# Patient Record
Sex: Male | Born: 1935
Health system: Southern US, Community
[De-identification: ages and names within clinical notes are randomized; demographics above are authoritative.]

## PROBLEM LIST (undated history)

## (undated) DIAGNOSIS — I4891 Unspecified atrial fibrillation: Secondary | ICD-10-CM

## (undated) DIAGNOSIS — M1990 Unspecified osteoarthritis, unspecified site: Secondary | ICD-10-CM

## (undated) DIAGNOSIS — G629 Polyneuropathy, unspecified: Secondary | ICD-10-CM

## (undated) DIAGNOSIS — R0602 Shortness of breath: Secondary | ICD-10-CM

## (undated) DIAGNOSIS — R079 Chest pain, unspecified: Secondary | ICD-10-CM

## (undated) DIAGNOSIS — I214 Non-ST elevation (NSTEMI) myocardial infarction: Secondary | ICD-10-CM

## (undated) DIAGNOSIS — C3490 Malignant neoplasm of unspecified part of unspecified bronchus or lung: Secondary | ICD-10-CM

## (undated) DIAGNOSIS — I219 Acute myocardial infarction, unspecified: Secondary | ICD-10-CM

## (undated) DIAGNOSIS — I2510 Atherosclerotic heart disease of native coronary artery without angina pectoris: Secondary | ICD-10-CM

## (undated) DIAGNOSIS — E538 Deficiency of other specified B group vitamins: Secondary | ICD-10-CM

## (undated) DIAGNOSIS — E785 Hyperlipidemia, unspecified: Secondary | ICD-10-CM

## (undated) DIAGNOSIS — N189 Chronic kidney disease, unspecified: Secondary | ICD-10-CM

## (undated) DIAGNOSIS — N400 Enlarged prostate without lower urinary tract symptoms: Secondary | ICD-10-CM

## (undated) DIAGNOSIS — I10 Essential (primary) hypertension: Secondary | ICD-10-CM

## (undated) DIAGNOSIS — C801 Malignant (primary) neoplasm, unspecified: Secondary | ICD-10-CM

## (undated) DIAGNOSIS — R42 Dizziness and giddiness: Secondary | ICD-10-CM

## (undated) DIAGNOSIS — I739 Peripheral vascular disease, unspecified: Secondary | ICD-10-CM

## (undated) HISTORY — DX: Dizziness and giddiness: R42

## (undated) HISTORY — DX: Unspecified atrial fibrillation: I48.91

## (undated) HISTORY — DX: Deficiency of other specified B group vitamins: E53.8

## (undated) HISTORY — PX: HERNIA REPAIR: SHX51

## (undated) HISTORY — DX: Malignant neoplasm of unspecified part of unspecified bronchus or lung: C34.90

## (undated) HISTORY — DX: Atherosclerotic heart disease of native coronary artery without angina pectoris: I25.10

## (undated) HISTORY — DX: Polyneuropathy, unspecified: G62.9

## (undated) HISTORY — DX: Chest pain, unspecified: R07.9

## (undated) HISTORY — DX: Benign prostatic hyperplasia without lower urinary tract symptoms: N40.0

## (undated) HISTORY — PX: COLON SURGERY: SHX602

## (undated) HISTORY — DX: Malignant (primary) neoplasm, unspecified: C80.1

## (undated) HISTORY — PX: CORONARY ANGIOPLASTY: SHX604

## (undated) HISTORY — DX: Hyperlipidemia, unspecified: E78.5

## (undated) HISTORY — DX: Chronic kidney disease, unspecified: N18.9

---

## 1983-07-11 HISTORY — PX: SINUS SURGERY WITH INSTATRAK: SHX5215

## 2005-09-07 ENCOUNTER — Inpatient Hospital Stay (HOSPITAL_COMMUNITY): Admission: AD | Admit: 2005-09-07 | Discharge: 2005-09-12 | Payer: Self-pay | Admitting: Cardiology

## 2005-09-07 ENCOUNTER — Ambulatory Visit: Payer: Self-pay | Admitting: Cardiology

## 2005-09-27 ENCOUNTER — Ambulatory Visit: Payer: Self-pay | Admitting: Cardiology

## 2005-10-16 ENCOUNTER — Ambulatory Visit: Payer: Self-pay | Admitting: Cardiology

## 2006-06-13 ENCOUNTER — Ambulatory Visit: Payer: Self-pay | Admitting: Cardiology

## 2006-06-20 ENCOUNTER — Ambulatory Visit: Payer: Self-pay

## 2006-08-28 ENCOUNTER — Ambulatory Visit: Payer: Self-pay | Admitting: Cardiology

## 2006-12-24 ENCOUNTER — Ambulatory Visit: Payer: Self-pay | Admitting: Cardiology

## 2007-02-13 ENCOUNTER — Ambulatory Visit: Payer: Self-pay | Admitting: Cardiology

## 2007-04-23 ENCOUNTER — Ambulatory Visit: Payer: Self-pay | Admitting: Cardiology

## 2007-08-28 ENCOUNTER — Ambulatory Visit: Payer: Self-pay | Admitting: Cardiology

## 2007-08-28 ENCOUNTER — Ambulatory Visit: Payer: Self-pay | Admitting: Internal Medicine

## 2007-12-19 ENCOUNTER — Ambulatory Visit: Payer: Self-pay | Admitting: Cardiology

## 2008-04-21 ENCOUNTER — Ambulatory Visit: Payer: Self-pay | Admitting: Cardiology

## 2008-09-02 ENCOUNTER — Ambulatory Visit: Payer: Self-pay | Admitting: Cardiology

## 2008-09-02 ENCOUNTER — Encounter: Payer: Self-pay | Admitting: Physician Assistant

## 2008-10-05 DIAGNOSIS — R079 Chest pain, unspecified: Secondary | ICD-10-CM

## 2008-10-05 DIAGNOSIS — R42 Dizziness and giddiness: Secondary | ICD-10-CM

## 2008-10-05 DIAGNOSIS — I2510 Atherosclerotic heart disease of native coronary artery without angina pectoris: Secondary | ICD-10-CM

## 2008-10-23 ENCOUNTER — Ambulatory Visit: Payer: Self-pay | Admitting: Cardiology

## 2008-11-27 ENCOUNTER — Ambulatory Visit: Payer: Self-pay | Admitting: Cardiology

## 2008-11-27 DIAGNOSIS — I10 Essential (primary) hypertension: Secondary | ICD-10-CM

## 2008-12-14 ENCOUNTER — Ambulatory Visit: Payer: Self-pay | Admitting: Cardiology

## 2008-12-16 ENCOUNTER — Encounter: Payer: Self-pay | Admitting: Cardiology

## 2008-12-16 LAB — CONVERTED CEMR LAB
Albumin: 3.9 g/dL (ref 3.5–5.2)
BUN: 29 mg/dL — ABNORMAL HIGH (ref 6–23)
Bilirubin, Direct: 0 mg/dL (ref 0.0–0.3)
CO2: 28 meq/L (ref 19–32)
Calcium: 9 mg/dL (ref 8.4–10.5)
Chloride: 108 meq/L (ref 96–112)
Cholesterol: 145 mg/dL (ref 0–200)
GFR calc non Af Amer: 56.89 mL/min (ref >60)
HDL: 39 mg/dL — ABNORMAL LOW (ref >39.00)
Hgb A1c MFr Bld: 6 % (ref 4.6–6.5)
LDL Cholesterol: 87 mg/dL (ref 0–99)
Total Protein: 7.3 g/dL (ref 6.0–8.3)
Triglycerides: 97 mg/dL (ref 0.0–149.0)
VLDL: 19.4 mg/dL (ref 0.0–40.0)

## 2009-01-06 ENCOUNTER — Ambulatory Visit: Payer: Self-pay | Admitting: Cardiology

## 2009-05-06 ENCOUNTER — Ambulatory Visit: Payer: Self-pay | Admitting: Cardiology

## 2009-06-11 ENCOUNTER — Encounter (INDEPENDENT_AMBULATORY_CARE_PROVIDER_SITE_OTHER): Payer: Self-pay | Admitting: *Deleted

## 2009-09-01 ENCOUNTER — Ambulatory Visit: Payer: Self-pay | Admitting: Cardiology

## 2009-11-09 ENCOUNTER — Ambulatory Visit: Payer: Self-pay | Admitting: Cardiology

## 2009-11-09 ENCOUNTER — Encounter (INDEPENDENT_AMBULATORY_CARE_PROVIDER_SITE_OTHER): Payer: Self-pay | Admitting: *Deleted

## 2009-11-09 DIAGNOSIS — I447 Left bundle-branch block, unspecified: Secondary | ICD-10-CM | POA: Insufficient documentation

## 2009-11-09 DIAGNOSIS — E785 Hyperlipidemia, unspecified: Secondary | ICD-10-CM | POA: Insufficient documentation

## 2009-11-10 LAB — CONVERTED CEMR LAB
ALT: 16 units/L (ref 0–53)
Albumin: 4.1 g/dL (ref 3.5–5.2)
BUN: 33 mg/dL — ABNORMAL HIGH (ref 6–23)
Basophils Relative: 0.4 % (ref 0.0–3.0)
Bilirubin, Direct: 0.1 mg/dL (ref 0.0–0.3)
CO2: 32 meq/L (ref 19–32)
Chloride: 103 meq/L (ref 96–112)
Cholesterol: 139 mg/dL (ref 0–200)
Creatinine, Ser: 1.8 mg/dL — ABNORMAL HIGH (ref 0.4–1.5)
Eosinophils Relative: 3.2 % (ref 0.0–5.0)
HCT: 45.3 % (ref 39.0–52.0)
HDL: 42.4 mg/dL (ref >39.00)
Hemoglobin: 15.4 g/dL (ref 13.0–17.0)
Lymphs Abs: 1.7 10*3/uL (ref 0.7–4.0)
MCHC: 34 g/dL (ref 30.0–36.0)
MCV: 86.1 fL (ref 78.0–100.0)
Monocytes Absolute: 0.6 10*3/uL (ref 0.1–1.0)
Monocytes Relative: 7.7 % (ref 3.0–12.0)
Neutro Abs: 5.3 10*3/uL (ref 1.4–7.7)
Neutrophils Relative %: 67.5 % (ref 43.0–77.0)
RDW: 13.1 % (ref 11.5–14.6)
Total Protein: 8 g/dL (ref 6.0–8.3)
Triglycerides: 95 mg/dL (ref 0.0–149.0)
VLDL: 19 mg/dL (ref 0.0–40.0)

## 2009-11-24 ENCOUNTER — Ambulatory Visit: Payer: Self-pay | Admitting: Cardiology

## 2009-11-25 ENCOUNTER — Telehealth (INDEPENDENT_AMBULATORY_CARE_PROVIDER_SITE_OTHER): Payer: Self-pay | Admitting: *Deleted

## 2009-11-25 LAB — CONVERTED CEMR LAB
BUN: 30 mg/dL — ABNORMAL HIGH (ref 6–23)
CO2: 29 meq/L (ref 19–32)
Chloride: 105 meq/L (ref 96–112)
Sodium: 141 meq/L (ref 135–145)

## 2009-11-29 ENCOUNTER — Encounter (HOSPITAL_COMMUNITY): Admission: RE | Admit: 2009-11-29 | Discharge: 2010-02-09 | Payer: Self-pay | Admitting: Cardiology

## 2009-11-29 ENCOUNTER — Ambulatory Visit: Payer: Self-pay | Admitting: Cardiology

## 2009-11-29 ENCOUNTER — Ambulatory Visit: Payer: Self-pay

## 2009-12-03 ENCOUNTER — Telehealth: Payer: Self-pay | Admitting: Cardiology

## 2010-01-05 ENCOUNTER — Ambulatory Visit: Payer: Self-pay | Admitting: Cardiology

## 2010-05-05 ENCOUNTER — Ambulatory Visit: Payer: Self-pay | Admitting: Cardiology

## 2010-08-11 NOTE — Assessment & Plan Note (Signed)
Summary: Cardiology Nuclear Study  Nuclear Med Background Indications for Stress Test: Evaluation for Ischemia, Stent Patency   History: Heart Catheterization, Myocardial Perfusion Study, Stents  History Comments: 3/07 Heart cath: EF= 55-60%, residual 50% RCA > Stents: LAD, CFX 12/07 MPS: EF= 57%, Inf. thinning, (-) ischemia     Nuclear Pre-Procedure Cardiac Risk Factors: Family History - CAD, History of Smoking, Hypertension, LBBB, Lipids Caffeine/Decaff Intake: none NPO After: 6:00 PM Lungs: clear IV 0.9% NS with Angio Cath: 18g     IV Site: (R) AC IV Started by: Stanton Kidney EMT-P Chest Size (in) 44     Height (in): 71 Weight (lb): 186 BMI: 26.04 Tech Comments: held metoprolol this am, per Patient.  Nuclear Med Study 1 or 2 day study:  1 day     Stress Test Type:  Adenosine Reading MD:  Willa Rough, MD     Resting Radionuclide:  Technetium 74m Tetrofosmin     Resting Radionuclide Dose:  10.7 mCi  Stress Radionuclide:  Technetium 37m Tetrofosmin     Stress Radionuclide Dose:  33.0 mCi   Stress Protocol  Dose of Adenosine:  47.3 mg    Stress Test Technologist:  Milana Na EMT-P     Nuclear Technologist:  Domenic Polite CNMT  Rest Procedure  Myocardial perfusion imaging was performed at rest 45 minutes following the intravenous administration of Myoview Technetium 1m Tetrofosmin.  Stress Procedure  The patient received IV adenosine at 140 mcg/kg/min for 4 minutes. There were no significant changes with infusion. Myoview was injected at the 2 minute mark and quantitative spect images were obtained after a 45 minute delay.  QPS Raw Data Images:  Patient motion noted; appropriate software correction applied. Stress Images:  No diagnostic abnormalities Rest Images:  Same as stress Subtraction (SDS):  No evidence of ischemia. Transient Ischemic Dilatation:  1.08  (Normal <1.22)  Lung/Heart Ratio:  .34  (Normal <0.45)  Quantitative Gated Spect Images QGS EDV:   119 ml QGS ESV:  55 ml QGS EF:  54 % QGS cine images:  Mild septal dyssynergy from LBBB  Findings Normal nuclear study      Overall Impression  Exercise Capacity: Adenosine study with no exercise. BP Response: Normal blood pressure response. Clinical Symptoms: abdominal cramp ECG Impression: LBBB Overall Impression: Normal stress nuclear study. Overall Impression Comments: No scar or ischemia.  Appended Document: Cardiology Nuclear Study excellent results, no change in treatment.  Appended Document: Cardiology Nuclear Study The referring doctor for this patient is T. Wall. This entry was excluded.  Appended Document: Cardiology Nuclear Study LMTCB./CY

## 2010-08-11 NOTE — Miscellaneous (Signed)
  Clinical Lists Changes  Problems: Added new problem of SCREENING FOR DIABETES MELLITUS (ICD-V77.1) Orders: Added new Test order of TLB-A1C / Hgb A1C (Glycohemoglobin) (83036-A1C) - Signed Added new Test order of TLB-BMP (Basic Metabolic Panel-BMET) (80048-METABOL) - Signed Added new Test order of TLB-CBC Platelet - w/Differential (85025-CBCD) - Signed Added new Test order of TLB-Hepatic/Liver Function Pnl (80076-HEPATIC) - Signed Added new Test order of TLB-Lipid Panel (80061-LIPID) - Signed

## 2010-08-11 NOTE — Miscellaneous (Signed)
  Clinical Lists Changes  Orders: Added new Test order of TLB-BMP (Basic Metabolic Panel-BMET) (80048-METABOL) - Signed Added new Test order of TLB-CBC Platelet - w/Differential (85025-CBCD) - Signed Added new Test order of TLB-Hepatic/Liver Function Pnl (80076-HEPATIC) - Signed Added new Test order of TLB-Lipid Panel (80061-LIPID) - Signed Added new Test order of TLB-A1C / Hgb A1C (Glycohemoglobin) (83036-A1C) - Signed

## 2010-08-11 NOTE — Miscellaneous (Signed)
Summary: Orders Update  Clinical Lists Changes  Orders: Added new Test order of TLB-A1C / Hgb A1C (Glycohemoglobin) (83036-A1C) - Signed Added new Test order of TLB-BMP (Basic Metabolic Panel-BMET) (80048-METABOL) - Signed Added new Test order of TLB-CBC Platelet - w/Differential (85025-CBCD) - Signed Added new Test order of TLB-Hepatic/Liver Function Pnl (80076-HEPATIC) - Signed Added new Test order of TLB-Lipid Panel (80061-LIPID) - Signed

## 2010-08-11 NOTE — Miscellaneous (Signed)
  Clinical Lists Changes  Orders: Added new Referral order of Nuclear Stress Test (Nuc Stress Test) - Signed Added new Service order of EKG w/ Interpretation (93000) - Signed Observations: Added new observation of PI CARDIO: Your physician recommends that you schedule a follow-up appointment in: YEAR WITH DR Janziel Hockett DUE 11/2010 Your physician recommends that you return for lab work in:DAY OF LEXISCAN FASTING BMET LIPID LIVER CBC HGBA1C 272.4 V58.69 Your physician recommends that you continue on your current medications as directed. Please refer to the Current Medication list given to you today. Your physician has requested that you have an LEXISCAN myoview.  For further information please visit [email or web address]  Please follow instruction sheet, as given. (11/09/2009 10:27)      Patient Instructions: 1)  Your physician recommends that you schedule a follow-up appointment in: YEAR WITH DR Debrah Granderson DUE 11/2010 2)  Your physician recommends that you return for lab work in:DAY OF LEXISCAN FASTING BMET LIPID LIVER CBC HGBA1C 272.4 V58.69 3)  Your physician recommends that you continue on your current medications as directed. Please refer to the Current Medication list given to you today. 4)  Your physician has requested that you have an LEXISCAN myoview.  For further information please visit [email or web address]  Please follow instruction sheet, as given.

## 2010-08-11 NOTE — Assessment & Plan Note (Signed)
Summary: CAD/ANAS      Allergies Added: NKDA  Visit Type:  1 yr f/u Primary Provider:  No PCP at this time  CC:  no cardiac complaints today.  History of Present Illness: [name] returns today for evaluation and management of his coronary artery disease, history of PCI of his LAD and circumflex, hypertension, and next hyperlipidemia. He's had a history of some mild glucose intolerance in the past as well but his hemoglobin A1c last year was normal.  He denies any symptoms of angina or dose prior to his PCI in 2007. He is safe with his medications. He does the best he can with diet.  Denies orthopnea, PND or peripheral edema.  Blood work last year showed a hemoglobin A1c of 6%, normal creatinine, cholesterol 145, triglycerides of 97, HDL 39, LDL 87. His liver functions were normal.  His last stress Myoview was in December 2007 after his intervention earlier that year. He is a chronic left bundle.  Clinical Reports Reviewed:  Cardiac Cath:  09/11/2005: Cardiac Cath Findings:   COMPLICATIONS:  None.   IMPRESSION/RECOMMENDATIONS:  Successful revascularization of both the left  anterior descending artery and circumflex using a total of four drug-eluting  stent:  The patient should be maintained on a combination of aspirin and  Plavix for life.   Salvadore Farber, M.D. [facility]  Electronically Signed Cardiac Cath Findings:  PROCEDURE:  Drug-eluting stent placement in both the proximal and mid  circumflex, drug-eluting stent placement in the mid left anterior  descending, intravascular ultrasound of both the circumflex and left  anterior descending, Star Close closure of the right common carotid  arteriotomy site.   INDICATION:  [name] is a 77 year old gentleman, essentially without  past medical history,   Dictation ended at this point.   Salvadore Farber, M.D. [facility]  09/08/2005: Cardiac Cath Findings:  ASSESSMENT:  1.  Three-vessel coronary disease with high-grade  lesions in the left      circumflex and left anterior descending artery.  2.  Normal ejection fraction.   PLAN/DISCUSSION:  I have reviewed the films with Dr. Riley Kill. We will plan  for a two-vessel PCI on Monday. He will need to continue on heparin and  nitroglycerin over the weekend and also need aggressive risk factor  modification.   Arvilla Meres, M.D. [facility]  Electronically Signed  Nuclear Study:  06/20/2006:  Excerise capacity: Adenosine study with no exercise  Blood Pressure response:  Clinical symptoms:  ECG impression: LBBB with rest and stress  Overall impression: Normal stress nuclear study. There is a mild fixed defect which is most consistent with diaphragmatic attenuation. No ischemia. Normal LV fucntion with a mildly dilated ventricle.  Arvilla Meres, MD    Current Medications (verified): 1)  Aspirin Ec 325 Mg Tbec (Aspirin) .... Take One Tablet By Mouth Daily 2)  Plavix 75 Mg Tabs (Clopidogrel Bisulfate) .... Take One Tablet By Mouth Daily 3)  Lisinopril 40 Mg Tabs (Lisinopril) .... Take One Tablet By Mouth Daily 4)  Metoprolol Succinate 50 Mg Xr24h-Tab (Metoprolol Succinate) .... Take One Tablet By Mouth Daily 5)  Nitroglycerin 0.4 Mg Subl (Nitroglycerin) .... One Tablet Under Tongue Every 5 Minutes As Needed For Chest Pain---May Repeat Times Three 6)  Study Drug .... Take As Directed  Allergies (verified): No Known Drug Allergies  Past History:  Past Medical History: Last updated: 10/05/2008 CAD, UNSPECIFIED SITE (ICD-414.00) DIZZINESS (ICD-780.4) CHEST PAIN-UNSPECIFIED (ICD-786.50)  Past Surgical History: Last updated: 10/05/2008 sinus surgery 1985  Family  History: Last updated: 10/05/2008 Mother: died in 62...brain tumor  Father: died 44..MI at age 78  Social History: Last updated: 10/05/2008 Full Time.Johnanna Schne[employer] of the [employer] Single  Tobacco Use - Former. ..quit 1983 Alcohol Use - no Regular Exercise -  yes 3 children  Risk Factors: Exercise: yes (10/05/2008)  Risk Factors: Smoking Status: quit (10/05/2008)  Review of Systems       negative other than history of present illness  Vital Signs:  Patient profile:   77 year old male Height:      71 inches Weight:      185 pounds BMI:     25.90 Pulse rate:   58 / minute Pulse rhythm:   irregular BP sitting:   120 / 70  (left arm) Cuff size:   large  Vitals Entered By: Danielle Rankin, CMA (Nov 09, 2009 9:53 AM)  Physical Exam  General:  Well developed, well nourished, in no acute distress. Head:  normocephalic and atraumatic Eyes:  PERRLA/EOM intact; conjunctiva and lids normal. Neck:  Neck supple, no JVD. No masses, thyromegaly or abnormal cervical nodes. Chest [name]:  no deformities or breast masses noted Lungs:  Clear bilaterally to auscultation and percussion. Heart:  normal PMI, normal S1 paradoxical split S2, regular rate and rhythm Abdomen:  Bowel sounds positive; abdomen soft and non-tender without masses, organomegaly, or hernias noted. No hepatosplenomegaly. Msk:  Back normal, normal gait. Muscle strength and tone normal. Pulses:  pulses normal in all 4 extremities Extremities:  No clubbing or cyanosis. Neurologic:  Alert and oriented x 3. Skin:  Intact without lesions or rashes. Psych:  Normal affect.   Problems:  Medical Problems Added: 1)  Dx of Lbbb  (ICD-426.3) 2)  Dx of Hyperlipidemia-mixed  (ICD-272.4)  EKG  Procedure date:  11/09/2009  Findings:      sinus bradycardia, left bundle branch block  Impression & Recommendations:  Problem # 1:  CAD, UNSPECIFIED SITE (ICD-414.00) I will obtain an adenosine or Lexi scan Myoview. If negative for ischemia, see him back in a year. His updated medication list for this problem includes:    Aspirin Ec 325 Mg Tbec (Aspirin) .Marland Kitchen... Take one tablet by mouth daily    Plavix 75 Mg Tabs (Clopidogrel bisulfate) .Marland Kitchen... Take one tablet by mouth daily    Lisinopril 40 Mg  Tabs (Lisinopril) .Marland Kitchen... Take one tablet by mouth daily    Metoprolol Succinate 50 Mg Xr24h-tab (Metoprolol succinate) .Marland Kitchen... Take one tablet by mouth daily    Nitroglycerin 0.4 Mg Subl (Nitroglycerin) ..... One tablet under tongue every 5 minutes as needed for chest pain---may repeat times three  Orders: EKG w/ Interpretation (93000)  Problem # 2:  HYPERTENSION, BENIGN (ICD-401.1) Assessment: Improved  His updated medication list for this problem includes:    Aspirin Ec 325 Mg Tbec (Aspirin) .Marland Kitchen... Take one tablet by mouth daily    Lisinopril 40 Mg Tabs (Lisinopril) .Marland Kitchen... Take one tablet by mouth daily    Metoprolol Succinate 50 Mg Xr24h-tab (Metoprolol succinate) .Marland Kitchen... Take one tablet by mouth daily  Problem # 3:  HYPERLIPIDEMIA-MIXED (ICD-272.4) Assessment: Unchanged We will check a fasting lipid panel and comprehensive metabolic panel as well as the hemoglobin A1c.  Problem # 4:  LBBB (ICD-426.3) Assessment: Unchanged  His updated medication list for this problem includes:    Aspirin Ec 325 Mg Tbec (Aspirin) .Marland Kitchen... Take one tablet by mouth daily    Plavix 75 Mg Tabs (Clopidogrel bisulfate) .Marland Kitchen... Take one tablet by  mouth daily    Lisinopril 40 Mg Tabs (Lisinopril) .Marland Kitchen... Take one tablet by mouth daily    Metoprolol Succinate 50 Mg Xr24h-tab (Metoprolol succinate) .Marland Kitchen... Take one tablet by mouth daily    Nitroglycerin 0.4 Mg Subl (Nitroglycerin) ..... One tablet under tongue every 5 minutes as needed for chest pain---may repeat times three  Patient Instructions: 1)  Your physician recommends that you schedule a follow-up appointment in:  2)  Your physician recommends that you continue on your current medications as directed. Please refer to the Current Medication list given to you today.

## 2010-08-11 NOTE — Progress Notes (Signed)
Summary: returning call   Phone Note Call from Patient Call back at Home Phone [phone]   Caller: Patient Reason for Call: Talk to Nurse Summary of Call: returning call, aware nurse is not in till 6/1, will wait till then per pts request Initial call taken by: Migdalia Dk,  Dec 03, 2009 3:52 PM  Follow-up for Phone Call        [facility] Scherrie Bateman, LPN  December 09, 979 9:51 AM Pt returning call  Judie Grieve  December 08, 2009 1:44 PM  Additional Follow-up for Phone Call Additional follow up Details #1::        Phone Call Completed. PT AWARE OF MYOVIEW RESULTS. Additional Follow-up by: Scherrie Bateman, LPN,  December 09, 1912 8:14 AM

## 2010-08-11 NOTE — Progress Notes (Signed)
Summary: Nuclear Pre-Procedure  Phone Note Outgoing Call Call back at [facility] Phone [phone]   Call placed by: [name], EMT-P,  Nov 25, 2009 3:08 PM Call placed to: Patient Action Taken: Phone Call Completed Summary of Call: Reviewed information on Myoview Information Sheet (see scanned document for further details).  Spoke with Patient.    Nuclear Med Background Indications for Stress Test: Evaluation for Ischemia, Stent Patency   History: Heart Catheterization, Myocardial Perfusion Study, Stents  History Comments: 3/07 Heart cath: EF= 55-60%, residual 50% RCA > Stents: LAD, CFX 12/07 MPS: EF= 57%, Inf. thinning, (-) ischemia     Nuclear Pre-Procedure Cardiac Risk Factors: Family History - CAD, History of Smoking, Hypertension, LBBB, Lipids Height (in): 71

## 2010-08-30 ENCOUNTER — Encounter (INDEPENDENT_AMBULATORY_CARE_PROVIDER_SITE_OTHER): Payer: Medicare Other

## 2010-08-30 DIAGNOSIS — R0989 Other specified symptoms and signs involving the circulatory and respiratory systems: Secondary | ICD-10-CM

## 2010-11-25 NOTE — Assessment & Plan Note (Signed)
[facility]                            CARDIOLOGY OFFICE NOTE   NAME:[name], [name]                      MRN:          [id number]  DATE:08/28/2006                            DOB:          09/20/1929    Mr. Lia returns today for further management of his coronary artery  disease.   He came in today as well for [facility] in the  IMPROVE-IT study.   He has been having a little bit of dizziness when he stands up or when  he changes position too quick.  In addition, some of his friends have  noticed he has not been quite as gung-ho.   He had a stress Myoview June 20, 2006, which showed an ejection  fraction of 57%.  He had very minimal fixed inferior wall defect, most  consistent with diaphragmatic attenuation.  He had a mildly dilated  ventricle.   His blood pressure today is 130/70, pulse is 55, and he has a left  bundle, which is stable.  His weight is 187.  HEENT:  Normocephalic, atraumatic.  PERRLA.  Extraocular muscles are  intact.  Sclerae clear.  Facial symmetry is normal.  Carotid upstrokes are equal bilaterally without bruits.  There is no  JVD.  Thyroid is not enlarged.  Trachea is midline.  LUNGS:  Clear.  HEART:  Regular rate and rhythm.  There is a paradoxically split S2.  There is no S3 gallop.  ABDOMEN:  Soft with good bowel sounds.  EXTREMITIES:  No cyanosis, clubbing, or edema.  Pulses are intact.  NEURO:  Intact.   EKG confirms sinus brady with a left bundle with a rate of 53 beats per  minute.   ASSESSMENT AND PLAN:  Mr. Frieden is doing well.  I am concerned that he  may be a little bradycardic, and also maybe a little orthostatic.  I  have decreased his Toprol XL to 50 mg a day.  We will plan on seeing him  back in December, 2008.     Thomas C. Daleen Squibb, MD, [facility]  Electronically Signed    TCW/MedQ  DD: 08/28/2006  DT: 08/28/2006  Job #: [id number]

## 2010-11-25 NOTE — Assessment & Plan Note (Signed)
[facility]                            CARDIOLOGY OFFICE NOTE   NAME:[name], [name]                      MRN:          [id number]  DATE:06/13/2006                            DOB:          [birth year]    [name] returns today for further management of his coronary artery  disease. He has been doing remarkably well without any symptoms of  coronary ischemia. He has been active hunting going down to [facility].  He brings pictures today!   He is in the IMPROVE IT study. He has been followed closely by the  research foundation.   His blood pressure has been checked on several occasions and been 120-  130 systolic. He is on lisinopril from our last visit.   He has lost a total of 25 pounds since his index infarct in March 2007.  His hemoglobin A1c was 5.8% in August.   MEDICATIONS:  1. Aspirin 325 a day.  2. Plavix 75 mg a day.  3. Toprol XL 100 mg a day.  4. IMPROVE IT study.  5. Lisinopril 20 mg a day.   VITAL SIGNS:  His blood pressure is 140/68, his pulse 59 and regular,  his weight is 183.  HEENT:  Normocephalic, atraumatic. PERRLA. Extraocular movements intact.  Sclera clear.  LUNGS:  Clear to auscultation and percussion.  HEART:  Reveals a regular rate and rhythm without murmurs, rubs or  gallops.  ABDOMEN:  Soft with good bowel sounds. There is no midline bruit, there  is no hepatomegaly.  EXTREMITIES:  Reveal no cyanosis, clubbing or edema. Pulses are intact.   Electrocardiogram  shows sinus bradycardia at a rate of 59 beats per  minute. He has a left bundle which is old.   I had a nice chat with [name]. He is doing remarkably well. Will  arrange for him to have an exercise rest stress Myoview in January per  his schedule off of Toprol. If this is negative for ischemia, I will see  him back in March which will be a year out from his infarct. At that  time, we will probably stop his Plavix.     Thomas C. Daleen Squibb, MD, [facility]  Electronically Signed   TCW/MedQ  DD: 06/13/2006  DT: 06/14/2006  Job #: [id number]   cc:   [facility]

## 2010-11-25 NOTE — Cardiovascular Report (Signed)
[name], [name]               ACCOUNT NO.:  [id number]   MEDICAL RECORD NO.:  [id number]          PATIENT TYPE:  INP   LOCATION:  2099                         FACILITY:  [name]   PHYSICIAN:  Salvadore Farber, M.D. LHCDATE OF BIRTH:  [birth year]   DATE OF PROCEDURE:  09/11/2005  DATE OF DISCHARGE:                              CARDIAC CATHETERIZATION   PROCEDURE:  Drug-eluting stent placement in both the proximal and mid  circumflex, drug-eluting stent placement in the mid left anterior  descending, intravascular ultrasound of both the circumflex and left  anterior descending, Star Close closure of the right common carotid  arteriotomy site.   INDICATION:  Mr. Maish is a 76 year old gentleman, essentially without  past medical history,   Dictation ended at this point.      Salvadore Farber, M.D. [facility]     WED/MEDQ  D:  09/11/2005  T:  09/12/2005  Job:  [id number]

## 2010-11-25 NOTE — H&P (Signed)
NAME:  [name], [name]               ACCOUNT NO.:  [id number]   MEDICAL RECORD NO.:  [id number]            PATIENT TYPE:   LOCATION:                                 FACILITY:   PHYSICIAN:  Thomas C. Wall, M.D.        DATE OF BIRTH:   DATE OF ADMISSION:  09/07/2005  DATE OF DISCHARGE:                                HISTORY & PHYSICAL   CHIEF COMPLAINT:  Chest tightness with exertion and for a long period of  time over the last week.   HISTORY OF PRESENT ILLNESS:  [name] is a 77 year old white male who has  not seen a doctor in a number of years.   He has enjoyed excellent health.  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   Last week while getting ready for an extravaganza at [facility] he began to  have exertional chest tightness and pressure with some aching in his arm.  Sometimes he would have it for long periods of time, but low-grade.  It  usually got better with rest.   PAST MEDICAL HISTORY:  No known drug allergies.  He quit smoking in 12/06/1981.  He does not drink.   He is on no medications.   He has had sinus surgery in Dec 07, 1983.   His father died of a heart attack in 89 at age 35.   SOCIAL HISTORY:  He is single.  He has three children.  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  He is extremely active.   REVIEW OF SYSTEMS:  Negative.   PHYSICAL EXAMINATION:  VITAL SIGNS:  Blood pressure 156/84, pulse 82 and  regular.  His weight is 195.  He is 5 feet 11 inches.  HEENT:  He has some mild arcus senilis.  PERRLA.  Extraocular movements  intact.  Facial symmetry is normal.  Dentition is stable.  Carotid upstrokes  are equal bilaterally without bruits.  No JVD.  Thyroid is not enlarged.  Trachea is midline.  LUNGS:  Clear.  SKIN:  He has a keloid over his sternum from a previous scar.  CARDIAC:  Normal S1, S2 with a paradoxical split S2.  ABDOMEN:  Soft.  There is no midline bruit.  There is  liver edge palpable  with deep inspiration, but no tenderness.  There is no splenomegaly.  EXTREMITIES:  No clubbing, cyanosis, edema.  Pulses are brisk.  NEUROLOGIC:  Intact.   EKG shows sinus rhythm with a left bundle.   ASSESSMENT:  1.  Unstable angina versus a non Q-wave infarction.  2.  Hypertension.   PLAN:  1.  Admit to [facility] to telemetry or stepdown.  2.  PA and lateral chest x-ray.  3.  Cardiac enzymes x3.  4.  IV heparin, beta blockade, aspirin, and low-dose IV nitroglycerin.  5.  Check fasting lipids and other blood work.  6.  Cardiac catheterization tomorrow.  I have discussed this with the      patient and he agrees with the plan to proceed.  Thomas C. Wall, M.D.  Electronically Signed     TCW/MEDQ  D:  09/07/2005  T:  09/07/2005  Job:  [id number]

## 2010-11-25 NOTE — Cardiovascular Report (Signed)
NAMEKUTTER, SCHNEPF               ACCOUNT NO.:  [id number]   MEDICAL RECORD NO.:  [id number]          PATIENT TYPE:  INP   LOCATION:  2029                         FACILITY:  [name]   PHYSICIAN:  Arvilla Meres, M.D. [facility] OF BIRTH:  [birth year]   DATE OF PROCEDURE:  09/08/2005  DATE OF DISCHARGE:                              CARDIAC CATHETERIZATION   CARDIOLOGIST:  Dr. Juanito Doom.   PATIENT IDENTIFICATION:  [name] is a 77 year old male, with a little  past medical history, who experienced an episode of severe substernal chest  pain this past Saturday which persisted for about the 18-24 hours before  resolving. Over the past week, he has had intermittent chest discomfort with  exertion. He was thus admitted and referred for cardiac catheterization. His  troponin was mildly positive about 0.8.   DESCRIPTION OF PROCEDURE:  Risks and benefits of catheterization were  explained to [name]; consent was signed and placed on the chart. A 6-  [place] arterial sheath was placed in right femoral artery using a modified  Seldinger technique. Standard catheters including preformed Judkins JL-4, JR-  4 and angled pigtail were used for procedure. All catheter exchanges were  made over wire. There were no apparent complications. At the end of the  procedure, the patient's arteriotomy site was closed with Angio-Seal closure  device. There was good hemostasis.   PROCEDURES PERFORMED:  1.  Selective coronary angiography.  2.  Left heart cath.  3.  Left ventriculogram.  4.  AngioSeal femoral artery closure.   FINDINGS:  Central aortic pressure is 147/73 with a mean of 104. LV pressure  is 129/6 with an LVEDP of 11. There was no aortic stenosis.   Left main had a 20% stenosis.   LAD was a long vessel which wrapped the apex. It gave off a very large  branching first diagonal and a tiny second diagonal. The mid-LAD at the  takeoff of a small second diagonal and large septal perforator  there was  tubular stenosis which appeared to be 70-80%.   The left circumflex was a moderate-sized vessel. It gave off a moderate-  sized ramus, small to moderate OM-1 and a moderate-sized OM-2. There was a  99% stenosis in the mid-left circumflex prior to the takeoff of the OM-1. In  the proximal portion of the OM-2, there was a 90% stenosis. There was a 50%  stenosis in the distal OM-2.   Right coronary was a large dominant vessel. It gave off a large PDA, one  small PL, and a moderate-sized PL. There was a 50% tubular lesion in the mid-  portion. There was a 30% lesion around the distal bend.   Left ventriculogram done in the RAO position showed an EF of 55-60% with a  question of mild anterior hypokinesis. In the lateral projection, the EF was  60% with no evidence of a lateral wall motion abnormality.   ASSESSMENT:  1.  Three-vessel coronary disease with high-grade lesions in the left      circumflex and left anterior descending artery.  2.  Normal ejection fraction.   PLAN/DISCUSSION:  I have reviewed the films with Dr. Riley Kill. We will plan  for a two-vessel PCI on Monday. He will need to continue on heparin and  nitroglycerin over the weekend and also need aggressive risk factor  modification.      Arvilla Meres, M.D. [facility]  Electronically Signed     DB/MEDQ  D:  09/08/2005  T:  09/10/2005  Job:  [id number]

## 2010-11-25 NOTE — Discharge Summary (Signed)
NAMEBENEN, WEIDA               ACCOUNT NO.:  [id number]   MEDICAL RECORD NO.:  [id number]          PATIENT TYPE:  INP   LOCATION:  6533                         FACILITY:  [name]   PHYSICIAN:  Salvadore Farber, M.D. [facility] OF BIRTH:  12-23-1929   DATE OF ADMISSION:  09/07/2005  DATE OF DISCHARGE:  09/12/2005                                 DISCHARGE SUMMARY   PROCEDURES:  1.  Cardiac catheterization.  2.  Coronary arteriogram.  3.  Left ventriculogram.   PRIMARY DIAGNOSIS:  Non-ST segment elevation myocardial infarction.   SECONDARY DIAGNOSES:  1.  Dyslipidemia with a total cholesterol of 162, triglycerides 78, HDL 31,      LDL 115.  2.  Status post sinus surgery.  3.  Family history of coronary artery disease.  4.  Hypertension.   HOSPITAL COURSE:  [name] is a 77 year old male with no recent medical  care and no recent medical problems.  He had some exertional chest tightness  last week and came to the emergency room, where he was evaluated by Dr. Daleen Squibb  and admitted for further evaluation and treatment.   A cardiac catheterization was performed on September 08, 2005, and it showed  three-vessel coronary artery disease with a 70-80% LAD, 99 and 90%  circumflex, 50% RCA.  His EF was 55-60% with mild anterior hypokinesis.  The  films were evaluated by Dr. Gala Romney and Dr. Riley Kill.  It was felt that he  needed percutaneous intervention to the circumflex and the LAD, and this was  scheduled for September 11, 2005.   Mr. Klahn remained on stable and on September 11, 2005, he had percutaneous  intervention with a drug-eluting stent to the proximal circumflex, two drug-  eluting stents to the midcircumflex and a drug-eluting stent to the LAD.  Intravascular ultrasound was also performed.  All stents were TAXUS stents.  He tolerated the procedure well.   On September 12, 2005, Mr. Balsam was evaluated by Dr. Samule Ohm.  Dr. Samule Ohm feels  that he should be on aspirin and Plavix indefinitely.   He was on Lopressor,  but this was changed to Toprol XL at 100 mg a day.  Because of his  hypercholesterolemia, he was enrolled in the IMPROVE IT study, and he will  be taking that medication.  Mr. Losito is considered stable for discharge  on September 12, 2005, with outpatient follow-up arranged.   DISCHARGE INSTRUCTIONS:  1.  His activity is to be increased gradually per cardiac rehab.  2.  He is to call our office for any problems with the catheterization site.  3.  He is not to drive for three days and not to lift anything for three      weeks.   DISCHARGE MEDICATIONS:  1.  IMPROVE IT study drug three tablets daily q.h.s.  2.  Aspirin 325 mg a day.  3.  Toprol XL 100 mg daily.  4.  Plavix 75 mg a day.  5.  Sublingual nitroglycerin p.r.n.      Theodore Demark, P.A. [facility]      Salvadore Farber, M.D. [facility]  Electronically Signed    RB/MEDQ  D:  09/12/2005  T:  09/13/2005  Job:  [id number]   cc:   Thomas C. Wall, M.D.  [address]  [address]  [place]  [place] [address]

## 2010-11-25 NOTE — Cardiovascular Report (Signed)
[name], [name]               ACCOUNT NO.:  [id number]   MEDICAL RECORD NO.:  [id number]          PATIENT TYPE:  INP   LOCATION:  6533                         FACILITY:  [name]   PHYSICIAN:  Salvadore Farber, M.D. [facility]ATE OF BIRTH:  01-Feb-1930   DATE OF PROCEDURE:  09/11/2005  DATE OF DISCHARGE:  09/12/2005                              CARDIAC CATHETERIZATION   PROCEDURE:  Drug-eluting stent placement in the lesion of the proximal  circumflex.  Placement of two overlapping drug-eluting stent in the lesion  of the mid-circumflex and drug-eluting stent placement in the mid-LAD.   INDICATIONS:  [name] is a 77 year old gentleman with no significant  past medical history who presents with non-ST elevation myocardial  infarction.  Troponin peaked at only 0.8.  He underwent diagnostic  angiography on September 08, 2005 by Dr. Arvilla Meres.  That demonstrated 99%  of the proximal circumflex, long 90% stenosis of the mid-circumflex and a  70% stenosis of the mid-LAD.  The patient was referred for percutaneous  revascularization.   PROCEDURAL TECHNIQUE:  Informed consent was obtained.  Under 1% lidocaine  local anesthesia, a 6-French was placed in the right common femoral artery  using the modified Seldinger technique.  Anticoagulation was initiated with  ReoPro and heparin.  ACT was maintained at greater than 200 seconds.   Attention was first turned to the circumflex.  A CLS 3.5 guide was advanced  over a wire and engaged in the ostium of the left main.  A Prowater wire was  advanced to the distal circumflex without difficulty.  I began by  predilating the proximal circumflex lesion with a 2.0 x 12 mm Maverick for  two inflations of 6 atmospheres.  I then advanced a 2.5 x 16 mm Taxus stent  to the mid-circumflex lesion and deployed it at 12 atmospheres.  I then took  a second 2.5 x 16 mm Taxus and deployed it in the proximal circumflex  lesion, again deploying it at 12  atmospheres.  I then gave intracoronary  nitroglycerin and then postdilated both stents using 2.5 x 15 mm PowerSail  at 14 atmospheres in the midstent and 16 atmospheres in the proximal stent.  There remained severe stenosis distal to the more distal stent.  I treated  this with an overlapping 2.5 x 12 mm Taxus stent, overlapping the distal  segment.  I deployed this at 10 atmospheres.  I then postdilated this using  a 2.5 x 12 mm Quantum at 14 atmospheres distally and 18 atmospheres at the  site of overlap.  Final angiography demonstrated no residual stenosis, no  dissection and TIMI III flow to the distal vasculature.   Attention was then turned to the LAD.  I removed the Prowater wire from the  circumflex and advanced to the distal LAD without difficulty.  To better  assess the severity of the LAD lesion, I performed intravascular ultrasound.  This confirmed the stenosis to be severe.  I then predilated the lesion  using the 2.5 x 12 mm Quantum at 10 atmospheres.  I then stented the lesion  using a 3.0  x 16 mm Taxus deployed at 16 atmospheres.  I postdilated this  stent using a 3.25 x 15 mm Quantum at 16 atmospheres.  I then gave  intracoronary nitroglycerin and proceeded to repeat intravascular  ultrasound.  That demonstrated the stent to be completely expanded and fully  apposed.  Catheter and wire were then removed.   The arteriotomy was closed using a Starclose device.  Complete hemostasis  was obtained.  The patient was then transferred to the holding room in  stable condition having tolerated the procedure well.   COMPLICATIONS:  None.   IMPRESSION/RECOMMENDATIONS:  Successful revascularization of both the left  anterior descending artery and circumflex using a total of four drug-eluting  stent:  The patient should be maintained on a combination of aspirin and  Plavix for life.      Salvadore Farber, M.D. [facility]  Electronically Signed     WED/MEDQ  D:  01/25/2006  T:   01/25/2006  Job:  [id number]

## 2011-02-07 ENCOUNTER — Encounter: Payer: Self-pay | Admitting: Cardiology

## 2011-02-14 ENCOUNTER — Encounter: Payer: Self-pay | Admitting: Cardiology

## 2011-02-14 ENCOUNTER — Ambulatory Visit (INDEPENDENT_AMBULATORY_CARE_PROVIDER_SITE_OTHER): Payer: Medicare Other | Admitting: Cardiology

## 2011-02-14 VITALS — BP 128/63 | HR 57 | Resp 14 | Ht 71.0 in | Wt 189.0 lb

## 2011-02-14 DIAGNOSIS — Z79899 Other long term (current) drug therapy: Secondary | ICD-10-CM

## 2011-02-14 DIAGNOSIS — E785 Hyperlipidemia, unspecified: Secondary | ICD-10-CM

## 2011-02-14 DIAGNOSIS — I447 Left bundle-branch block, unspecified: Secondary | ICD-10-CM

## 2011-02-14 DIAGNOSIS — I10 Essential (primary) hypertension: Secondary | ICD-10-CM

## 2011-02-14 DIAGNOSIS — I2510 Atherosclerotic heart disease of native coronary artery without angina pectoris: Secondary | ICD-10-CM

## 2011-02-14 DIAGNOSIS — Z131 Encounter for screening for diabetes mellitus: Secondary | ICD-10-CM

## 2011-02-14 LAB — HEPATIC FUNCTION PANEL
ALT: 15 U/L (ref 0–53)
Albumin: 4 g/dL (ref 3.5–5.2)
Alkaline Phosphatase: 43 U/L (ref 39–117)
Bilirubin, Direct: 0 mg/dL (ref 0.0–0.3)
Total Protein: 7.4 g/dL (ref 6.0–8.3)

## 2011-02-14 LAB — BASIC METABOLIC PANEL
CO2: 28 mEq/L (ref 19–32)
Calcium: 9 mg/dL (ref 8.4–10.5)
Chloride: 104 mEq/L (ref 96–112)
Glucose, Bld: 80 mg/dL (ref 70–99)
Potassium: 4.4 mEq/L (ref 3.5–5.1)
Sodium: 141 mEq/L (ref 135–145)

## 2011-02-14 LAB — LIPID PANEL
Cholesterol: 142 mg/dL (ref 0–200)
Total CHOL/HDL Ratio: 3

## 2011-02-14 MED ORDER — NITROGLYCERIN 0.4 MG SL SUBL: 0.4000 mg | SUBLINGUAL_TABLET | SUBLINGUAL | Status: DC | PRN

## 2011-02-14 NOTE — Assessment & Plan Note (Signed)
He will have a lipid profile today, comprehensive metabolic profile, and hemoglobin A1c since his blood sugars have been borderline the last couple of times. He is managed keep his weight down to 185-189. At the time of his event, his weight was 212. Continue current medication.

## 2011-02-14 NOTE — Assessment & Plan Note (Signed)
Stable. No change in medical therapy. 

## 2011-02-14 NOTE — Assessment & Plan Note (Signed)
Improved.  No change in treatment 

## 2011-02-14 NOTE — Assessment & Plan Note (Signed)
No change 

## 2011-02-14 NOTE — Patient Instructions (Signed)
Your physician recommends that you have lab work today, cholesterol, cmp and Hemoglobin A1C.  We will call you with your results.  Your physician recommends that you schedule a follow-up appointment in: 1 year with Dr. Daleen Squibb

## 2011-02-14 NOTE — Progress Notes (Signed)
Addended by: Mylo Red F on: 02/14/2011 01:33 PM   Modules accepted: Orders

## 2011-02-14 NOTE — Progress Notes (Signed)
HPI  [name] returns for evaluation and management of his coronary disease, left bundle branch block, hypertension, and hyperlipidemia.  He is having no angina or ischemic symptoms. He denies any palpitations or presyncope. He has no significant dyspnea on exertion.  He is up for blood work. Last couple of times his blood sugar has been borderline. His weight has been stable.  EKG today shows sinus bradycardia with a left bundle branch block.    Past Medical History  Diagnosis Date  . Coronary atherosclerosis of unspecified type of vessel, native or graft   . Dizziness and giddiness   . Chest pain, unspecified     Past Surgical History  Procedure Date  . Sinus surgery with instatrak 1985    Family History  Problem Relation Age of Onset  . Heart attack Father 72    History   Social History  . Marital Status: Divorced    Spouse Name: N/A    Number of Children: 3  . Years of Education: N/A   Occupational History  . Chairman     [employer]   Social History Main Topics  . Smoking status: Former Smoker    Quit date: 07/10/1981  . Smokeless tobacco: Not on file  . Alcohol Use: No  . Drug Use: Not on file  . Sexually Active: Not on file   Other Topics Concern  . Not on file   Social History Narrative   Regular exercise    No Known Allergies  Current Outpatient Prescriptions  Medication Sig Dispense Refill  . aspirin 325 MG EC tablet Take 325 mg by mouth daily.        . clopidogrel (PLAVIX) 75 MG tablet Take 75 mg by mouth daily.        Marland Kitchen lisinopril (PRINIVIL,ZESTRIL) 40 MG tablet Take 40 mg by mouth daily.        . metoprolol (TOPROL-XL) 50 MG 24 hr tablet Take 50 mg by mouth daily.        . nitroGLYCERIN (NITROSTAT) 0.4 MG SL tablet Place 0.4 mg under the tongue every 5 (five) minutes as needed.        . STUDY MEDICATION as directed.          ROS Negative other than HPI.   PE General Appearance: well developed, well nourished  in no acute distress HEENT: symmetrical face, PERRLA, good dentition  Neck: no JVD, thyromegaly, or adenopathy, trachea midline Chest: symmetric without deformity Cardiac: PMI non-displaced, RRR, normal S1, S2, no gallop or murmur Lung: clear to ausculation and percussion Vascular: all pulses full without bruits  Abdominal: nondistended, nontender, good bowel sounds, no HSM, no bruits Extremities: no cyanosis, clubbing or edema, no sign of DVT, no varicosities  Skin: normal color, no rashes Neuro: alert and oriented x 3, non-focal Pysch: normal affect Filed Vitals:   02/14/11 1233  BP: 128/63  Pulse: 57  Resp: 14  Height: 5\' 11"  (1.803 m)  Weight: 189 lb (85.73 kg)    EKG  Labs and Studies Reviewed.   Lab Results  Component Value Date   WBC 7.8 11/09/2009   HGB 15.4 11/09/2009   HCT 45.3 11/09/2009   MCV 86.1 11/09/2009   PLT 223.0 11/09/2009      Chemistry      Component Value Date/Time   NA 141 11/24/2009 0948   K 4.3 11/24/2009 0948   CL 105 11/24/2009 0948   CO2 29 11/24/2009 0948   BUN 30* 11/24/2009 1610  CREATININE 1.5 11/24/2009 0948      Component Value Date/Time   CALCIUM 9.1 11/24/2009 0948   ALKPHOS 50 11/09/2009 1133   AST 20 11/09/2009 1133   ALT 16 11/09/2009 1133   BILITOT 1.1 11/09/2009 1133       Lab Results  Component Value Date   CHOL 139 11/09/2009   CHOL 145 12/14/2008   Lab Results  Component Value Date   HDL 42.40 11/09/2009   HDL 39.00* 12/14/2008   Lab Results  Component Value Date   LDLCALC 78 11/09/2009   LDLCALC 87 12/14/2008   Lab Results  Component Value Date   TRIG 95.0 11/09/2009   TRIG 97.0 12/14/2008   Lab Results  Component Value Date   CHOLHDL 3 11/09/2009   CHOLHDL 4 12/14/2008   Lab Results  Component Value Date   HGBA1C 5.9 11/09/2009   Lab Results  Component Value Date   ALT 16 11/09/2009   AST 20 11/09/2009   ALKPHOS 50 11/09/2009   BILITOT 1.1 11/09/2009   No results found for this basename: TSH

## 2011-02-16 ENCOUNTER — Telehealth: Payer: Self-pay | Admitting: *Deleted

## 2011-02-16 NOTE — Telephone Encounter (Signed)
Pt aware of lab results. Copy mailed to him Mylo Red RN

## 2011-02-16 NOTE — Telephone Encounter (Signed)
Message copied by Theda Belfast on Thu Feb 16, 2011  8:33 AM ------      Message from: Valera Castle C      Created: Thu Feb 16, 2011  8:29 AM       Excellent, no change.

## 2011-03-20 ENCOUNTER — Other Ambulatory Visit: Payer: Self-pay | Admitting: Cardiology

## 2011-04-04 ENCOUNTER — Other Ambulatory Visit: Payer: Self-pay | Admitting: Cardiology

## 2011-05-23 ENCOUNTER — Other Ambulatory Visit: Payer: Self-pay

## 2011-05-23 MED ORDER — CLOPIDOGREL BISULFATE 75 MG PO TABS: 75.0000 mg | ORAL_TABLET | Freq: Every day | ORAL | Status: DC

## 2011-07-18 ENCOUNTER — Telehealth: Payer: Self-pay | Admitting: Cardiology

## 2011-07-18 NOTE — Telephone Encounter (Signed)
New msg He has been sick and had bronchitis. He is still coughing and it has been about two weeks. Please call.

## 2011-07-18 NOTE — Telephone Encounter (Signed)
Mrs Hartis is calling to report that Mr Udell had bronchitis (not diagnosed) around Christmas and is still coughing.  Cough for 2+ weeks.  I recommended that Mr Bracamonte go to Urgent Care.

## 2012-02-16 ENCOUNTER — Encounter: Payer: Self-pay | Admitting: Cardiology

## 2012-02-16 ENCOUNTER — Ambulatory Visit (INDEPENDENT_AMBULATORY_CARE_PROVIDER_SITE_OTHER): Payer: Medicare Other | Admitting: Cardiology

## 2012-02-16 VITALS — BP 158/66 | HR 54 | Ht 71.0 in | Wt 185.0 lb

## 2012-02-16 DIAGNOSIS — I447 Left bundle-branch block, unspecified: Secondary | ICD-10-CM

## 2012-02-16 DIAGNOSIS — R42 Dizziness and giddiness: Secondary | ICD-10-CM

## 2012-02-16 DIAGNOSIS — I2510 Atherosclerotic heart disease of native coronary artery without angina pectoris: Secondary | ICD-10-CM

## 2012-02-16 DIAGNOSIS — E785 Hyperlipidemia, unspecified: Secondary | ICD-10-CM

## 2012-02-16 DIAGNOSIS — I10 Essential (primary) hypertension: Secondary | ICD-10-CM

## 2012-02-16 DIAGNOSIS — R350 Frequency of micturition: Secondary | ICD-10-CM

## 2012-02-16 LAB — CBC WITH DIFFERENTIAL/PLATELET
Basophils Absolute: 0 10*3/uL (ref 0.0–0.1)
Basophils Relative: 0.4 % (ref 0.0–3.0)
Eosinophils Relative: 2.8 % (ref 0.0–5.0)
HCT: 45.5 % (ref 39.0–52.0)
MCHC: 33.3 g/dL (ref 30.0–36.0)
MCV: 87.4 fl (ref 78.0–100.0)
Monocytes Relative: 8.6 % (ref 3.0–12.0)
Neutrophils Relative %: 65.7 % (ref 43.0–77.0)
Platelets: 213 10*3/uL (ref 150.0–400.0)

## 2012-02-16 LAB — HEPATIC FUNCTION PANEL
Alkaline Phosphatase: 49 U/L (ref 39–117)
Bilirubin, Direct: 0.1 mg/dL (ref 0.0–0.3)
Total Protein: 7.4 g/dL (ref 6.0–8.3)

## 2012-02-16 LAB — BASIC METABOLIC PANEL
Creatinine, Ser: 1.3 mg/dL (ref 0.4–1.5)
Glucose, Bld: 100 mg/dL — ABNORMAL HIGH (ref 70–99)
Potassium: 4.2 mEq/L (ref 3.5–5.1)

## 2012-02-16 LAB — LIPID PANEL
Cholesterol: 152 mg/dL (ref 0–200)
HDL: 44.2 mg/dL (ref >39.00)
Total CHOL/HDL Ratio: 3
Triglycerides: 99 mg/dL (ref 0.0–149.0)

## 2012-02-16 LAB — PSA: PSA: 4.39 ng/mL — ABNORMAL HIGH (ref 0.10–4.00)

## 2012-02-16 NOTE — Assessment & Plan Note (Signed)
Doing well. No change in medications. We'll check labs. He is doing well I'll see him back in a year.

## 2012-02-16 NOTE — Progress Notes (Signed)
HPI [name] comes in today for evaluation and management his coronary artery disease.  His daughter and he called me earlier this summer on a Saturday. He was having what sounded like a potential stroke. He was characterized by dizziness and instability in his legs. He was not true vertigo. I advised that time that he be evaluated. He wished to wait it out. It has not recurred.  Denies any angina or ischemic symptoms. He remains very active. He is due to blood work.  Past Medical History  Diagnosis Date  . Coronary atherosclerosis of unspecified type of vessel, native or graft   . Dizziness and giddiness   . Chest pain, unspecified     Current Outpatient Prescriptions  Medication Sig Dispense Refill  . aspirin 325 MG EC tablet Take 325 mg by mouth daily.        . clopidogrel (PLAVIX) 75 MG tablet Take 1 tablet (75 mg total) by mouth daily.  30 tablet  9  . lisinopril (PRINIVIL,ZESTRIL) 40 MG tablet TAKE ONE TABLET EVERY DAY  30 tablet  11  . metoprolol (TOPROL-XL) 50 MG 24 hr tablet TAKE ONE TABLET EACH DAY  30 tablet  12  . nitroGLYCERIN (NITROSTAT) 0.4 MG SL tablet Place 1 tablet (0.4 mg total) under the tongue every 5 (five) minutes as needed.  90 tablet  6  . STUDY MEDICATION as directed.          No Known Allergies  Family History  Problem Relation Age of Onset  . Heart attack Father 17    History   Social History  . Marital Status: Divorced    Spouse Name: N/A    Number of Children: 3  . Years of Education: N/A   Occupational History  . Chairman     [employer]   Social History Main Topics  . Smoking status: Former Smoker    Quit date: 07/10/1981  . Smokeless tobacco: Not on file  . Alcohol Use: No  . Drug Use: Not on file  . Sexually Active: Not on file   Other Topics Concern  . Not on file   Social History Narrative   Regular exercise    ROS ALL NEGATIVE EXCEPT THOSE NOTED IN HPI  PE  General Appearance: well developed, well  nourished in no acute distress HEENT: symmetrical face, PERRLA, good dentition  Neck: no JVD, thyromegaly, or adenopathy, trachea midline Chest: symmetric without deformity Cardiac: PMI non-displaced, RRR, normal S1, paradoxical S2, no gallop or murmur Lung: clear to ausculation and percussion Vascular: all pulses full without bruits  Abdominal: nondistended, nontender, good bowel sounds, no HSM, no bruits Extremities: no cyanosis, clubbing or edema, no sign of DVT, no varicosities  Skin: normal color, no rashes Neuro: alert and oriented x 3, non-focal Pysch: normal affect  EKG Normal sinus rhythm, left bundle branch block BMET    Component Value Date/Time   NA 141 02/14/2011 1336   K 4.4 02/14/2011 1336   CL 104 02/14/2011 1336   CO2 28 02/14/2011 1336   GLUCOSE 80 02/14/2011 1336   BUN 31* 02/14/2011 1336   CREATININE 1.5 02/14/2011 1336   CALCIUM 9.0 02/14/2011 1336   GFRNONAA 48.86 11/24/2009 0948    Lipid Panel     Component Value Date/Time   CHOL 142 02/14/2011 1336   TRIG 139.0 02/14/2011 1336   HDL 42.80 02/14/2011 1336   CHOLHDL 3 02/14/2011 1336   VLDL 27.8 02/14/2011 1336   LDLCALC 71 02/14/2011 1336  CBC    Component Value Date/Time   WBC 7.8 11/09/2009 1133   RBC 5.27 11/09/2009 1133   HGB 15.4 11/09/2009 1133   HCT 45.3 11/09/2009 1133   PLT 223.0 11/09/2009 1133   MCV 86.1 11/09/2009 1133   MCHC 34.0 11/09/2009 1133   RDW 13.1 11/09/2009 1133   LYMPHSABS 1.7 11/09/2009 1133   MONOABS 0.6 11/09/2009 1133   EOSABS 0.2 11/09/2009 1133   BASOSABS 0.0 11/09/2009 1133

## 2012-02-16 NOTE — Patient Instructions (Addendum)
Your physician recommends that you have lab work today:  Lipid, cmp, psa, cbc  Your physician recommends that you continue on your current medications as directed. Please refer to the Current Medication list given to you today.  Your physician wants you to follow-up in: 1 year with Dr. Daleen Squibb. You will receive a reminder letter in the mail two months in advance. If you don't receive a letter, please call our office to schedule the follow-up appointment.

## 2012-02-21 ENCOUNTER — Telehealth: Payer: Self-pay | Admitting: *Deleted

## 2012-02-21 DIAGNOSIS — R972 Elevated prostate specific antigen [PSA]: Secondary | ICD-10-CM

## 2012-02-21 NOTE — Telephone Encounter (Signed)
Message copied by Barrie Folk on Wed Feb 21, 2012  8:30 AM ------      Message from: Valera Castle C      Created: Tue Feb 20, 2012  8:02 AM       Numbers look good except for PSA. Please refer him to urology first available.

## 2012-02-21 NOTE — Telephone Encounter (Signed)
I spoke with pt and he is aware of lab results. He understands urology consult for elevated PSA. Mylo Red RN

## 2012-04-01 ENCOUNTER — Encounter (INDEPENDENT_AMBULATORY_CARE_PROVIDER_SITE_OTHER): Payer: Self-pay

## 2012-04-19 ENCOUNTER — Encounter (INDEPENDENT_AMBULATORY_CARE_PROVIDER_SITE_OTHER): Payer: Self-pay | Admitting: General Surgery

## 2012-04-19 ENCOUNTER — Ambulatory Visit (INDEPENDENT_AMBULATORY_CARE_PROVIDER_SITE_OTHER): Payer: Medicare Other | Admitting: General Surgery

## 2012-04-19 VITALS — BP 148/70 | HR 76 | Temp 98.1°F | Resp 16 | Ht 71.0 in | Wt 185.4 lb

## 2012-04-19 DIAGNOSIS — K4090 Unilateral inguinal hernia, without obstruction or gangrene, not specified as recurrent: Secondary | ICD-10-CM

## 2012-04-19 NOTE — Progress Notes (Signed)
Patient ID: [name], male   DOB: 1930-01-26, 78 y.o.   MRN: [id number]  Chief Complaint  Patient presents with  . Pre-op Exam    eval RIH    HPI [name] is a 78 y.o. male.   HPI  He is referred by Dr. Vernie Ammons for further evaluation and treatment of a right inguinal hernia.  This has been present for a while and at times it uncomfortable-when he coughs or strains.  He is interested in having it repaired if he needs to.  He does have some BPH symptoms.  Past Medical History  Diagnosis Date  . Coronary atherosclerosis of unspecified type of vessel, native or graft s/p stent placement; he is also in a study    . Dizziness and giddiness   . Chest pain, unspecified     Past Surgical History  Procedure Date  . Sinus surgery with instatrak 1985    Family History  Problem Relation Age of Onset  . Heart attack Father 74    Social History History  Substance Use Topics  . Smoking status: Former Smoker    Quit date: 07/10/1981  . Smokeless tobacco: Not on file  . Alcohol Use: No    No Known Allergies  Current Outpatient Prescriptions  Medication Sig Dispense Refill  . aspirin 325 MG EC tablet Take 325 mg by mouth daily.        . clopidogrel (PLAVIX) 75 MG tablet Take 1 tablet (75 mg total) by mouth daily.  30 tablet  9  . lisinopril (PRINIVIL,ZESTRIL) 40 MG tablet TAKE ONE TABLET EVERY DAY  30 tablet  11  . metoprolol (TOPROL-XL) 50 MG 24 hr tablet TAKE ONE TABLET EACH DAY  30 tablet  12  . nitroGLYCERIN (NITROSTAT) 0.4 MG SL tablet Place 1 tablet (0.4 mg total) under the tongue every 5 (five) minutes as needed.  90 tablet  6  . STUDY MEDICATION as directed.          Review of Systems Review of Systems  Respiratory: Negative for shortness of breath.   Cardiovascular: Negative for chest pain.  Gastrointestinal: Negative for diarrhea and constipation.  Genitourinary: Positive for urgency, frequency and difficulty urinating.    Blood pressure 148/70, pulse 76,  temperature 98.1 F (36.7 C), temperature source Temporal, resp. rate 16, height 5\' 11"  (1.803 m), weight 185 lb 6.4 oz (84.097 kg).  Physical Exam Physical Exam  Constitutional: He appears well-developed and well-nourished. No distress.  HENT:  Head: Normocephalic and atraumatic.  Cardiovascular: Normal rate and regular rhythm.   Pulmonary/Chest: Effort normal and breath sounds normal.  Abdominal: Soft. He exhibits no distension and no mass.  Genitourinary:       Right inguinal bulge that is reducible.  No left inguinal bulge.  Musculoskeletal: He exhibits no edema.    Data Reviewed Dr. Margrett Rud note.  Dr. Vern Claude notes.  Assessment    Symptomatic RIH     Plan    We discussed open RIH repair with mesh.  However, I will need to communicate with Dr. Daleen Squibb regarding this before scheduling his surgery.  I have explained the procedure, risks, and aftercare of inguinal hernia repair.  Risks include but are not limited to bleeding, infection, wound problems, anesthesia, recurrence, bladder or intestine injury, urinary retention, testicular dysfunction, chronic pain, mesh problems.  He seems to understand and agrees with the plan.       Makayli Bracken J 04/19/2012, 5:27 PM

## 2012-04-19 NOTE — Patient Instructions (Addendum)
We will call you to schedule your surgery.  [facility] _______[facility]  UMBILICAL OR INGUINAL HERNIA REPAIR: POST OP INSTRUCTIONS  Always review your discharge instruction sheet given to you by the facility where your surgery was performed. IF YOU HAVE DISABILITY OR FAMILY LEAVE FORMS, YOU MUST BRING THEM TO THE OFFICE FOR PROCESSING.   DO NOT GIVE THEM TO YOUR DOCTOR.  1. A  prescription for pain medication may be given to you upon discharge.  Take your pain medication as prescribed, if needed.  If narcotic pain medicine is not needed, then you may take acetaminophen (Tylenol) or ibuprofen (Advil) as needed. 2. Take your usually prescribed medications unless otherwise directed. 3. If you need a refill on your pain medication, please contact your pharmacy.  They will contact our office to request authorization. Prescriptions will not be filled after 5 pm or on week-ends. 4. You should follow a light diet the first 24 hours after arrival home, such as soup and crackers, etc.  Be sure to include lots of fluids daily.  Resume your normal diet the day after surgery. 5. Most patients will experience some swelling and bruising around the umbilicus or in the groin and scrotum.  Ice packs and reclining will help.  Swelling and bruising can take several days to resolve.  6. It is common to experience some constipation if taking pain medication after surgery.  Increasing fluid intake and taking a stool softener (such as Colace) will usually help or prevent this problem from occurring.  A mild laxative (Milk of Magnesia or Miralax) should be taken according to package directions if there are no bowel movements after 48 hours. 7. Unless discharge instructions indicate otherwise, you may remove your bandages 24-48 hours after surgery, and you may shower at that time.  You may have steri-strips (small skin tapes) in place directly over the incision.  These strips should be left on the skin for 7-10  days.  If your surgeon used skin glue on the incision, you may shower in 24 hours.  The glue will flake off over the next 2-3 weeks.  Any sutures or staples will be removed at the office during your follow-up visit. 8. ACTIVITIES:  You may resume regular (light) daily activities beginning the next day-such as daily self-care, walking, climbing stairs-gradually increasing activities as tolerated.  You may have sexual intercourse when it is comfortable.  Refrain from any heavy lifting or straining until approved by your doctor. a. You may drive when you are no longer taking prescription pain medication, you can comfortably wear a seatbelt, and you can safely maneuver your car and apply brakes. b. RETURN TO WORK:  __________________________________________________________ 9. You should see your doctor in the office for a follow-up appointment approximately 2-3 weeks after your surgery.  Make sure that you call for this appointment within a day or two after you arrive home to insure a convenient appointment time. 10. OTHER INSTRUCTIONS:  __________________________________________________________________________________________________________________________________________________________________________________________  WHEN TO CALL YOUR DOCTOR: 1. Fever over 101.5 2. Inability to urinate 3. Nausea and/or vomiting 4. Extreme swelling or bruising 5. Continued bleeding from incision. 6. Increased pain, redness, or drainage from the incision  The clinic staff is available to answer your questions during regular business hours.  Please don't hesitate to call and ask to speak to one of the nurses for clinical concerns.  If you have a medical emergency, go to the nearest emergency room or call 911.  A surgeon from [facility]  is always on call at the hospital   [address], [address], [place], [place]  [address] ?  [address] [address], [place], [place]   [address] [phone] ? [phone]  ? FAX [phone] Web site: [email or web address]

## 2012-04-25 ENCOUNTER — Telehealth (INDEPENDENT_AMBULATORY_CARE_PROVIDER_SITE_OTHER): Payer: Self-pay | Admitting: General Surgery

## 2012-04-25 NOTE — Telephone Encounter (Signed)
[name] from Dr. Vern Claude office called in stating the patient has been cleared for surgery. Advised that the clearance was sent directly to Dr. Abbey Chatters and she was calling to advise. Patient to have [facility] repair.

## 2012-04-26 ENCOUNTER — Telehealth (INDEPENDENT_AMBULATORY_CARE_PROVIDER_SITE_OTHER): Payer: Self-pay

## 2012-04-29 NOTE — Telephone Encounter (Signed)
Close encounter 

## 2012-05-01 ENCOUNTER — Encounter (INDEPENDENT_AMBULATORY_CARE_PROVIDER_SITE_OTHER): Payer: Self-pay | Admitting: General Surgery

## 2012-05-01 NOTE — Progress Notes (Unsigned)
Patient ID: [name], male   DOB: 06/22/1930, 78 y.o.   MRN: [id number] Dr. Daleen Squibb has cleared him from a cardiac standpoint.  He may also have his Aspirin and Plavix stopped.

## 2012-05-22 ENCOUNTER — Other Ambulatory Visit: Payer: Self-pay | Admitting: *Deleted

## 2012-05-22 MED ORDER — LISINOPRIL 40 MG PO TABS: 40.0000 mg | ORAL_TABLET | Freq: Every day | ORAL | Status: DC

## 2012-06-03 ENCOUNTER — Encounter (HOSPITAL_COMMUNITY): Payer: Self-pay | Admitting: Pharmacy Technician

## 2012-06-04 ENCOUNTER — Ambulatory Visit (HOSPITAL_COMMUNITY)
Admission: RE | Admit: 2012-06-04 | Discharge: 2012-06-04 | Disposition: A | Discharge status: Home / Self Care | Payer: Medicare Other | Source: Ambulatory Visit | Attending: Anesthesiology | Admitting: Anesthesiology

## 2012-06-04 ENCOUNTER — Encounter (HOSPITAL_COMMUNITY): Payer: Self-pay

## 2012-06-04 ENCOUNTER — Encounter (HOSPITAL_COMMUNITY)
Admission: RE | Admit: 2012-06-04 | Discharge: 2012-06-04 | Disposition: A | Discharge status: Home / Self Care | Payer: Medicare Other | Source: Ambulatory Visit | Attending: General Surgery | Admitting: General Surgery

## 2012-06-04 DIAGNOSIS — Z01818 Encounter for other preprocedural examination: Secondary | ICD-10-CM | POA: Insufficient documentation

## 2012-06-04 DIAGNOSIS — I10 Essential (primary) hypertension: Secondary | ICD-10-CM | POA: Insufficient documentation

## 2012-06-04 HISTORY — DX: Essential (primary) hypertension: I10

## 2012-06-04 HISTORY — DX: Unspecified osteoarthritis, unspecified site: M19.90

## 2012-06-04 HISTORY — DX: Acute myocardial infarction, unspecified: I21.9

## 2012-06-04 HISTORY — DX: Shortness of breath: R06.02

## 2012-06-04 LAB — CBC WITH DIFFERENTIAL/PLATELET
Basophils Absolute: 0 10*3/uL (ref 0.0–0.1)
Eosinophils Relative: 5 % (ref 0–5)
Lymphocytes Relative: 28 % (ref 12–46)
Neutro Abs: 4.8 10*3/uL (ref 1.7–7.7)
Platelets: 251 10*3/uL (ref 150–400)
RDW: 12.5 % (ref 11.5–15.5)
WBC: 8.4 10*3/uL (ref 4.0–10.5)

## 2012-06-04 LAB — COMPREHENSIVE METABOLIC PANEL
ALT: 15 U/L (ref 0–53)
AST: 18 U/L (ref 0–37)
CO2: 28 mEq/L (ref 19–32)
Calcium: 9.4 mg/dL (ref 8.4–10.5)
Chloride: 100 mEq/L (ref 96–112)
GFR calc non Af Amer: 43 mL/min — ABNORMAL LOW (ref >90)
Sodium: 137 mEq/L (ref 135–145)

## 2012-06-04 LAB — SURGICAL PCR SCREEN
MRSA, PCR: NEGATIVE
Staphylococcus aureus: NEGATIVE

## 2012-06-04 NOTE — Progress Notes (Signed)
Does not have a primary physician Cardiologist - Dr. Daleen Squibb EKG - August 2013- in epic Stress test within last year at dr. Vern Claude office

## 2012-06-04 NOTE — Pre-Procedure Instructions (Signed)
[address]  06/04/2012   Your procedure is scheduled on:  Friday June 14, 2012  Report to [facility] at 5:30 AM.  Call this number if you have problems the morning of surgery: [phone]   Remember:   Do not eat food:After Midnight.    Take these medicines the morning of surgery with A SIP OF WATER: metoprolol, nitroglycerin (if needed), DISCONTINUE ASPIRIN, COUMADIN, PLAVIX, EFFIENT, AND HERBAL MEDICATIONS.    Do not wear jewelry, make-up or nail polish.  Do not wear lotions, powders, or perfumes.  Do not shave 48 hours prior to surgery. Men may shave face and neck.  Do not bring valuables to the hospital.  Contacts, dentures or bridgework may not be worn into surgery.  Leave suitcase in the car. After surgery it may be brought to your room.  For patients admitted to the hospital, checkout time is 11:00 AM the day of discharge.   Patients discharged the day of surgery will not be allowed to drive home.  Name and phone number of your driver: FAMILY / FRIEND  Special Instructions: Shower using CHG 2 nights before surgery and the night before surgery.  If you shower the day of surgery use CHG.  Use special wash - you have one bottle of CHG for all showers.  You should use approximately 1/3 of the bottle for each shower.   Please read over the following fact sheets that you were given: Pain Booklet, Coughing and Deep Breathing, MRSA Information and Surgical Site Infection Prevention

## 2012-06-13 MED ORDER — CEFAZOLIN SODIUM-DEXTROSE 2-3 GM-% IV SOLR
2.0000 g | INTRAVENOUS | Status: AC
  Administered 2012-06-14: 2 g via INTRAVENOUS
  Filled 2012-06-13: qty 50

## 2012-06-14 ENCOUNTER — Ambulatory Visit (HOSPITAL_COMMUNITY): Payer: Medicare Other | Admitting: Anesthesiology

## 2012-06-14 ENCOUNTER — Encounter (HOSPITAL_COMMUNITY): Payer: Self-pay | Admitting: Anesthesiology

## 2012-06-14 ENCOUNTER — Encounter (HOSPITAL_COMMUNITY)
Admission: RE | Disposition: A | Discharge status: Home / Self Care | Payer: Self-pay | Source: Ambulatory Visit | Attending: General Surgery

## 2012-06-14 ENCOUNTER — Ambulatory Visit (HOSPITAL_COMMUNITY)
Admission: RE | Admit: 2012-06-14 | Discharge: 2012-06-14 | Disposition: A | Discharge status: Home / Self Care | Payer: Medicare Other | Source: Ambulatory Visit | Attending: General Surgery | Admitting: General Surgery

## 2012-06-14 DIAGNOSIS — I10 Essential (primary) hypertension: Secondary | ICD-10-CM | POA: Insufficient documentation

## 2012-06-14 DIAGNOSIS — K4090 Unilateral inguinal hernia, without obstruction or gangrene, not specified as recurrent: Secondary | ICD-10-CM | POA: Insufficient documentation

## 2012-06-14 HISTORY — PX: INSERTION OF MESH: SHX5868

## 2012-06-14 HISTORY — PX: INGUINAL HERNIA REPAIR: SHX194

## 2012-06-14 SURGERY — REPAIR, HERNIA, INGUINAL, ADULT
Anesthesia: General | Site: Abdomen | Laterality: Right | Wound class: Clean

## 2012-06-14 MED ORDER — BUPIVACAINE-EPINEPHRINE 0.5% -1:200000 IJ SOLN
INTRAMUSCULAR | Status: DC | PRN
  Administered 2012-06-14: 30 mL

## 2012-06-14 MED ORDER — FENTANYL CITRATE 0.05 MG/ML IJ SOLN
INTRAMUSCULAR | Status: DC | PRN
  Administered 2012-06-14: 125 ug via INTRAVENOUS
  Administered 2012-06-14: 25 ug via INTRAVENOUS

## 2012-06-14 MED ORDER — OXYCODONE HCL 5 MG PO TABS: 5.0000 mg | ORAL_TABLET | Freq: Once | ORAL | Status: DC | PRN

## 2012-06-14 MED ORDER — PROMETHAZINE HCL 25 MG/ML IJ SOLN: 6.2500 mg | INTRAMUSCULAR | Status: DC | PRN

## 2012-06-14 MED ORDER — PROPOFOL 10 MG/ML IV BOLUS
INTRAVENOUS | Status: DC | PRN
  Administered 2012-06-14: 120 mg via INTRAVENOUS

## 2012-06-14 MED ORDER — BUPIVACAINE-EPINEPHRINE (PF) 0.5% -1:200000 IJ SOLN
INTRAMUSCULAR | Status: AC
  Filled 2012-06-14: qty 10

## 2012-06-14 MED ORDER — OXYCODONE HCL 5 MG PO TABS
ORAL_TABLET | ORAL | Status: AC
  Filled 2012-06-14: qty 2

## 2012-06-14 MED ORDER — HYDROMORPHONE HCL PF 1 MG/ML IJ SOLN: 0.2500 mg | INTRAMUSCULAR | Status: DC | PRN

## 2012-06-14 MED ORDER — ACETAMINOPHEN 325 MG PO TABS: 650.0000 mg | ORAL_TABLET | ORAL | Status: DC | PRN

## 2012-06-14 MED ORDER — LACTATED RINGERS IV SOLN
INTRAVENOUS | Status: DC | PRN
  Administered 2012-06-14: 07:00:00 via INTRAVENOUS

## 2012-06-14 MED ORDER — ONDANSETRON HCL 4 MG/2ML IJ SOLN: 4.0000 mg | Freq: Four times a day (QID) | INTRAMUSCULAR | Status: DC | PRN

## 2012-06-14 MED ORDER — OXYCODONE HCL 5 MG PO TABS
5.0000 mg | ORAL_TABLET | ORAL | Status: DC | PRN
  Administered 2012-06-14: 10 mg via ORAL

## 2012-06-14 MED ORDER — OXYCODONE HCL 5 MG/5ML PO SOLN: 5.0000 mg | Freq: Once | ORAL | Status: DC | PRN

## 2012-06-14 MED ORDER — MIDAZOLAM HCL 5 MG/5ML IJ SOLN
INTRAMUSCULAR | Status: DC | PRN
  Administered 2012-06-14 (×2): .5 mg via INTRAVENOUS

## 2012-06-14 MED ORDER — LIDOCAINE HCL (CARDIAC) 20 MG/ML IV SOLN
INTRAVENOUS | Status: DC | PRN
  Administered 2012-06-14: 100 mg via INTRAVENOUS

## 2012-06-14 MED ORDER — ONDANSETRON HCL 4 MG/2ML IJ SOLN
INTRAMUSCULAR | Status: DC | PRN
  Administered 2012-06-14: 4 mg via INTRAVENOUS

## 2012-06-14 MED ORDER — SODIUM CHLORIDE 0.9 % IJ SOLN: 3.0000 mL | INTRAMUSCULAR | Status: DC | PRN

## 2012-06-14 MED ORDER — METOPROLOL TARTRATE 50 MG PO TABS
ORAL_TABLET | ORAL | Status: AC
  Administered 2012-06-14: 50 mg via ORAL
  Filled 2012-06-14: qty 1

## 2012-06-14 MED ORDER — ACETAMINOPHEN 650 MG RE SUPP: 650.0000 mg | RECTAL | Status: DC | PRN

## 2012-06-14 MED ORDER — 0.9 % SODIUM CHLORIDE (POUR BTL) OPTIME
TOPICAL | Status: DC | PRN
  Administered 2012-06-14: 1000 mL

## 2012-06-14 MED ORDER — PHENYLEPHRINE HCL 10 MG/ML IJ SOLN
INTRAMUSCULAR | Status: DC | PRN
  Administered 2012-06-14: 120 ug via INTRAVENOUS

## 2012-06-14 MED ORDER — OXYCODONE-ACETAMINOPHEN 5-325 MG PO TABS: 1.0000 | ORAL_TABLET | ORAL | Status: DC | PRN

## 2012-06-14 MED ORDER — EPHEDRINE SULFATE 50 MG/ML IJ SOLN
INTRAMUSCULAR | Status: DC | PRN
  Administered 2012-06-14: 15 mg via INTRAVENOUS
  Administered 2012-06-14: 2.5 mg via INTRAVENOUS

## 2012-06-14 SURGICAL SUPPLY — 50 items
APL SKNCLS STERI-STRIP NONHPOA (GAUZE/BANDAGES/DRESSINGS) ×1
BENZOIN TINCTURE PRP APPL 2/3 (GAUZE/BANDAGES/DRESSINGS) ×2 IMPLANT
BLADE SURG 10 STRL SS (BLADE) IMPLANT
BLADE SURG 15 STRL LF DISP TIS (BLADE) IMPLANT
BLADE SURG 15 STRL SS (BLADE)
BLADE SURG ROTATE 9660 (MISCELLANEOUS) ×2 IMPLANT
CHLORAPREP W/TINT 26ML (MISCELLANEOUS) ×2 IMPLANT
CLOTH BEACON ORANGE TIMEOUT ST (SAFETY) ×2 IMPLANT
COVER SURGICAL LIGHT HANDLE (MISCELLANEOUS) ×2 IMPLANT
DRAIN PENROSE 1/2X12 LTX STRL (WOUND CARE) ×2 IMPLANT
DRAPE INCISE IOBAN 66X45 STRL (DRAPES) ×2 IMPLANT
DRAPE LAPAROTOMY TRNSV 102X78 (DRAPE) ×2 IMPLANT
DRESSING TELFA 8X3 (GAUZE/BANDAGES/DRESSINGS) ×2 IMPLANT
DRSG OPSITE 6X11 MED (GAUZE/BANDAGES/DRESSINGS) ×2 IMPLANT
ELECT CAUTERY BLADE 6.4 (BLADE) ×2 IMPLANT
ELECT REM PT RETURN 9FT ADLT (ELECTROSURGICAL) ×2
ELECTRODE REM PT RTRN 9FT ADLT (ELECTROSURGICAL) ×1 IMPLANT
GLOVE BIO SURGEON STRL SZ7 (GLOVE) ×2 IMPLANT
GLOVE BIOGEL PI IND STRL 7.0 (GLOVE) ×1 IMPLANT
GLOVE BIOGEL PI IND STRL 7.5 (GLOVE) ×1 IMPLANT
GLOVE BIOGEL PI IND STRL 8 (GLOVE) ×1 IMPLANT
GLOVE BIOGEL PI INDICATOR 7.0 (GLOVE) ×1
GLOVE BIOGEL PI INDICATOR 7.5 (GLOVE) ×1
GLOVE BIOGEL PI INDICATOR 8 (GLOVE) ×1
GLOVE ECLIPSE 8.0 STRL XLNG CF (GLOVE) ×2 IMPLANT
GLOVE SURG EUDERMIC 8 LTX PF (GLOVE) ×2 IMPLANT
GOWN STRL NON-REIN LRG LVL3 (GOWN DISPOSABLE) ×4 IMPLANT
KIT BASIN OR (CUSTOM PROCEDURE TRAY) ×2 IMPLANT
KIT ROOM TURNOVER OR (KITS) ×2 IMPLANT
MESH HERNIA 3X6 (Mesh General) ×2 IMPLANT
NEEDLE HYPO 25GX1X1/2 BEV (NEEDLE) ×2 IMPLANT
NS IRRIG 1000ML POUR BTL (IV SOLUTION) ×2 IMPLANT
PACK GENERAL/GYN (CUSTOM PROCEDURE TRAY) ×2 IMPLANT
PACK SURGICAL SETUP 50X90 (CUSTOM PROCEDURE TRAY) IMPLANT
PAD ARMBOARD 7.5X6 YLW CONV (MISCELLANEOUS) ×4 IMPLANT
PENCIL BUTTON HOLSTER BLD 10FT (ELECTRODE) IMPLANT
SPECIMEN JAR SMALL (MISCELLANEOUS) IMPLANT
SPONGE LAP 18X18 X RAY DECT (DISPOSABLE) IMPLANT
STRIP CLOSURE SKIN 1/2X4 (GAUZE/BANDAGES/DRESSINGS) ×2 IMPLANT
SUT MON AB 4-0 PC3 18 (SUTURE) ×2 IMPLANT
SUT PROLENE 2 0 CT2 30 (SUTURE) ×4 IMPLANT
SUT SILK 2 0 SH (SUTURE) IMPLANT
SUT VIC AB 2-0 SH 18 (SUTURE) ×2 IMPLANT
SUT VIC AB 3-0 SH 27 (SUTURE) ×6
SUT VIC AB 3-0 SH 27X BRD (SUTURE) ×2 IMPLANT
SUT VIC AB 3-0 SH 27XBRD (SUTURE) ×1 IMPLANT
SUT VICRYL AB 3 0 TIES (SUTURE) ×2 IMPLANT
SYR CONTROL 10ML LL (SYRINGE) ×2 IMPLANT
TOWEL OR 17X24 6PK STRL BLUE (TOWEL DISPOSABLE) ×2 IMPLANT
TOWEL OR 17X26 10 PK STRL BLUE (TOWEL DISPOSABLE) ×2 IMPLANT

## 2012-06-14 NOTE — Brief Op Note (Signed)
06/14/2012  8:58 AM  PATIENT:  [name]  78 y.o. male  PRE-OPERATIVE DIAGNOSIS:  right inguinal hernia  POST-OPERATIVE DIAGNOSIS:  Same  PROCEDURE:  Procedure(s) (LRB) with comments: HERNIA REPAIR INGUINAL ADULT (Right) INSERTION OF MESH (Right)  SURGEON:  Surgeon(s) and Role:    * Adolph Pollack, MD - Primary  PHYSICIAN ASSISTANT:   ASSISTANTS: none   ANESTHESIA:   general  EBL:  Total I/O In: -  Out: 25 [Blood:25]  BLOOD ADMINISTERED:none  DRAINS: none   LOCAL MEDICATIONS USED:  MARCAINE     SPECIMEN:  No Specimen  DISPOSITION OF SPECIMEN:  N/A  COUNTS:  YES  TOURNIQUET:  * No tourniquets in log *  DICTATION: .Dragon Dictation  PLAN OF CARE: Discharge to home after PACU  PATIENT DISPOSITION:  PACU - hemodynamically stable.   Delay start of Pharmacological VTE agent (>24hrs) due to surgical blood loss or risk of bleeding: not applicable

## 2012-06-14 NOTE — Anesthesia Preprocedure Evaluation (Addendum)
Anesthesia Evaluation  Patient identified by MRN, date of birth, ID band Patient awake    Reviewed: Allergy & Precautions, H&P , NPO status , Patient's Chart, lab work & pertinent test results  Airway Mallampati: II TM Distance: >3 FB Neck ROM: Full    Dental  (+) Teeth Intact and Dental Advisory Given   Pulmonary neg pulmonary ROS,    Pulmonary exam normal       Cardiovascular hypertension, Pt. on home beta blockers and Pt. on medications + CAD, + Past MI and + Cardiac Stents  Nl ef in 2007   Neuro/Psych negative neurological ROS  negative psych ROS   GI/Hepatic Neg liver ROS,   Endo/Other  negative endocrine ROS  Renal/GU Renal InsufficiencyRenal disease     Musculoskeletal   Abdominal   Peds  Hematology   Anesthesia Other Findings   Reproductive/Obstetrics                         Anesthesia Physical Anesthesia Plan  ASA: III  Anesthesia Plan: General   Post-op Pain Management:    Induction: Intravenous  Airway Management Planned: LMA  Additional Equipment:   Intra-op Plan:   Post-operative Plan: Extubation in OR  Informed Consent: I have reviewed the patients History and Physical, chart, labs and discussed the procedure including the risks, benefits and alternatives for the proposed anesthesia with the patient or authorized representative who has indicated his/her understanding and acceptance.   Dental advisory given  Plan Discussed with: CRNA, Anesthesiologist and Surgeon  Anesthesia Plan Comments:        Anesthesia Quick Evaluation

## 2012-06-14 NOTE — Preoperative (Signed)
Beta Blockers   Reason not to administer Beta Blockers:Not Applicable 

## 2012-06-14 NOTE — H&P (Signed)
[name] is an 78 y.o. male.   Chief Complaint:  Here for elective RIH repair HPI: He has a bothersome RIH and presents for elective repair.  He has been cleared by Cardiology to have the procedure.  Past Medical History  Diagnosis Date  . Coronary atherosclerosis of unspecified type of vessel, native or graft   . Dizziness and giddiness   . Chest pain, unspecified   . Myocardial infarction   . Shortness of breath     exertion  . Hypertension   . Arthritis     Past Surgical History  Procedure Date  . Sinus surgery with instatrak 1985  . Coronary angioplasty     4 stents    Family History  Problem Relation Age of Onset  . Heart attack Father 49   Social History:  reports that he quit smoking about 30 years ago. He does not have any smokeless tobacco history on file. He reports that he does not drink alcohol. His drug history not on file.  Allergies: No Known Allergies  Medications Prior to Admission  Medication Sig Dispense Refill  . aspirin 325 MG EC tablet Take 325 mg by mouth daily.       . clopidogrel (PLAVIX) 75 MG tablet Take 75 mg by mouth daily.      Marland Kitchen lisinopril (PRINIVIL,ZESTRIL) 40 MG tablet Take 40 mg by mouth daily.      . metoprolol (LOPRESSOR) 50 MG tablet Take 50 mg by mouth 2 (two) times daily.      . STUDY MEDICATION as directed. IMPROVE-IT STUDY VIA [place]      . nitroGLYCERIN (NITROSTAT) 0.4 MG SL tablet Place 0.4 mg under the tongue every 5 (five) minutes as needed. For chest pain        No results found for this or any previous visit (from the past 48 hour(s)). No results found.  Review of Systems  Constitutional: Negative for fever and chills.  Respiratory: Negative for cough.   Gastrointestinal: Negative for nausea and vomiting.    Blood pressure 139/69, pulse 73, temperature 97.9 F (36.6 C), temperature source Oral, resp. rate 20, SpO2 97.00%. Physical Exam  Constitutional: He appears well-developed and well-nourished. No distress.   Cardiovascular: Normal rate and regular rhythm.   Respiratory: Effort normal and breath sounds normal.  GI: Soft.  Genitourinary:       Reducible right inguinal bulge.     Assessment/Plan Right inguinal hernia (RIH)  Plan:  RIH repair with mesh.  Procedure, risks, and aftercare explained to him pre op.  Schylar Wuebker J 06/14/2012, 7:49 AM

## 2012-06-14 NOTE — Transfer of Care (Signed)
Immediate Anesthesia Transfer of Care Note  Patient: [name]  Procedure(s) Performed: Procedure(s) (LRB) with comments: HERNIA REPAIR INGUINAL ADULT (Right) INSERTION OF MESH (Right)  Patient Location: PACU  Anesthesia Type:General  Level of Consciousness: awake and alert   Airway & Oxygen Therapy: Patient Spontanous Breathing and Patient connected to nasal cannula oxygen  Post-op Assessment: Report given to PACU RN and Post -op Vital signs reviewed and stable  Post vital signs: Reviewed and stable  Complications: No apparent anesthesia complications

## 2012-06-14 NOTE — Anesthesia Postprocedure Evaluation (Signed)
Anesthesia Post Note  Patient: [name]  Procedure(s) Performed: Procedure(s) (LRB): HERNIA REPAIR INGUINAL ADULT (Right) INSERTION OF MESH (Right)  Anesthesia type: general  Patient location: PACU  Post pain: Pain level controlled  Post assessment: Patient's Cardiovascular Status Stable  Last Vitals:  Filed Vitals:   06/14/12 0900  BP: 126/58  Pulse:   Temp: 36.3 C  Resp:     Post vital signs: Reviewed and stable  Level of consciousness: sedated  Complications: No apparent anesthesia complications

## 2012-06-14 NOTE — Op Note (Signed)
Preoperative diagnosis:  Right inguinal hernia.  Postoperative diagnosis:  Same (Direct)  Procedure:  Right inguinal hernia repair with mesh.  Surgeon:  Avel Peace, M.D.  Anesthesia:  General/LMA with local (Marcaine).  Indication:  This is an 78 year old male with a moderate sized, symptomatic right inguinal hernia who presents for elective repair.  Technique:  He was seen in the holding room and the right groin was marked with my initials. The patient was brought to the operating, placed supine on the operating table, and the anesthetic was administered by the anesthesiologist. The hair in the groin area was clipped as was felt to be necessary. This area was then sterilely prepped and draped.  Local anesthetic was infiltrated in the superficial and deep tissues in the right groin.  An incision was made through the skin and subcutaneous tissue until the external oblique aponeurosis was identified.  Local anesthetic was infiltrated deep to the external oblique aponeurosis. The external oblique aponeurosis was divided through the external ring medially and back toward the anterior superior iliac spine laterally. Using blunt dissection, the shelving edge of the inguinal ligament was identified inferiorly and the internal oblique aponeurosis and muscle were identified superiorly. The ilioinguinal nerve was identified and preserved.  The spermatic cord was isolated and a posterior window was made around it. The direct hernia sac was identified and separated from the spermatic cord using blunt dissection. The hernia sac and its contents were reduced through the direct hernia defect.   A piece of 3" x 6" polypropylene mesh was brought into the field and anchored 1-2 cm medial to the pubic tubercle with 2-0 Prolene suture. The inferior aspect of the mesh was anchored to the shelving edge of the inguinal ligament with running 2-0 Prolene suture to a level 1-2 cm lateral to the internal ring. A slit  was cut in the mesh creating 2 tails. These were wrapped around the spermatic cord. The superior aspect of the mesh was anchored to the internal oblique aponeurosis and muscle with interrupted 2-0 Vicryl sutures. The 2 tails of the mesh were then crossed creating a new internal ring and were anchored to the shelving edge of the inguinal ligament with 2-0 Prolene suture. The tip of a hemostat could be placed through the new aperture. The lateral aspect of the mesh was then tucked deep to the external oblique aponeurosis.  The wound was inspected and hemostasis was adequate. The external oblique aponeurosis was then closed over the mesh and cord with running 3-0 Vicryl suture. The subcutaneous tissue was closed with running 3-0 Vicryl suture. The skin closed with a running 4-0 Monocryl subcuticular stitch.  Steri-Strips and a sterile dressing were applied.  The procedure was well-tolerated without any apparent complications and the patient was taken to the recovery room in satisfactory condition.

## 2012-06-17 ENCOUNTER — Encounter (HOSPITAL_COMMUNITY): Payer: Self-pay | Admitting: General Surgery

## 2012-07-02 ENCOUNTER — Other Ambulatory Visit: Payer: Self-pay

## 2012-07-02 MED ORDER — METOPROLOL TARTRATE 50 MG PO TABS: 50.0000 mg | ORAL_TABLET | Freq: Two times a day (BID) | ORAL | Status: DC

## 2012-07-19 ENCOUNTER — Ambulatory Visit (INDEPENDENT_AMBULATORY_CARE_PROVIDER_SITE_OTHER): Payer: Medicare Other | Admitting: General Surgery

## 2012-07-19 ENCOUNTER — Encounter (INDEPENDENT_AMBULATORY_CARE_PROVIDER_SITE_OTHER): Payer: Self-pay | Admitting: General Surgery

## 2012-07-19 VITALS — BP 132/88 | HR 76 | Temp 97.7°F | Resp 18 | Ht 71.0 in | Wt 176.8 lb

## 2012-07-19 DIAGNOSIS — Z9889 Other specified postprocedural states: Secondary | ICD-10-CM

## 2012-07-19 NOTE — Patient Instructions (Signed)
In one week resume normal activities as tolerated as we discussed.

## 2012-07-19 NOTE — Progress Notes (Signed)
He presents for postop followup after open Right inguinal hernia repair with mesh 06/14/12.  Post op pain is minimal.  No difficulty voiding or having BMs.  Swelling is decreasing.  P.E.  GU:  Right groin incision clean/dry/intact, swelling is minimal, repair is solid.  Assessment:  Doing well post hernia repair.  Plan:  Continue light activities for one more week then resume activities as tolerated.  Return prn.

## 2012-08-14 ENCOUNTER — Other Ambulatory Visit: Payer: Self-pay | Admitting: Cardiology

## 2012-08-14 ENCOUNTER — Other Ambulatory Visit: Payer: Self-pay | Admitting: *Deleted

## 2012-08-14 MED ORDER — CLOPIDOGREL BISULFATE 75 MG PO TABS: 75.0000 mg | ORAL_TABLET | Freq: Every day | ORAL | Status: DC

## 2012-09-05 ENCOUNTER — Encounter (INDEPENDENT_AMBULATORY_CARE_PROVIDER_SITE_OTHER): Payer: Medicare Other

## 2012-09-05 DIAGNOSIS — R0989 Other specified symptoms and signs involving the circulatory and respiratory systems: Secondary | ICD-10-CM

## 2012-12-03 ENCOUNTER — Encounter (INDEPENDENT_AMBULATORY_CARE_PROVIDER_SITE_OTHER): Payer: Medicare Other

## 2012-12-03 DIAGNOSIS — R0989 Other specified symptoms and signs involving the circulatory and respiratory systems: Secondary | ICD-10-CM

## 2012-12-04 ENCOUNTER — Telehealth: Payer: Self-pay | Admitting: *Deleted

## 2012-12-04 MED ORDER — SIMVASTATIN 40 MG PO TABS: 40.0000 mg | ORAL_TABLET | Freq: Every day | ORAL | Status: DC

## 2012-12-04 NOTE — Telephone Encounter (Signed)
[name] calls today for refill order on Zocor 40mg  qhs. Improve research Study is ending after 8 years where his lipids have been managed Dr. Daleen Squibb agrees with medication.  Prescription sent in. Mylo Red RN

## 2013-12-26 ENCOUNTER — Other Ambulatory Visit: Payer: Self-pay | Admitting: Cardiology

## 2014-01-01 ENCOUNTER — Other Ambulatory Visit: Payer: Self-pay | Admitting: *Deleted

## 2014-03-02 ENCOUNTER — Ambulatory Visit (INDEPENDENT_AMBULATORY_CARE_PROVIDER_SITE_OTHER): Payer: Medicare Other | Admitting: Cardiovascular Disease

## 2014-03-02 ENCOUNTER — Encounter: Payer: Self-pay | Admitting: Cardiovascular Disease

## 2014-03-02 VITALS — BP 144/64 | HR 56 | Ht 71.0 in | Wt 190.8 lb

## 2014-03-02 DIAGNOSIS — I2510 Atherosclerotic heart disease of native coronary artery without angina pectoris: Secondary | ICD-10-CM

## 2014-03-02 DIAGNOSIS — I10 Essential (primary) hypertension: Secondary | ICD-10-CM

## 2014-03-02 MED ORDER — CLOPIDOGREL BISULFATE 75 MG PO TABS: 75.0000 mg | ORAL_TABLET | Freq: Every day | ORAL | Status: DC

## 2014-03-02 MED ORDER — LISINOPRIL 20 MG PO TABS: 20.0000 mg | ORAL_TABLET | Freq: Every day | ORAL | Status: DC

## 2014-03-02 NOTE — Patient Instructions (Addendum)
Your physician wants you to follow-up in:  12 months.  You will receive a reminder letter in the mail two months in advance. If you don't receive a letter, please call our office to schedule the follow-up appointment.  Your physician has recommended you make the following change in your medication:   Start Clopidogrel 75 mg by mouth daily. Start Lisinopril 20 mg by mouth daily.

## 2014-03-02 NOTE — Progress Notes (Signed)
History of Present Illness: 80 yo male with history of CAD, HTN who is here today for cardiac follow up. He has been followed by Dr. Verl Blalock. I am meeting him for the first time today. He has had placement of drug eluting stents in the proximal and mid Circumflex (2.5 x 16 mm Taxus, 2.5 x 16 mm Taxus) and mid LAD (3.0 x 16 mm Taxus) in March 2007. LVEF=60% by cath in 2007. No cath or assessment of LVEF since then.   He is here today for follow up. He denies any chest pain, SOB, palpitations, near syncope or syncope. He is very active. Plavix, Lisinopril and Toprol have fallen off of his list and he is not sure why Dr. Verl Blalock stopped these medications.   Primary Care Physician: None  Last Lipid Profile:Lipid Panel     Component Value Date/Time   CHOL 152 02/16/2012 1245   TRIG 99.0 02/16/2012 1245   HDL 44.20 02/16/2012 1245   CHOLHDL 3 02/16/2012 1245   VLDL 19.8 02/16/2012 1245   [place] 88 02/16/2012 1245     Past Medical History  Diagnosis Date  . Coronary atherosclerosis of unspecified type of vessel, native or graft   . Dizziness and giddiness   . Chest pain, unspecified   . Myocardial infarction   . Shortness of breath     exertion  . Hypertension   . Arthritis     Past Surgical History  Procedure Laterality Date  . Sinus surgery with instatrak  1985  . Coronary angioplasty      4 stents  . Inguinal hernia repair  06/14/2012    Procedure: HERNIA REPAIR INGUINAL ADULT;  Surgeon: Odis Hollingshead, MD;  Location: [facility];  Service: General;  Laterality: Right;  . Insertion of mesh  06/14/2012    Procedure: INSERTION OF MESH;  Surgeon: Odis Hollingshead, MD;  Location: [facility];  Service: General;  Laterality: Right;    Current Outpatient Prescriptions  Medication Sig Dispense Refill  . aspirin 325 MG EC tablet Take 325 mg by mouth daily.       . nitroGLYCERIN (NITROSTAT) 0.4 MG SL tablet Place 0.4 mg under the tongue every 5 (five) minutes as needed for chest pain.      . simvastatin  (ZOCOR) 40 MG tablet Take 40 mg by mouth at bedtime.      . tamsulosin (FLOMAX) 0.4 MG CAPS capsule Take 0.4 mg by mouth daily after supper.       No current facility-administered medications for this visit.    No Known Allergies  History   Social History  . Marital Status: Divorced    Spouse Name: N/A    Number of Children: 3  . Years of Education: N/A   Occupational History  . [employer]   Social History Main Topics  . Smoking status: Former Smoker    Quit date: 07/10/1981  . Smokeless tobacco: Not on file  . Alcohol Use: No  . Drug Use: No  . Sexual Activity: Not on file   Other Topics Concern  . Not on file   Social History Narrative   Regular exercise          Family History  Problem Relation Age of Onset  . Heart attack Father 90    Review of Systems:  As stated in the HPI and otherwise negative.   BP 144/64  Pulse 56  Ht 5\' 11"  (1.803  m)  Wt 190 lb 12.8 oz (86.546 kg)  BMI 26.62 kg/m2  Physical Examination: General: Well developed, well nourished, NAD HEENT: OP clear, mucus membranes moist SKIN: warm, dry. No rashes. Neuro: No focal deficits Musculoskeletal: Muscle strength 5/5 all ext Psychiatric: Mood and affect normal Neck: No JVD, no carotid bruits, no thyromegaly, no lymphadenopathy. Lungs:Clear bilaterally, no wheezes, rhonci, crackles Cardiovascular: Regular rate and rhythm. No murmurs, gallops or rubs. Abdomen:Soft. Bowel sounds present. Non-tender.  Extremities: No lower extremity edema. Pulses are 2 + in the bilateral DP/PT.  EKG: Sinus brady, rate 56 bpm. LBBB  Cardiac cath March 2007:  Left main had a 20% stenosis.  LAD was a long vessel which wrapped the apex. It gave off a very large  branching first diagonal and a tiny second diagonal. The mid-LAD at the  takeoff of a small second diagonal and large septal perforator there was  tubular stenosis which appeared to be 70-80%.  The left  circumflex was a moderate-sized vessel. It gave off a moderate-  sized ramus, small to moderate OM-1 and a moderate-sized OM-2. There was a  99% stenosis in the mid-left circumflex prior to the takeoff of the OM-1. In  the proximal portion of the OM-2, there was a 90% stenosis. There was a 50%  stenosis in the distal OM-2.  Right coronary was a large dominant vessel. It gave off a large PDA, one  small PL, and a moderate-sized PL. There was a 50% tubular lesion in the mid-  portion. There was a 30% lesion around the distal bend.  Left ventriculogram done in the RAO position showed an EF of 55-60% with a  question of mild anterior hypokinesis. In the lateral projection, the EF was  60% with no evidence of a lateral wall motion abnormality.   Assessment and Plan:   1. CAD: Stable. He is on an ASA and statin. Dr. Verl Blalock stopped his Plavix last year. Given his first generation stents, will restart Plavix 75 mg daily. No beta blocker with bradycardia. Will restart Lisinopril 20 mg daily. He has had no recent chest pains. Will have him lower his dose of ASA to 81 mg daily.   2. HTN: BP slightly elevated today. Will restart Lisinopril 20 mg po Qdaily.

## 2014-10-12 ENCOUNTER — Other Ambulatory Visit: Payer: Self-pay | Admitting: Cardiovascular Disease

## 2014-10-12 DIAGNOSIS — E785 Hyperlipidemia, unspecified: Secondary | ICD-10-CM

## 2014-10-12 NOTE — Telephone Encounter (Signed)
Ok to refill? I do not see where Dr Angelena Form has checked patients lipids recently. Please advise. Thanks, MI

## 2014-10-12 NOTE — Telephone Encounter (Signed)
Pt was previously in research study.  I spoke with pt and study has ended. Primary care does not check his lipids.  I will refill pt's Zocor and he will come in on October 21, 2014 for fasting lipid and liver profiles.

## 2014-10-21 ENCOUNTER — Other Ambulatory Visit (INDEPENDENT_AMBULATORY_CARE_PROVIDER_SITE_OTHER): Payer: Medicare Other | Admitting: *Deleted

## 2014-10-21 DIAGNOSIS — E785 Hyperlipidemia, unspecified: Secondary | ICD-10-CM | POA: Diagnosis not present

## 2014-10-21 LAB — HEPATIC FUNCTION PANEL
ALK PHOS: 56 U/L (ref 39–117)
ALT: 14 U/L (ref 0–53)
AST: 16 U/L (ref 0–37)
Albumin: 4 g/dL (ref 3.5–5.2)
BILIRUBIN DIRECT: 0.1 mg/dL (ref 0.0–0.3)
TOTAL PROTEIN: 7.6 g/dL (ref 6.0–8.3)
Total Bilirubin: 0.8 mg/dL (ref 0.2–1.2)

## 2014-10-21 LAB — LIPID PANEL
CHOLESTEROL: 189 mg/dL (ref 0–200)
HDL: 42.7 mg/dL (ref >39.00)
LDL Cholesterol: 125 mg/dL — ABNORMAL HIGH (ref 0–99)
NONHDL: 146.3
Total CHOL/HDL Ratio: 4
Triglycerides: 108 mg/dL (ref 0.0–149.0)
VLDL: 21.6 mg/dL (ref 0.0–40.0)

## 2014-10-26 ENCOUNTER — Other Ambulatory Visit: Payer: Self-pay | Admitting: *Deleted

## 2014-10-26 DIAGNOSIS — E785 Hyperlipidemia, unspecified: Secondary | ICD-10-CM

## 2014-11-18 DIAGNOSIS — N401 Enlarged prostate with lower urinary tract symptoms: Secondary | ICD-10-CM | POA: Diagnosis not present

## 2014-11-23 DIAGNOSIS — N138 Other obstructive and reflux uropathy: Secondary | ICD-10-CM | POA: Diagnosis not present

## 2014-11-23 DIAGNOSIS — R972 Elevated prostate specific antigen [PSA]: Secondary | ICD-10-CM | POA: Diagnosis not present

## 2014-11-23 DIAGNOSIS — N401 Enlarged prostate with lower urinary tract symptoms: Secondary | ICD-10-CM | POA: Diagnosis not present

## 2014-12-29 ENCOUNTER — Other Ambulatory Visit (INDEPENDENT_AMBULATORY_CARE_PROVIDER_SITE_OTHER): Payer: Medicare Other | Admitting: *Deleted

## 2014-12-29 DIAGNOSIS — E785 Hyperlipidemia, unspecified: Secondary | ICD-10-CM | POA: Diagnosis not present

## 2014-12-29 LAB — LIPID PANEL
CHOLESTEROL: 131 mg/dL (ref 0–200)
HDL: 31.9 mg/dL — AB (ref >39.00)
LDL CALC: 81 mg/dL (ref 0–99)
NonHDL: 99.1
TRIGLYCERIDES: 89 mg/dL (ref 0.0–149.0)
Total CHOL/HDL Ratio: 4
VLDL: 17.8 mg/dL (ref 0.0–40.0)

## 2014-12-29 NOTE — Addendum Note (Signed)
Addended by: Eulis Foster on: 12/29/2014 07:57 AM   Modules accepted: Orders

## 2014-12-30 ENCOUNTER — Telehealth: Payer: Self-pay | Admitting: Cardiovascular Disease

## 2014-12-30 NOTE — Telephone Encounter (Signed)
New message  ° ° °Patient calling for test results.   °

## 2014-12-30 NOTE — Telephone Encounter (Signed)
Spoke with pt and reviewed lipid results with him

## 2015-03-08 ENCOUNTER — Ambulatory Visit (INDEPENDENT_AMBULATORY_CARE_PROVIDER_SITE_OTHER): Payer: Medicare Other | Admitting: Cardiovascular Disease

## 2015-03-08 ENCOUNTER — Encounter: Payer: Self-pay | Admitting: Cardiovascular Disease

## 2015-03-08 VITALS — BP 140/60 | HR 66 | Ht 71.0 in | Wt 187.0 lb

## 2015-03-08 DIAGNOSIS — I2510 Atherosclerotic heart disease of native coronary artery without angina pectoris: Secondary | ICD-10-CM | POA: Diagnosis not present

## 2015-03-08 DIAGNOSIS — I10 Essential (primary) hypertension: Secondary | ICD-10-CM | POA: Diagnosis not present

## 2015-03-08 NOTE — Patient Instructions (Signed)
Medication Instructions:  Your physician recommends that you continue on your current medications as directed. Please refer to the Current Medication list given to you today.   Labwork: none  Testing/Procedures: none  Follow-Up: Your physician wants you to follow-up in:  12 months.  You will receive a reminder letter in the mail two months in advance. If you don't receive a letter, please call our office to schedule the follow-up appointment.        

## 2015-03-08 NOTE — Progress Notes (Signed)
Chief Complaint  Patient presents with  . Follow-up     History of Present Illness: 81 yo male with history of CAD, HTN who is here today for cardiac follow up. He has been followed by Dr. Verl Blalock. He has had placement of drug eluting stents in the proximal and mid Circumflex (2.5 x 16 mm Taxus, 2.5 x 16 mm Taxus) and mid LAD (3.0 x 16 mm Taxus) in March 2007. LVEF=60% by cath in 2007. No cath or assessment of LVEF since then.   He is here today for follow up. He denies any chest pain, SOB, palpitations, near syncope or syncope. He is very active.   Primary Care Physician: None  Last Lipid Profile:Lipid Panel     Component Value Date/Time   CHOL 131 12/29/2014 0757   TRIG 89.0 12/29/2014 0757   HDL 31.90* 12/29/2014 0757   CHOLHDL 4 12/29/2014 0757   VLDL 17.8 12/29/2014 0757   LDLCALC 81 12/29/2014 0757     Past Medical History  Diagnosis Date  . Coronary atherosclerosis of unspecified type of vessel, native or graft   . Dizziness and giddiness   . Chest pain, unspecified   . Myocardial infarction   . Shortness of breath     exertion  . Hypertension   . Arthritis     Past Surgical History  Procedure Laterality Date  . Sinus surgery with instatrak  1985  . Coronary angioplasty      4 stents  . Inguinal hernia repair  06/14/2012    Procedure: HERNIA REPAIR INGUINAL ADULT;  Surgeon: Odis Hollingshead, MD;  Location: [facility];  Service: General;  Laterality: Right;  . Insertion of mesh  06/14/2012    Procedure: INSERTION OF MESH;  Surgeon: Odis Hollingshead, MD;  Location: [facility];  Service: General;  Laterality: Right;    Current Outpatient Prescriptions  Medication Sig Dispense Refill  . aspirin 81 MG tablet Take 81 mg by mouth daily.    . clopidogrel (PLAVIX) 75 MG tablet Take 1 tablet (75 mg total) by mouth daily. 30 tablet 11  . lisinopril (PRINIVIL,ZESTRIL) 20 MG tablet Take 1 tablet (20 mg total) by mouth daily. 30 tablet 11  . nitroGLYCERIN (NITROSTAT) 0.4 MG SL  tablet Place 0.4 mg under the tongue every 5 (five) minutes as needed for chest pain.    . simvastatin (ZOCOR) 40 MG tablet Take 40 mg by mouth daily.    . tamsulosin (FLOMAX) 0.4 MG CAPS capsule Take 0.4 mg by mouth daily after supper.     No current facility-administered medications for this visit.    No Known Allergies  Social History   Social History  . Marital Status: Divorced    Spouse Name: N/A  . Number of Children: 3  . Years of Education: N/A   Occupational History  . [employer]   Social History Main Topics  . Smoking status: Former Smoker    Quit date: 07/10/1981  . Smokeless tobacco: Not on file  . Alcohol Use: No  . Drug Use: No  . Sexual Activity: Not on file   Other Topics Concern  . Not on file   Social History Narrative   Regular exercise          Family History  Problem Relation Age of Onset  . Heart attack Father 57    Review of Systems:  As stated in the HPI and otherwise negative.   BP  140/60 mmHg  Pulse 66  Ht '5\' 11"'$  (1.803 m)  Wt 187 lb (84.823 kg)  BMI 26.09 kg/m2  SpO2 95%  Physical Examination: General: Well developed, well nourished, NAD HEENT: OP clear, mucus membranes moist SKIN: warm, dry. No rashes. Neuro: No focal deficits Musculoskeletal: Muscle strength 5/5 all ext Psychiatric: Mood and affect normal Neck: No JVD, no carotid bruits, no thyromegaly, no lymphadenopathy. Lungs:Clear bilaterally, no wheezes, rhonci, crackles Cardiovascular: Regular rate and rhythm. No murmurs, gallops or rubs. Abdomen:Soft. Bowel sounds present. Non-tender.  Extremities: No lower extremity edema. Pulses are 2 + in the bilateral DP/PT.  Cardiac cath March 2007:  Left main had a 20% stenosis.  LAD was a long vessel which wrapped the apex. It gave off a very large  branching first diagonal and a tiny second diagonal. The mid-LAD at the  takeoff of a small second diagonal and large septal perforator  there was  tubular stenosis which appeared to be 70-80%.  The left circumflex was a moderate-sized vessel. It gave off a moderate-  sized ramus, small to moderate OM-1 and a moderate-sized OM-2. There was a  99% stenosis in the mid-left circumflex prior to the takeoff of the OM-1. In  the proximal portion of the OM-2, there was a 90% stenosis. There was a 50%  stenosis in the distal OM-2.  Right coronary was a large dominant vessel. It gave off a large PDA, one  small PL, and a moderate-sized PL. There was a 50% tubular lesion in the mid-  portion. There was a 30% lesion around the distal bend.  Left ventriculogram done in the RAO position showed an EF of 55-60% with a  question of mild anterior hypokinesis. In the lateral projection, the EF was  60% with no evidence of a lateral wall motion abnormality.  EKG:  EKG is ordered today. The ekg ordered today demonstrates NSR, LBBB  Recent Labs: 10/21/2014: ALT 14   Lipid Panel    Component Value Date/Time   CHOL 131 12/29/2014 0757   TRIG 89.0 12/29/2014 0757   HDL 31.90* 12/29/2014 0757   CHOLHDL 4 12/29/2014 0757   VLDL 17.8 12/29/2014 0757   LDLCALC 81 12/29/2014 0757     Wt Readings from Last 3 Encounters:  03/08/15 187 lb (84.823 kg)  03/02/14 190 lb 12.8 oz (86.546 kg)  07/19/12 176 lb 12.8 oz (80.196 kg)     Other studies Reviewed: Additional studies/ records that were reviewed today include: . Review of the above records demonstrates:    Assessment and Plan:   1. CAD: Stable. He is on an ASA, Plavix, Lisinopril and statin. No beta blocker with bradycardia. He has had no recent chest pains. We discussed a stress test but he does not wish to pursue further testing at this time.   2. HTN: BP controlled. No changes.     Current medicines are reviewed at length with the patient today.  The patient does not have concerns regarding medicines.  The following changes have been made:  no change  Labs/ tests ordered  today include:   Orders Placed This Encounter  Procedures  . EKG 12-Lead    Disposition:   FU with me in 12  months  Signed, Lauree Chandler, MD 03/08/2015 4:38 PM    [facility] [place], [place], [place]  [address] Phone: [phone]; Fax: [phone]

## 2015-04-01 ENCOUNTER — Other Ambulatory Visit: Payer: Self-pay | Admitting: Cardiovascular Disease

## 2015-11-09 ENCOUNTER — Other Ambulatory Visit: Payer: Self-pay | Admitting: Cardiovascular Disease

## 2015-11-09 ENCOUNTER — Other Ambulatory Visit: Payer: Self-pay

## 2015-11-09 MED ORDER — SIMVASTATIN 40 MG PO TABS: 40.0000 mg | ORAL_TABLET | Freq: Every day | ORAL | Status: DC

## 2016-04-12 ENCOUNTER — Other Ambulatory Visit: Payer: Self-pay | Admitting: Cardiovascular Disease

## 2016-06-13 NOTE — Progress Notes (Signed)
Chief Complaint  Patient presents with  . Follow-up     History of Present Illness: 82 yo male with history of CAD, HTN who is here today for cardiac follow up. He has been followed by Dr. Verl Blalock. He has had placement of drug eluting stents in the proximal and mid Circumflex (2.5 x 16 mm Taxus, 2.5 x 16 mm Taxus) and mid LAD (3.0 x 16 mm Taxus) in March 2007. LVEF=60% by cath in 2007. No cath or assessment of LVEF since then.   He is here today for follow up. He denies any chest pain, SOB, palpitations, near syncope or syncope. He is very active.   Primary Care Physician: No PCP Per Patient  Past Medical History:  Diagnosis Date  . Arthritis   . Chest pain, unspecified   . Coronary atherosclerosis of unspecified type of vessel, native or graft   . Dizziness and giddiness   . Hypertension   . Myocardial infarction   . Shortness of breath    exertion    Past Surgical History:  Procedure Laterality Date  . CORONARY ANGIOPLASTY     4 stents  . INGUINAL HERNIA REPAIR  06/14/2012   Procedure: HERNIA REPAIR INGUINAL ADULT;  Surgeon: Odis Hollingshead, MD;  Location: [facility];  Service: General;  Laterality: Right;  . INSERTION OF MESH  06/14/2012   Procedure: INSERTION OF MESH;  Surgeon: Odis Hollingshead, MD;  Location: [facility];  Service: General;  Laterality: Right;  . SINUS SURGERY WITH INSTATRAK  1985    Current Outpatient Prescriptions  Medication Sig Dispense Refill  . aspirin 81 MG tablet Take 81 mg by mouth daily.    . clopidogrel (PLAVIX) 75 MG tablet Take 1 tablet (75 mg total) by mouth daily. 30 tablet 1  . lisinopril (PRINIVIL,ZESTRIL) 20 MG tablet Take 1 tablet (20 mg total) by mouth daily. 30 tablet 1  . nitroGLYCERIN (NITROSTAT) 0.4 MG SL tablet Place 0.4 mg under the tongue every 5 (five) minutes as needed for chest pain.    . simvastatin (ZOCOR) 40 MG tablet Take 1 tablet (40 mg total) by mouth daily. 30 tablet 11  . tamsulosin (FLOMAX) 0.4 MG CAPS capsule Take 0.4 mg  by mouth daily after supper.     No current facility-administered medications for this visit.     No Known Allergies  Social History   Social History  . Marital status: Divorced    Spouse name: N/A  . Number of children: 3  . Years of education: N/A   Occupational History  . [employer]   Social History Main Topics  . Smoking status: Former Smoker    Quit date: 07/10/1981  . Smokeless tobacco: Not on file  . Alcohol use No  . Drug use: No  . Sexual activity: Not on file   Other Topics Concern  . Not on file   Social History Narrative   Regular exercise          Family History  Problem Relation Age of Onset  . Heart attack Father 36    Review of Systems:  As stated in the HPI and otherwise negative.   BP 124/70   Pulse 63   Ht '5\' 11"'  (1.803 m)   Wt 186 lb (84.4 kg)   BMI 25.94 kg/m   Physical Examination: General: Well developed, well nourished, NAD  HEENT: OP clear, mucus membranes moist  SKIN: warm, dry. No rashes.  Neuro: No focal deficits  Musculoskeletal: Muscle strength 5/5 all ext  Psychiatric: Mood and affect normal  Neck: No JVD, no carotid bruits, no thyromegaly, no lymphadenopathy.  Lungs:Clear bilaterally, no wheezes, rhonci, crackles Cardiovascular: Regular rate and rhythm. No murmurs, gallops or rubs. Abdomen:Soft. Bowel sounds present. Non-tender.  Extremities: No lower extremity edema. Pulses are 2 + in the bilateral DP/PT.  Cardiac cath March 2007:  Left main had a 20% stenosis.  LAD was a long vessel which wrapped the apex. It gave off a very large  branching first diagonal and a tiny second diagonal. The mid-LAD at the  takeoff of a small second diagonal and large septal perforator there was  tubular stenosis which appeared to be 70-80%.  The left circumflex was a moderate-sized vessel. It gave off a moderate-  sized ramus, small to moderate OM-1 and a moderate-sized OM-2. There was a  99%  stenosis in the mid-left circumflex prior to the takeoff of the OM-1. In  the proximal portion of the OM-2, there was a 90% stenosis. There was a 50%  stenosis in the distal OM-2.  Right coronary was a large dominant vessel. It gave off a large PDA, one  small PL, and a moderate-sized PL. There was a 50% tubular lesion in the mid-  portion. There was a 30% lesion around the distal bend.  Left ventriculogram done in the RAO position showed an EF of 55-60% with a  question of mild anterior hypokinesis. In the lateral projection, the EF was  60% with no evidence of a lateral wall motion abnormality.  EKG:  EKG is ordered today. The ekg ordered today demonstrates NSR, rate 63 bpm. LBBB. T wave abnormality, unchanged.   Recent Labs: No results found for requested labs within last 8760 hours.   Lipid Panel    Component Value Date/Time   CHOL 131 12/29/2014 0757   TRIG 89.0 12/29/2014 0757   HDL 31.90 (L) 12/29/2014 0757   CHOLHDL 4 12/29/2014 0757   VLDL 17.8 12/29/2014 0757   LDLCALC 81 12/29/2014 0757     Wt Readings from Last 3 Encounters:  06/14/16 186 lb (84.4 kg)  03/08/15 187 lb (84.8 kg)  03/02/14 190 lb 12.8 oz (86.5 kg)     Other studies Reviewed: Additional studies/ records that were reviewed today include: . Review of the above records demonstrates:    Assessment and Plan:   1. CAD: Stable. He is on an ASA, Plavix, Lisinopril and statin. No beta blocker with bradycardia. He has had no chest pains. Check lipids, BMET and LFTs today.   2. HTN: BP controlled. No changes.    Current medicines are reviewed at length with the patient today.  The patient does not have concerns regarding medicines.  The following changes have been made:  no change  Labs/ tests ordered today include:   Orders Placed This Encounter  Procedures  . Comp Met (CMET)  . Lipid Profile  . EKG 12-Lead    Disposition:   FU with me in 12  months  Signed, Lauree Chandler,  MD 06/14/2016 11:34 AM    [facility] [place], [place], [place]  [address] Phone: [phone]; Fax: [phone]

## 2016-06-14 ENCOUNTER — Ambulatory Visit (INDEPENDENT_AMBULATORY_CARE_PROVIDER_SITE_OTHER): Payer: Medicare Other | Admitting: Cardiovascular Disease

## 2016-06-14 ENCOUNTER — Encounter: Payer: Self-pay | Admitting: Cardiovascular Disease

## 2016-06-14 VITALS — BP 124/70 | HR 63 | Ht 71.0 in | Wt 186.0 lb

## 2016-06-14 DIAGNOSIS — I2510 Atherosclerotic heart disease of native coronary artery without angina pectoris: Secondary | ICD-10-CM

## 2016-06-14 DIAGNOSIS — I10 Essential (primary) hypertension: Secondary | ICD-10-CM

## 2016-06-14 LAB — COMPREHENSIVE METABOLIC PANEL
ALBUMIN: 3.9 g/dL (ref 3.6–5.1)
ALK PHOS: 55 U/L (ref 40–115)
ALT: 11 U/L (ref 9–46)
AST: 16 U/L (ref 10–35)
BILIRUBIN TOTAL: 0.8 mg/dL (ref 0.2–1.2)
BUN: 29 mg/dL — ABNORMAL HIGH (ref 7–25)
CO2: 26 mmol/L (ref 20–31)
CREATININE: 1.36 mg/dL — AB (ref 0.70–1.11)
Calcium: 9 mg/dL (ref 8.6–10.3)
Chloride: 103 mmol/L (ref 98–110)
Glucose, Bld: 84 mg/dL (ref 65–99)
Potassium: 4.3 mmol/L (ref 3.5–5.3)
SODIUM: 138 mmol/L (ref 135–146)
TOTAL PROTEIN: 6.7 g/dL (ref 6.1–8.1)

## 2016-06-14 LAB — LIPID PANEL
CHOLESTEROL: 172 mg/dL (ref <200)
HDL: 42 mg/dL (ref >40)
LDL CALC: 106 mg/dL — AB (ref <100)
TRIGLYCERIDES: 122 mg/dL (ref <150)
Total CHOL/HDL Ratio: 4.1 Ratio (ref <5.0)
VLDL: 24 mg/dL (ref <30)

## 2016-06-14 NOTE — Patient Instructions (Signed)
Medication Instructions:  Your physician recommends that you continue on your current medications as directed. Please refer to the Current Medication list given to you today.   Labwork: Lab work to be done today--CMET and lipid profile  Testing/Procedures: none  Follow-Up: Your physician wants you to follow-up in: 12 months. Please call our office in about 9 months to schedule this appointment.   Any Other Special Instructions Will Be Listed Below (If Applicable).     If you need a refill on your cardiac medications before your next appointment, please call your pharmacy.

## 2016-11-03 ENCOUNTER — Other Ambulatory Visit: Payer: Self-pay | Admitting: Cardiovascular Disease

## 2016-11-14 ENCOUNTER — Ambulatory Visit (HOSPITAL_COMMUNITY)
Admission: EM | Admit: 2016-11-14 | Discharge: 2016-11-14 | Disposition: A | Discharge status: Nursing Home (Includes ICF) | Payer: Medicare Other | Attending: Internal Medicine | Admitting: Internal Medicine

## 2016-11-14 ENCOUNTER — Ambulatory Visit (INDEPENDENT_AMBULATORY_CARE_PROVIDER_SITE_OTHER): Payer: Medicare Other

## 2016-11-14 ENCOUNTER — Encounter (HOSPITAL_COMMUNITY): Payer: Self-pay | Admitting: Emergency Medicine

## 2016-11-14 ENCOUNTER — Emergency Department (HOSPITAL_COMMUNITY): Payer: Medicare Other

## 2016-11-14 ENCOUNTER — Emergency Department (HOSPITAL_COMMUNITY)
Admission: EM | Admit: 2016-11-14 | Discharge: 2016-11-15 | Disposition: A | Discharge status: Home / Self Care | Payer: Medicare Other | Attending: Emergency Medicine | Admitting: Emergency Medicine

## 2016-11-14 DIAGNOSIS — K567 Ileus, unspecified: Secondary | ICD-10-CM | POA: Diagnosis not present

## 2016-11-14 DIAGNOSIS — R1010 Upper abdominal pain, unspecified: Secondary | ICD-10-CM

## 2016-11-14 DIAGNOSIS — R1084 Generalized abdominal pain: Secondary | ICD-10-CM

## 2016-11-14 DIAGNOSIS — I252 Old myocardial infarction: Secondary | ICD-10-CM | POA: Insufficient documentation

## 2016-11-14 DIAGNOSIS — Z955 Presence of coronary angioplasty implant and graft: Secondary | ICD-10-CM | POA: Insufficient documentation

## 2016-11-14 DIAGNOSIS — Z87891 Personal history of nicotine dependence: Secondary | ICD-10-CM | POA: Insufficient documentation

## 2016-11-14 DIAGNOSIS — I10 Essential (primary) hypertension: Secondary | ICD-10-CM | POA: Diagnosis not present

## 2016-11-14 DIAGNOSIS — Z79899 Other long term (current) drug therapy: Secondary | ICD-10-CM | POA: Diagnosis not present

## 2016-11-14 DIAGNOSIS — R1011 Right upper quadrant pain: Secondary | ICD-10-CM | POA: Diagnosis not present

## 2016-11-14 DIAGNOSIS — Z7982 Long term (current) use of aspirin: Secondary | ICD-10-CM | POA: Insufficient documentation

## 2016-11-14 DIAGNOSIS — I2510 Atherosclerotic heart disease of native coronary artery without angina pectoris: Secondary | ICD-10-CM | POA: Diagnosis not present

## 2016-11-14 DIAGNOSIS — R109 Unspecified abdominal pain: Secondary | ICD-10-CM | POA: Diagnosis not present

## 2016-11-14 LAB — COMPREHENSIVE METABOLIC PANEL
ALT: 12 U/L — AB (ref 17–63)
AST: 17 U/L (ref 15–41)
Albumin: 4.1 g/dL (ref 3.5–5.0)
Alkaline Phosphatase: 67 U/L (ref 38–126)
Anion gap: 9 (ref 5–15)
BUN: 24 mg/dL — AB (ref 6–20)
CHLORIDE: 103 mmol/L (ref 101–111)
CO2: 25 mmol/L (ref 22–32)
CREATININE: 1.3 mg/dL — AB (ref 0.61–1.24)
Calcium: 9.3 mg/dL (ref 8.9–10.3)
GFR calc Af Amer: 56 mL/min — ABNORMAL LOW (ref >60)
GFR, EST NON AFRICAN AMERICAN: 49 mL/min — AB (ref >60)
Glucose, Bld: 130 mg/dL — ABNORMAL HIGH (ref 65–99)
Potassium: 4.3 mmol/L (ref 3.5–5.1)
Sodium: 137 mmol/L (ref 135–145)
TOTAL PROTEIN: 8 g/dL (ref 6.5–8.1)
Total Bilirubin: 1.8 mg/dL — ABNORMAL HIGH (ref 0.3–1.2)

## 2016-11-14 LAB — CBC
HCT: 47.2 % (ref 39.0–52.0)
Hemoglobin: 15.6 g/dL (ref 13.0–17.0)
MCH: 28.8 pg (ref 26.0–34.0)
MCHC: 33.1 g/dL (ref 30.0–36.0)
MCV: 87.1 fL (ref 78.0–100.0)
PLATELETS: 199 10*3/uL (ref 150–400)
RBC: 5.42 MIL/uL (ref 4.22–5.81)
RDW: 13.8 % (ref 11.5–15.5)
WBC: 12.1 10*3/uL — AB (ref 4.0–10.5)

## 2016-11-14 LAB — URINALYSIS, ROUTINE W REFLEX MICROSCOPIC
Bacteria, UA: NONE SEEN
Bilirubin Urine: NEGATIVE
GLUCOSE, UA: NEGATIVE mg/dL
Hgb urine dipstick: NEGATIVE
KETONES UR: 5 mg/dL — AB
LEUKOCYTES UA: NEGATIVE
Nitrite: NEGATIVE
PH: 6 (ref 5.0–8.0)
Protein, ur: 100 mg/dL — AB
SPECIFIC GRAVITY, URINE: 1.039 — AB (ref 1.005–1.030)
SQUAMOUS EPITHELIAL / LPF: NONE SEEN

## 2016-11-14 LAB — LIPASE, BLOOD: Lipase: 67 U/L — ABNORMAL HIGH (ref 11–51)

## 2016-11-14 MED ORDER — IOPAMIDOL (ISOVUE-300) INJECTION 61%
INTRAVENOUS | Status: AC
  Administered 2016-11-14: 80 mL
  Filled 2016-11-14: qty 100

## 2016-11-14 MED ORDER — SODIUM CHLORIDE 0.9 % IV BOLUS (SEPSIS)
500.0000 mL | Freq: Once | INTRAVENOUS | Status: AC
  Administered 2016-11-14: 500 mL via INTRAVENOUS

## 2016-11-14 NOTE — ED Notes (Signed)
Patient transported to CT 

## 2016-11-14 NOTE — ED Notes (Signed)
Pt encouraged to provide urine specimen.  

## 2016-11-14 NOTE — ED Provider Notes (Signed)
[facility] DEPT Provider Note   CSN: [id number] Arrival date & time: 11/14/16  1458     History   Chief Complaint Chief Complaint  Patient presents with  . bowel obstruction  . Abdominal Pain    HPI [name] is a 83 y.o. male.  HPI  Pt presenting with c/o abdominal pain.  Pain has been occurring in the right upper and mid upper abdomen - he states pain has been ongoing over the past couple of weeks but much worse over the past couple of days.  No fever/chills.  No vomiting or change in stools.  He denies worsening after eating.  He was seen at urgent care- xray showed ileus so he was sent to the ED for further evaluation.  Pt currently denies any pain.  There are no other associated systemic symptoms, there are no other alleviating or modifying factors.   Past Medical History:  Diagnosis Date  . Arthritis   . Chest pain, unspecified   . Coronary atherosclerosis of unspecified type of vessel, native or graft   . Dizziness and giddiness   . Hypertension   . Myocardial infarction ([place])   . Shortness of breath    exertion    Patient Active Problem List   Diagnosis Date Noted  . HYPERLIPIDEMIA-MIXED 11/09/2009  . LBBB 11/09/2009  . HYPERTENSION, BENIGN 11/27/2008  . CAD, UNSPECIFIED SITE 10/05/2008  . DIZZINESS 10/05/2008  . CHEST PAIN-UNSPECIFIED 10/05/2008    Past Surgical History:  Procedure Laterality Date  . CORONARY ANGIOPLASTY     4 stents  . INGUINAL HERNIA REPAIR  06/14/2012   Procedure: HERNIA REPAIR INGUINAL ADULT;  Surgeon: Odis Hollingshead, MD;  Location: [facility];  Service: General;  Laterality: Right;  . INSERTION OF MESH  06/14/2012   Procedure: INSERTION OF MESH;  Surgeon: Odis Hollingshead, MD;  Location: [facility];  Service: General;  Laterality: Right;  . [place] Medications    Prior to Admission medications   Medication Sig Start Date End Date Taking? Authorizing Provider  aspirin 81 MG tablet Take  81 mg by mouth daily.    [provider]  clopidogrel (PLAVIX) 75 MG tablet TAKE ONE TABLET EACH DAY 11/03/16   Burnell Blanks, MD  lisinopril (PRINIVIL,ZESTRIL) 20 MG tablet TAKE ONE TABLET EACH DAY 11/03/16   Burnell Blanks, MD  nitroGLYCERIN (NITROSTAT) 0.4 MG SL tablet Place 0.4 mg under the tongue every 5 (five) minutes as needed for chest pain.    [provider]  simvastatin (ZOCOR) 40 MG tablet Take 1 tablet (40 mg total) by mouth daily. 11/09/15   Burnell Blanks, MD  tamsulosin (FLOMAX) 0.4 MG CAPS capsule Take 0.4 mg by mouth daily after supper.    [provider]    Family History Family History  Problem Relation Age of Onset  . Heart attack Father 68    Social History Social History  Substance Use Topics  . Smoking status: Former Smoker    Quit date: 07/10/1981  . Smokeless tobacco: Never Used  . Alcohol use No     Allergies   Patient has no known allergies.   Review of Systems Review of Systems  ROS reviewed and all otherwise negative except for mentioned in HPI   Physical Exam Updated Vital Signs BP 125/65   Pulse 79   Temp 97.6 F (36.4 C) (Oral)   Resp 18   Ht 5'  11" (1.803 m)   Wt 84.4 kg   SpO2 93%   BMI 25.94 kg/m  Vitals reviewed Physical Exam Physical Examination: General appearance - alert, well appearing, and in no distress Mental status - alert, oriented to person, place, and time Eyes - no conjunctival injection, no scleral icterus Mouth - mucous membranes moist, pharynx normal without lesions Neck - supple, no significant adenopathy Chest - clear to auscultation, no wheezes, rales or rhonchi, symmetric air entry Heart - normal rate, regular rhythm, normal S1, S2, no murmurs, rubs, clicks or gallops Abdomen - soft, mild ttp in right upper abdomen, no gaurding or rebound tenderness, nabs, nondistended, no masses or organomegaly Neurological - alert, oriented, normal speech Extremities -  peripheral pulses normal, no pedal edema, no clubbing or cyanosis Skin - normal coloration and turgor, no rashes  ED Treatments / Results  Labs (all labs ordered are listed, but only abnormal results are displayed) Labs Reviewed  LIPASE, BLOOD - Abnormal; Notable for the following:       Result Value   Lipase 67 (*)    All other components within normal limits  COMPREHENSIVE METABOLIC PANEL - Abnormal; Notable for the following:    Glucose, Bld 130 (*)    BUN 24 (*)    Creatinine, Ser 1.30 (*)    ALT 12 (*)    Total Bilirubin 1.8 (*)    GFR calc non Af Amer 49 (*)    GFR calc Af Amer 56 (*)    All other components within normal limits  CBC - Abnormal; Notable for the following:    WBC 12.1 (*)    All other components within normal limits  URINALYSIS, ROUTINE W REFLEX MICROSCOPIC - Abnormal; Notable for the following:    Specific Gravity, Urine 1.039 (*)    Ketones, ur 5 (*)    Protein, ur 100 (*)    All other components within normal limits    EKG  EKG Interpretation None       Radiology Ct Abdomen Pelvis W Contrast  Result Date: 11/14/2016 CLINICAL DATA:  Abdominal pain for 6 weeks, worse in the past 2 weeks. Ileus on radiograph. EXAM: CT ABDOMEN AND PELVIS WITH CONTRAST TECHNIQUE: Multidetector CT imaging of the abdomen and pelvis was performed using the standard protocol following bolus administration of intravenous contrast. CONTRAST:  55m ISOVUE-300 IOPAMIDOL (ISOVUE-300) INJECTION 61% COMPARISON:  Abdominal radiograph earlier this day FINDINGS: Lower chest: No consolidation or pleural fluid. Minimal dependent atelectasis. Coronary artery calcifications. Atherosclerosis of the descending thoracic aorta. The heart is normal in size. Hepatobiliary: No focal liver abnormality is seen. No gallstones, gallbladder wall thickening, or biliary dilatation. Pancreas: No ductal dilatation or inflammation. Spleen: Normal in size without focal abnormality. Adrenals/Urinary Tract: No  adrenal nodule. No hydronephrosis or perinephric edema. Symmetric renal enhancement and excretion on delayed phase imaging. There are bilateral peripelvic and renal cortical cysts. Urinary bladder is physiologically distended, no bladder wall thickening. Stomach/Bowel: Tiny hiatal hernia. Stomach distended with air in fluid, no gastric wall thickening. Few scattered air-fluid levels noted throughout small bowel without abnormal distention or inflammation. No evidence of obstruction. Normal appendix. Moderate diffuse stool burden throughout the colon, no colonic wall thickening. Colonic diverticulosis from the splenic flexure distally, prominent throughout the sigmoid. No acute diverticulitis. Vascular/Lymphatic: He aorta and branch atherosclerosis. Ectatic infrarenal aorta, reaching a new may facial dimensions, maximally 3 cm. No abdominal or pelvic adenopathy. Reproductive: Enlarged prostate gland spanning 5.7 x 4.8 cm causing mass effect on the  bladder base. Other: No free air, free fluid, or intra-abdominal fluid collection. Musculoskeletal: Bilateral L5 pars interarticularis defects with grade 1 anterolisthesis of L5 on S1 and associated degenerative change. Additional degenerative disc disease and facet arthropathy throughout the lumbar spine. There are no acute or suspicious osseous abnormalities. IMPRESSION: 1. Air-fluid levels noted within the stomach and small bowel, generalized ileus pattern. No evidence of wall thickening or bowel inflammation. 2. Moderate colonic stool burden, suggesting constipation. Distal colonic diverticulosis, prominent in the sigmoid, no acute diverticulitis. 3. Aortic atherosclerosis with minimal aneurysmal dilatation infrarenal portion, maximal dimension 3 cm. Recommend followup by ultrasound in 3 years. This recommendation follows ACR consensus guidelines: White Paper of the ACR Incidental Findings Committee II on Vascular Findings. J Am Coll Radiol 2013; 94:765-465 4. Enlarged  prostate gland. Electronically Signed   By: Jeb Levering M.D.   On: 11/14/2016 20:56   US Abdomen Limited  Result Date: 11/14/2016 CLINICAL DATA:  83 y/o  M; epigastric and right upper quadrant pain. EXAM: US ABDOMEN LIMITED - RIGHT UPPER QUADRANT COMPARISON:  None. FINDINGS: Gallbladder: No gallstones or wall thickening visualized. No sonographic Murphy sign noted by sonographer. Common bile duct: Diameter: 4 mm Liver: No focal lesion identified. Within normal limits in parenchymal echogenicity. Normal direction of flow in the main portal vein. IMPRESSION: Normal right upper quadrant ultrasound. Electronically Signed   By: Kristine Garbe M.D.   On: 11/14/2016 23:15   Dg Abd 2 Views  Result Date: 11/14/2016 CLINICAL DATA:  Upper abdominal discomfort for 2 weeks EXAM: ABDOMEN - 2 VIEW COMPARISON:  None. FINDINGS: There is moderate gas distention of both small and large bowel. Gas is present throughout the length of the colon to the rectum. There is no obvious free intraperitoneal gas or pneumatosis. IMPRESSION: Moderate ileus pattern. Electronically Signed   By: Marybelle Killings M.D.   On: 11/14/2016 14:08    Procedures Procedures (including critical care time)  Medications Ordered in ED Medications  sodium chloride 0.9 % bolus 500 mL (0 mLs Intravenous Stopped 11/14/16 2055)  iopamidol (ISOVUE-300) 61 % injection (80 mLs  Contrast Given 11/14/16 2024)     Initial Impression / Assessment and Plan / ED Course  I have reviewed the triage vital signs and the nursing notes.  Pertinent labs & imaging results that were available during my care of the patient were reviewed by me and considered in my medical decision making (see chart for details).     Pt presenting with abdominal pain, findings of ileus verus obstruction on xray at urgent care.  Abdominal CT scan shows ileus but no findings of obstruction.  abdomial ultrasound obtained to better evaluate the biliary tree-this was normal as  well. Pt advised to drink liquids, bland diet.  Given information for PMD followup.  Discharged with strict return precautions.  Pt agreeable with plan.  Final Clinical Impressions(s) / ED Diagnoses   Final diagnoses:  Ileus ([place])  Pain of upper abdomen    New Prescriptions Discharge Medication List as of 11/15/2016 12:15 AM       Alfonzo Beers, MD 11/15/16 [phone]

## 2016-11-14 NOTE — ED Triage Notes (Signed)
Sent from urgent care for follow up of possible ileus-- RUQ discomfort, no problems with diarrhea, constipation, no nausea/vomiting

## 2016-11-14 NOTE — ED Notes (Signed)
Patient transported to Ultrasound 

## 2016-11-14 NOTE — ED Notes (Signed)
Patient given water to sip on at this time.

## 2016-11-14 NOTE — ED Notes (Signed)
Pt   Advised  To  Remain npo  At this  Time

## 2016-11-14 NOTE — ED Triage Notes (Signed)
Pt has been suffering from some RUQ abdominal discomfort for over one week.  He states the pain has been intermittent depending on when he eats.  Pt reports a sharp pain in the area when he bends over to tie his shoes that radiates to his back and up to his neck.

## 2016-11-14 NOTE — Discharge Instructions (Signed)
To go to the emergency department now. Your abdominal pain has been increasing in intensity and frequency in the last couple days. You have bowel distention and what is called an ileus of your bowels and needs to be evaluated in the emergency department.

## 2016-11-14 NOTE — ED Provider Notes (Signed)
CSN: [id number]     Arrival date & time 11/14/16  1040 History   First MD Initiated Contact with Patient 11/14/16 1329     Chief Complaint  Patient presents with  . Abdominal Pain   (Consider location/radiation/quality/duration/timing/severity/associated sxs/prior Treatment) 83 year old male considers himself in generally good health is been complaining of upper abdominal pain for 5-6 weeks. In the past 3-4 days the pain is getting worse. It is located primarily in the right upper quadrant and epigastrium. He states he gets indigestion when eating spicy or greasy food. Sometimes the mid abdomen feels better after eating for a brief time. He has also noticed that when he bends over to tie his shoe he experiences pain in the right upper quadrant. When he takes a deep breath he will experience pain in the mid and right upper quadrant of the abdomen. He denies nausea, vomiting or bleeding. He states he did have some constipation because he was taking pain pills after the procedure a few weeks ago. He has also been taking Pepto-Bismol but he has since stopped since he was not doing any good. No fever or chills. Occasionally he will describe reflux or indigestion.      Past Medical History:  Diagnosis Date  . Arthritis   . Chest pain, unspecified   . Coronary atherosclerosis of unspecified type of vessel, native or graft   . Dizziness and giddiness   . Hypertension   . Myocardial infarction ([place])   . Shortness of breath    exertion   Past Surgical History:  Procedure Laterality Date  . CORONARY ANGIOPLASTY     4 stents  . INGUINAL HERNIA REPAIR  06/14/2012   Procedure: HERNIA REPAIR INGUINAL ADULT;  Surgeon: Odis Hollingshead, MD;  Location: [facility];  Service: General;  Laterality: Right;  . INSERTION OF MESH  06/14/2012   Procedure: INSERTION OF MESH;  Surgeon: Odis Hollingshead, MD;  Location: [place];  Service: General;  Laterality: Right;  . SINUS SURGERY WITH INSTATRAK  1985   Family  History  Problem Relation Age of Onset  . Heart attack Father 72   Social History  Substance Use Topics  . Smoking status: Former Smoker    Quit date: 07/10/1981  . Smokeless tobacco: Never Used  . Alcohol use No    Review of Systems  Constitutional: Negative for activity change, fatigue and fever.  HENT: Negative.   Respiratory: Negative.  Negative for cough, shortness of breath and stridor.   Cardiovascular: Negative for chest pain, palpitations and leg swelling.  Gastrointestinal: Positive for abdominal pain. Negative for abdominal distention, anal bleeding, blood in stool, diarrhea, nausea, rectal pain and vomiting.  Genitourinary: Negative.   Musculoskeletal: Negative.   Skin: Negative.   Neurological: Negative.   All other systems reviewed and are negative.   Allergies  Patient has no known allergies.  Home Medications   Prior to Admission medications   Medication Sig Start Date End Date Taking? Authorizing Provider  aspirin 81 MG tablet Take 81 mg by mouth daily.   Yes [provider]  clopidogrel (PLAVIX) 75 MG tablet TAKE ONE TABLET EACH DAY 11/03/16  Yes Burnell Blanks, MD  lisinopril (PRINIVIL,ZESTRIL) 20 MG tablet TAKE ONE TABLET EACH DAY 11/03/16  Yes Burnell Blanks, MD  simvastatin (ZOCOR) 40 MG tablet Take 1 tablet (40 mg total) by mouth daily. 11/09/15  Yes Burnell Blanks, MD  tamsulosin (FLOMAX) 0.4 MG CAPS capsule Take 0.4 mg by mouth daily after  supper.   Yes [provider]  nitroGLYCERIN (NITROSTAT) 0.4 MG SL tablet Place 0.4 mg under the tongue every 5 (five) minutes as needed for chest pain.    [provider]   Meds Ordered and Administered this Visit  Medications - No data to display  BP 102/75 (BP Location: Right Arm)   Pulse 71   Temp 97.6 F (36.4 C) (Oral)   SpO2 98%  No data found.   Physical Exam  Constitutional: He is oriented to person, place, and time. He appears well-developed and  well-nourished. No distress.  Eyes: EOM are normal.  Neck: Normal range of motion. Neck supple.  Cardiovascular: Normal rate.   Frequent premature beats. Normal rate.  Pulmonary/Chest: Effort normal and breath sounds normal. No respiratory distress. He has no wheezes. He has no rales.  Abdominal: Soft. Bowel sounds are normal. There is no rebound and no guarding.  Abdomen is tympanic in the epigastrium, right upper quadrant and right hemiabdomen. Less so on the left hemiabdomen. Tenderness to the epigastrium and right upper quadrant. Apparently has some tenderness to the inferior liver upon taking a deep breath with palpation. Also noted is what appears to be diastasis recti that he claims to have had for several months. It is not painful or tender.  Musculoskeletal: Normal range of motion. He exhibits deformity. He exhibits no edema.  Lymphadenopathy:    He has no cervical adenopathy.  Neurological: He is alert and oriented to person, place, and time. No cranial nerve deficit. Coordination normal.  Skin: Skin is warm and dry. No rash noted.  Psychiatric: He has a normal mood and affect. His behavior is normal. Thought content normal.  Nursing note and vitals reviewed.   Urgent Care Course     Procedures (including critical care time)  Labs Review Labs Reviewed - No data to display  Imaging Review Dg Abd 2 Views  Result Date: 11/14/2016 CLINICAL DATA:  Upper abdominal discomfort for 2 weeks EXAM: ABDOMEN - 2 VIEW COMPARISON:  None. FINDINGS: There is moderate gas distention of both small and large bowel. Gas is present throughout the length of the colon to the rectum. There is no obvious free intraperitoneal gas or pneumatosis. IMPRESSION: Moderate ileus pattern. Electronically Signed   By: Marybelle Killings M.D.   On: 11/14/2016 14:08     Visual Acuity Review  Right Eye Distance:   Left Eye Distance:   Bilateral Distance:    Right Eye Near:   Left Eye Near:    Bilateral Near:          MDM   1. Generalized abdominal pain   2. Ileus [facility])    To go to the emergency department now. Your abdominal pain has been increasing in intensity and frequency in the last couple days. You have bowel distention and what is called an ileus of your bowels and needs to be evaluated in the emergency department.     Janne Napoleon, NP 11/14/16 1445

## 2016-11-14 NOTE — ED Notes (Signed)
Advised pt of wait time.

## 2016-11-15 NOTE — ED Notes (Signed)
Pt stable, understands discharge instructions, and reasons for return.   

## 2016-11-15 NOTE — ED Notes (Signed)
Patient was able to hold fluids down. States that he is still feeling a burning sensation but is wishing to eat something at this time.

## 2016-11-15 NOTE — Discharge Instructions (Signed)
Return to the ED with any concerns including vomiting and not able to keep down liquids or your medications, abdominal pain especially if it localizes to the right lower abdomen, fever or chills, and decreased urine output, decreased level of alertness or lethargy, or any other alarming symptoms.  °

## 2016-11-21 ENCOUNTER — Ambulatory Visit (INDEPENDENT_AMBULATORY_CARE_PROVIDER_SITE_OTHER): Payer: Medicare Other | Admitting: Physician Assistant

## 2016-11-21 ENCOUNTER — Encounter: Payer: Self-pay | Admitting: Physician Assistant

## 2016-11-21 VITALS — BP 148/66 | HR 78 | Ht 71.0 in | Wt 170.0 lb

## 2016-11-21 DIAGNOSIS — K567 Ileus, unspecified: Secondary | ICD-10-CM

## 2016-11-21 DIAGNOSIS — R141 Gas pain: Secondary | ICD-10-CM

## 2016-11-21 DIAGNOSIS — R1013 Epigastric pain: Secondary | ICD-10-CM

## 2016-11-21 MED ORDER — RANITIDINE HCL 150 MG PO CAPS
150.0000 mg | ORAL_CAPSULE | Freq: Two times a day (BID) | ORAL | 0 refills | Status: DC
Start: 2016-11-21 — End: 2017-08-20

## 2016-11-21 NOTE — Patient Instructions (Addendum)
We have sent the following medications to your pharmacy for you to pick up at your convenience: Zantac 150 mg twice a day   We have given you a GERD handout

## 2016-11-21 NOTE — Progress Notes (Signed)
Chief Complaint: Abdominal pain, Gas, Ileus  HPI:  [name] is an 83 year old Caucasian male with a past medical history as listed below including CAD status post stent 4-5 years ago maintained on Plavix, who presents to clinic today as a new patient after recently being seen in the ED and diagnosed with ileus.   Per chart review patient was initially seen in urgent care in 11/14/16 at that time he described 3-4 days of worsening abdominal pain located in the right upper quadrant and epigastrium. He described indigestion with spicy/greasy food and noticed pain when he bent over to tie his shoes. On exam that day abdomen was found to be tympanic with tenderness in epigastrium and right upper quadrant. Patient had abdominal x-ray which showed moderate ileus pattern. He was told to proceed to the ED for further evaluation.   Patient presented to the ED in 11/14/16 with a complaint of abdominal pain and finding of ileus. Abdominal x-ray as above. Labs at that time showed a lipase of 67, creatinine of 1.3 and a white count minimally elevated at 12.1. Patient had right upper quadrant ultrasound which was normal. He also had CT of the abdomen and pelvis with contrast which showed air-fluid levels noted within the stomach and small bowel with a generalized ileus pattern and no evidence of wall thickening or bowel inflammation. Moderate colonic stool burden suggesting constipation, distal colonic diverticulosis with no acute diverticulitis. Aortic atherosclerosis with minimal aneurysmal dilation infrarenal portion, maximal dimension 3 cm. Patient was sent home on a bland diet with increased fluid intake.   Today, the patient tells me that he has started to feel better over the past 4-5 days or so. He describes developing a generalized mild abdominal pain in his "intestines" 4-5 weeks before presenting to the ER, but then that week it seemed to worsen and increase which prompted his going to the urgent care with a  workup as above. Patient tells me that since being home he has decreased spicy foods and other "irritants" in his diet as his symptoms seem to "act up based on what I eat". Patient tells me that within 1-2 hours after eating he will develop epigastric/right upper quadrant pain which tends to go away after "things move through". Over the past few days the patient has felt even better noting that he did take some milk of magnesia over the weekend and had multiple loose stools which seemed to also remove a lot of gas. Patient is currently having at least 3 bowel movements a day and passing gas. Overall he feels much better. Associated symptoms do include some reflux occasionally and a weight loss of what the patient believes is 15 pounds, though he is unsure how long it has taken him to get down this low.   The patient does describe multiple concerns from his family members including possible parasites and diverticulitis among many others. They are not with him today.   Patient's medical history is positive for a tooth extraction 1-2 weeks prior to onset of ileus symptoms   Patient denies fever, chills, blood in his stool, melena, fatigue, anorexia, nausea or vomiting.  Past Medical History:  Diagnosis Date  . Arthritis   . Chest pain, unspecified   . Coronary atherosclerosis of unspecified type of vessel, native or graft   . Dizziness and giddiness   . Hypertension   . Myocardial infarction ([place])   . Shortness of breath    exertion    Past Surgical History:  Procedure Laterality Date  . CORONARY ANGIOPLASTY     4 stents  . INGUINAL HERNIA REPAIR  06/14/2012   Procedure: HERNIA REPAIR INGUINAL ADULT;  Surgeon: Odis Hollingshead, MD;  Location: [place];  Service: General;  Laterality: Right;  . INSERTION OF MESH  06/14/2012   Procedure: INSERTION OF MESH;  Surgeon: Odis Hollingshead, MD;  Location: [place];  Service: General;  Laterality: Right;  . SINUS SURGERY WITH INSTATRAK  1985    Current  Outpatient Prescriptions  Medication Sig Dispense Refill  . aspirin 81 MG tablet Take 81 mg by mouth daily.    . clopidogrel (PLAVIX) 75 MG tablet TAKE ONE TABLET EACH DAY 30 tablet 7  . lisinopril (PRINIVIL,ZESTRIL) 20 MG tablet TAKE ONE TABLET EACH DAY 30 tablet 7  . nitroGLYCERIN (NITROSTAT) 0.4 MG SL tablet Place 0.4 mg under the tongue every 5 (five) minutes as needed for chest pain.    . simvastatin (ZOCOR) 40 MG tablet Take 1 tablet (40 mg total) by mouth daily. 30 tablet 11  . tamsulosin (FLOMAX) 0.4 MG CAPS capsule Take 0.4 mg by mouth daily after supper.     No current facility-administered medications for this visit.     Allergies as of 11/21/2016  . (No Known Allergies)    Family History  Problem Relation Age of Onset  . Heart attack Father 47    Social History   Social History  . Marital status: Divorced    Spouse name: N/A  . Number of children: 3  . Years of education: N/A   Occupational History  . [employer]   Social History Main Topics  . Smoking status: Former Smoker    Quit date: 07/10/1981  . Smokeless tobacco: Never Used  . Alcohol use No  . Drug use: No  . Sexual activity: Not on file   Other Topics Concern  . Not on file   Social History Narrative   Regular exercise          Review of Systems:    Constitutional: No weight loss, fever or chills Skin: No rash  Cardiovascular: No chest pain Respiratory: No SOB Gastrointestinal: See HPI and otherwise negative Genitourinary: No dysuria Neurological: No headache, dizziness or syncope Musculoskeletal: No new muscle or joint pain Hematologic: No bleeding Psychiatric: No history of depression or anxiety   Physical Exam:  Vital signs: BP (!) 148/66 (BP Location: Right Arm, Patient Position: Sitting, Cuff Size: Normal)   Pulse 78   Ht '5\' 11"'$  (1.803 m)   Wt 170 lb (77.1 kg)   BMI 23.71 kg/m   Constitutional:   Pleasant elderly Caucasian male appears to  be in NAD, Well developed, Well nourished, alert and cooperative Head:  Normocephalic and atraumatic. Eyes:   PEERL, EOMI. No icterus. Conjunctiva pink. Ears:  Normal auditory acuity. Neck:  Supple Throat: Oral cavity and pharynx without inflammation, swelling or lesion.  Respiratory: Respirations even and unlabored. Lungs clear to auscultation bilaterally.   No wheezes, crackles, or rhonchi.  Cardiovascular: Normal S1, S2. No MRG. Regular rate and rhythm. No peripheral edema, cyanosis or pallor.  Gastrointestinal:  Soft, nondistended, mild right sided epigastric ttp. No rebound or guarding. Increased BS all four quadrants No appreciable masses or hepatomegaly. Prominent Diastasis Recti Rectal:  Not performed.  Msk:  Symmetrical without gross deformities. Without edema, no deformity or joint abnormality.  Neurologic:  Alert and  oriented x4;  grossly normal neurologically.  Skin:  Dry and intact without significant lesions or rashes. Psychiatric: Demonstrates good judgement and reason without abnormal affect or behaviors.  RELEVANT LABS AND IMAGING: CBC    Component Value Date/Time   WBC 12.1 (H) 11/14/2016 1533   RBC 5.42 11/14/2016 1533   HGB 15.6 11/14/2016 1533   HCT 47.2 11/14/2016 1533   PLT 199 11/14/2016 1533   MCV 87.1 11/14/2016 1533   MCH 28.8 11/14/2016 1533   MCHC 33.1 11/14/2016 1533   RDW 13.8 11/14/2016 1533   LYMPHSABS 2.3 06/04/2012 1535   MONOABS 0.8 06/04/2012 1535   EOSABS 0.4 06/04/2012 1535   BASOSABS 0.0 06/04/2012 1535    CMP     Component Value Date/Time   NA 137 11/14/2016 1533   K 4.3 11/14/2016 1533   CL 103 11/14/2016 1533   CO2 25 11/14/2016 1533   GLUCOSE 130 (H) 11/14/2016 1533   BUN 24 (H) 11/14/2016 1533   CREATININE 1.30 (H) 11/14/2016 1533   CREATININE 1.36 (H) 06/14/2016 1133   CALCIUM 9.3 11/14/2016 1533   PROT 8.0 11/14/2016 1533   ALBUMIN 4.1 11/14/2016 1533   AST 17 11/14/2016 1533   ALT 12 (L) 11/14/2016 1533   ALKPHOS 67  11/14/2016 1533   BILITOT 1.8 (H) 11/14/2016 1533   GFRNONAA 49 (L) 11/14/2016 1533   GFRAA 56 (L) 11/14/2016 1533   Ct Abdomen Pelvis W Contrast  Result Date: 11/14/2016 CLINICAL DATA:  Abdominal pain for 6 weeks, worse in the past 2 weeks. Ileus on radiograph. EXAM: CT ABDOMEN AND PELVIS WITH CONTRAST TECHNIQUE: Multidetector CT imaging of the abdomen and pelvis was performed using the standard protocol following bolus administration of intravenous contrast. CONTRAST:  36m ISOVUE-300 IOPAMIDOL (ISOVUE-300) INJECTION 61% COMPARISON:  Abdominal radiograph earlier this day FINDINGS: Lower chest: No consolidation or pleural fluid. Minimal dependent atelectasis. Coronary artery calcifications. Atherosclerosis of the descending thoracic aorta. The heart is normal in size. Hepatobiliary: No focal liver abnormality is seen. No gallstones, gallbladder wall thickening, or biliary dilatation. Pancreas: No ductal dilatation or inflammation. Spleen: Normal in size without focal abnormality. Adrenals/Urinary Tract: No adrenal nodule. No hydronephrosis or perinephric edema. Symmetric renal enhancement and excretion on delayed phase imaging. There are bilateral peripelvic and renal cortical cysts. Urinary bladder is physiologically distended, no bladder wall thickening. Stomach/Bowel: Tiny hiatal hernia. Stomach distended with air in fluid, no gastric wall thickening. Few scattered air-fluid levels noted throughout small bowel without abnormal distention or inflammation. No evidence of obstruction. Normal appendix. Moderate diffuse stool burden throughout the colon, no colonic wall thickening. Colonic diverticulosis from the splenic flexure distally, prominent throughout the sigmoid. No acute diverticulitis. Vascular/Lymphatic: He aorta and branch atherosclerosis. Ectatic infrarenal aorta, reaching a new may facial dimensions, maximally 3 cm. No abdominal or pelvic adenopathy. Reproductive: Enlarged prostate gland  spanning 5.7 x 4.8 cm causing mass effect on the bladder base. Other: No free air, free fluid, or intra-abdominal fluid collection. Musculoskeletal: Bilateral L5 pars interarticularis defects with grade 1 anterolisthesis of L5 on S1 and associated degenerative change. Additional degenerative disc disease and facet arthropathy throughout the lumbar spine. There are no acute or suspicious osseous abnormalities. IMPRESSION: 1. Air-fluid levels noted within the stomach and small bowel, generalized ileus pattern. No evidence of wall thickening or bowel inflammation. 2. Moderate colonic stool burden, suggesting constipation. Distal colonic diverticulosis, prominent in the sigmoid, no acute diverticulitis. 3. Aortic atherosclerosis with minimal aneurysmal dilatation infrarenal portion, maximal dimension 3 cm. Recommend followup by ultrasound in 3 years. This recommendation follows  ACR consensus guidelines: White Paper of the ACR Incidental Findings Committee II on Vascular Findings. J Am Coll Radiol 2013; 11:735-670 4. Enlarged prostate gland. Electronically Signed   By: Jeb Levering M.D.   On: 11/14/2016 20:56   US Abdomen Limited  Result Date: 11/14/2016 CLINICAL DATA:  83 y/o  M; epigastric and right upper quadrant pain. EXAM: US ABDOMEN LIMITED - RIGHT UPPER QUADRANT COMPARISON:  None. FINDINGS: Gallbladder: No gallstones or wall thickening visualized. No sonographic Murphy sign noted by sonographer. Common bile duct: Diameter: 4 mm Liver: No focal lesion identified. Within normal limits in parenchymal echogenicity. Normal direction of flow in the main portal vein. IMPRESSION: Normal right upper quadrant ultrasound. Electronically Signed   By: Kristine Garbe M.D.   On: 11/14/2016 23:15   Dg Abd 2 Views  Result Date: 11/14/2016 CLINICAL DATA:  Upper abdominal discomfort for 2 weeks EXAM: ABDOMEN - 2 VIEW COMPARISON:  None. FINDINGS: There is moderate gas distention of both small and large bowel.  Gas is present throughout the length of the colon to the rectum. There is no obvious free intraperitoneal gas or pneumatosis. IMPRESSION: Moderate ileus pattern. Electronically Signed   By: Marybelle Killings M.D.   On: 11/14/2016 14:08    Assessment: 1. Ileus: As noted in x-rays above, question relation to recent dental procedure, seems to now be resolving with more frequent bowel movements throughout the day and passage of gas with decreasing pain 2. Epigastric pain: Patient does describe epigastric pain 1-2 hours after eating "irritating" foods such as spicy and greasy things, also describes two distinct episodes of nighttime awakenings of reflux over the past 6 months; question whether recent ileus contributed to these symptoms 3. Gas: As above  Plan: 1. Recommend patient start Zantac 150 mg twice a day 14 days, provided him with a prescription 2. Provided patient with a reflux/gastritis handout and recommendations for antireflux diet and lifestyle modifications 3. At this time discussed with the patient that we will not pursue any invasive procedures including colonoscopy or endoscopy as he has recently had thorough imaging and at his age these procedures would put him at increased risk, we will only pursue these if necessary 4. Reassured the patient that his symptoms are not consistent with diverticulitis or a parasite infection as these were concerns from his family members 5. Patient to follow in clinic in 4-6 weeks, his son apparently requested before time of patient's appointment that this be with Dr. Silverio Decamp 6. If patient calls with continued pain before time of appointment will need repeat KUB  Ellouise Newer, PA-C [facility] 11/21/2016, 1:49 PM  Cc: No ref. provider found

## 2016-11-27 ENCOUNTER — Ambulatory Visit: Payer: Medicare Other | Admitting: Nurse Practitioner

## 2016-12-07 NOTE — Progress Notes (Signed)
Reviewed and agree with documentation and assessment and plan. K. Veena Tanisa Lagace , MD   

## 2017-01-08 ENCOUNTER — Ambulatory Visit (HOSPITAL_COMMUNITY)
Admission: EM | Admit: 2017-01-08 | Discharge: 2017-01-08 | Disposition: A | Discharge status: Home / Self Care | Payer: Medicare Other | Attending: Emergency Medicine | Admitting: Emergency Medicine

## 2017-01-08 ENCOUNTER — Telehealth: Payer: Self-pay | Admitting: Cardiovascular Disease

## 2017-01-08 ENCOUNTER — Encounter (HOSPITAL_COMMUNITY): Payer: Self-pay | Admitting: Emergency Medicine

## 2017-01-08 ENCOUNTER — Ambulatory Visit (INDEPENDENT_AMBULATORY_CARE_PROVIDER_SITE_OTHER): Payer: Medicare Other

## 2017-01-08 DIAGNOSIS — M25552 Pain in left hip: Secondary | ICD-10-CM | POA: Diagnosis not present

## 2017-01-08 DIAGNOSIS — S7002XA Contusion of left hip, initial encounter: Secondary | ICD-10-CM | POA: Diagnosis not present

## 2017-01-08 DIAGNOSIS — W19XXXA Unspecified fall, initial encounter: Secondary | ICD-10-CM

## 2017-01-08 DIAGNOSIS — T148XXA Other injury of unspecified body region, initial encounter: Secondary | ICD-10-CM

## 2017-01-08 DIAGNOSIS — S40812A Abrasion of left upper arm, initial encounter: Secondary | ICD-10-CM | POA: Diagnosis not present

## 2017-01-08 DIAGNOSIS — Z23 Encounter for immunization: Secondary | ICD-10-CM | POA: Diagnosis not present

## 2017-01-08 DIAGNOSIS — S79912A Unspecified injury of left hip, initial encounter: Secondary | ICD-10-CM | POA: Diagnosis not present

## 2017-01-08 MED ORDER — TETANUS-DIPHTH-ACELL PERTUSSIS 5-2.5-18.5 LF-MCG/0.5 IM SUSP
INTRAMUSCULAR | Status: AC
  Filled 2017-01-08: qty 0.5

## 2017-01-08 MED ORDER — TETANUS-DIPHTH-ACELL PERTUSSIS 5-2.5-18.5 LF-MCG/0.5 IM SUSP
0.5000 mL | Freq: Once | INTRAMUSCULAR | Status: AC
  Administered 2017-01-08: 0.5 mL via INTRAMUSCULAR

## 2017-01-08 NOTE — Telephone Encounter (Signed)
New Message:   Pt just aclled and said it was important that he talked to Dr Angelena Form. He said he wanted a call from his asap,he wanted to tell him what was going on with him. Pt was asked if he need an appointment,he said he wanted to talk to Dr Angelena Form on the phone first and that it was urgent.

## 2017-01-08 NOTE — ED Triage Notes (Signed)
The patient presented to the [facility] with a complaint of left hip pain secondary to a fall that occurred 4 days ago.

## 2017-01-08 NOTE — Telephone Encounter (Signed)
I spoke with pt. He reports he has been having numbness in left leg from the knee down. It has happened several times in the last week. Today it caused him to fall in the driveway.  Landed on his hip.  No other numbness at current time.  States he sometimes will have numbness in arms and shoulder when he wakes up in the morning but this goes away.  He does not have a primary care doctor.  I instructed pt he should go to Urgent Care today for evaluation of numbness and due to fall.  He has been to Urgent Care at [facility] so will go there.

## 2017-01-08 NOTE — ED Provider Notes (Signed)
CSN: [id number]     Arrival date & time 01/08/17  1523 History   None    Chief Complaint  Patient presents with  . Fall   (Consider location/radiation/quality/duration/timing/severity/associated sxs/prior Treatment) The history is provided by the patient and a relative. No language interpreter was used.  Fall  This is a recurrent problem. Episode onset: today. Episode frequency: has fallen several tiems over last few weeks, left leg gives out per pt report. The problem has not changed since onset.Pertinent negatives include no chest pain, no abdominal pain, no headaches and no shortness of breath. The symptoms are aggravated by walking and standing. The symptoms are relieved by rest. He has tried nothing for the symptoms.    Past Medical History:  Diagnosis Date  . Arthritis   . Chest pain, unspecified   . Coronary atherosclerosis of unspecified type of vessel, native or graft   . Dizziness and giddiness   . Hypertension   . Myocardial infarction ([place])   . Shortness of breath    exertion   Past Surgical History:  Procedure Laterality Date  . CORONARY ANGIOPLASTY     4 stents  . INGUINAL HERNIA REPAIR  06/14/2012   Procedure: HERNIA REPAIR INGUINAL ADULT;  Surgeon: Odis Hollingshead, MD;  Location: [facility];  Service: General;  Laterality: Right;  . INSERTION OF MESH  06/14/2012   Procedure: INSERTION OF MESH;  Surgeon: Odis Hollingshead, MD;  Location: [facility];  Service: General;  Laterality: Right;  . SINUS SURGERY WITH INSTATRAK  1985   Family History  Problem Relation Age of Onset  . Heart attack Father 4   Social History  Substance Use Topics  . Smoking status: Former Smoker    Quit date: 07/10/1981  . Smokeless tobacco: Never Used  . Alcohol use No    Review of Systems  Respiratory: Negative for shortness of breath.   Cardiovascular: Negative for chest pain.  Gastrointestinal: Negative for abdominal pain.  Musculoskeletal: Positive for arthralgias and gait problem.    Skin: Positive for color change and wound.  Neurological: Negative for headaches.  All other systems reviewed and are negative.   Allergies  Patient has no known allergies.  Home Medications   Prior to Admission medications   Medication Sig Start Date End Date Taking? Authorizing Provider  aspirin 81 MG tablet Take 81 mg by mouth daily.    [provider]  clopidogrel (PLAVIX) 75 MG tablet TAKE ONE TABLET EACH DAY 11/03/16   Burnell Blanks, MD  lisinopril (PRINIVIL,ZESTRIL) 20 MG tablet TAKE ONE TABLET EACH DAY 11/03/16   Burnell Blanks, MD  nitroGLYCERIN (NITROSTAT) 0.4 MG SL tablet Place 0.4 mg under the tongue every 5 (five) minutes as needed for chest pain.    [provider]  ranitidine (ZANTAC) 150 MG capsule Take 1 capsule (150 mg total) by mouth 2 (two) times daily. 11/21/16   Levin Erp, PA  simvastatin (ZOCOR) 40 MG tablet Take 1 tablet (40 mg total) by mouth daily. 11/09/15   Burnell Blanks, MD  tamsulosin (FLOMAX) 0.4 MG CAPS capsule Take 0.4 mg by mouth daily after supper.    [provider]   Meds Ordered and Administered this Visit   Medications  Tdap (BOOSTRIX) injection 0.5 mL (0.5 mLs Intramuscular Given 01/08/17 1705)    BP 120/64 (BP Location: Right Arm)   Pulse 93   Temp 98.2 F (36.8 C) (Oral)   Resp 18   SpO2 97%  No  data found.   Physical Exam  Constitutional: He is oriented to person, place, and time. He appears well-developed and well-nourished. He is active and cooperative.  HENT:  Head: Normocephalic and atraumatic.  Eyes: Conjunctivae are normal.  Neck: Neck supple.  Cardiovascular: Normal rate and regular rhythm.   No murmur heard. Pulmonary/Chest: Effort normal and breath sounds normal. No respiratory distress.  Abdominal: Soft. There is no tenderness.  Musculoskeletal: He exhibits no edema.       Left hip: He exhibits tenderness, bony tenderness and swelling. He exhibits normal  range of motion, normal strength, no crepitus, no deformity and no laceration.  Neurological: He is alert and oriented to person, place, and time. GCS eye subscore is 4. GCS verbal subscore is 5. GCS motor subscore is 6.  Skin: Skin is warm and dry. Abrasion and bruising noted.     Psychiatric: He has a normal mood and affect. His speech is normal and behavior is normal.  Nursing note and vitals reviewed.   Urgent Care Course     Procedures (including critical care time)  Labs Review Labs Reviewed - No data to display  Imaging Review Dg Hip Unilat With Pelvis 2-3 Views Left  Result Date: 01/08/2017 CLINICAL DATA:  False morning with left hip pain, initial encounter EXAM: DG HIP (WITH OR WITHOUT PELVIS) 2-3V LEFT COMPARISON:  None. FINDINGS: Mild degenerative changes of the left hip joint are seen. No acute fracture or dislocation is noted. Some angulation to the cortex at the junction of the femoral head and femoral neck is noted superiorly although stable in appearance from prior CT examination. No soft tissue abnormality is seen. No acute bony abnormality is noted. IMPRESSION: Degenerative change without acute abnormality. Electronically Signed   By: Inez Catalina M.D.   On: 01/08/2017 16:22          MDM   1. Fall, initial encounter   2. Fall   3. Contusion of left hip, initial encounter   4. Abrasion     Your xray was negative for hip fracture today at [facility]. Please follow up with your PCP/Cardiologist regarding visit from today. Recommend Ct scan if pain persists(go to ER). You are on blood thinners and this puts you at higher risk for injury with fall. Keep abrasions clean, cover with abx ointment watch for s/s of infection(redness, fever, purulent drainage). May take OTC tylenol 650 mg po for pain related to fall every 6 hours for 2-3 days. Pt verbalized understanding to this provider.    Tori Milks, NP 38/45/36 1139

## 2017-01-08 NOTE — Telephone Encounter (Signed)
Thanks. Do I need to call him?

## 2017-01-08 NOTE — Telephone Encounter (Signed)
He wanted to know what you thought he should do about the numbness.  I told him I would pass all the information we discussed on to you.  I am sure he would appreciate a call.

## 2017-01-08 NOTE — Discharge Instructions (Signed)
Your xray was negative for hip fracture today at [facility]. Please follow up with your PCP/Cardiologist regarding visit from today. Recommend Ct scan if pain persists(go to ER). You are on blood thinners and this puts you at higher risk for injury with fall. Keep abrasions clean, cover with abx ointment watch for s/s of infection(redness, fever, purulent drainage). May take OTc tylenol 650 mg po for pain related to fall every 6 hours for 2-3 days.

## 2017-01-09 NOTE — Telephone Encounter (Signed)
I attempted to call him today and left a message on his voicemail. I will try him back later.   Lauree Chandler

## 2017-01-12 NOTE — Telephone Encounter (Signed)
Spoke with pt, made aware the appointment on the 10th is with [facility] not with [place]. Also made aware dr Angelena Form had tried to call him. Follow up scheduled with dr Angelena Form on Monday at patient request.

## 2017-01-12 NOTE — Telephone Encounter (Signed)
F/u message  Pt call requesting to speak with RN. Pt states he reecieed a call on 7/5 for an appt on 7/10. Pt states he is confused as to who called him about the appt. I did inform pt there was no appt schedule. Pt asked for a call back.

## 2017-01-12 NOTE — Telephone Encounter (Signed)
Can we see if he can come in to see me Monday the 9th at 10am?

## 2017-01-15 ENCOUNTER — Encounter: Payer: Self-pay | Admitting: Cardiovascular Disease

## 2017-01-15 ENCOUNTER — Ambulatory Visit (INDEPENDENT_AMBULATORY_CARE_PROVIDER_SITE_OTHER): Payer: Medicare Other | Admitting: Cardiovascular Disease

## 2017-01-15 VITALS — BP 164/60 | HR 63 | Ht 71.0 in | Wt 177.4 lb

## 2017-01-15 DIAGNOSIS — I10 Essential (primary) hypertension: Secondary | ICD-10-CM

## 2017-01-15 DIAGNOSIS — R42 Dizziness and giddiness: Secondary | ICD-10-CM | POA: Diagnosis not present

## 2017-01-15 DIAGNOSIS — I2510 Atherosclerotic heart disease of native coronary artery without angina pectoris: Secondary | ICD-10-CM

## 2017-01-15 NOTE — Patient Instructions (Signed)
Medication Instructions:  Your physician recommends that you continue on your current medications as directed. Please refer to the Current Medication list given to you today.   Labwork: NONE ORDERED  Testing/Procedures: 1. Your physician has requested that you have an echocardiogram. Echocardiography is a painless test that uses sound waves to create images of your heart. It provides your doctor with information about the size and shape of your heart and how well your heart's chambers and valves are working. This procedure takes approximately one hour. There are no restrictions for this procedure.  2. Your physician has recommended that you wear a 48 HOUR holter monitor. Holter monitors are medical devices that record the heart's electrical activity. Doctors most often use these monitors to diagnose arrhythmias. Arrhythmias are problems with the speed or rhythm of the heartbeat. The monitor is a small, portable device. You can wear one while you do your normal daily activities. This is usually used to diagnose what is causing palpitations/syncope (passing out).     Follow-Up: DR. Angelena Form IN ABOUT 4-6 WEEKS  Any Other Special Instructions Will Be Listed Below (If Applicable).     If you need a refill on your cardiac medications before your next appointment, please call your pharmacy.

## 2017-01-15 NOTE — Progress Notes (Signed)
Chief Complaint  Patient presents with  . Fall     History of Present Illness: 83 yo male with history of CAD, HTN who is here today for follow up with complaint of weakness in his legs. He called our office last week and was added to my schedule today. He is known to have CAD with placement of drug eluting stents in the proximal and mid Circumflex (2.5 x 16 mm Taxus, 2.5 x 16 mm Taxus) and mid LAD (3.0 x 16 mm Taxus) in March 2007. LVEF=60% by cath in 2007. No cath or assessment of LVEF since then.   He is here today for follow up. The patient denies any chest pain, dyspnea, palpitations, lower extremity edema, orthopnea, PND. He has had dizziness when walking around. No near syncope or syncope. He has been having weakness in his legs and several falls. He reports numbness in his left leg that occurs suddenly and lasts for 30 minutes. He has had one fall. He was seen in the urgent care and no fracture.    Primary Care Physician: Patient, No Pcp Per  Past Medical History:  Diagnosis Date  . Arthritis   . Chest pain, unspecified   . Coronary atherosclerosis of unspecified type of vessel, native or graft   . Dizziness and giddiness   . Hypertension   . Myocardial infarction ([place])   . Shortness of breath    exertion    Past Surgical History:  Procedure Laterality Date  . CORONARY ANGIOPLASTY     4 stents  . INGUINAL HERNIA REPAIR  06/14/2012   Procedure: HERNIA REPAIR INGUINAL ADULT;  Surgeon: Odis Hollingshead, MD;  Location: [facility];  Service: General;  Laterality: Right;  . INSERTION OF MESH  06/14/2012   Procedure: INSERTION OF MESH;  Surgeon: Odis Hollingshead, MD;  Location: [facility];  Service: General;  Laterality: Right;  . SINUS SURGERY WITH INSTATRAK  1985    Current Outpatient Prescriptions  Medication Sig Dispense Refill  . aspirin 81 MG tablet Take 81 mg by mouth daily.    . clopidogrel (PLAVIX) 75 MG tablet TAKE ONE TABLET EACH DAY 30 tablet 7  . lisinopril  (PRINIVIL,ZESTRIL) 20 MG tablet TAKE ONE TABLET EACH DAY 30 tablet 7  . nitroGLYCERIN (NITROSTAT) 0.4 MG SL tablet Place 0.4 mg under the tongue every 5 (five) minutes as needed for chest pain.    . ranitidine (ZANTAC) 150 MG capsule Take 1 capsule (150 mg total) by mouth 2 (two) times daily. 28 capsule 0  . simvastatin (ZOCOR) 40 MG tablet Take 1 tablet (40 mg total) by mouth daily. 30 tablet 11  . tamsulosin (FLOMAX) 0.4 MG CAPS capsule Take 0.4 mg by mouth daily after supper.     No current facility-administered medications for this visit.     No Known Allergies  Social History   Social History  . Marital status: Divorced    Spouse name: N/A  . Number of children: 3  . Years of education: N/A   Occupational History  . [employer]   Social History Main Topics  . Smoking status: Former Smoker    Quit date: 07/10/1981  . Smokeless tobacco: Never Used  . Alcohol use No  . Drug use: No  . Sexual activity: Not on file   Other Topics Concern  . Not on file   Social History Narrative   Regular exercise  Family History  Problem Relation Age of Onset  . Heart attack Father 59    Review of Systems:  As stated in the HPI and otherwise negative.   BP (!) 164/60   Pulse 63   Ht 5\' 11"  (1.803 m)   Wt 177 lb 6.4 oz (80.5 kg)   SpO2 99%   BMI 24.74 kg/m   Physical Examination:  General: Well developed, well nourished, NAD  HEENT: OP clear, mucus membranes moist  SKIN: warm, dry. No rashes. Neuro: No focal deficits  Musculoskeletal: Muscle strength 5/5 all ext  Psychiatric: Mood and affect normal  Neck: No JVD, no carotid bruits, no thyromegaly, no lymphadenopathy.  Lungs:Clear bilaterally, no wheezes, rhonci, crackles Cardiovascular: Regular rate and rhythm. No murmurs, gallops or rubs. Abdomen:Soft. Bowel sounds present. Non-tender.  Extremities: No lower extremity edema. Pulses are 1 + in the bilateral DP/PT.  Cardiac  cath March 2007:  Left main had a 20% stenosis.  LAD was a long vessel which wrapped the apex. It gave off a very large  branching first diagonal and a tiny second diagonal. The mid-LAD at the  takeoff of a small second diagonal and large septal perforator there was  tubular stenosis which appeared to be 70-80%.  The left circumflex was a moderate-sized vessel. It gave off a moderate-  sized ramus, small to moderate OM-1 and a moderate-sized OM-2. There was a  99% stenosis in the mid-left circumflex prior to the takeoff of the OM-1. In  the proximal portion of the OM-2, there was a 90% stenosis. There was a 50%  stenosis in the distal OM-2.  Right coronary was a large dominant vessel. It gave off a large PDA, one  small PL, and a moderate-sized PL. There was a 50% tubular lesion in the mid-  portion. There was a 30% lesion around the distal bend.  Left ventriculogram done in the RAO position showed an EF of 55-60% with a  question of mild anterior hypokinesis. In the lateral projection, the EF was  60% with no evidence of a lateral wall motion abnormality.  EKG:  EKG is  ordered today. The ekg ordered today demonstrates Sinus brady, rate 59 bpm. Chronic ST depression, TWI  Recent Labs: 11/14/2016: ALT 12; BUN 24; Creatinine, Ser 1.30; Hemoglobin 15.6; Platelets 199; Potassium 4.3; Sodium 137   Lipid Panel    Component Value Date/Time   CHOL 172 06/14/2016 1133   TRIG 122 06/14/2016 1133   HDL 42 06/14/2016 1133   CHOLHDL 4.1 06/14/2016 1133   VLDL 24 06/14/2016 1133   LDLCALC 106 (H) 06/14/2016 1133     Wt Readings from Last 3 Encounters:  01/15/17 177 lb 6.4 oz (80.5 kg)  11/21/16 170 lb (77.1 kg)  11/14/16 186 lb (84.4 kg)     Other studies Reviewed: Additional studies/ records that were reviewed today include: . Review of the above records demonstrates:    Assessment and Plan:   1. CAD without angina: He is having no chest pain suggestive of angina. Will continue ASA,  Plavix, Lisinopril and statin. No beta blocker with bradycardia.    2. HTN: BP elevated today. Will follow for now. I do not want to increase his meds given his recent dizziness.   3. Dizziness: He is having dizziness when walking. Will arrange 48 hour cardiac monitor and echo.   4. Leg weakness: His weakness does not sound like PAD. He has no claudication. Pedal pulses are palpable. This seems more neurological. I  have asked him to establish in primary care. He may need neurological f/u.    Current medicines are reviewed at length with the patient today.  The patient does not have concerns regarding medicines.  The following changes have been made:  no change  Labs/ tests ordered today include:   Orders Placed This Encounter  Procedures  . Holter monitor - 48 hour  . EKG 12-Lead  . ECHOCARDIOGRAM COMPLETE    Disposition:   FU with me in 1  months  Signed, Lauree Chandler, MD 01/15/2017 10:41 AM    [facility] [address] [place], [place], [place]  [address] Phone: [phone]; Fax: [phone]

## 2017-01-16 ENCOUNTER — Telehealth: Payer: Self-pay | Admitting: *Deleted

## 2017-01-16 ENCOUNTER — Ambulatory Visit: Payer: Medicare Other | Admitting: Gastroenterology

## 2017-01-16 NOTE — Telephone Encounter (Signed)
I spoke with pt's daughter and scheduled pt for 4-6 week follow up on 02/14/17 at 9:40.  Daughter is also requesting handicap parking form completed.  I told her Dr. Angelena Form could complete this when back in the office tomorrow and I would leave at front desk for her to pick up on 01/18/17

## 2017-01-17 NOTE — Telephone Encounter (Signed)
Completed form left at front desk.

## 2017-01-22 ENCOUNTER — Other Ambulatory Visit (HOSPITAL_COMMUNITY): Payer: Self-pay | Admitting: Internal Medicine

## 2017-01-22 ENCOUNTER — Other Ambulatory Visit: Payer: Self-pay | Admitting: Internal Medicine

## 2017-01-22 DIAGNOSIS — R269 Unspecified abnormalities of gait and mobility: Secondary | ICD-10-CM

## 2017-01-22 DIAGNOSIS — G250 Essential tremor: Secondary | ICD-10-CM | POA: Diagnosis not present

## 2017-01-22 DIAGNOSIS — G6289 Other specified polyneuropathies: Secondary | ICD-10-CM | POA: Diagnosis not present

## 2017-01-22 DIAGNOSIS — I739 Peripheral vascular disease, unspecified: Secondary | ICD-10-CM

## 2017-01-22 DIAGNOSIS — G629 Polyneuropathy, unspecified: Secondary | ICD-10-CM | POA: Diagnosis not present

## 2017-01-22 DIAGNOSIS — E784 Other hyperlipidemia: Secondary | ICD-10-CM | POA: Diagnosis not present

## 2017-01-22 DIAGNOSIS — R42 Dizziness and giddiness: Secondary | ICD-10-CM | POA: Diagnosis not present

## 2017-01-22 DIAGNOSIS — I10 Essential (primary) hypertension: Secondary | ICD-10-CM | POA: Diagnosis not present

## 2017-01-22 DIAGNOSIS — Z1389 Encounter for screening for other disorder: Secondary | ICD-10-CM | POA: Diagnosis not present

## 2017-01-22 DIAGNOSIS — N400 Enlarged prostate without lower urinary tract symptoms: Secondary | ICD-10-CM | POA: Diagnosis not present

## 2017-01-22 DIAGNOSIS — Z9861 Coronary angioplasty status: Secondary | ICD-10-CM | POA: Diagnosis not present

## 2017-01-22 DIAGNOSIS — Z681 Body mass index (BMI) 19 or less, adult: Secondary | ICD-10-CM | POA: Diagnosis not present

## 2017-01-22 DIAGNOSIS — I7389 Other specified peripheral vascular diseases: Secondary | ICD-10-CM | POA: Diagnosis not present

## 2017-01-23 ENCOUNTER — Ambulatory Visit (HOSPITAL_COMMUNITY)
Admission: RE | Admit: 2017-01-23 | Discharge: 2017-01-23 | Disposition: A | Discharge status: Home / Self Care | Payer: Medicare Other | Source: Ambulatory Visit | Attending: Vascular Surgery | Admitting: Vascular Surgery

## 2017-01-23 DIAGNOSIS — I739 Peripheral vascular disease, unspecified: Secondary | ICD-10-CM | POA: Insufficient documentation

## 2017-01-23 DIAGNOSIS — R9439 Abnormal result of other cardiovascular function study: Secondary | ICD-10-CM | POA: Insufficient documentation

## 2017-01-25 ENCOUNTER — Ambulatory Visit (INDEPENDENT_AMBULATORY_CARE_PROVIDER_SITE_OTHER): Payer: Medicare Other

## 2017-01-25 ENCOUNTER — Ambulatory Visit (HOSPITAL_COMMUNITY): Payer: Medicare Other | Attending: Cardiology

## 2017-01-25 ENCOUNTER — Other Ambulatory Visit: Payer: Self-pay

## 2017-01-25 DIAGNOSIS — I2510 Atherosclerotic heart disease of native coronary artery without angina pectoris: Secondary | ICD-10-CM

## 2017-01-25 DIAGNOSIS — I420 Dilated cardiomyopathy: Secondary | ICD-10-CM | POA: Insufficient documentation

## 2017-01-25 DIAGNOSIS — I069 Rheumatic aortic valve disease, unspecified: Secondary | ICD-10-CM | POA: Insufficient documentation

## 2017-01-25 DIAGNOSIS — I5030 Unspecified diastolic (congestive) heart failure: Secondary | ICD-10-CM | POA: Diagnosis not present

## 2017-01-25 DIAGNOSIS — R42 Dizziness and giddiness: Secondary | ICD-10-CM

## 2017-01-26 ENCOUNTER — Ambulatory Visit
Admission: RE | Admit: 2017-01-26 | Discharge: 2017-01-26 | Disposition: A | Discharge status: Home / Self Care | Payer: Medicare Other | Source: Ambulatory Visit | Attending: Internal Medicine | Admitting: Internal Medicine

## 2017-01-26 DIAGNOSIS — R269 Unspecified abnormalities of gait and mobility: Secondary | ICD-10-CM

## 2017-01-29 ENCOUNTER — Other Ambulatory Visit: Payer: Self-pay | Admitting: Internal Medicine

## 2017-01-29 DIAGNOSIS — R413 Other amnesia: Secondary | ICD-10-CM

## 2017-01-29 DIAGNOSIS — R269 Unspecified abnormalities of gait and mobility: Secondary | ICD-10-CM

## 2017-02-14 ENCOUNTER — Ambulatory Visit
Admission: RE | Admit: 2017-02-14 | Discharge: 2017-02-14 | Disposition: A | Discharge status: Home / Self Care | Payer: Medicare Other | Source: Ambulatory Visit | Attending: Internal Medicine | Admitting: Internal Medicine

## 2017-02-14 ENCOUNTER — Encounter: Payer: Self-pay | Admitting: Cardiovascular Disease

## 2017-02-14 ENCOUNTER — Ambulatory Visit (INDEPENDENT_AMBULATORY_CARE_PROVIDER_SITE_OTHER): Payer: Medicare Other | Admitting: Cardiovascular Disease

## 2017-02-14 VITALS — BP 120/52 | HR 57 | Ht 71.0 in | Wt 175.2 lb

## 2017-02-14 DIAGNOSIS — I10 Essential (primary) hypertension: Secondary | ICD-10-CM

## 2017-02-14 DIAGNOSIS — R42 Dizziness and giddiness: Secondary | ICD-10-CM | POA: Diagnosis not present

## 2017-02-14 DIAGNOSIS — R269 Unspecified abnormalities of gait and mobility: Secondary | ICD-10-CM

## 2017-02-14 DIAGNOSIS — R413 Other amnesia: Secondary | ICD-10-CM

## 2017-02-14 DIAGNOSIS — I2510 Atherosclerotic heart disease of native coronary artery without angina pectoris: Secondary | ICD-10-CM | POA: Diagnosis not present

## 2017-02-14 MED ORDER — GADOBENATE DIMEGLUMINE 529 MG/ML IV SOLN
16.0000 mL | Freq: Once | INTRAVENOUS | Status: AC | PRN
  Administered 2017-02-14: 16 mL via INTRAVENOUS

## 2017-02-14 NOTE — Patient Instructions (Signed)

## 2017-02-14 NOTE — Progress Notes (Signed)
Chief Complaint  Patient presents with  . Follow-up     History of Present Illness: 83 yo male with history of CAD, HTN who is here today for follow up. He is known to have CAD with placement of drug eluting stents in the proximal and mid Circumflex (2.5 x 16 mm Taxus, 2.5 x 16 mm Taxus) and mid LAD (3.0 x 16 mm Taxus) in March 2007. LVEF=60% by cath in 2007. No cath or assessment of LVEF since then. I saw him 01/15/17 and he had c/o leg weakness, dizziness. He had one fall due to sudden onset of weakness in his left leg. 48 hour cardiac monitor showed sinus bradycardia with PVCs, PACs and a short run of a wide complex tachycardia. Echo 01/25/17 with normal LV systolic function, no significant valve disease.   He is here today for follow up. The patient denies any chest pain, dyspnea, palpitations, lower extremity edema, orthopnea, PND, near syncope or syncope. He has continued weakness in his left leg. He is having a brain MRI today today per Dr. Brigitte Pulse. Flomax was stopped in primary care.      Primary Care Physician: Marton Redwood, MD  Past Medical History:  Diagnosis Date  . Arthritis   . Chest pain, unspecified   . Coronary atherosclerosis of unspecified type of vessel, native or graft   . Dizziness and giddiness   . Hypertension   . Myocardial infarction ([place])   . Shortness of breath    exertion    Past Surgical History:  Procedure Laterality Date  . CORONARY ANGIOPLASTY     4 stents  . INGUINAL HERNIA REPAIR  06/14/2012   Procedure: HERNIA REPAIR INGUINAL ADULT;  Surgeon: Odis Hollingshead, MD;  Location: [facility];  Service: General;  Laterality: Right;  . INSERTION OF MESH  06/14/2012   Procedure: INSERTION OF MESH;  Surgeon: Odis Hollingshead, MD;  Location: [facility];  Service: General;  Laterality: Right;  . SINUS SURGERY WITH INSTATRAK  1985    Current Outpatient Prescriptions  Medication Sig Dispense Refill  . aspirin 81 MG tablet Take 81 mg by mouth daily.    . clopidogrel  (PLAVIX) 75 MG tablet TAKE ONE TABLET EACH DAY 30 tablet 7  . lisinopril (PRINIVIL,ZESTRIL) 20 MG tablet TAKE ONE TABLET EACH DAY 30 tablet 7  . nitroGLYCERIN (NITROSTAT) 0.4 MG SL tablet Place 0.4 mg under the tongue every 5 (five) minutes as needed for chest pain.    . ranitidine (ZANTAC) 150 MG capsule Take 1 capsule (150 mg total) by mouth 2 (two) times daily. 28 capsule 0  . simvastatin (ZOCOR) 40 MG tablet Take 1 tablet (40 mg total) by mouth daily. 30 tablet 11   No current facility-administered medications for this visit.     No Known Allergies  Social History   Social History  . Marital status: Divorced    Spouse name: N/A  . Number of children: 3  . Years of education: N/A   Occupational History  . [employer]   Social History Main Topics  . Smoking status: Former Smoker    Quit date: 07/10/1981  . Smokeless tobacco: Never Used  . Alcohol use No  . Drug use: No  . Sexual activity: Not on file   Other Topics Concern  . Not on file   Social History Narrative   Regular exercise          Family History  Problem Relation Age of Onset  . Heart attack Father 35    Review of Systems:  As stated in the HPI and otherwise negative.   BP (!) 120/52   Pulse (!) 57   Ht 5\' 11"  (1.803 m)   Wt 175 lb 3.2 oz (79.5 kg)   SpO2 94%   BMI 24.44 kg/m   Physical Examination: General: Well developed, well nourished, NAD  HEENT: OP clear, mucus membranes moist  SKIN: warm, dry. No rashes. Neuro: No focal deficits  Musculoskeletal: Muscle strength 5/5 all ext  Psychiatric: Mood and affect normal  Neck: No JVD, no carotid bruits, no thyromegaly, no lymphadenopathy.  Lungs:Clear bilaterally, no wheezes, rhonci, crackles Cardiovascular: Regular rate and rhythm. No murmurs, gallops or rubs. Abdomen:Soft. Bowel sounds present. Non-tender.  Extremities: No lower extremity edema. Pulses are 2 + in the bilateral DP/PT.  Cardiac cath March  2007:  Left main had a 20% stenosis.  LAD was a long vessel which wrapped the apex. It gave off a very large  branching first diagonal and a tiny second diagonal. The mid-LAD at the  takeoff of a small second diagonal and large septal perforator there was  tubular stenosis which appeared to be 70-80%.  The left circumflex was a moderate-sized vessel. It gave off a moderate-  sized ramus, small to moderate OM-1 and a moderate-sized OM-2. There was a  99% stenosis in the mid-left circumflex prior to the takeoff of the OM-1. In  the proximal portion of the OM-2, there was a 90% stenosis. There was a 50%  stenosis in the distal OM-2.  Right coronary was a large dominant vessel. It gave off a large PDA, one  small PL, and a moderate-sized PL. There was a 50% tubular lesion in the mid-  portion. There was a 30% lesion around the distal bend.  Left ventriculogram done in the RAO position showed an EF of 55-60% with a  question of mild anterior hypokinesis. In the lateral projection, the EF was  60% with no evidence of a lateral wall motion abnormality.  EKG:  EKG is  Not ordered today. The ekg ordered today demonstrates   Recent Labs: 11/14/2016: ALT 12; BUN 24; Creatinine, Ser 1.30; Hemoglobin 15.6; Platelets 199; Potassium 4.3; Sodium 137   Lipid Panel    Component Value Date/Time   CHOL 172 06/14/2016 1133   TRIG 122 06/14/2016 1133   HDL 42 06/14/2016 1133   CHOLHDL 4.1 06/14/2016 1133   VLDL 24 06/14/2016 1133   LDLCALC 106 (H) 06/14/2016 1133     Wt Readings from Last 3 Encounters:  02/14/17 175 lb 3.2 oz (79.5 kg)  01/15/17 177 lb 6.4 oz (80.5 kg)  11/21/16 170 lb (77.1 kg)     Other studies Reviewed: Additional studies/ records that were reviewed today include: . Review of the above records demonstrates:    Assessment and Plan:   1. CAD without angina: No chest pain suggestive of angina. He is on ASA, Plavix, Lisinopril and a statin. He is not on a beta blocker due to  bradycardia.     2. HTN: BP is controlled. No changes.   3. Dizziness: Echo with normal LV function. He does have PVCs and short run of VT but cannot correlate this to his symptoms. He has rare dizziness. His symptoms are mostly related to weakness in his legs.   4. Leg weakness: This does not sound like PAD. He has no claudication and has good pedal pulses. This  sounds neurological. He is having a brain MRI today per Primary care.    Current medicines are reviewed at length with the patient today.  The patient does not have concerns regarding medicines.  The following changes have been made:  no change  Labs/ tests ordered today include:   No orders of the defined types were placed in this encounter.   Disposition:   FU with me in 6  months  Signed, Lauree Chandler, MD 02/14/2017 10:17 AM    [facility] [place] [place], [place], [place]  [address] Phone: [phone]; Fax: [phone]

## 2017-02-26 DIAGNOSIS — N400 Enlarged prostate without lower urinary tract symptoms: Secondary | ICD-10-CM | POA: Diagnosis not present

## 2017-02-26 DIAGNOSIS — Z6824 Body mass index (BMI) 24.0-24.9, adult: Secondary | ICD-10-CM | POA: Diagnosis not present

## 2017-02-26 DIAGNOSIS — R413 Other amnesia: Secondary | ICD-10-CM | POA: Diagnosis not present

## 2017-02-26 DIAGNOSIS — G6289 Other specified polyneuropathies: Secondary | ICD-10-CM | POA: Diagnosis not present

## 2017-02-26 DIAGNOSIS — R2689 Other abnormalities of gait and mobility: Secondary | ICD-10-CM | POA: Diagnosis not present

## 2017-02-26 DIAGNOSIS — I7389 Other specified peripheral vascular diseases: Secondary | ICD-10-CM | POA: Diagnosis not present

## 2017-02-26 DIAGNOSIS — R42 Dizziness and giddiness: Secondary | ICD-10-CM | POA: Diagnosis not present

## 2017-02-26 DIAGNOSIS — E538 Deficiency of other specified B group vitamins: Secondary | ICD-10-CM | POA: Diagnosis not present

## 2017-05-05 ENCOUNTER — Other Ambulatory Visit: Payer: Self-pay | Admitting: Cardiovascular Disease

## 2017-08-08 ENCOUNTER — Emergency Department (HOSPITAL_COMMUNITY): Payer: Medicare Other

## 2017-08-08 ENCOUNTER — Encounter (HOSPITAL_COMMUNITY): Payer: Self-pay | Admitting: *Deleted

## 2017-08-08 ENCOUNTER — Inpatient Hospital Stay (HOSPITAL_COMMUNITY)
Admission: EM | Admit: 2017-08-08 | Discharge: 2017-08-13 | DRG: 246 | Disposition: A | Discharge status: Home Health | Payer: Medicare Other | Attending: Cardiovascular Disease | Admitting: Cardiovascular Disease

## 2017-08-08 ENCOUNTER — Other Ambulatory Visit: Payer: Self-pay

## 2017-08-08 DIAGNOSIS — T82855A Stenosis of coronary artery stent, initial encounter: Secondary | ICD-10-CM | POA: Diagnosis present

## 2017-08-08 DIAGNOSIS — I5023 Acute on chronic systolic (congestive) heart failure: Secondary | ICD-10-CM | POA: Diagnosis present

## 2017-08-08 DIAGNOSIS — Z79899 Other long term (current) drug therapy: Secondary | ICD-10-CM

## 2017-08-08 DIAGNOSIS — Z7982 Long term (current) use of aspirin: Secondary | ICD-10-CM

## 2017-08-08 DIAGNOSIS — R079 Chest pain, unspecified: Secondary | ICD-10-CM

## 2017-08-08 DIAGNOSIS — R1013 Epigastric pain: Secondary | ICD-10-CM | POA: Diagnosis not present

## 2017-08-08 DIAGNOSIS — Z888 Allergy status to other drugs, medicaments and biological substances status: Secondary | ICD-10-CM

## 2017-08-08 DIAGNOSIS — Z955 Presence of coronary angioplasty implant and graft: Secondary | ICD-10-CM

## 2017-08-08 DIAGNOSIS — I214 Non-ST elevation (NSTEMI) myocardial infarction: Secondary | ICD-10-CM | POA: Diagnosis not present

## 2017-08-08 DIAGNOSIS — Y831 Surgical operation with implant of artificial internal device as the cause of abnormal reaction of the patient, or of later complication, without mention of misadventure at the time of the procedure: Secondary | ICD-10-CM | POA: Diagnosis present

## 2017-08-08 DIAGNOSIS — R110 Nausea: Secondary | ICD-10-CM | POA: Diagnosis not present

## 2017-08-08 DIAGNOSIS — I252 Old myocardial infarction: Secondary | ICD-10-CM

## 2017-08-08 DIAGNOSIS — I4891 Unspecified atrial fibrillation: Secondary | ICD-10-CM | POA: Diagnosis present

## 2017-08-08 DIAGNOSIS — Z87891 Personal history of nicotine dependence: Secondary | ICD-10-CM

## 2017-08-08 DIAGNOSIS — I493 Ventricular premature depolarization: Secondary | ICD-10-CM | POA: Diagnosis present

## 2017-08-08 DIAGNOSIS — I480 Paroxysmal atrial fibrillation: Secondary | ICD-10-CM | POA: Diagnosis present

## 2017-08-08 DIAGNOSIS — Z7902 Long term (current) use of antithrombotics/antiplatelets: Secondary | ICD-10-CM

## 2017-08-08 DIAGNOSIS — R109 Unspecified abdominal pain: Secondary | ICD-10-CM | POA: Diagnosis not present

## 2017-08-08 DIAGNOSIS — K5900 Constipation, unspecified: Secondary | ICD-10-CM | POA: Diagnosis not present

## 2017-08-08 DIAGNOSIS — R0789 Other chest pain: Secondary | ICD-10-CM | POA: Diagnosis not present

## 2017-08-08 DIAGNOSIS — N182 Chronic kidney disease, stage 2 (mild): Secondary | ICD-10-CM | POA: Diagnosis present

## 2017-08-08 DIAGNOSIS — E785 Hyperlipidemia, unspecified: Secondary | ICD-10-CM | POA: Diagnosis present

## 2017-08-08 DIAGNOSIS — I130 Hypertensive heart and chronic kidney disease with heart failure and stage 1 through stage 4 chronic kidney disease, or unspecified chronic kidney disease: Secondary | ICD-10-CM | POA: Diagnosis present

## 2017-08-08 DIAGNOSIS — I25110 Atherosclerotic heart disease of native coronary artery with unstable angina pectoris: Secondary | ICD-10-CM | POA: Diagnosis present

## 2017-08-08 DIAGNOSIS — I739 Peripheral vascular disease, unspecified: Secondary | ICD-10-CM | POA: Diagnosis present

## 2017-08-08 DIAGNOSIS — I10 Essential (primary) hypertension: Secondary | ICD-10-CM

## 2017-08-08 DIAGNOSIS — Z6823 Body mass index (BMI) 23.0-23.9, adult: Secondary | ICD-10-CM | POA: Diagnosis not present

## 2017-08-08 DIAGNOSIS — I255 Ischemic cardiomyopathy: Secondary | ICD-10-CM | POA: Diagnosis present

## 2017-08-08 DIAGNOSIS — Z8249 Family history of ischemic heart disease and other diseases of the circulatory system: Secondary | ICD-10-CM

## 2017-08-08 DIAGNOSIS — Z9861 Coronary angioplasty status: Secondary | ICD-10-CM | POA: Diagnosis not present

## 2017-08-08 HISTORY — DX: Peripheral vascular disease, unspecified: I73.9

## 2017-08-08 LAB — BASIC METABOLIC PANEL
ANION GAP: 11 (ref 5–15)
BUN: 26 mg/dL — ABNORMAL HIGH (ref 6–20)
CO2: 27 mmol/L (ref 22–32)
CREATININE: 1.26 mg/dL — AB (ref 0.61–1.24)
Calcium: 9.4 mg/dL (ref 8.9–10.3)
Chloride: 101 mmol/L (ref 101–111)
GFR calc Af Amer: 58 mL/min — ABNORMAL LOW (ref >60)
GFR, EST NON AFRICAN AMERICAN: 50 mL/min — AB (ref >60)
GLUCOSE: 160 mg/dL — AB (ref 65–99)
Potassium: 4 mmol/L (ref 3.5–5.1)
Sodium: 139 mmol/L (ref 135–145)

## 2017-08-08 LAB — CBC
HCT: 47.9 % (ref 39.0–52.0)
HEMOGLOBIN: 16.1 g/dL (ref 13.0–17.0)
MCH: 29.4 pg (ref 26.0–34.0)
MCHC: 33.6 g/dL (ref 30.0–36.0)
MCV: 87.6 fL (ref 78.0–100.0)
PLATELETS: 211 10*3/uL (ref 150–400)
RBC: 5.47 MIL/uL (ref 4.22–5.81)
RDW: 13.4 % (ref 11.5–15.5)
WBC: 13.2 10*3/uL — ABNORMAL HIGH (ref 4.0–10.5)

## 2017-08-08 LAB — I-STAT TROPONIN, ED: TROPONIN I, POC: 0.05 ng/mL (ref 0.00–0.08)

## 2017-08-08 LAB — TROPONIN I
Troponin I: 1.63 ng/mL (ref <0.03)
Troponin I: 6.54 ng/mL (ref <0.03)

## 2017-08-08 LAB — TSH: TSH: 0.926 u[IU]/mL (ref 0.350–4.500)

## 2017-08-08 MED ORDER — DILTIAZEM LOAD VIA INFUSION: 10.0000 mg | Freq: Once | INTRAVENOUS | Status: DC

## 2017-08-08 MED ORDER — HEPARIN BOLUS VIA INFUSION
4000.0000 [IU] | Freq: Once | INTRAVENOUS | Status: AC
  Administered 2017-08-08: 4000 [IU] via INTRAVENOUS
  Filled 2017-08-08: qty 4000

## 2017-08-08 MED ORDER — CLOPIDOGREL BISULFATE 75 MG PO TABS
75.0000 mg | ORAL_TABLET | Freq: Every day | ORAL | Status: DC
  Administered 2017-08-08 – 2017-08-13 (×6): 75 mg via ORAL
  Filled 2017-08-08 (×6): qty 1

## 2017-08-08 MED ORDER — SIMVASTATIN 20 MG PO TABS
40.0000 mg | ORAL_TABLET | Freq: Every day | ORAL | Status: DC
  Administered 2017-08-08 – 2017-08-09 (×2): 40 mg via ORAL
  Filled 2017-08-08: qty 2
  Filled 2017-08-08 (×2): qty 1

## 2017-08-08 MED ORDER — DILTIAZEM HCL-DEXTROSE 100-5 MG/100ML-% IV SOLN (PREMIX): 5.0000 mg/h | INTRAVENOUS | Status: DC

## 2017-08-08 MED ORDER — HEPARIN (PORCINE) IN NACL 100-0.45 UNIT/ML-% IJ SOLN
1200.0000 [IU]/h | INTRAMUSCULAR | Status: DC
  Administered 2017-08-08 – 2017-08-09 (×2): 1200 [IU]/h via INTRAVENOUS
  Filled 2017-08-08 (×2): qty 250

## 2017-08-08 MED ORDER — ASPIRIN EC 81 MG PO TBEC: 81.0000 mg | DELAYED_RELEASE_TABLET | Freq: Every day | ORAL | Status: DC

## 2017-08-08 MED ORDER — ACETAMINOPHEN 325 MG PO TABS: 650.0000 mg | ORAL_TABLET | ORAL | Status: DC | PRN

## 2017-08-08 MED ORDER — ONDANSETRON HCL 4 MG/2ML IJ SOLN
4.0000 mg | Freq: Four times a day (QID) | INTRAMUSCULAR | Status: DC | PRN
  Administered 2017-08-08 – 2017-08-09 (×2): 4 mg via INTRAVENOUS
  Filled 2017-08-08 (×2): qty 2

## 2017-08-08 MED ORDER — METOPROLOL SUCCINATE ER 25 MG PO TB24
25.0000 mg | ORAL_TABLET | Freq: Every day | ORAL | Status: DC
  Administered 2017-08-08 – 2017-08-10 (×3): 25 mg via ORAL
  Filled 2017-08-08 (×4): qty 1

## 2017-08-08 MED ORDER — NITROGLYCERIN 0.4 MG SL SUBL
0.4000 mg | SUBLINGUAL_TABLET | SUBLINGUAL | Status: DC | PRN
  Administered 2017-08-08 (×2): 0.4 mg via SUBLINGUAL
  Filled 2017-08-08: qty 1

## 2017-08-08 NOTE — Progress Notes (Signed)
CRITICAL VALUE ALERT  Critical Value:  Troponin= 1.63  Date & Time Notied:  08/08/17 1825  Provider Notified: yes  Orders Received/Actions taken: no new orders

## 2017-08-08 NOTE — Progress Notes (Signed)
ANTICOAGULATION CONSULT NOTE - Initial Consult  Pharmacy Consult for Heparin Indication: atrial fibrillation  Allergies  Allergen Reactions  . Gadolinium Derivatives Hives and Itching    Dr. Jeralyn Ruths s/w Mr. Sensabaugh. We observed him for 15 minutes. He did not need to take Benadryl. The symptoms began to subside before he left.     Patient Measurements: Height: 5\' 11"  (180.3 cm) Weight: 177 lb (80.3 kg) IBW/kg (Calculated) : 75.3 Heparin Dosing Weight: 80.3kg  Vital Signs: Temp: 96.8 F (36 C) (01/30 1055) Temp Source: Oral (01/30 1055) BP: 139/94 (01/30 1400) Pulse Rate: 87 (01/30 1400)  Labs: Recent Labs    08/08/17 1059  HGB 16.1  HCT 47.9  PLT 211  CREATININE 1.26*    Estimated Creatinine Clearance: 45.7 mL/min (A) (by C-G formula based on SCr of 1.26 mg/dL (H)).   Medical History: Past Medical History:  Diagnosis Date  . Arthritis   . Chest pain, unspecified   . Coronary atherosclerosis of unspecified type of vessel, native or graft   . Dizziness and giddiness   . Hypertension   . Myocardial infarction ([place])   . PVD (peripheral vascular disease) ([place])   . Shortness of breath    exertion    Medications:  Not on anticoagulant PTA  Assessment: 87yom presented to ED for chest pain. Neg POC trop x1. Pharmacy to dose heparin for new onset Afib. Scr 1.26. CBC WNL. No bleeding noted.  Goal of Therapy:  Heparin level 0.3-0.7 units/ml Monitor platelets by anticoagulation protocol: Yes   Plan:  Give 4000 units bolus x 1 Start heparin infusion at 1200 units/hr  8 hour heparin level Daily heparin level and CBC  Javeria Briski 08/08/2017,3:22 PM

## 2017-08-08 NOTE — H&P (Signed)
History & Physical    Patient ID: [name] MRN: [id number], DOB/AGE: January 23, 1930   Admit date: 08/08/2017   Primary Physician: Marton Redwood, MD Primary Cardiologist: Angelena Form   Patient Profile    84 yo male with PMH of CAD s/p stenting of p/mLcx, and mLAD ('07), HTN who presented with epigastric pain starting this morning.   Past Medical History   Past Medical History:  Diagnosis Date  . Arthritis   . Chest pain, unspecified   . Coronary atherosclerosis of unspecified type of vessel, native or graft   . Dizziness and giddiness   . Hypertension   . Myocardial infarction ([place])   . PVD (peripheral vascular disease) ([place])   . Shortness of breath    exertion    Past Surgical History:  Procedure Laterality Date  . CORONARY ANGIOPLASTY     4 stents  . INGUINAL HERNIA REPAIR  06/14/2012   Procedure: HERNIA REPAIR INGUINAL ADULT;  Surgeon: Odis Hollingshead, MD;  Location: [facility];  Service: General;  Laterality: Right;  . INSERTION OF MESH  06/14/2012   Procedure: INSERTION OF MESH;  Surgeon: Odis Hollingshead, MD;  Location: [facility];  Service: General;  Laterality: Right;  . SINUS SURGERY WITH INSTATRAK  1985     Allergies  Allergies  Allergen Reactions  . Gadolinium Derivatives Hives and Itching    Dr. Jeralyn Ruths s/w [name]. We observed him for 15 minutes. He did not need to take Benadryl. The symptoms began to subside before he left.     History of Present Illness    [name] is a 84 yo male with PMH of CAD s/p stenting of p/mLcx, and mLAD ('07), HTN. He has not had any invasive testing since that time. Last echo was 7/18 that showed normal EF with no valvular disease. He was last seen in the office on 8/18 for routine follow up. No issues reported that visit. Of note has worn a holter monitor in the past with PACs, and PVCs noted.   Presented to the ED today from PCP office. States he is very active and wakes up at 5am everyday. This morning woke up and developed  significant abd/epigastric cramping around 6am. Had some nausea and diaphoresis with this. Symptoms waxed and waned but persisted throughout the morning. Eventually presented to PCP office and found to be in new onset Afib. He was sent to the ED for further work up. Of note he does not remember have an chest pain with previous stent placement back in 2007.   In the ED his labs showed stable electrolytes, POC trop 0.05, Hgb 16.1, Cr 1.26. CXR was negative. EKG showed Afib with nonspecific IVCD. He was giving 2 SL nitro and abd cramping subsided.   Home Medications    Prior to Admission medications   Medication Sig Start Date End Date Taking? Authorizing Provider  aspirin 325 MG EC tablet Take 325 mg by mouth daily.   Yes [provider]  clopidogrel (PLAVIX) 75 MG tablet TAKE ONE TABLET EACH DAY 11/03/16  Yes Burnell Blanks, MD  lisinopril (PRINIVIL,ZESTRIL) 20 MG tablet TAKE ONE TABLET EACH DAY 11/03/16  Yes Burnell Blanks, MD  ranitidine (ZANTAC) 150 MG capsule Take 1 capsule (150 mg total) by mouth 2 (two) times daily. 11/21/16  Yes Levin Erp, PA  simvastatin (ZOCOR) 40 MG tablet TAKE ONE TABLET EVERY DAY 05/07/17  Yes Burnell Blanks, MD  nitroGLYCERIN (NITROSTAT) 0.4 MG SL tablet Place  0.4 mg under the tongue every 5 (five) minutes as needed for chest pain.    [provider]    Family History    Family History  Problem Relation Age of Onset  . Heart attack Father 53    Social History    Social History   Socioeconomic History  . Marital status: Divorced    Spouse name: Not on file  . Number of children: 3  . Years of education: Not on file  . Highest education level: Not on file  Social Needs  . Financial resource strain: Not on file  . Food insecurity - worry: Not on file  . Food insecurity - inability: Not on file  . Transportation needs - medical: Not on file  . Transportation needs - non-medical: Not on file    Occupational History  . Occupation: Magazine features editor    Comment: [employer]  Tobacco Use  . Smoking status: Former Smoker    Last attempt to quit: 07/10/1981    Years since quitting: 36.1  . Smokeless tobacco: Never Used  Substance and Sexual Activity  . Alcohol use: No  . Drug use: No  . Sexual activity: Not on file  Other Topics Concern  . Not on file  Social History Narrative   Regular exercise           Review of Systems    See HPI  All other systems reviewed and are otherwise negative except as noted above.  Physical Exam    Blood pressure (!) 139/94, pulse 87, temperature (!) 96.8 F (36 C), temperature source Oral, resp. rate 20, SpO2 95 %.  General: Pleasant, older WM, NAD Psych: Normal affect. Neuro: Alert and oriented X 3. Moves all extremities spontaneously. HEENT: Normal  Neck: Supple without bruits or JVD. Lungs:  Resp regular and unlabored, CTA. Heart: Irreg Irreg, no s3, s4, or murmurs. Abdomen: Soft, non-tender, non-distended, BS + x 4.  Extremities: No clubbing, cyanosis or edema. DP/PT/Radials 2+ and equal bilaterally.  Labs    Troponin [facility] of Care Test) Recent Labs    08/08/17 1116  TROPIPOC 0.05   No results for input(s): CKTOTAL, CKMB, TROPONINI in the last 72 hours. Lab Results  Component Value Date   WBC 13.2 (H) 08/08/2017   HGB 16.1 08/08/2017   HCT 47.9 08/08/2017   MCV 87.6 08/08/2017   PLT 211 08/08/2017    Recent Labs  Lab 08/08/17 1059  NA 139  K 4.0  CL 101  CO2 27  BUN 26*  CREATININE 1.26*  CALCIUM 9.4  GLUCOSE 160*   Lab Results  Component Value Date   CHOL 172 06/14/2016   HDL 42 06/14/2016   LDLCALC 106 (H) 06/14/2016   TRIG 122 06/14/2016   No results found for: [facility]   Radiology Studies    Dg Chest 2 View  Result Date: 08/08/2017 CLINICAL DATA:  84 year old male with discomfort in chest and upper abdomen while eating breakfast this morning. EXAM: CHEST  2 VIEW COMPARISON:  No  priors. FINDINGS: Lung volumes are normal. No consolidative airspace disease. No pleural effusions. No pneumothorax. No pulmonary nodule or mass noted. Pulmonary vasculature and the cardiomediastinal silhouette are within normal limits. Aortic atherosclerosis. IMPRESSION: 1. No radiographic evidence of acute cardiopulmonary disease. 2. Aortic atherosclerosis. Electronically Signed   By: Vinnie Langton M.D.   On: 08/08/2017 12:38    ECG & Cardiac Imaging    EKG: Afib with nonspecfic IVCD  Echo: 7/18  Study  Conclusions  - Left ventricle: The cavity size was normal. Wall thickness was   normal. Systolic function was normal. The estimated ejection   fraction was in the range of 55% to 60%. Wall motion was normal;   there were no regional wall motion abnormalities. Doppler   parameters are consistent with abnormal left ventricular   relaxation (grade 1 diastolic dysfunction). - Aortic valve: Trileaflet; mildly thickened, mildly calcified   leaflets. - Aorta: Ascending aortic diameter: 37 mm (S). - Ascending aorta: The ascending aorta was mildly dilated.  Assessment & Plan    84 yo male with PMH of CAD s/p stenting of p/mLcx, and mLAD ('07), HTN who presented with epigastric pain starting this morning.   1. New onset Afib: Unclear when this started, could have been when he developed epigastric pain but no palpitations noted. His rate is stable while in the ED. CHA2DS2-VASc Score of at least 3, therefore will need [place].  -- start IV heparin for now until further work can be determined -- add low dose Toprol XL, monitor HR as he has been bradycardic in the past.   2. Epigastric pain: developed at 6am this morning and persisted. Was given 2 SL nitro in the ED. Could be his anginal equivalent, as he does not remember his symptoms with previous stent placement.  -- cycle troponins  -- if positive will plan for cath, if negative may consider other noninvasive testing.   3. HTN: Stable rec's  above.  -- hold home lisinopril to allow Bp room for Toprol  4. Hx of CAD s/p stents ('07): No reported anginal symptoms prior to admission. Very independent and active. -- continue ASA, plavix  Signed, Reino Bellis, NP-C Pager [phone] 08/08/2017, 2:30 PM   I have personally seen and examined this patient. I agree with the assessment and plan as outlined above.  [name] is known to have CAD with prior stenting of the Circ and LAD in 2007 but has done well since then with normal LV systolic function by echo in 2018.  He has been very active and has had no chest pain prior to today.  Woke up with epigastric pain. Found to be in atrial fib with RVR in primary care. Sent to ED Rate was 120s. Now 80s and feeling better. Still having some epigastric pain that comes and goes.  No dyspnea Labs reviewed and ok. Troponin neg x 1.  EKG without any ischemic changes.   General: Well developed, well nourished, NAD  HEENT: OP clear, mucus membranes moist  SKIN: warm, dry. No rashes. Neuro: No focal deficits  Musculoskeletal: Muscle strength 5/5 all ext  Psychiatric: Mood and affect normal  Neck: No JVD, no carotid bruits, no thyromegaly, no lymphadenopathy.  Lungs:Clear bilaterally, no wheezes, rhonci, crackles Cardiovascular: Irreg irreg. No murmurs, gallops or rubs. Abdomen:Soft. Bowel sounds present. Non-tender.  Extremities: No lower extremity edema. Pulses are 2 + in the bilateral DP/PT.  1. Atrial fib with RVR: rate is better controlled. Will start Toprol 25 mg daily. Will start IV heparin. He will need to be started on a DOAC prior to discharge. Will delay starting this as he may need a cath. I have reviewed atrial fib in detail including prognosis and treatment.   2. Epigastric pain: This may be c/w angina. Troponin is negative. No recent cath. We have discussed the possibility of a cath tomorrow. Will cycle troponin tonight. NPO at midnight. Will make further plans for cath in  the am.  Lauree Chandler 08/08/2017 4:02 PM

## 2017-08-08 NOTE — ED Notes (Addendum)
Pt's daughter wanted to make [place] aware pt had a recent dental procedure.

## 2017-08-08 NOTE — ED Provider Notes (Signed)
[facility] EMERGENCY DEPARTMENT Provider Note   CSN: [id number] Arrival date & time: 08/08/17  1046     History   Chief Complaint Chief Complaint  Patient presents with  . Chest Pain    HPI [name] is a 84 y.o. male.  Patient is a 84 year old male with a history of coronary artery disease, hypertension, peripheral vascular disease and hyperlipidemia who presents with epigastric pain.  He states he woke up this morning and ate breakfast.  Shortly after that he developed discomfort in his epigastrium.  It is nonradiating.  He has some associated nausea and diaphoresis.  No shortness of breath.  The pain is not worse with exertion.  He has not taken anything for the symptoms.  He went to his PCPs office and was noted to be in atrial fibrillation which is new for him.  He was given aspirin by EMS.  He states he still having some discomfort to his epigastrium.  He denies history of similar symptoms in the past.      Past Medical History:  Diagnosis Date  . Arthritis   . Chest pain, unspecified   . Coronary atherosclerosis of unspecified type of vessel, native or graft   . Dizziness and giddiness   . Hypertension   . Myocardial infarction ([place])   . PVD (peripheral vascular disease) ([place])   . Shortness of breath    exertion    Patient Active Problem List   Diagnosis Date Noted  . Atrial fibrillation ([place]) 08/08/2017  . HYPERLIPIDEMIA-MIXED 11/09/2009  . LBBB 11/09/2009  . HYPERTENSION, BENIGN 11/27/2008  . CAD, UNSPECIFIED SITE 10/05/2008  . DIZZINESS 10/05/2008  . CHEST PAIN-UNSPECIFIED 10/05/2008    Past Surgical History:  Procedure Laterality Date  . CORONARY ANGIOPLASTY     4 stents  . INGUINAL HERNIA REPAIR  06/14/2012   Procedure: HERNIA REPAIR INGUINAL ADULT;  Surgeon: Odis Hollingshead, MD;  Location: [facility];  Service: General;  Laterality: Right;  . INSERTION OF MESH  06/14/2012   Procedure: INSERTION OF MESH;  Surgeon: Odis Hollingshead, MD;  Location: [facility];  Service: General;  Laterality: Right;  . [place] Medications    Prior to Admission medications   Medication Sig Start Date End Date Taking? Authorizing Provider  aspirin 325 MG EC tablet Take 325 mg by mouth daily.   Yes [provider]  clopidogrel (PLAVIX) 75 MG tablet TAKE ONE TABLET EACH DAY 11/03/16  Yes Burnell Blanks, MD  lisinopril (PRINIVIL,ZESTRIL) 20 MG tablet TAKE ONE TABLET EACH DAY 11/03/16  Yes Burnell Blanks, MD  ranitidine (ZANTAC) 150 MG capsule Take 1 capsule (150 mg total) by mouth 2 (two) times daily. 11/21/16  Yes Levin Erp, PA  simvastatin (ZOCOR) 40 MG tablet TAKE ONE TABLET EVERY DAY 05/07/17  Yes Burnell Blanks, MD  nitroGLYCERIN (NITROSTAT) 0.4 MG SL tablet Place 0.4 mg under the tongue every 5 (five) minutes as needed for chest pain.    [provider]    Family History Family History  Problem Relation Age of Onset  . Heart attack Father 64    Social History Social History   Tobacco Use  . Smoking status: Former Smoker    Last attempt to quit: 07/10/1981    Years since quitting: 36.1  . Smokeless tobacco: Never Used  Substance Use Topics  . Alcohol use: No  . Drug use: No  Allergies   Gadolinium derivatives   Review of Systems Review of Systems  Constitutional: Positive for diaphoresis. Negative for chills, fatigue and fever.  HENT: Negative for congestion, rhinorrhea and sneezing.   Eyes: Negative.   Respiratory: Negative for cough, chest tightness and shortness of breath.   Cardiovascular: Negative for chest pain and leg swelling.  Gastrointestinal: Positive for abdominal pain and nausea. Negative for blood in stool, diarrhea and vomiting.       Epigastric pain  Genitourinary: Negative for difficulty urinating, flank pain, frequency and hematuria.  Musculoskeletal: Negative for arthralgias and back pain.    Skin: Negative for rash.  Neurological: Negative for dizziness, speech difficulty, weakness, numbness and headaches.     Physical Exam Updated Vital Signs BP (!) 119/97   Pulse 88   Temp (!) 96.8 F (36 C) (Oral)   Resp 15   Ht 5\' 11"  (1.803 m)   Wt 80.3 kg (177 lb)   SpO2 97%   BMI 24.69 kg/m   Physical Exam  Constitutional: He is oriented to person, place, and time. He appears well-developed and well-nourished.  HENT:  Head: Normocephalic and atraumatic.  Eyes: Pupils are equal, round, and reactive to light.  Neck: Normal range of motion. Neck supple.  Cardiovascular: Normal rate and normal heart sounds. An irregularly irregular rhythm present.  Pulmonary/Chest: Effort normal and breath sounds normal. No respiratory distress. He has no wheezes. He has no rales. He exhibits no tenderness.  Abdominal: Soft. Bowel sounds are normal. There is no tenderness. There is no rebound and no guarding.  Musculoskeletal: Normal range of motion.       Right lower leg: He exhibits edema (Trace edema bilaterally).  Lymphadenopathy:    He has no cervical adenopathy.  Neurological: He is alert and oriented to person, place, and time.  Skin: Skin is warm and dry. No rash noted.  Psychiatric: He has a normal mood and affect.     ED Treatments / Results  Labs (all labs ordered are listed, but only abnormal results are displayed) Labs Reviewed  BASIC METABOLIC PANEL - Abnormal; Notable for the following components:      Result Value   Glucose, Bld 160 (*)    BUN 26 (*)    Creatinine, Ser 1.26 (*)    GFR calc non Af Amer 50 (*)    GFR calc Af Amer 58 (*)    All other components within normal limits  CBC - Abnormal; Notable for the following components:   WBC 13.2 (*)    All other components within normal limits  HEPARIN LEVEL (UNFRACTIONATED)  I-STAT TROPONIN, ED    EKG  EKG Interpretation  Date/Time:  Wednesday August 08 2017 15:29:57 EST Ventricular Rate:  95 PR  Interval:    QRS Duration: 123 QT Interval:  397 QTC Calculation: 515 R Axis:   61 Text Interpretation:  Atrial fibrillation Left bundle branch block Confirmed by Malvin Johns [phone]) on 08/08/2017 3:33:00 PM       Radiology Dg Chest 2 View  Result Date: 08/08/2017 CLINICAL DATA:  84 year old male with discomfort in chest and upper abdomen while eating breakfast this morning. EXAM: CHEST  2 VIEW COMPARISON:  No priors. FINDINGS: Lung volumes are normal. No consolidative airspace disease. No pleural effusions. No pneumothorax. No pulmonary nodule or mass noted. Pulmonary vasculature and the cardiomediastinal silhouette are within normal limits. Aortic atherosclerosis. IMPRESSION: 1. No radiographic evidence of acute cardiopulmonary disease. 2. Aortic atherosclerosis. Electronically Signed   By: Quillian Quince  Entrikin M.D.   On: 08/08/2017 12:38    Procedures Procedures (including critical care time)  Medications Ordered in ED Medications  nitroGLYCERIN (NITROSTAT) SL tablet 0.4 mg (0.4 mg Sublingual Given 08/08/17 1249)  heparin bolus via infusion 4,000 Units (not administered)  heparin ADULT infusion 100 units/mL (25000 units/280mL sodium chloride 0.45%) (not administered)     Initial Impression / Assessment and Plan / ED Course  I have reviewed the triage vital signs and the nursing notes.  Pertinent labs & imaging results that were available during my care of the patient were reviewed by me and considered in my medical decision making (see chart for details).     Patient is a 84 year old male who presents with epigastric pain and nausea.  He has an EKG which shows new onset atrial fibrillation with a controlled rate.  He had a negative troponin x1.  He was given aspirin.  He was given nitroglycerin which resolved his epigastric discomfort.  I spoke with cardiology who has evaluated patient and will admit the patient for further evaluation.  This patients CHA2DS2-VASc Score and  unadjusted Ischemic Stroke Rate (% per year) is equal to 4.8 % stroke rate/year from a score of 4  Above score calculated as 1 point each if present [CHF, HTN, DM, Vascular=MI/PAD/Aortic Plaque, Age if 65-74, or Male] Above score calculated as 2 points each if present [Age > 75, or Stroke/TIA/TE]    Final Clinical Impressions(s) / ED Diagnoses   Final diagnoses:  Nonspecific chest pain  New onset atrial fibrillation [facility])    ED Discharge Orders    None       Malvin Johns, MD 08/08/17 1535

## 2017-08-08 NOTE — ED Triage Notes (Signed)
Pt woke up this am fine and was cooking breakfast.  Then developed epigastric pain, nausea, and diaphoresis that would come every 15 minutes.  Pt went to MD and sent here for a new onset of afib 80-110s.  Pt was given ASA 324mg  po by ems.

## 2017-08-08 NOTE — ED Notes (Signed)
Patient transported to X-ray 

## 2017-08-08 NOTE — ED Notes (Addendum)
Cardiology at bedside.

## 2017-08-09 ENCOUNTER — Encounter (HOSPITAL_COMMUNITY)
Admission: EM | Disposition: A | Discharge status: Home Health | Payer: Self-pay | Source: Home / Self Care | Attending: Cardiovascular Disease

## 2017-08-09 DIAGNOSIS — I130 Hypertensive heart and chronic kidney disease with heart failure and stage 1 through stage 4 chronic kidney disease, or unspecified chronic kidney disease: Secondary | ICD-10-CM | POA: Diagnosis not present

## 2017-08-09 DIAGNOSIS — I214 Non-ST elevation (NSTEMI) myocardial infarction: Secondary | ICD-10-CM

## 2017-08-09 DIAGNOSIS — T82855A Stenosis of coronary artery stent, initial encounter: Secondary | ICD-10-CM | POA: Diagnosis not present

## 2017-08-09 DIAGNOSIS — Z87891 Personal history of nicotine dependence: Secondary | ICD-10-CM | POA: Diagnosis not present

## 2017-08-09 DIAGNOSIS — I2510 Atherosclerotic heart disease of native coronary artery without angina pectoris: Secondary | ICD-10-CM

## 2017-08-09 DIAGNOSIS — Z7902 Long term (current) use of antithrombotics/antiplatelets: Secondary | ICD-10-CM | POA: Diagnosis not present

## 2017-08-09 DIAGNOSIS — N189 Chronic kidney disease, unspecified: Secondary | ICD-10-CM | POA: Diagnosis not present

## 2017-08-09 DIAGNOSIS — E785 Hyperlipidemia, unspecified: Secondary | ICD-10-CM | POA: Diagnosis not present

## 2017-08-09 DIAGNOSIS — I5022 Chronic systolic (congestive) heart failure: Secondary | ICD-10-CM | POA: Diagnosis not present

## 2017-08-09 DIAGNOSIS — Z79899 Other long term (current) drug therapy: Secondary | ICD-10-CM | POA: Diagnosis not present

## 2017-08-09 DIAGNOSIS — Z7982 Long term (current) use of aspirin: Secondary | ICD-10-CM | POA: Diagnosis not present

## 2017-08-09 DIAGNOSIS — I10 Essential (primary) hypertension: Secondary | ICD-10-CM | POA: Diagnosis not present

## 2017-08-09 DIAGNOSIS — E7849 Other hyperlipidemia: Secondary | ICD-10-CM | POA: Diagnosis not present

## 2017-08-09 DIAGNOSIS — Z8249 Family history of ischemic heart disease and other diseases of the circulatory system: Secondary | ICD-10-CM | POA: Diagnosis not present

## 2017-08-09 DIAGNOSIS — I4891 Unspecified atrial fibrillation: Secondary | ICD-10-CM | POA: Diagnosis not present

## 2017-08-09 DIAGNOSIS — N182 Chronic kidney disease, stage 2 (mild): Secondary | ICD-10-CM | POA: Diagnosis not present

## 2017-08-09 DIAGNOSIS — I480 Paroxysmal atrial fibrillation: Secondary | ICD-10-CM | POA: Diagnosis not present

## 2017-08-09 DIAGNOSIS — I252 Old myocardial infarction: Secondary | ICD-10-CM | POA: Diagnosis not present

## 2017-08-09 DIAGNOSIS — I25110 Atherosclerotic heart disease of native coronary artery with unstable angina pectoris: Secondary | ICD-10-CM | POA: Diagnosis not present

## 2017-08-09 DIAGNOSIS — I493 Ventricular premature depolarization: Secondary | ICD-10-CM | POA: Diagnosis not present

## 2017-08-09 DIAGNOSIS — R1013 Epigastric pain: Secondary | ICD-10-CM | POA: Diagnosis not present

## 2017-08-09 DIAGNOSIS — R002 Palpitations: Secondary | ICD-10-CM | POA: Diagnosis not present

## 2017-08-09 DIAGNOSIS — I739 Peripheral vascular disease, unspecified: Secondary | ICD-10-CM | POA: Diagnosis not present

## 2017-08-09 DIAGNOSIS — K5900 Constipation, unspecified: Secondary | ICD-10-CM | POA: Diagnosis not present

## 2017-08-09 DIAGNOSIS — I255 Ischemic cardiomyopathy: Secondary | ICD-10-CM | POA: Diagnosis not present

## 2017-08-09 DIAGNOSIS — Y831 Surgical operation with implant of artificial internal device as the cause of abnormal reaction of the patient, or of later complication, without mention of misadventure at the time of the procedure: Secondary | ICD-10-CM | POA: Diagnosis present

## 2017-08-09 DIAGNOSIS — I5023 Acute on chronic systolic (congestive) heart failure: Secondary | ICD-10-CM | POA: Diagnosis not present

## 2017-08-09 DIAGNOSIS — Z888 Allergy status to other drugs, medicaments and biological substances status: Secondary | ICD-10-CM | POA: Diagnosis not present

## 2017-08-09 DIAGNOSIS — E782 Mixed hyperlipidemia: Secondary | ICD-10-CM | POA: Diagnosis not present

## 2017-08-09 DIAGNOSIS — I5021 Acute systolic (congestive) heart failure: Secondary | ICD-10-CM | POA: Diagnosis not present

## 2017-08-09 HISTORY — PX: LEFT HEART CATH AND CORONARY ANGIOGRAPHY: CATH118249

## 2017-08-09 HISTORY — PX: CORONARY STENT INTERVENTION: CATH118234

## 2017-08-09 LAB — BASIC METABOLIC PANEL
ANION GAP: 10 (ref 5–15)
BUN: 30 mg/dL — AB (ref 6–20)
CO2: 25 mmol/L (ref 22–32)
Calcium: 9 mg/dL (ref 8.9–10.3)
Chloride: 103 mmol/L (ref 101–111)
Creatinine, Ser: 1.38 mg/dL — ABNORMAL HIGH (ref 0.61–1.24)
GFR calc Af Amer: 52 mL/min — ABNORMAL LOW (ref >60)
GFR, EST NON AFRICAN AMERICAN: 45 mL/min — AB (ref >60)
Glucose, Bld: 146 mg/dL — ABNORMAL HIGH (ref 65–99)
POTASSIUM: 4.7 mmol/L (ref 3.5–5.1)
SODIUM: 138 mmol/L (ref 135–145)

## 2017-08-09 LAB — CBC
HEMATOCRIT: 45.6 % (ref 39.0–52.0)
Hemoglobin: 14.7 g/dL (ref 13.0–17.0)
MCH: 28.6 pg (ref 26.0–34.0)
MCHC: 32.2 g/dL (ref 30.0–36.0)
MCV: 88.7 fL (ref 78.0–100.0)
PLATELETS: 223 10*3/uL (ref 150–400)
RBC: 5.14 MIL/uL (ref 4.22–5.81)
RDW: 13.4 % (ref 11.5–15.5)
WBC: 12.5 10*3/uL — ABNORMAL HIGH (ref 4.0–10.5)

## 2017-08-09 LAB — POCT ACTIVATED CLOTTING TIME
Activated Clotting Time: 257 s
Activated Clotting Time: 274 s
Activated Clotting Time: 274 s

## 2017-08-09 LAB — LIPID PANEL
CHOL/HDL RATIO: 3.9 ratio
CHOLESTEROL: 160 mg/dL (ref 0–200)
HDL: 41 mg/dL (ref >40)
LDL Cholesterol: 104 mg/dL — ABNORMAL HIGH (ref 0–99)
Triglycerides: 74 mg/dL (ref <150)
VLDL: 15 mg/dL (ref 0–40)

## 2017-08-09 LAB — PROTIME-INR
INR: 1.1
Prothrombin Time: 14.1 seconds (ref 11.4–15.2)

## 2017-08-09 LAB — TROPONIN I: TROPONIN I: 20.28 ng/mL — AB (ref <0.03)

## 2017-08-09 LAB — HEPARIN LEVEL (UNFRACTIONATED)
Heparin Unfractionated: 0.55 IU/mL (ref 0.30–0.70)
Heparin Unfractionated: 0.61 IU/mL (ref 0.30–0.70)

## 2017-08-09 SURGERY — LEFT HEART CATH AND CORONARY ANGIOGRAPHY
Anesthesia: LOCAL

## 2017-08-09 MED ORDER — HEPARIN (PORCINE) IN NACL 2-0.9 UNIT/ML-% IJ SOLN
INTRAMUSCULAR | Status: AC
  Filled 2017-08-09: qty 1000

## 2017-08-09 MED ORDER — HEPARIN SODIUM (PORCINE) 1000 UNIT/ML IJ SOLN
INTRAMUSCULAR | Status: DC | PRN
  Administered 2017-08-09 (×2): 2000 [IU] via INTRAVENOUS
  Administered 2017-08-09 (×2): 4000 [IU] via INTRAVENOUS

## 2017-08-09 MED ORDER — MIDAZOLAM HCL 2 MG/2ML IJ SOLN
INTRAMUSCULAR | Status: DC | PRN
  Administered 2017-08-09: 1 mg via INTRAVENOUS

## 2017-08-09 MED ORDER — VERAPAMIL HCL 2.5 MG/ML IV SOLN
INTRAVENOUS | Status: DC | PRN
  Administered 2017-08-09: 10 mL via INTRA_ARTERIAL

## 2017-08-09 MED ORDER — MIDAZOLAM HCL 5 MG/5ML IJ SOLN
INTRAMUSCULAR | Status: AC
  Filled 2017-08-09: qty 5

## 2017-08-09 MED ORDER — SODIUM CHLORIDE 0.9% FLUSH: 3.0000 mL | Freq: Two times a day (BID) | INTRAVENOUS | Status: DC

## 2017-08-09 MED ORDER — SODIUM CHLORIDE 0.9 % WEIGHT BASED INFUSION
3.0000 mL/kg/h | INTRAVENOUS | Status: DC
  Administered 2017-08-09: 3 mL/kg/h via INTRAVENOUS

## 2017-08-09 MED ORDER — LIDOCAINE HCL (PF) 1 % IJ SOLN
INTRAMUSCULAR | Status: AC
  Filled 2017-08-09: qty 30

## 2017-08-09 MED ORDER — FENTANYL CITRATE (PF) 100 MCG/2ML IJ SOLN
INTRAMUSCULAR | Status: AC
  Filled 2017-08-09: qty 2

## 2017-08-09 MED ORDER — VERAPAMIL HCL 2.5 MG/ML IV SOLN
INTRAVENOUS | Status: AC
  Filled 2017-08-09: qty 2

## 2017-08-09 MED ORDER — SODIUM CHLORIDE 0.9 % IV SOLN: 250.0000 mL | INTRAVENOUS | Status: DC | PRN

## 2017-08-09 MED ORDER — HEPARIN (PORCINE) IN NACL 100-0.45 UNIT/ML-% IJ SOLN
1150.0000 [IU]/h | INTRAMUSCULAR | Status: DC
  Administered 2017-08-09: 1150 [IU]/h via INTRAVENOUS
  Filled 2017-08-09: qty 250

## 2017-08-09 MED ORDER — IOPAMIDOL (ISOVUE-370) INJECTION 76%
INTRAVENOUS | Status: AC
  Filled 2017-08-09: qty 100

## 2017-08-09 MED ORDER — HEPARIN SODIUM (PORCINE) 1000 UNIT/ML IJ SOLN
INTRAMUSCULAR | Status: AC
  Filled 2017-08-09: qty 1

## 2017-08-09 MED ORDER — ANGIOPLASTY BOOK
Freq: Once | Status: AC
  Administered 2017-08-09: 23:00:00 1
  Filled 2017-08-09: qty 1

## 2017-08-09 MED ORDER — SODIUM CHLORIDE 0.9 % WEIGHT BASED INFUSION: 1.0000 mL/kg/h | INTRAVENOUS | Status: DC

## 2017-08-09 MED ORDER — CLOPIDOGREL BISULFATE 300 MG PO TABS
ORAL_TABLET | ORAL | Status: DC | PRN
  Administered 2017-08-09: 300 mg via ORAL

## 2017-08-09 MED ORDER — HEPARIN (PORCINE) IN NACL 2-0.9 UNIT/ML-% IJ SOLN
INTRAMUSCULAR | Status: DC | PRN
  Administered 2017-08-09: 09:00:00

## 2017-08-09 MED ORDER — IOPAMIDOL (ISOVUE-370) INJECTION 76%
INTRAVENOUS | Status: AC
  Filled 2017-08-09: qty 50

## 2017-08-09 MED ORDER — HYDRALAZINE HCL 20 MG/ML IJ SOLN: 5.0000 mg | INTRAMUSCULAR | Status: AC | PRN

## 2017-08-09 MED ORDER — SODIUM CHLORIDE 0.9% FLUSH
3.0000 mL | Freq: Two times a day (BID) | INTRAVENOUS | Status: DC
  Administered 2017-08-09 – 2017-08-13 (×6): 3 mL via INTRAVENOUS

## 2017-08-09 MED ORDER — SODIUM CHLORIDE 0.9% FLUSH: 3.0000 mL | INTRAVENOUS | Status: DC | PRN

## 2017-08-09 MED ORDER — ASPIRIN 81 MG PO CHEW
81.0000 mg | CHEWABLE_TABLET | ORAL | Status: AC
  Administered 2017-08-09: 81 mg via ORAL
  Filled 2017-08-09: qty 1

## 2017-08-09 MED ORDER — HEART ATTACK BOUNCING BOOK
Freq: Once | Status: AC
  Administered 2017-08-09: 23:00:00 1
  Filled 2017-08-09: qty 1

## 2017-08-09 MED ORDER — LABETALOL HCL 5 MG/ML IV SOLN: 10.0000 mg | INTRAVENOUS | Status: AC | PRN

## 2017-08-09 MED ORDER — LIDOCAINE HCL (PF) 1 % IJ SOLN
INTRAMUSCULAR | Status: DC | PRN
  Administered 2017-08-09: 2 mL

## 2017-08-09 MED ORDER — SODIUM CHLORIDE 0.9 % IV SOLN
INTRAVENOUS | Status: AC
  Administered 2017-08-09: 12:00:00 via INTRAVENOUS

## 2017-08-09 MED ORDER — NITROGLYCERIN 1 MG/10 ML FOR IR/CATH LAB
INTRA_ARTERIAL | Status: DC | PRN
  Administered 2017-08-09 (×2): 200 ug via INTRACORONARY

## 2017-08-09 MED ORDER — FENTANYL CITRATE (PF) 100 MCG/2ML IJ SOLN
INTRAMUSCULAR | Status: DC | PRN
  Administered 2017-08-09: 25 ug via INTRAVENOUS

## 2017-08-09 MED ORDER — IOPAMIDOL (ISOVUE-370) INJECTION 76%
INTRAVENOUS | Status: DC | PRN
  Administered 2017-08-09: 195 mL

## 2017-08-09 MED ORDER — CLOPIDOGREL BISULFATE 300 MG PO TABS
ORAL_TABLET | ORAL | Status: AC
  Filled 2017-08-09: qty 1

## 2017-08-09 MED ORDER — ASPIRIN 81 MG PO CHEW
81.0000 mg | CHEWABLE_TABLET | Freq: Every day | ORAL | Status: DC
  Administered 2017-08-10 – 2017-08-13 (×4): 81 mg via ORAL
  Filled 2017-08-09 (×4): qty 1

## 2017-08-09 MED ORDER — NITROGLYCERIN 1 MG/10 ML FOR IR/CATH LAB
INTRA_ARTERIAL | Status: AC
  Filled 2017-08-09: qty 10

## 2017-08-09 SURGICAL SUPPLY — 24 items
BALLN SAPPHIRE ~~LOC~~ 2.25X15 (BALLOONS) ×2 IMPLANT
BALLN SAPPHIRE ~~LOC~~ 3.0X15 (BALLOONS) ×2 IMPLANT
BALLN ~~LOC~~ EMERGE MR 2.5X15 (BALLOONS) ×2
BALLOON ~~LOC~~ EMERGE MR 2.5X15 (BALLOONS) ×1 IMPLANT
CATH 5FR JL3.5 JR4 ANG PIG MP (CATHETERS) ×2 IMPLANT
CATH LAUNCHER 6FR EBU 3.75 (CATHETERS) ×2 IMPLANT
CATH LAUNCHER 6FR EBU3.5 (CATHETERS) ×2 IMPLANT
CATH LAUNCHER 6FR JL4 (CATHETERS) ×2 IMPLANT
DEVICE RAD COMP TR BAND LRG (VASCULAR PRODUCTS) ×2 IMPLANT
GLIDESHEATH SLEND SS 6F .021 (SHEATH) ×2 IMPLANT
GUIDEWIRE INQWIRE 1.5J.035X260 (WIRE) ×1 IMPLANT
INQWIRE 1.5J .035X260CM (WIRE) ×2
KIT ENCORE 26 ADVANTAGE (KITS) ×2 IMPLANT
KIT ESSENTIALS PG (KITS) ×2 IMPLANT
KIT HEART LEFT (KITS) ×2 IMPLANT
PACK CARDIAC CATHETERIZATION (CUSTOM PROCEDURE TRAY) ×2 IMPLANT
STENT RESOLUTE ONYX 2.0X30 (Permanent Stent) ×2 IMPLANT
STENT RESOLUTE ONYX 2.5X18 (Permanent Stent) ×2 IMPLANT
STENT RESOLUTE ONYX 2.75X34 (Permanent Stent) ×2 IMPLANT
TRANSDUCER W/STOPCOCK (MISCELLANEOUS) ×2 IMPLANT
TUBING CIL FLEX 10 FLL-RA (TUBING) ×2 IMPLANT
WIRE ASAHI GRAND SLAM 180CM (WIRE) ×2 IMPLANT
WIRE ASAHI PROWATER 180CM (WIRE) ×2 IMPLANT
WIRE RUNTHROUGH .014X180CM (WIRE) ×2 IMPLANT

## 2017-08-09 NOTE — Progress Notes (Signed)
ANTICOAGULATION CONSULT NOTE - Follow Up Consult  Pharmacy Consult for heparin Indication: atrial fibrillation  Labs: Recent Labs    08/08/17 1059 08/08/17 1633 08/08/17 2206 08/08/17 2349  HGB 16.1  --   --   --   HCT 47.9  --   --   --   PLT 211  --   --   --   HEPARINUNFRC  --   --   --  0.55  CREATININE 1.26*  --   --   --   TROPONINI  --  1.63* 6.54*  --     Assessment/Plan:  84yo male therapeutic on heparin with initial dosing for Afib. Will continue gtt at current rate and confirm stable with am labs.   Wynona Neat, PharmD, BCPS  08/09/2017,1:29 AM

## 2017-08-09 NOTE — Progress Notes (Signed)
TR BAND REMOVAL  LOCATION:    right radial  DEFLATED PER PROTOCOL:    Yes.    TIME BAND OFF / DRESSING APPLIED:    1530   SITE UPON ARRIVAL:    Level 0  SITE AFTER BAND REMOVAL:    Level 1, bruise only  CIRCULATION SENSATION AND MOVEMENT:    Within Normal Limits   Yes.    Patient up to bathroom and back to bed at 1300. Noted that patient had a hematoma proximal to band. Pressure held and bruise only after. No further bleeding, no hematoma, bruise only when checked, remainder of shift

## 2017-08-09 NOTE — Interval H&P Note (Signed)
History and Physical Interval Note:  08/09/2017 8:53 AM  [name]  has presented today for cardiac catheterization, with the diagnosis of NSTEMI. The various methods of treatment have been discussed with the patient and family. After consideration of risks, benefits and other options for treatment, the patient has consented to  Procedure(s): LEFT HEART CATH AND CORONARY ANGIOGRAPHY (N/A) as a surgical intervention .  The patient's history has been reviewed, patient examined, no change in status, stable for surgery.  I have reviewed the patient's chart and labs.  Questions were answered to the patient's satisfaction.    Cath Lab Visit (complete for each Cath Lab visit)  Clinical Evaluation Leading to the Procedure:   ACS: Yes.    Non-ACS:  N/A  Cobain Morici

## 2017-08-09 NOTE — Progress Notes (Signed)
ANTICOAGULATION CONSULT NOTE - Initial Consult  Pharmacy Consult for Heparin Indication: atrial fibrillation  Allergies  Allergen Reactions  . Gadolinium Derivatives Hives and Itching    Dr. Jeralyn Ruths s/w [name]. We observed him for 15 minutes. He did not need to take Benadryl. The symptoms began to subside before he left.     Patient Measurements: Height: 5\' 11"  (180.3 cm) Weight: 144 lb 6.4 oz (65.5 kg)(scale c) IBW/kg (Calculated) : 75.3 Heparin Dosing Weight: 80 kg  Vital Signs: Temp: 98.6 F (37 C) (01/31 1130) Temp Source: Oral (01/31 1130) BP: 122/53 (01/31 1130) Pulse Rate: 59 (01/31 1130)  Labs: Recent Labs    08/08/17 1059 08/08/17 1633 08/08/17 2206 08/08/17 2349 08/09/17 0358  HGB 16.1  --   --   --  14.7  HCT 47.9  --   --   --  45.6  PLT 211  --   --   --  223  LABPROT  --   --   --   --  14.1  INR  --   --   --   --  1.10  HEPARINUNFRC  --   --   --  0.55 0.61  CREATININE 1.26*  --   --   --  1.38*  TROPONINI  --  1.63* 6.54*  --  20.28*    Estimated Creatinine Clearance: 36.3 mL/min (A) (by C-G formula based on SCr of 1.38 mg/dL (H)).     Assessment: 81 YOM presented to ED for chest pain, found to have Afib and started on IV heparin.  S/p cath with PCI today 08/09/17 and Pharmacy to resume heparin 8 hours post sheath removal.  Sheath has been removed but TR band is still in place.    Goal of Therapy:  Heparin level 0.3-0.7 units/ml Monitor platelets by anticoagulation protocol: Yes    Plan:  Once TR band removed and 8 hours later, resume heparin gtt at 1150 units/hr Check 8 hr heparin level Daily heparin level and CBC Monitor for s/sx of bleeding/hematoma   Dariana Garbett D. Mina Marble, PharmD, BCPS Pager:  [phone] 08/09/2017, 12:43 PM

## 2017-08-09 NOTE — H&P (View-Only) (Signed)
Progress Note  Patient Name: [name] Date of Encounter: 08/09/2017  Primary Cardiologist: Angelena Form  Subjective   Still having abdominal pain this am. No chest pain or SOB  Inpatient Medications    Scheduled Meds: . aspirin EC  81 mg Oral Daily  . clopidogrel  75 mg Oral Daily  . metoprolol succinate  25 mg Oral Daily  . simvastatin  40 mg Oral Daily   Continuous Infusions: . heparin 1,200 Units/hr (08/09/17 0606)   PRN Meds: acetaminophen, nitroGLYCERIN, ondansetron (ZOFRAN) IV   Vital Signs    Vitals:   08/08/17 1913 08/08/17 2353 08/09/17 0450 08/09/17 0508  BP: 133/62 (!) 149/80 127/70 124/64  Pulse: 69 68 68 (!) 54  Resp: 18 18 18 18   Temp: (!) 97.5 F (36.4 C) 98.5 F (36.9 C) (!) 97.4 F (36.3 C) 98.2 F (36.8 C)  TempSrc: Oral Oral Oral Oral  SpO2: 98% 98% 98% 98%  Weight:   147 lb 0.8 oz (66.7 kg) 144 lb 6.4 oz (65.5 kg)  Height:        Intake/Output Summary (Last 24 hours) at 08/09/2017 0706 Last data filed at 08/09/2017 0606 Gross per 24 hour  Intake 164.4 ml  Output 250 ml  Net -85.6 ml   Filed Weights   08/08/17 1613 08/09/17 0450 08/09/17 0508  Weight: 174 lb (78.9 kg) 147 lb 0.8 oz (66.7 kg) 144 lb 6.4 oz (65.5 kg)    Telemetry    sinus - Personally Reviewed  ECG     No am EKG- Personally Reviewed  Physical Exam   GEN: No acute distress.   Neck: No JVD Cardiac: RRR, no murmurs, rubs, or gallops.  Respiratory: Clear to auscultation bilaterally. GI: Soft, nontender, non-distended  MS: No edema; No deformity. Neuro:  Nonfocal  Psych: Normal affect   Labs    Chemistry Recent Labs  Lab 08/08/17 1059 08/09/17 0358  NA 139 138  K 4.0 4.7  CL 101 103  CO2 27 25  GLUCOSE 160* 146*  BUN 26* 30*  CREATININE 1.26* 1.38*  CALCIUM 9.4 9.0  GFRNONAA 50* 45*  GFRAA 58* 52*  ANIONGAP 11 10     Hematology Recent Labs  Lab 08/08/17 1059 08/09/17 0358  WBC 13.2* 12.5*  RBC 5.47 5.14  HGB 16.1 14.7  HCT 47.9 45.6    MCV 87.6 88.7  MCH 29.4 28.6  MCHC 33.6 32.2  RDW 13.4 13.4  PLT 211 223    Cardiac Enzymes Recent Labs  Lab 08/08/17 1633 08/08/17 2206 08/09/17 0358  TROPONINI 1.63* 6.54* 20.28*    Recent Labs  Lab 08/08/17 1116  TROPIPOC 0.05     BNPNo results for input(s): BNP, PROBNP in the last 168 hours.   DDimer No results for input(s): DDIMER in the last 168 hours.   Radiology    Dg Chest 2 View  Result Date: 08/08/2017 CLINICAL DATA:  84 year old male with discomfort in chest and upper abdomen while eating breakfast this morning. EXAM: CHEST  2 VIEW COMPARISON:  No priors. FINDINGS: Lung volumes are normal. No consolidative airspace disease. No pleural effusions. No pneumothorax. No pulmonary nodule or mass noted. Pulmonary vasculature and the cardiomediastinal silhouette are within normal limits. Aortic atherosclerosis. IMPRESSION: 1. No radiographic evidence of acute cardiopulmonary disease. 2. Aortic atherosclerosis. Electronically Signed   By: Vinnie Langton M.D.   On: 08/08/2017 12:38    Cardiac Studies     Patient Profile     84 y.o. male with  history of CAD, HTN admitted 08/08/17 with epigastric pain, new onset atrial fibrillation. Troponin up to 20.2 this am.   Assessment & Plan    1. CAD/NSTEMI: Plan cardiac cath this am. Continue ASA/Plavix/beta blocker/statin. Risks and benefits of cath reviewed with pt.   2. Atrial fibrillation: Back in sinus. Will need DOAC at time of discharge. If he needs a stent today, will continue ASA and Plavix for one month with DOAC and then stop ASA in one month.   For questions or updates, please contact [place] Please consult [email or web address] for contact info under Cardiology/STEMI.      Signed, Lauree Chandler, MD  08/09/2017, 7:06 AM

## 2017-08-09 NOTE — Brief Op Note (Signed)
Brief Cardiac Catheterization Note  DATE: 08/09/17 TIME: 11:04 AM  PATIENT:  [name]  84 y.o. male  PRE-OPERATIVE DIAGNOSIS:  unstable angina  POST-OPERATIVE DIAGNOSIS:  * No post-op diagnosis entered *  PROCEDURE:  Procedure(s): LEFT HEART CATH AND CORONARY ANGIOGRAPHY (N/A) CORONARY STENT INTERVENTION (N/A)  SURGEON:  Surgeon(s) and Role:    * Lamesha Tibbits, Harrell Gave, MD - Primary  FINDINGS: 1.  Severe 2 vessel coronary artery disease with 50-70% ostial/proximal LCx disease followed by multifocal in-stent restenosis up to 99%, as well as 90% mid LAD in-stent restenosis. 2.  Upper normal LVEDP. 3.  Successful PCI to mid LAD with placement of Resolute Onyx 2.75 x 34 mm DES with 0% residual stenosis and TIMI-3 flow. 4.  Successful PCI of LCx with ostial/proximal Resolute Onyx 2.5 x 18 mm DES (overlaps old LCx stent) and Resolute Onyx 2.0 x 30 mm DES covering distal portion of old stent and disease distal to old stent. Final image shows 20% residual ISR in mid LCx with TIMI-3 flow.  RECOMMENDATIONS: 1.  DAPT with ASA and clopidogrel. Given a-fib on admission, patient may need anticoagulation after discharge. If anticoagulation is planned, aspirin could be discontinued after 1 month with continuation of clopidogrel and anticoagulant. 2.  Obtain echo to evaluate LVEF. 3.  Gentle hydration, given CKD.  Nelva Bush, MD [facility] Pager: [phone]

## 2017-08-09 NOTE — Progress Notes (Signed)
Progress Note  Patient Name: [name] Date of Encounter: 08/09/2017  Primary Cardiologist: Angelena Form  Subjective   Still having abdominal pain this am. No chest pain or SOB  Inpatient Medications    Scheduled Meds: . aspirin EC  81 mg Oral Daily  . clopidogrel  75 mg Oral Daily  . metoprolol succinate  25 mg Oral Daily  . simvastatin  40 mg Oral Daily   Continuous Infusions: . heparin 1,200 Units/hr (08/09/17 0606)   PRN Meds: acetaminophen, nitroGLYCERIN, ondansetron (ZOFRAN) IV   Vital Signs    Vitals:   08/08/17 1913 08/08/17 2353 08/09/17 0450 08/09/17 0508  BP: 133/62 (!) 149/80 127/70 124/64  Pulse: 69 68 68 (!) 54  Resp: 18 18 18 18   Temp: (!) 97.5 F (36.4 C) 98.5 F (36.9 C) (!) 97.4 F (36.3 C) 98.2 F (36.8 C)  TempSrc: Oral Oral Oral Oral  SpO2: 98% 98% 98% 98%  Weight:   147 lb 0.8 oz (66.7 kg) 144 lb 6.4 oz (65.5 kg)  Height:        Intake/Output Summary (Last 24 hours) at 08/09/2017 0706 Last data filed at 08/09/2017 0606 Gross per 24 hour  Intake 164.4 ml  Output 250 ml  Net -85.6 ml   Filed Weights   08/08/17 1613 08/09/17 0450 08/09/17 0508  Weight: 174 lb (78.9 kg) 147 lb 0.8 oz (66.7 kg) 144 lb 6.4 oz (65.5 kg)    Telemetry    sinus - Personally Reviewed  ECG     No am EKG- Personally Reviewed  Physical Exam   GEN: No acute distress.   Neck: No JVD Cardiac: RRR, no murmurs, rubs, or gallops.  Respiratory: Clear to auscultation bilaterally. GI: Soft, nontender, non-distended  MS: No edema; No deformity. Neuro:  Nonfocal  Psych: Normal affect   Labs    Chemistry Recent Labs  Lab 08/08/17 1059 08/09/17 0358  NA 139 138  K 4.0 4.7  CL 101 103  CO2 27 25  GLUCOSE 160* 146*  BUN 26* 30*  CREATININE 1.26* 1.38*  CALCIUM 9.4 9.0  GFRNONAA 50* 45*  GFRAA 58* 52*  ANIONGAP 11 10     Hematology Recent Labs  Lab 08/08/17 1059 08/09/17 0358  WBC 13.2* 12.5*  RBC 5.47 5.14  HGB 16.1 14.7  HCT 47.9 45.6    MCV 87.6 88.7  MCH 29.4 28.6  MCHC 33.6 32.2  RDW 13.4 13.4  PLT 211 223    Cardiac Enzymes Recent Labs  Lab 08/08/17 1633 08/08/17 2206 08/09/17 0358  TROPONINI 1.63* 6.54* 20.28*    Recent Labs  Lab 08/08/17 1116  TROPIPOC 0.05     BNPNo results for input(s): BNP, PROBNP in the last 168 hours.   DDimer No results for input(s): DDIMER in the last 168 hours.   Radiology    Dg Chest 2 View  Result Date: 08/08/2017 CLINICAL DATA:  84 year old male with discomfort in chest and upper abdomen while eating breakfast this morning. EXAM: CHEST  2 VIEW COMPARISON:  No priors. FINDINGS: Lung volumes are normal. No consolidative airspace disease. No pleural effusions. No pneumothorax. No pulmonary nodule or mass noted. Pulmonary vasculature and the cardiomediastinal silhouette are within normal limits. Aortic atherosclerosis. IMPRESSION: 1. No radiographic evidence of acute cardiopulmonary disease. 2. Aortic atherosclerosis. Electronically Signed   By: Vinnie Langton M.D.   On: 08/08/2017 12:38    Cardiac Studies     Patient Profile     84 y.o. male with  history of CAD, HTN admitted 08/08/17 with epigastric pain, new onset atrial fibrillation. Troponin up to 20.2 this am.   Assessment & Plan    1. CAD/NSTEMI: Plan cardiac cath this am. Continue ASA/Plavix/beta blocker/statin. Risks and benefits of cath reviewed with pt.   2. Atrial fibrillation: Back in sinus. Will need DOAC at time of discharge. If he needs a stent today, will continue ASA and Plavix for one month with DOAC and then stop ASA in one month.   For questions or updates, please contact [place] Please consult [email or web address] for contact info under Cardiology/STEMI.      Signed, Lauree Chandler, MD  08/09/2017, 7:06 AM

## 2017-08-10 ENCOUNTER — Encounter (HOSPITAL_COMMUNITY): Payer: Self-pay | Admitting: Internal Medicine

## 2017-08-10 ENCOUNTER — Inpatient Hospital Stay (HOSPITAL_COMMUNITY): Payer: Medicare Other

## 2017-08-10 DIAGNOSIS — I214 Non-ST elevation (NSTEMI) myocardial infarction: Principal | ICD-10-CM

## 2017-08-10 DIAGNOSIS — I480 Paroxysmal atrial fibrillation: Secondary | ICD-10-CM

## 2017-08-10 HISTORY — DX: Non-ST elevation (NSTEMI) myocardial infarction: I21.4

## 2017-08-10 LAB — CBC
HEMATOCRIT: 41.3 % (ref 39.0–52.0)
HEMOGLOBIN: 13.4 g/dL (ref 13.0–17.0)
MCH: 29.3 pg (ref 26.0–34.0)
MCHC: 32.4 g/dL (ref 30.0–36.0)
MCV: 90.2 fL (ref 78.0–100.0)
PLATELETS: 180 10*3/uL (ref 150–400)
RBC: 4.58 MIL/uL (ref 4.22–5.81)
RDW: 14 % (ref 11.5–15.5)
WBC: 10.5 10*3/uL (ref 4.0–10.5)

## 2017-08-10 LAB — BASIC METABOLIC PANEL
Anion gap: 10 (ref 5–15)
BUN: 29 mg/dL — ABNORMAL HIGH (ref 6–20)
CHLORIDE: 104 mmol/L (ref 101–111)
CO2: 23 mmol/L (ref 22–32)
CREATININE: 1.44 mg/dL — AB (ref 0.61–1.24)
Calcium: 8.5 mg/dL — ABNORMAL LOW (ref 8.9–10.3)
GFR calc Af Amer: 50 mL/min — ABNORMAL LOW (ref >60)
GFR calc non Af Amer: 43 mL/min — ABNORMAL LOW (ref >60)
Glucose, Bld: 104 mg/dL — ABNORMAL HIGH (ref 65–99)
Potassium: 4.2 mmol/L (ref 3.5–5.1)
Sodium: 137 mmol/L (ref 135–145)

## 2017-08-10 LAB — HEPARIN LEVEL (UNFRACTIONATED): Heparin Unfractionated: 0.3 IU/mL (ref 0.30–0.70)

## 2017-08-10 LAB — ECHOCARDIOGRAM COMPLETE
Height: 71 in
WEIGHTICAEL: 2768 [oz_av]

## 2017-08-10 MED ORDER — ATORVASTATIN CALCIUM 40 MG PO TABS
40.0000 mg | ORAL_TABLET | Freq: Every day | ORAL | Status: DC
  Administered 2017-08-10 – 2017-08-12 (×3): 40 mg via ORAL
  Filled 2017-08-10 (×3): qty 1

## 2017-08-10 MED ORDER — METOPROLOL SUCCINATE ER 50 MG PO TB24
50.0000 mg | ORAL_TABLET | Freq: Every day | ORAL | Status: DC
  Administered 2017-08-11: 50 mg via ORAL
  Filled 2017-08-10: qty 1

## 2017-08-10 MED ORDER — APIXABAN 5 MG PO TABS
5.0000 mg | ORAL_TABLET | Freq: Two times a day (BID) | ORAL | Status: DC
  Administered 2017-08-10: 5 mg via ORAL
  Filled 2017-08-10: qty 1

## 2017-08-10 MED ORDER — APIXABAN 5 MG PO TABS: 5.0000 mg | ORAL_TABLET | Freq: Two times a day (BID) | ORAL | 0 refills | Status: DC

## 2017-08-10 MED ORDER — WARFARIN - PHARMACIST DOSING INPATIENT: Freq: Every day | Status: DC

## 2017-08-10 MED ORDER — METOPROLOL SUCCINATE ER 25 MG PO TB24
25.0000 mg | ORAL_TABLET | Freq: Once | ORAL | Status: AC
  Administered 2017-08-10: 10:00:00 25 mg via ORAL

## 2017-08-10 MED ORDER — WARFARIN SODIUM 2.5 MG PO TABS
2.5000 mg | ORAL_TABLET | Freq: Once | ORAL | Status: AC
  Administered 2017-08-10: 2.5 mg via ORAL
  Filled 2017-08-10: qty 1

## 2017-08-10 NOTE — Progress Notes (Addendum)
Progress Note  Patient Name: [name] Date of Encounter: 08/10/2017  Primary Cardiologist: Angelena Form  Subjective   No chest pain, and abd pain is improved.   Inpatient Medications    Scheduled Meds: . aspirin  81 mg Oral Daily  . clopidogrel  75 mg Oral Daily  . metoprolol succinate  25 mg Oral Daily  . simvastatin  40 mg Oral Daily  . sodium chloride flush  3 mL Intravenous Q12H   Continuous Infusions: . sodium chloride    . heparin 1,150 Units/hr (08/10/17 0600)   PRN Meds: sodium chloride, acetaminophen, nitroGLYCERIN, ondansetron (ZOFRAN) IV, sodium chloride flush   Vital Signs    Vitals:   08/09/17 2000 08/10/17 0132 08/10/17 0337 08/10/17 0759  BP:  (!) 114/55 110/77 (!) 113/56  Pulse: 65 62 86 90  Resp: 19 (!) 25 (!) 22 (!) 22  Temp:  98 F (36.7 C)  98.1 F (36.7 C)  TempSrc:  Oral  Oral  SpO2: 96% 95% 93% 94%  Weight:  173 lb (78.5 kg)    Height:        Intake/Output Summary (Last 24 hours) at 08/10/2017 0827 Last data filed at 08/10/2017 0802 Gross per 24 hour  Intake 1005.9 ml  Output 650 ml  Net 355.9 ml   Filed Weights   08/09/17 0450 08/09/17 0508 08/10/17 0132  Weight: 147 lb 0.8 oz (66.7 kg) 144 lb 6.4 oz (65.5 kg) 173 lb (78.5 kg)    Telemetry    Afib rates 90-120s - Personally Reviewed  ECG    Afib - Personally Reviewed  Physical Exam   General: Well developed, well nourished, male appearing in no acute distress. Head: Normocephalic, atraumatic.  Neck: Supple without bruits, JVD. Lungs:  Resp regular and unlabored, CTA. Heart: Irreg Irreg, S1, S2, no S3, S4, or murmur; no rub. Abdomen: Soft, non-tender, non-distended with normoactive bowel sounds.  Extremities: No clubbing, cyanosis, edema. Distal pedal pulses are 2+ bilaterally. Right radial cath site stable.  Neuro: Alert and oriented X 3. Moves all extremities spontaneously. Psych: Normal affect.  Labs    Chemistry Recent Labs  Lab 08/08/17 1059 08/09/17 0358  08/10/17 0503  NA 139 138 137  K 4.0 4.7 4.2  CL 101 103 104  CO2 27 25 23   GLUCOSE 160* 146* 104*  BUN 26* 30* 29*  CREATININE 1.26* 1.38* 1.44*  CALCIUM 9.4 9.0 8.5*  GFRNONAA 50* 45* 43*  GFRAA 58* 52* 50*  ANIONGAP 11 10 10      Hematology Recent Labs  Lab 08/08/17 1059 08/09/17 0358 08/10/17 0503  WBC 13.2* 12.5* 10.5  RBC 5.47 5.14 4.58  HGB 16.1 14.7 13.4  HCT 47.9 45.6 41.3  MCV 87.6 88.7 90.2  MCH 29.4 28.6 29.3  MCHC 33.6 32.2 32.4  RDW 13.4 13.4 14.0  PLT 211 223 180    Cardiac Enzymes Recent Labs  Lab 08/08/17 1633 08/08/17 2206 08/09/17 0358  TROPONINI 1.63* 6.54* 20.28*    Recent Labs  Lab 08/08/17 1116  TROPIPOC 0.05     BNPNo results for input(s): BNP, PROBNP in the last 168 hours.   DDimer No results for input(s): DDIMER in the last 168 hours.    Radiology    Dg Chest 2 View  Result Date: 08/08/2017 CLINICAL DATA:  84 year old male with discomfort in chest and upper abdomen while eating breakfast this morning. EXAM: CHEST  2 VIEW COMPARISON:  No priors. FINDINGS: Lung volumes are normal. No consolidative airspace disease.  No pleural effusions. No pneumothorax. No pulmonary nodule or mass noted. Pulmonary vasculature and the cardiomediastinal silhouette are within normal limits. Aortic atherosclerosis. IMPRESSION: 1. No radiographic evidence of acute cardiopulmonary disease. 2. Aortic atherosclerosis. Electronically Signed   By: Vinnie Langton M.D.   On: 08/08/2017 12:38    Cardiac Studies   Cath: 08/09/17  Conclusion   Conclusions: 1. Severe 2-vessel coronary artery disease, including 80% in-stent restenosis of the mid LAD with disease extending beyond the distal stent edge, as well as 70% ostial/proximal LCx disease followed by multifocal mid and distal LCx in-stent restenosis of up to 99%. 2. Moderate, non-obstructive RCA disease. 3. Normal left ventricular filling pressure. 4. Successful PCI to mid LAD with placement of Resolute  Onyx 2.75 x 34 mm drug eluting stent (post-dilated to 3.1 mm) with 0% residual stenosis and TIMI-3 flow. 5. Successful PCI to LCx with placement of Resolute Onyx drug-eluting stents extending from the ostium to the proximal segment of old stent (2.5 x 18 mm) and covering the distal 1/3 of old stent extending into the distal LCx (2.0 x 30 mm) with 0% residual stenosis and TIMI-3 flow.  Recommendations: 1. Dual antiplatelet therapy with ASA and clopidogrel. Given a-fib on admission, patient may need anticoagulation after discharge. If anticoagulation is planned, aspirin could be discontinued after 1 month with continuation of clopidogrel and anticoagulant. 2. Obtain echo to evaluate LVEF. 3. Gentle hydration, given CKD.     Patient Profile     84 y.o. male with history of CAD, HTN admitted 08/08/17 with epigastric pain, new onset atrial fibrillation. Noted NSTEMI and underwent cardiac cath.   Assessment & Plan    1. NSTEMI: Underwent cath noted above with PCI/DES x2 to Lcx and x1 to mLAD. Will plan for ASA/plavix given the need for DOAC. No chest pain overnight, but still with some mild abd discomfort.  -- echo pending -- ASA, statin, BB, plavix  2. PAF: Converted back to Afib earlier this morning. Rates generally controlled but elevated rates. Will have him walk with cardiac rehab and monitor HR.  -- on Toprol XL 25mg , has hx of bradycardia when in SR.  -- stop IV heparin and start Eliquis 5mg  BID today.   3. Chonic kidney disease, stage 2:  Cr bumped to 1.44 post cath. -- follow up BMET in the am  4. HL: on Zocor 40mg . LDL above goal 104. Will switch to Lipitor 40mg     Signed, Reino Bellis, NP  08/10/2017, 8:27 AM  Pager # [phone]   For questions or updates, please contact [place] Please consult [email or web address] for contact info under Cardiology/STEMI.  I have personally seen and examined this patient with Reino Bellis, NP. I agree with the assessment and plan as  outlined above.  Admitted with NSTEMI and found to have progression of CAD with severe stenosis in mid LAD and severe stenosis in the Circumflex/OM. No chest pain or abdominal pain this am. Will plan to continue ASA and Plavix for one month then will stop ASA.  He is back in atrial fib. Rate 100s in bed and 120-130s when walking. Will increase Toprol to 50 mg daily. If he goes back into sinus and is too slow, will have to consider pacemaker. Will start Eliquis today.  Will monitor on tele today.   Lauree Chandler 08/10/2017 9:27 AM

## 2017-08-10 NOTE — Progress Notes (Addendum)
ANTICOAGULATION CONSULT NOTE - Initial Consult  Pharmacy Consult for Warfarin Indication: Afib  Allergies  Allergen Reactions  . Gadolinium Derivatives Hives and Itching    Dr. Jeralyn Ruths s/w Mr. Lincoln. We observed him for 15 minutes. He did not need to take Benadryl. The symptoms began to subside before he left.     Patient Measurements: Height: 5\' 11"  (180.3 cm) Weight: 173 lb (78.5 kg) IBW/kg (Calculated) : 75.3   Vital Signs: Temp: 98.5 F (36.9 C) (02/01 1500) Temp Source: Oral (02/01 1500) BP: 108/51 (02/01 1500) Pulse Rate: 93 (02/01 1500)  Labs: Recent Labs    08/08/17 1059 08/08/17 1633 08/08/17 2206 08/08/17 2349 08/09/17 0358 08/10/17 0503 08/10/17 0820  HGB 16.1  --   --   --  14.7 13.4  --   HCT 47.9  --   --   --  45.6 41.3  --   PLT 211  --   --   --  223 180  --   LABPROT  --   --   --   --  14.1  --   --   INR  --   --   --   --  1.10  --   --   HEPARINUNFRC  --   --   --  0.55 0.61  --  0.30  CREATININE 1.26*  --   --   --  1.38* 1.44*  --   TROPONINI  --  1.63* 6.54*  --  20.28*  --   --     Estimated Creatinine Clearance: 39.9 mL/min (A) (by C-G formula based on SCr of 1.44 mg/dL (H)).   Medical History: Past Medical History:  Diagnosis Date  . Arthritis   . Chest pain, unspecified   . Coronary atherosclerosis of unspecified type of vessel, native or graft   . Dizziness and giddiness   . Hypertension   . Myocardial infarction ([place])   . PVD (peripheral vascular disease) ([place])   . Shortness of breath    exertion      Assessment: 87yom s/p PCI now on asa81mg  and clopidogrel.  New Afib this admission plan to start anticoagulation.  S/p apixaban 5mg  x1 this am but unabe to afford as outpatient will change to warfarin. H/h stable, no bleeding noted  Goal of Therapy:  INR 2-3 Monitor platelets by anticoagulation protocol: Yes   Plan:  Warfarin 2.5mg  po x1 tonight  Daily INR  Bonnita Nasuti Pharm.D. CPP, BCPS Clinical  Pharmacist [phone] 08/10/2017 5:56 PM

## 2017-08-10 NOTE — Progress Notes (Signed)
  Echocardiogram 2D Echocardiogram has been performed.  [name] 08/10/2017, 10:10 AM

## 2017-08-10 NOTE — Progress Notes (Signed)
CARDIAC REHAB PHASE I   PRE:  Rate/Rhythm: 89 afib  BP:  Supine: 113/56  Sitting:   Standing:    SaO2: 92%RA  MODE:  Ambulation: 425 ft   POST:  Rate/Rhythm: 139 afib  With activity       Back down with rest.  BP:  Supine: 108/55  Sitting:   Standing:    SaO2: 95%RA 9574-7340 Pt has cane at home but seldomly uses. Walked pt with rolling walker and monitored heart rate whole walk. Got to 139 atrial fib. Pt denied CP or palpitations. Walked 425 ft with rolling walker with steady gait. Held to his arm to walk to bathroom. Completed MI education with pt who voiced understanding. Encouraged him to read materials left as he had a little problem with teach back. Reviewed importance of plavix with stents, NTG use, MI restrictions, ex ed and heart healthy diet given. Gave booklet on atrial fib and discussed reasoning for anticoagulation. Cautioned pt to be careful walking due to plavix and eliquis. May need to use cane even though he does not like to. Discussed CRP 2 and will refer to [place].   Graylon Good, RN BSN  08/10/2017 9:16 AM

## 2017-08-10 NOTE — Progress Notes (Signed)
Patient with no medication coverage, eliquis is 538.00 for 30 day supply.

## 2017-08-10 NOTE — Progress Notes (Signed)
    Called by CM that patient does not have medication coverage with his Medicare. Eliquis/Xarelto would be over $500/month. Discussed with MD and will plan to switch to coumadin.   SignedReino Bellis, NP-C 08/10/2017, 4:35 PM Pager: [phone]

## 2017-08-10 NOTE — Discharge Instructions (Addendum)

## 2017-08-10 NOTE — Care Management Note (Addendum)
Case Management Note  Patient Details  Name: [name] MRN: [id number] Date of Birth: February 18, 1930  Subjective/Objective:    From home, pta indep, s/p coronary stent intervention , will be on plavix.  He will also be on eliquis which will be new for him.  Patient does not have medication coverage, he uses a visa to pay for his medications. Ria Comment PA will send script to patient's pharmacy so we can see how much it will cost him for the eliquis.  NCM gave patient the 30 day free savings card for  Eliquis. Eiliquis was 538.00 , Ria Comment PA notified.  He will need a walker to go home with.             Action/Plan: NCM will follow for dc needs.   Expected Discharge Date:                  Expected Discharge Plan:  Home/Self Care  In-House Referral:     Discharge planning Services  CM Consult, Medication Assistance  Post Acute Care Choice:    Choice offered to:     DME Arranged:    DME Agency:     HH Arranged:    HH Agency:     Status of Service:  Completed, signed off  If discussed at [employer] of Stay Meetings, dates discussed:    Additional Comments:  Zenon Mayo, RN 08/10/2017, 3:19 PM

## 2017-08-10 NOTE — Progress Notes (Signed)
Pt self converted from NSR to A. Fib @ 3:14 am with HR 90's-120's; BP=110/77; asymptomatic. Rhythm confirmed EKG. Pt already on Heparin gtt. Dr Alveta Heimlich informed. Currently pt HR=70's-90's. Will continue to monitor pt.

## 2017-08-11 DIAGNOSIS — E782 Mixed hyperlipidemia: Secondary | ICD-10-CM

## 2017-08-11 DIAGNOSIS — N182 Chronic kidney disease, stage 2 (mild): Secondary | ICD-10-CM

## 2017-08-11 DIAGNOSIS — I5021 Acute systolic (congestive) heart failure: Secondary | ICD-10-CM

## 2017-08-11 LAB — BASIC METABOLIC PANEL
Anion gap: 10 (ref 5–15)
BUN: 28 mg/dL — AB (ref 6–20)
CHLORIDE: 102 mmol/L (ref 101–111)
CO2: 25 mmol/L (ref 22–32)
CREATININE: 1.54 mg/dL — AB (ref 0.61–1.24)
Calcium: 8.7 mg/dL — ABNORMAL LOW (ref 8.9–10.3)
GFR calc Af Amer: 46 mL/min — ABNORMAL LOW (ref >60)
GFR calc non Af Amer: 39 mL/min — ABNORMAL LOW (ref >60)
Glucose, Bld: 175 mg/dL — ABNORMAL HIGH (ref 65–99)
POTASSIUM: 4 mmol/L (ref 3.5–5.1)
Sodium: 137 mmol/L (ref 135–145)

## 2017-08-11 LAB — PROTIME-INR
INR: 1.16
Prothrombin Time: 14.7 seconds (ref 11.4–15.2)

## 2017-08-11 MED ORDER — WARFARIN SODIUM 2.5 MG PO TABS
2.5000 mg | ORAL_TABLET | Freq: Once | ORAL | Status: AC
  Administered 2017-08-11: 2.5 mg via ORAL
  Filled 2017-08-11: qty 1

## 2017-08-11 MED ORDER — FUROSEMIDE 10 MG/ML IJ SOLN
20.0000 mg | Freq: Once | INTRAMUSCULAR | Status: AC
  Administered 2017-08-11: 20 mg via INTRAVENOUS
  Filled 2017-08-11: qty 2

## 2017-08-11 NOTE — Progress Notes (Signed)
Report called and given to Santiago Glad RN at this time. Patient is eating his breakfast, will transfer via wheelchair and call his daughter to make aware.

## 2017-08-11 NOTE — Progress Notes (Signed)
ANTICOAGULATION CONSULT NOTE - Initial Consult  Pharmacy Consult for Warfarin Indication: Afib  Allergies  Allergen Reactions  . Gadolinium Derivatives Hives and Itching    Dr. Jeralyn Ruths s/w [name]. We observed him for 15 minutes. He did not need to take Benadryl. The symptoms began to subside before he left.     Patient Measurements: Height: 5\' 11"  (180.3 cm) Weight: 175 lb 4.3 oz (79.5 kg) IBW/kg (Calculated) : 75.3   Vital Signs: Temp: 97.8 F (36.6 C) (02/02 0930) Temp Source: Oral (02/02 0930) BP: 115/54 (02/02 0930) Pulse Rate: 83 (02/02 0930)  Labs: Recent Labs    08/08/17 1059 08/08/17 1633 08/08/17 2206 08/08/17 2349 08/09/17 0358 08/10/17 0503 08/10/17 0820 08/11/17 0308 08/11/17 0926  HGB 16.1  --   --   --  14.7 13.4  --   --   --   HCT 47.9  --   --   --  45.6 41.3  --   --   --   PLT 211  --   --   --  223 180  --   --   --   LABPROT  --   --   --   --  14.1  --   --  14.7  --   INR  --   --   --   --  1.10  --   --  1.16  --   HEPARINUNFRC  --   --   --  0.55 0.61  --  0.30  --   --   CREATININE 1.26*  --   --   --  1.38* 1.44*  --   --  1.54*  TROPONINI  --  1.63* 6.54*  --  20.28*  --   --   --   --     Estimated Creatinine Clearance: 37.4 mL/min (A) (by C-G formula based on SCr of 1.54 mg/dL (H)).   Medical History: Past Medical History:  Diagnosis Date  . Arthritis   . Chest pain, unspecified   . Coronary atherosclerosis of unspecified type of vessel, native or graft   . Dizziness and giddiness   . Hypertension   . Myocardial infarction ([place])   . PVD (peripheral vascular disease) ([place])   . Shortness of breath    exertion      Assessment: 87yom s/p PCI now on asa81mg  and clopidogrel.  New Afib this admission plan to start anticoagulation.  S/p apixaban 5mg  x1 this am but unabe to afford as outpatient, changed to warfarin. Per MD, no need to bridge with heparin while on DAPT. CBC stable with no signs/sx of bleeding. Variable food  intake. DDI noted with atorvastatin.  INR subtherapeutic, 1.16.  Goal of Therapy:  INR 2-3 Monitor platelets by anticoagulation protocol: Yes   Plan:  Warfarin 2.5 mg PO x 1 Daily INR, CBCs F/u s/sx bleeding, PO intake, DDI   Nida Boatman, PharmD PGY1 Acute Care Pharmacy Resident Pager: [phone] 08/11/2017 10:33 AM

## 2017-08-11 NOTE — Progress Notes (Signed)
CARDIAC REHAB PHASE I   PRE:  Rate/Rhythm: 79 afib  BP:  Supine: 113/45  Sitting:   Standing:    SaO2: 94%RA  MODE:  Ambulation: 400 ft   POST:  Rate/Rhythm: 114 afib  BP:  Supine:   Sitting: 81/58, 117/57  Standing:    SaO2: 98%RA 0835-0900 Pt walked 400 ft with rolling walker with steady gait. Asked pt if he wanted to try without walker and he stated he felt steadier with it. Has cane at home. Would recommend rolling walker for home use if pt still requiring to use it to walk at discharge. Encouraged pt to walk with staff this weekend. Heart rate to 114. Put in wheelchair after walk for transfer. Pt cut walk short as he needed to use bathroom.   Graylon Good, RN BSN  08/11/2017 9:15 AM

## 2017-08-11 NOTE — Progress Notes (Addendum)
Progress Note  Patient Name: [name] Date of Encounter: 08/11/2017  Primary Cardiologist: Dr. Angelena Form  Subjective   Overall feels "about the same as yesterday". Has occasional mild chest discomfort. Has some sporadic shortness of breath. Some right posterior shoulder "twinges".  Inpatient Medications    Scheduled Meds: . aspirin  81 mg Oral Daily  . atorvastatin  40 mg Oral q1800  . clopidogrel  75 mg Oral Daily  . metoprolol succinate  50 mg Oral Daily  . sodium chloride flush  3 mL Intravenous Q12H  . Warfarin - Pharmacist Dosing Inpatient   Does not apply q1800   Continuous Infusions: . sodium chloride     PRN Meds: sodium chloride, acetaminophen, nitroGLYCERIN, ondansetron (ZOFRAN) IV, sodium chloride flush   Vital Signs    Vitals:   08/10/17 1500 08/10/17 2010 08/11/17 0420 08/11/17 0701  BP: (!) 108/51 (!) 96/54 (!) 101/56 (!) 94/56  Pulse: 93     Resp: 18 19 (!) 29 16  Temp: 98.5 F (36.9 C) (!) 97.5 F (36.4 C) 98.2 F (36.8 C) 97.7 F (36.5 C)  TempSrc: Oral Oral Oral Oral  SpO2: 93% 92% 92% 93%  Weight:   175 lb 4.3 oz (79.5 kg)   Height:        Intake/Output Summary (Last 24 hours) at 08/11/2017 0815 Last data filed at 08/11/2017 0000 Gross per 24 hour  Intake 650 ml  Output 200 ml  Net 450 ml   Filed Weights   08/09/17 0508 08/10/17 0132 08/11/17 0420  Weight: 144 lb 6.4 oz (65.5 kg) 173 lb (78.5 kg) 175 lb 4.3 oz (79.5 kg)    Telemetry    A fib (70-80 bpm range currently), tachycardic last night, 2 second pause at 23:11 - Personally Reviewed  ECG    n/a - Personally Reviewed  Physical Exam   GEN: No acute distress.   Neck: No JVD Cardiac: Irregular rhythm, normal rate, no murmurs, rubs, or gallops.  Respiratory: Diminished at bases, bibasilar crackles. GI: Soft, nontender, non-distended  MS: No edema; No deformity. Neuro:  Nonfocal  Psych: Normal affect   Labs    Chemistry Recent Labs  Lab 08/08/17 1059  08/09/17 0358 08/10/17 0503  NA 139 138 137  K 4.0 4.7 4.2  CL 101 103 104  CO2 27 25 23   GLUCOSE 160* 146* 104*  BUN 26* 30* 29*  CREATININE 1.26* 1.38* 1.44*  CALCIUM 9.4 9.0 8.5*  GFRNONAA 50* 45* 43*  GFRAA 58* 52* 50*  ANIONGAP 11 10 10      Hematology Recent Labs  Lab 08/08/17 1059 08/09/17 0358 08/10/17 0503  WBC 13.2* 12.5* 10.5  RBC 5.47 5.14 4.58  HGB 16.1 14.7 13.4  HCT 47.9 45.6 41.3  MCV 87.6 88.7 90.2  MCH 29.4 28.6 29.3  MCHC 33.6 32.2 32.4  RDW 13.4 13.4 14.0  PLT 211 223 180    Cardiac Enzymes Recent Labs  Lab 08/08/17 1633 08/08/17 2206 08/09/17 0358  TROPONINI 1.63* 6.54* 20.28*    Recent Labs  Lab 08/08/17 1116  TROPIPOC 0.05     BNPNo results for input(s): BNP, PROBNP in the last 168 hours.   DDimer No results for input(s): DDIMER in the last 168 hours.   Radiology    No results found.  Cardiac Studies   Cath: 08/09/17  Conclusion   Conclusions: 1. Severe 2-vessel coronary artery disease, including 80% in-stent restenosis of the mid LAD with disease extending beyond the distal stent edge,  as well as 70% ostial/proximal LCx disease followed by multifocal mid and distal LCx in-stent restenosis of up to 99%. 2. Moderate, non-obstructive RCA disease. 3. Normal left ventricular filling pressure. 4. Successful PCI to mid LAD with placement of Resolute Onyx 2.75 x 34 mm drug eluting stent (post-dilated to 3.1 mm) with 0% residual stenosis and TIMI-3 flow. 5. Successful PCI to LCx with placement of Resolute Onyx drug-eluting stents extending from the ostium to the proximal segment of old stent (2.5 x 18 mm) and covering the distal 1/3 of old stent extending into the distal LCx (2.0 x 30 mm) with 0% residual stenosis and TIMI-3 flow.  Recommendations: 1. Dual antiplatelet therapy with ASA and clopidogrel. Given a-fib on admission, patient may need anticoagulation after discharge. If anticoagulation is planned, aspirin could be  discontinued after 1 month with continuation of clopidogrel and anticoagulant. 2. Obtain echo to evaluate LVEF. 3. Gentle hydration, given CKD.    Echocardiogram (08/10/17):  - Left ventricle: The cavity size was normal. Wall thickness was   increased in a pattern of mild LVH. Systolic function was   moderately reduced. The estimated ejection fraction was in the   range of 35% to 40%. Global hypokinesis with regional variation.   The study is not technically sufficient to allow evaluation of LV   diastolic function. - Mitral valve: Mildly thickened leaflets . There was trivial   regurgitation. - Left atrium: The atrium was normal in size. - Inferior vena cava: The vessel was dilated. The respirophasic   diameter changes were blunted (< 50%), consistent with elevated   central venous pressure.  Impressions:  - LVEF 35-40%, mild LVH, global hypokinesis with regional   variation, trivial MR, normal LA size, dilated IVC.  Patient Profile     84 y.o. male with history of CAD and HTN, admitted 08/08/17 with epigastric pain and new onset atrial fibrillation. Noted NSTEMI and underwent cardiac cath.  Assessment & Plan    1. NSTEMI: Symptomatically stable. Underwent cath noted above with PCI/DES x2 to Lcx and x1 to mLAD. Will plan for ASA (likely x one month)/plavix (x 1 year) given the need for warfarin. LVEF moderately reduced as noted above, 35-40%. -- ASA, statin, BB, plavix. Unable to add ACEI/ARB due to soft BP.  2. PAF: Remains in rate controlled atrial fibrillation. Symptomatically stable. -- on Toprol XL 50 mg daily, has hx of bradycardia when in SR.  -- Could not afford Eliquis. Currently on IV heparin and warfarin. INR 1.16 today.  3. Chonic kidney disease, stage 2:  Cr bumped to 1.44 post cath. -- I have ordered a BMET for today and tomorrow.  4. HL: LDL above goal 104. Zocor switched to Lipitor 40mg    5. Acute systolic heart failure: Has some crackles on exam and  diminished breath sounds at bases. Does have some shortness of breath today. I will provide one dose of IV Lasix 20 mg and monitor renal function. On Toprol-XL 50 mg daily for HR control as well. Unable to add ACEI/ARB due to soft BP.    For questions or updates, please contact [place] Please consult [email or web address] for contact info under Cardiology/STEMI.      Signed, Kate Sable, MD  08/11/2017, 8:15 AM

## 2017-08-11 NOTE — Progress Notes (Signed)
Patient transferred to 6E16 at this time via wheelchair, daughter with patient at transfer.

## 2017-08-11 NOTE — Progress Notes (Signed)
Patient's daughter Ferne Reus updated on care and informed of transfer to another room.

## 2017-08-12 DIAGNOSIS — E7849 Other hyperlipidemia: Secondary | ICD-10-CM

## 2017-08-12 DIAGNOSIS — I493 Ventricular premature depolarization: Secondary | ICD-10-CM

## 2017-08-12 DIAGNOSIS — R002 Palpitations: Secondary | ICD-10-CM

## 2017-08-12 DIAGNOSIS — K5900 Constipation, unspecified: Secondary | ICD-10-CM

## 2017-08-12 DIAGNOSIS — I5022 Chronic systolic (congestive) heart failure: Secondary | ICD-10-CM

## 2017-08-12 LAB — BASIC METABOLIC PANEL
Anion gap: 10 (ref 5–15)
BUN: 31 mg/dL — ABNORMAL HIGH (ref 6–20)
CALCIUM: 8.5 mg/dL — AB (ref 8.9–10.3)
CO2: 26 mmol/L (ref 22–32)
Chloride: 102 mmol/L (ref 101–111)
Creatinine, Ser: 1.36 mg/dL — ABNORMAL HIGH (ref 0.61–1.24)
GFR, EST AFRICAN AMERICAN: 53 mL/min — AB (ref >60)
GFR, EST NON AFRICAN AMERICAN: 46 mL/min — AB (ref >60)
Glucose, Bld: 136 mg/dL — ABNORMAL HIGH (ref 65–99)
Potassium: 4.2 mmol/L (ref 3.5–5.1)
SODIUM: 138 mmol/L (ref 135–145)

## 2017-08-12 LAB — PROTIME-INR
INR: 1.07
PROTHROMBIN TIME: 13.8 s (ref 11.4–15.2)

## 2017-08-12 LAB — MAGNESIUM: MAGNESIUM: 2.2 mg/dL (ref 1.7–2.4)

## 2017-08-12 MED ORDER — WARFARIN SODIUM 5 MG PO TABS
5.0000 mg | ORAL_TABLET | Freq: Once | ORAL | Status: AC
  Administered 2017-08-12: 5 mg via ORAL
  Filled 2017-08-12: qty 1

## 2017-08-12 MED ORDER — BISACODYL 5 MG PO TBEC
5.0000 mg | DELAYED_RELEASE_TABLET | Freq: Once | ORAL | Status: AC
  Administered 2017-08-12: 5 mg via ORAL
  Filled 2017-08-12: qty 1

## 2017-08-12 MED ORDER — METOPROLOL SUCCINATE ER 50 MG PO TB24
75.0000 mg | ORAL_TABLET | Freq: Every day | ORAL | Status: DC
  Administered 2017-08-12 – 2017-08-13 (×2): 75 mg via ORAL
  Filled 2017-08-12 (×2): qty 1

## 2017-08-12 NOTE — Progress Notes (Signed)
ANTICOAGULATION CONSULT NOTE - Initial Consult  Pharmacy Consult for Warfarin Indication: Afib  Allergies  Allergen Reactions  . Gadolinium Derivatives Hives and Itching    Dr. Jeralyn Sandoval s/w [name]. We observed him for 15 minutes. He did not need to take Benadryl. The symptoms began to subside before he left.     Patient Measurements: Height: 5\' 11"  (180.3 cm) Weight: 175 lb 4.3 oz (79.5 kg) IBW/kg (Calculated) : 75.3   Vital Signs: Temp: 97.3 F (36.3 C) (02/03 0100) Temp Source: Oral (02/03 0100) BP: 118/71 (02/03 0100) Pulse Rate: 78 (02/03 0100)  Labs: Recent Labs    08/10/17 0503 08/10/17 0820 08/11/17 0308 08/11/17 0926 08/12/17 0624  HGB 13.4  --   --   --   --   HCT 41.3  --   --   --   --   PLT 180  --   --   --   --   LABPROT  --   --  14.7  --  13.8  INR  --   --  1.16  --  1.07  HEPARINUNFRC  --  0.30  --   --   --   CREATININE 1.44*  --   --  1.54* 1.36*    Estimated Creatinine Clearance: 42.3 mL/min (A) (by C-G formula based on SCr of 1.36 mg/dL (H)).   Medical History: Past Medical History:  Diagnosis Date  . Arthritis   . Chest pain, unspecified   . Coronary atherosclerosis of unspecified type of vessel, native or graft   . Dizziness and giddiness   . Hypertension   . Myocardial infarction ([place])   . PVD (peripheral vascular disease) ([place])   . Shortness of breath    exertion    Assessment: 87yom s/p PCI now on asa81mg  and clopidogrel.  New Afib this admission plan to start anticoagulation. Was on apixaban, but switched to warfarin for cost. Per MD, no need to bridge with heparin while on DAPT. CBC stable with no signs/sx of bleeding. Variable food intake. DDI noted with atorvastatin.  INR remains subtherapeutic, 1.07  Goal of Therapy:  INR 2-3 Monitor platelets by anticoagulation protocol: Yes   Plan:  Warfarin 5 mg PO x1 Daily INR, CBCs F/u s/sx bleeding, PO intake, DDI  [name], PharmD PGY1 Acute Care Pharmacy  Resident Pager: [phone] 08/12/2017 9:34 AM

## 2017-08-12 NOTE — Progress Notes (Signed)
Progress Note  Patient Name: [name] Date of Encounter: 08/12/2017  Primary Cardiologist: Dr. Angelena Form  Subjective   Has occasional "uncomfortable sensations and numbness" in his chest, denies pain per se. Denies shortness of breath. Biggest complaint is that he hasn't had a bowel movement for several days. Has prostate enlargement and takes him a long time to completely void.  Inpatient Medications    Scheduled Meds: . aspirin  81 mg Oral Daily  . atorvastatin  40 mg Oral q1800  . clopidogrel  75 mg Oral Daily  . metoprolol succinate  50 mg Oral Daily  . sodium chloride flush  3 mL Intravenous Q12H  . Warfarin - Pharmacist Dosing Inpatient   Does not apply q1800   Continuous Infusions: . sodium chloride     PRN Meds: sodium chloride, acetaminophen, nitroGLYCERIN, ondansetron (ZOFRAN) IV, sodium chloride flush   Vital Signs    Vitals:   08/11/17 0930 08/11/17 1300 08/11/17 1954 08/12/17 0100  BP: (!) 115/54 123/70 107/86 118/71  Pulse: 83 (!) 108 63 78  Resp:   19 17  Temp: 97.8 F (36.6 C) 97.7 F (36.5 C) 98.2 F (36.8 C) (!) 97.3 F (36.3 C)  TempSrc: Oral Oral Oral Oral  SpO2: 99% (!) 83% 100% 97%  Weight:      Height:        Intake/Output Summary (Last 24 hours) at 08/12/2017 0909 Last data filed at 08/11/2017 2200 Gross per 24 hour  Intake 360 ml  Output -  Net 360 ml   Filed Weights   08/09/17 0508 08/10/17 0132 08/11/17 0420  Weight: 144 lb 6.4 oz (65.5 kg) 173 lb (78.5 kg) 175 lb 4.3 oz (79.5 kg)    Telemetry    A fib, frequent PVC's - Personally Reviewed  ECG    n/a - Personally Reviewed  Physical Exam   GEN: No acute distress.   Neck: No JVD Cardiac: Irregular rhythm, no murmurs, rubs, or gallops.  Respiratory: Clear to auscultation bilaterally. GI: Soft, nontender, non-distended  MS: No edema; No deformity. Neuro:  Nonfocal  Psych: Normal affect   Labs    Chemistry Recent Labs  Lab 08/10/17 0503 08/11/17 0926  08/12/17 0624  NA 137 137 138  K 4.2 4.0 4.2  CL 104 102 102  CO2 23 25 26   GLUCOSE 104* 175* 136*  BUN 29* 28* 31*  CREATININE 1.44* 1.54* 1.36*  CALCIUM 8.5* 8.7* 8.5*  GFRNONAA 43* 39* 46*  GFRAA 50* 46* 53*  ANIONGAP 10 10 10      Hematology Recent Labs  Lab 08/08/17 1059 08/09/17 0358 08/10/17 0503  WBC 13.2* 12.5* 10.5  RBC 5.47 5.14 4.58  HGB 16.1 14.7 13.4  HCT 47.9 45.6 41.3  MCV 87.6 88.7 90.2  MCH 29.4 28.6 29.3  MCHC 33.6 32.2 32.4  RDW 13.4 13.4 14.0  PLT 211 223 180    Cardiac Enzymes Recent Labs  Lab 08/08/17 1633 08/08/17 2206 08/09/17 0358  TROPONINI 1.63* 6.54* 20.28*    Recent Labs  Lab 08/08/17 1116  TROPIPOC 0.05     BNPNo results for input(s): BNP, PROBNP in the last 168 hours.   DDimer No results for input(s): DDIMER in the last 168 hours.   Radiology    No results found.  Cardiac Studies   Cath: 08/09/17  Conclusion   Conclusions: 1. Severe 2-vessel coronary artery disease, including 80% in-stent restenosis of the mid LAD with disease extending beyond the distal stent edge, as well  as 70% ostial/proximal LCx disease followed by multifocal mid and distal LCx in-stent restenosis of up to 99%. 2. Moderate, non-obstructive RCA disease. 3. Normal left ventricular filling pressure. 4. Successful PCI to mid LAD with placement of Resolute Onyx 2.75 x 34 mm drug eluting stent (post-dilated to 3.1 mm) with 0% residual stenosis and TIMI-3 flow. 5. Successful PCI to LCx with placement of Resolute Onyx drug-eluting stents extending from the ostium to the proximal segment of old stent (2.5 x 18 mm) and covering the distal 1/3 of old stent extending into the distal LCx (2.0 x 30 mm) with 0% residual stenosis and TIMI-3 flow.  Recommendations: 1. Dual antiplatelet therapy with ASA and clopidogrel. Given a-fib on admission, patient may need anticoagulation after discharge. If anticoagulation is planned, aspirin could be discontinued after 1  month with continuation of clopidogrel and anticoagulant. 2. Obtain echo to evaluate LVEF. 3. Gentle hydration, given CKD.    Echocardiogram (08/10/17):  - Left ventricle: The cavity size was normal. Wall thickness was increased in a pattern of mild LVH. Systolic function was moderately reduced. The estimated ejection fraction was in the range of 35% to 40%. Global hypokinesis with regional variation. The study is not technically sufficient to allow evaluation of LV diastolic function. - Mitral valve: Mildly thickened leaflets . There was trivial regurgitation. - Left atrium: The atrium was normal in size. - Inferior vena cava: The vessel was dilated. The respirophasic diameter changes were blunted (<50%), consistent with elevated central venous pressure.  Impressions:  - LVEF 35-40%, mild LVH, global hypokinesis with regional variation, trivial MR, normal LA size, dilated IVC.   Patient Profile     84 y.o. male with history of CAD and HTN, admitted 08/08/17 with epigastric pain and new onset atrial fibrillation.Noted NSTEMI and underwent cardiac cath.  Assessment & Plan    1. NSTEMI:He remains symptomatically stable. Underwent cath noted above with PCI/DES x2 to Lcx and x1 to mLAD. Will plan for ASA (likely x one month)/Plavix (x 1 year) given the need for warfarin. LVEF moderately reduced as noted above, 35-40%. -- ASA, statin, BB, plavix. Unable to add ACEI/ARB due to soft BP.  2. PAF: Remains in rate controlled atrial fibrillation. Does have frequent PVC's. -- on Toprol XL 50 mg daily, has hx of bradycardia when in SR. I will increase to 75 mg daily due to symptomatic frequent PVC's and check Mg level. K 4.2. -- Could not afford Eliquis. Currently on warfarin. INR 1.07 today (1.16 yesterday). Pharmacy dosing.  3.Chonic kidney disease, stage 2:Cr bumped to 1.44 post cath, down to 1.36 today.  4. HL:LDL above goal 104. Zocor switched to Lipitor  40mg    5. Chronic systolic heart failure: Euvolemic. On Toprol-XL 50 mg daily for HR control as well (I am increasing to 75 mg daily for symptomatic PVC's). Unable to add ACEI/ARB due to soft BP.  6. Constipation: I will provide Dulcolax.    For questions or updates, please contact [place] Please consult [email or web address] for contact info under Cardiology/STEMI.      Signed, Kate Sable, MD  08/12/2017, 9:09 AM

## 2017-08-12 NOTE — Plan of Care (Signed)
VSS and right radial site OTA with slight bruising but soft, no hematoma, good pulses, color, circulation and temp to site. Pt uses call light appropriately and knows how to call for RN/NT using whit board for numbers.

## 2017-08-13 DIAGNOSIS — I255 Ischemic cardiomyopathy: Secondary | ICD-10-CM

## 2017-08-13 DIAGNOSIS — I10 Essential (primary) hypertension: Secondary | ICD-10-CM

## 2017-08-13 LAB — PROTIME-INR
INR: 1.13
Prothrombin Time: 14.4 seconds (ref 11.4–15.2)

## 2017-08-13 MED ORDER — METOPROLOL SUCCINATE ER 25 MG PO TB24: 75.0000 mg | ORAL_TABLET | Freq: Every day | ORAL | 1 refills | Status: DC

## 2017-08-13 MED ORDER — ASPIRIN 81 MG PO CHEW: 81.0000 mg | CHEWABLE_TABLET | Freq: Every day | ORAL | Status: DC

## 2017-08-13 MED ORDER — WARFARIN SODIUM 5 MG PO TABS: 5.0000 mg | ORAL_TABLET | Freq: Once | ORAL | Status: DC

## 2017-08-13 MED ORDER — WARFARIN SODIUM 5 MG PO TABS: 5.0000 mg | ORAL_TABLET | Freq: Once | ORAL | 0 refills | Status: DC

## 2017-08-13 MED ORDER — ATORVASTATIN CALCIUM 40 MG PO TABS: 40.0000 mg | ORAL_TABLET | Freq: Every day | ORAL | 0 refills | Status: DC

## 2017-08-13 NOTE — Discharge Summary (Signed)
Discharge Summary    Patient ID: [name],  MRN: [id number], DOB/AGE: 12-20-1929 84 y.o.  Admit date: 08/08/2017 Discharge date: 08/13/2017  Primary Care Provider: Marton Redwood Primary Cardiologist: Angelena Form   Discharge Diagnoses    Active Problems:   Non-ST elevation (NSTEMI) myocardial infarction [facility])   Atrial fibrillation ([place])   Ischemic cardiomyopathy   Hypertension   Allergies Allergies  Allergen Reactions  . Gadolinium Derivatives Hives and Itching    Dr. Jeralyn Ruths s/w [name]. We observed him for 15 minutes. He did not need to take Benadryl. The symptoms began to subside before he left.     Diagnostic Studies/Procedures    Cath: 08/09/17  Conclusion   Conclusions: 1. Severe 2-vessel coronary artery disease, including 80% in-stent restenosis of the mid LAD with disease extending beyond the distal stent edge, as well as 70% ostial/proximal LCx disease followed by multifocal mid and distal LCx in-stent restenosis of up to 99%. 2. Moderate, non-obstructive RCA disease. 3. Normal left ventricular filling pressure. 4. Successful PCI to mid LAD with placement of Resolute Onyx 2.75 x 34 mm drug eluting stent (post-dilated to 3.1 mm) with 0% residual stenosis and TIMI-3 flow. 5. Successful PCI to LCx with placement of Resolute Onyx drug-eluting stents extending from the ostium to the proximal segment of old stent (2.5 x 18 mm) and covering the distal 1/3 of old stent extending into the distal LCx (2.0 x 30 mm) with 0% residual stenosis and TIMI-3 flow.  Recommendations: 1. Dual antiplatelet therapy with ASA and clopidogrel. Given a-fib on admission, patient may need anticoagulation after discharge. If anticoagulation is planned, aspirin could be discontinued after 1 month with continuation of clopidogrel and anticoagulant. 2. Obtain echo to evaluate LVEF. 3. Gentle hydration, given CKD.  Nelva Bush, MD   TTE: 08/10/17  Study Conclusions  - Left  ventricle: The cavity size was normal. Wall thickness was   increased in a pattern of mild LVH. Systolic function was   moderately reduced. The estimated ejection fraction was in the   range of 35% to 40%. Global hypokinesis with regional variation.   The study is not technically sufficient to allow evaluation of LV   diastolic function. - Mitral valve: Mildly thickened leaflets . There was trivial   regurgitation. - Left atrium: The atrium was normal in size. - Inferior vena cava: The vessel was dilated. The respirophasic   diameter changes were blunted (< 50%), consistent with elevated   central venous pressure.  Impressions:  - LVEF 35-40%, mild LVH, global hypokinesis with regional   variation, trivial MR, normal LA size, dilated IVC.  _____________   History of Present Illness     [name] is a 84 yo male with PMH of CAD s/p stenting of p/mLcx, and mLAD ('07), HTN. He has not had any invasive testing since that time. Last echo was 7/18 that showed normal EF with no valvular disease. He was last seen in the office on 8/18 for routine follow up. No issues reported that visit. Of note has worn a holter monitor in the past with PACs, and PVCs noted.   Presented to the ED today from PCP office. States he is very active and wakes up at 5am everyday. This morning woke up and developed significant abd/epigastric cramping around 6am. Had some nausea and diaphoresis with this. Symptoms waxed and waned but persisted throughout the morning. Eventually presented to PCP office and found to be in new onset Afib. He was  sent to the ED for further work up. Of note he does not remember have an chest pain with previous stent placement back in 2007.   In the ED his labs showed stable electrolytes, POC trop 0.05, Hgb 16.1, Cr 1.26. CXR was negative. EKG showed Afib with nonspecific IVCD. He was giving 2 SL nitro and abd cramping subsided.   Hospital Course     He was admitted for further work  up. Follow up troponin increase to 1.63 and then peaked at 20.28. He was continued on IV heparin and when for cardiac cath the following day. Cath noted 2v CAD with 80% IRS of the mLAD extending beyond the distal edge with 70% in the osital/pLcx with 99% of dLcx. Successful PCI to mLAD and Lcx. Plan for ASA/plavix given need for [place]. He was started on Toprol 25mg  and converted to SR, but then back into Afib. Plan was to start on Eliquis but had no medication coverage, therefore was switched to Coumadin. Toprol further increased to 50mg , then 75mg  as he have freq PVCs. Unable to add ARB/ACEi as blood pressure would not tolerate. Follow up echo showed EF of 35-40% with global hypokinesis. Coumadin was increased from 2.5 to 5mg  as INR was around 1. Discussed with MD and no plans for heparin bridge. Will plan for triple therapy with ASA/plavix/Coumadin for one month, then drop ASA. INR 1.13 on discharge with plans to recheck on 08/15/17. Will discharge on coumadin 5mg  daily after discussion with PhamD. He was able to ambulate with cardiac rehab, but PT consult was recommended. PT evaluated and recommended HHPT but patient refused. Did order rolling walker at the time of discharge.   [name] was seen by Dr. Irish Lack and determined stable for discharge home. Follow up in the office has been arranged. Medications are listed below.   _____________  Discharge Vitals Blood pressure 117/61, pulse 67, temperature 98 F (36.7 C), temperature source Oral, resp. rate 16, height 5\' 11"  (1.803 m), weight 167 lb 6.4 oz (75.9 kg), SpO2 96 %.  Filed Weights   08/10/17 0132 08/11/17 0420 08/13/17 0552  Weight: 173 lb (78.5 kg) 175 lb 4.3 oz (79.5 kg) 167 lb 6.4 oz (75.9 kg)    Labs & Radiologic Studies    CBC No results for input(s): WBC, NEUTROABS, HGB, HCT, MCV, PLT in the last 72 hours. Basic Metabolic Panel Recent Labs    08/11/17 0926 08/12/17 0624 08/12/17 0919  NA 137 138  --   K 4.0 4.2  --   CL  102 102  --   CO2 25 26  --   GLUCOSE 175* 136*  --   BUN 28* 31*  --   CREATININE 1.54* 1.36*  --   CALCIUM 8.7* 8.5*  --   MG  --   --  2.2   Liver Function Tests No results for input(s): AST, ALT, ALKPHOS, BILITOT, PROT, ALBUMIN in the last 72 hours. No results for input(s): LIPASE, AMYLASE in the last 72 hours. Cardiac Enzymes No results for input(s): CKTOTAL, CKMB, CKMBINDEX, TROPONINI in the last 72 hours. BNP Invalid input(s): POCBNP D-Dimer No results for input(s): DDIMER in the last 72 hours. Hemoglobin A1C No results for input(s): HGBA1C in the last 72 hours. Fasting Lipid Panel No results for input(s): CHOL, HDL, LDLCALC, TRIG, CHOLHDL, LDLDIRECT in the last 72 hours. Thyroid Function Tests No results for input(s): TSH, T4TOTAL, T3FREE, THYROIDAB in the last 72 hours.  Invalid input(s): FREET3 _____________  Darletta Moll  Chest 2 View  Result Date: 08/08/2017 CLINICAL DATA:  84 year old male with discomfort in chest and upper abdomen while eating breakfast this morning. EXAM: CHEST  2 VIEW COMPARISON:  No priors. FINDINGS: Lung volumes are normal. No consolidative airspace disease. No pleural effusions. No pneumothorax. No pulmonary nodule or mass noted. Pulmonary vasculature and the cardiomediastinal silhouette are within normal limits. Aortic atherosclerosis. IMPRESSION: 1. No radiographic evidence of acute cardiopulmonary disease. 2. Aortic atherosclerosis. Electronically Signed   By: Vinnie Langton M.D.   On: 08/08/2017 12:38   Disposition   Pt is being discharged home today in good condition.  Follow-up Plans & Appointments    Follow-up Information    Imogene Burn, PA-C Follow up on 08/20/2017.   Specialty:  Cardiology Why:  at 12pm for your follow up appt.  Contact information: [address] [address] [phone]        [facility] Follow up on 08/15/2017.   Specialty:  Cardiology Why:  at 2:30pm for your INR  (coumadin) check.  Contact information: [address], [place] [phone]         Discharge Instructions    ([place]) Call MD:  Anytime you have any of the following symptoms: 1) 3 pound weight gain in 24 hours or 5 pounds in 1 week 2) shortness of breath, with or without a dry hacking cough 3) swelling in the hands, feet or stomach 4) if you have to sleep on extra pillows at night in order to breathe.   Complete by:  As directed    Amb Referral to Cardiac Rehabilitation   Complete by:  As directed    Diagnosis:   NSTEMI Coronary Stents     Call MD for:  redness, tenderness, or signs of infection (pain, swelling, redness, odor or green/yellow discharge around incision site)   Complete by:  As directed    Diet - low sodium heart healthy   Complete by:  As directed    Discharge instructions   Complete by:  As directed    Radial Site Care Refer to this sheet in the next few weeks. These instructions provide you with information on caring for yourself after your procedure. Your caregiver may also give you more specific instructions. Your treatment has been planned according to current medical practices, but problems sometimes occur. Call your caregiver if you have any problems or questions after your procedure. HOME CARE INSTRUCTIONS You may shower the day after the procedure.Remove the bandage (dressing) and gently wash the site with plain soap and water.Gently pat the site dry.  Do not apply powder or lotion to the site.  Do not submerge the affected site in water for 3 to 5 days.  Inspect the site at least twice daily.  Do not flex or bend the affected arm for 24 hours.  No lifting over 5 pounds (2.3 kg) for 5 days after your procedure.  Do not drive home if you are discharged the same day of the procedure. Have someone else drive you.  You may drive 24 hours after the procedure unless otherwise instructed by your caregiver.   What to expect: Any bruising will usually fade within 1 to 2 weeks.  Blood that collects in the tissue (hematoma) may be painful to the touch. It should usually decrease in size and tenderness within 1 to 2 weeks.  SEEK IMMEDIATE MEDICAL CARE IF: You have unusual pain  at the radial site.  You have redness, warmth, swelling, or pain at the radial site.  You have drainage (other than a small amount of blood on the dressing).  You have chills.  You have a fever or persistent symptoms for more than 72 hours.  You have a fever and your symptoms suddenly get worse.  Your arm becomes pale, cool, tingly, or numb.  You have heavy bleeding from the site. Hold pressure on the site.   PLEASE DO NOT MISS ANY DOSES OF YOUR PLAVIX!!!!! Also keep a log of you blood pressures and bring back to your follow up appt. Please call the office with any questions. You will be on triple therapy with aspirin, plavix and coumadin for one month. Then plan for only plavix and coumadin. Please monitor for signs and symptoms of bleeding with these medications.    Patients taking blood thinners should generally stay away from medicines like ibuprofen, Advil, Motrin, naproxen, and Aleve due to risk of stomach bleeding. You may take Tylenol as directed or talk to your primary doctor about alternatives.  Please do not drive or attend any meetings for at least one week.   For home use only DME 4 wheeled rolling walker with seat   Complete by:  As directed    Patient needs a walker to treat with the following condition:  Deconditioned low back   Increase activity slowly   Complete by:  As directed       Discharge Medications     Medication List    STOP taking these medications   aspirin 325 MG EC tablet Replaced by:  aspirin 81 MG chewable tablet   lisinopril 20 MG tablet Commonly known as:  PRINIVIL,ZESTRIL   simvastatin 40 MG tablet Commonly known as:  ZOCOR     TAKE these medications   aspirin 81 MG  chewable tablet Chew 1 tablet (81 mg total) by mouth daily. Start taking on:  08/14/2017 Replaces:  aspirin 325 MG EC tablet   atorvastatin 40 MG tablet Commonly known as:  LIPITOR Take 1 tablet (40 mg total) by mouth daily at 6 PM.   clopidogrel 75 MG tablet Commonly known as:  PLAVIX TAKE ONE TABLET EACH DAY   metoprolol succinate 25 MG 24 hr tablet Commonly known as:  TOPROL-XL Take 3 tablets (75 mg total) by mouth daily. Start taking on:  08/14/2017   nitroGLYCERIN 0.4 MG SL tablet Commonly known as:  NITROSTAT Place 0.4 mg under the tongue every 5 (five) minutes as needed for chest pain.   ranitidine 150 MG capsule Commonly known as:  ZANTAC Take 1 capsule (150 mg total) by mouth 2 (two) times daily.   warfarin 5 MG tablet Commonly known as:  COUMADIN Take 1 tablet (5 mg total) by mouth one time only at 6 PM.        Aspirin prescribed at discharge?  Yes High Intensity Statin Prescribed? (Lipitor 40-80mg  or Crestor 20-40mg ): Yes Beta Blocker Prescribed? Yes For EF <40%, was ACEI/ARB Prescribed? No: Blood pressures to soft.  ADP Receptor Inhibitor Prescribed? (i.e. Plavix etc.-Includes Medically Managed Patients): Yes For EF <40%, Aldosterone Inhibitor Prescribed? No: Blood pressures to soft Was EF assessed during THIS hospitalization? Yes Was Cardiac Rehab II ordered? (Included Medically managed Patients): Yes   Outstanding Labs/Studies   FLP/LFTs, BMET at follow up. INR check on 2/6  Duration of Discharge Encounter   Greater than 30 minutes including physician time.  Signed, Reino Bellis NP-C 08/13/2017, 1:43 PM  I have examined the patient and reviewed assessment and plan and discussed with patient.  Agree with above as stated.  Coumadin started post cath.  OK to discharge.   I stressed the importance of avoiding falls.  He has  a cane at home but does not use this regularly.  He will need an INR check on Thursday.  Plan for ASA, Plavix and Coumadin for 30  days.  Stop aspirin after 30 days and continue plavix and COumadin.    D/w daughter as well.  Patient has no h/o stroke per his report.  AFib was a new diagnosis.  She lives very close.  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  Larae Grooms

## 2017-08-13 NOTE — Progress Notes (Signed)
ANTICOAGULATION CONSULT NOTE - follow up Pharmacy Consult for Warfarin Indication: Afib  Allergies  Allergen Reactions  . Gadolinium Derivatives Hives and Itching    Dr. Jeralyn Ruths s/w [name]. We observed him for 15 minutes. He did not need to take Benadryl. The symptoms began to subside before he left.     Patient Measurements: Height: 5\' 11"  (180.3 cm) Weight: 167 lb 6.4 oz (75.9 kg) IBW/kg (Calculated) : 75.3   Vital Signs: Temp: 98 F (36.7 C) (02/04 0552) Temp Source: Oral (02/04 0552) BP: 117/61 (02/04 0552) Pulse Rate: 67 (02/04 0552)  Labs: Recent Labs    08/11/17 0308 08/11/17 0926 08/12/17 0624 08/13/17 0631  LABPROT 14.7  --  13.8 14.4  INR 1.16  --  1.07 1.13  CREATININE  --  1.54* 1.36*  --     Estimated Creatinine Clearance: 42.3 mL/min (A) (by C-G formula based on SCr of 1.36 mg/dL (H)).   Medical History: Past Medical History:  Diagnosis Date  . Arthritis   . Chest pain, unspecified   . Coronary atherosclerosis of unspecified type of vessel, native or graft   . Dizziness and giddiness   . Hypertension   . Myocardial infarction ([place])   . PVD (peripheral vascular disease) ([place])   . Shortness of breath    exertion    Assessment: 87yom s/p PCI now on asa81mg  and clopidogrel.  New Afib this admission, thus started anticoagulation. Intially started on apixaban x1 dose, but switched to warfarin due to cost. Per MD, no need to bridge with heparin while on DAPT. CBC stable with no signs/sx of bleeding. Variable food intake. DDI noted with atorvastatin.  INR remains subtherapeutic, 1.13. No bleeding noted. Cardio PA called me today stated plan to dc home today 2/4 on warfarin and 1st  INR check on Wed. 2/6., PA reported that cardiologist is okay to let INR  drift up to goal noting that patient is also on ASA and plavix.    Goal of Therapy:  INR 2-3 Monitor platelets by anticoagulation protocol: Yes   Plan:  Warfarin 5 mg PO x1.   For potential  discharge today, I recommended warfarin 5mg  daily with INR check on 2/6 Wed.  Daily INR, CBC while inpatient. F/u s/sx bleeding, PO intake, DDI  Thank you for allowing pharmacy to be part of this patients care team. Nicole Cella, [place] Clinical Pharmacist Pager: [phone] or [phone] (330p-1030p) Main Rx [phone] 08/13/2017 11:42 AM

## 2017-08-13 NOTE — Progress Notes (Signed)
CARDIAC REHAB PHASE I   PRE:  Rate/Rhythm: 81 afib  BP:  Supine:   Sitting: 138/80  Standing:    SaO2: 97%RA  MODE:  Ambulation: 470 ft   POST:  Rate/Rhythm: 93 afib  BP:  Supine:   Sitting: 130/83  Standing:    SaO2: 98%RA 0925-1015 Pt walked 470 ft independently holding to siderail. Encouraged pt to take his time as he is on bloodthinners and he lives alone. Family live next door. He has cane that he does not use much that family is to bring in. No c/o CP. Pt does not want a RW or HHPT. I would like for PT to evaluate for home safety. Asked RN to pt in order as we need to see how steady pt is with a cane since he will not have side rail at home.    Graylon Good, RN BSN  08/13/2017 10:11 AM

## 2017-08-13 NOTE — Care Management Important Message (Signed)
Important Message  Patient Details  Name: [name] MRN: [id number] Date of Birth: 05/25/1930   Medicare Important Message Given:  Yes    [name] P [name] 08/13/2017, 3:55 PM

## 2017-08-13 NOTE — Evaluation (Addendum)
Physical Therapy Evaluation and D/C Patient Details Name: [name] MRN: [id number] DOB: March 05, 1930 Today's Date: 08/13/2017   History of Present Illness  84 y.o. male with history of CAD and HTN, admitted 08/08/17 with epigastric pain and new onset atrial fibrillation. Noted NSTEMI and underwent cardiac cath. Hx. of CHF  Clinical Impression  Pt admitted with above diagnosis. Pt currently with functional limitations due to the deficits listed below (see PT Problem List). Pt was able to ambulate in hallway with RW with good safety with RW.  Pt agreed to use RW if it means he can go home.  Also recommend HHPT but pt does not want that stating my daughter is right across the road.  Still feel that a HHPT can benefit pt to progress his balance and gait.Will not follow as pt is discharging today per chart.   Follow Up Recommendations Home health PT;Supervision - Intermittent    Equipment Recommendations  Rolling walker with 5" wheels    Recommendations for Other Services       Precautions / Restrictions Precautions Precautions: Fall Restrictions Weight Bearing Restrictions: No      Mobility  Bed Mobility Overal bed mobility: Independent                Transfers Overall transfer level: Independent                  Ambulation/Gait Ambulation/Gait assistance: Supervision Ambulation Distance (Feet): 450 Feet Assistive device: Rolling walker (2 wheeled);None Gait Pattern/deviations: Step-through pattern;Decreased stride length;Trunk flexed   Gait velocity interpretation: Below normal speed for age/gender General Gait Details: Overall, pt did well with ambulation.  Does tend to want to grab furniture and sink in room when walking without device.  Pt very safe with RW and pt does agree he will use one if we order it. He states he has cane but didn't always use it.    Stairs Stairs: Yes Stairs assistance: Modified independent (Device/Increase time) Stair Management:  One rail Left;Forwards;Step to pattern Number of Stairs: 3 General stair comments: Pt was able to ascend and descend steps with rail without LOB.   Wheelchair Mobility    Modified Rankin (Stroke Patients Only)       Balance Overall balance assessment: Needs assistance Sitting-balance support: No upper extremity supported;Feet supported Sitting balance-Leahy Scale: Fair     Standing balance support: Bilateral upper extremity supported;During functional activity Standing balance-Leahy Scale: Fair Standing balance comment: can stand statically without UE support.                  Standardized Balance Assessment Standardized Balance Assessment : Dynamic Gait Index   Dynamic Gait Index Level Surface: Normal Change in Gait Speed: Normal Gait with Horizontal Head Turns: Mild Impairment Gait with Vertical Head Turns: Mild Impairment Gait and Pivot Turn: Normal Step Over Obstacle: Mild Impairment Step Around Obstacles: Mild Impairment Steps: Moderate Impairment Total Score: 18       Pertinent Vitals/Pain Pain Assessment: No/denies pain   VSS Home Living Family/patient expects to be discharged to:: Private residence Living Arrangements: Alone Available Help at Discharge: Family;Available PRN/intermittently(daughter lives next door) Type of Home: House Home Access: Stairs to enter Entrance Stairs-Rails: Left Entrance Stairs-Number of Steps: 3 Home Layout: One level Home Equipment: Cane - single point      Prior Function Level of Independence: Independent with assistive device(s)               Hand Dominance  Extremity/Trunk Assessment   Upper Extremity Assessment Upper Extremity Assessment: Defer to OT evaluation    Lower Extremity Assessment Lower Extremity Assessment: Generalized weakness    Cervical / Trunk Assessment Cervical / Trunk Assessment: Normal  Communication   Communication: No difficulties  Cognition Arousal/Alertness:  Awake/alert Behavior During Therapy: WFL for tasks assessed/performed Overall Cognitive Status: Within Functional Limits for tasks assessed                                        General Comments General comments (skin integrity, edema, etc.): scored 18/24 on DGI suggesting risk of falls without device.  Pt aware that PT recommends he use device.     Exercises     Assessment/Plan    PT Assessment All further PT needs can be met in the next venue of care  PT Problem List Decreased activity tolerance;Decreased balance;Decreased mobility;Decreased knowledge of use of DME;Decreased safety awareness;Decreased knowledge of precautions       PT Treatment Interventions      PT Goals (Current goals can be found in the Care Plan section)  Acute Rehab PT Goals Patient Stated Goal: to go home today PT Goal Formulation: All assessment and education complete, DC therapy    Frequency     Barriers to discharge        Co-evaluation               AM-PAC PT "6 Clicks" Daily Activity  Outcome Measure Difficulty turning over in bed (including adjusting bedclothes, sheets and blankets)?: None Difficulty moving from lying on back to sitting on the side of the bed? : None Difficulty sitting down on and standing up from a chair with arms (e.g., wheelchair, bedside commode, etc,.)?: None Help needed moving to and from a bed to chair (including a wheelchair)?: None Help needed walking in hospital room?: A Little Help needed climbing 3-5 steps with a railing? : None 6 Click Score: 23    End of Session Equipment Utilized During Treatment: Gait belt Activity Tolerance: Patient limited by fatigue Patient left: in bed;with call bell/phone within reach(sitting on EOB.) Nurse Communication: Mobility status PT Visit Diagnosis: Muscle weakness (generalized) (M62.81);Unsteadiness on feet (R26.81)    Time: 1206-1218 PT Time Calculation (min) (ACUTE ONLY): 12 min   Charges:    PT Evaluation $PT Eval Low Complexity: 1 Low     PT G Codes:        Laquiesha Piacente,PT Acute Rehabilitation [phone] (pager)   Denice Paradise 08/13/2017, 1:08 PM

## 2017-08-13 NOTE — Care Management Note (Signed)
Case Management Note  Patient Details  Name: [name] MRN: [id number] Date of Birth: 02-05-1930  Subjective/Objective: Pt presented for Atrial Fib- PTA from home with support of daughter. PT recommendations for [facility] PT Services/ DME Rollator.                Action/Plan: Pt is declining HH Services and DME Rollator. Daughter states she will be in and out to check on patient. CM did make both aware that if pt has home needs to contact PCP for orders. No further needs from CM at this time.   Expected Discharge Date:  08/13/17               Expected Discharge Plan:     In-House Referral:  NA  Discharge planning Services  CM Consult, Medication Assistance  Post Acute Care Choice:  NA Choice offered to:  NA  DME Arranged:  N/A DME Agency:  NA  HH Arranged:  Patient Refused [place] Agency:  NA  Status of Service:  Completed, signed off  If discussed at [place] of Stay Meetings, dates discussed:    Additional Comments:  Bethena Roys, RN 08/13/2017, 3:44 PM

## 2017-08-13 NOTE — Progress Notes (Signed)
Progress Note  Patient Name: [name] Date of Encounter: 08/13/2017  Primary Cardiologist: Dr. Angelena Form  Subjective   States he has intermittent chest pain and nausea.  Denies SOB  Inpatient Medications    Scheduled Meds: . aspirin  81 mg Oral Daily  . atorvastatin  40 mg Oral q1800  . clopidogrel  75 mg Oral Daily  . metoprolol succinate  75 mg Oral Daily  . sodium chloride flush  3 mL Intravenous Q12H  . Warfarin - Pharmacist Dosing Inpatient   Does not apply q1800   Continuous Infusions: . sodium chloride     PRN Meds: sodium chloride, acetaminophen, nitroGLYCERIN, ondansetron (ZOFRAN) IV, sodium chloride flush   Vital Signs    Vitals:   08/12/17 0100 08/12/17 1419 08/12/17 2137 08/13/17 0552  BP: 118/71 121/76 111/71 117/61  Pulse: 78 83 69 67  Resp: 17 18 16    Temp: (!) 97.3 F (36.3 C) 97.8 F (36.6 C) 97.6 F (36.4 C) 98 F (36.7 C)  TempSrc: Oral Oral Oral Oral  SpO2: 97% 97% 97% 96%  Weight:    167 lb 6.4 oz (75.9 kg)  Height:        Intake/Output Summary (Last 24 hours) at 08/13/2017 0902 Last data filed at 08/13/2017 0700 Gross per 24 hour  Intake 360 ml  Output -  Net 360 ml   Filed Weights   08/10/17 0132 08/11/17 0420 08/13/17 0552  Weight: 173 lb (78.5 kg) 175 lb 4.3 oz (79.5 kg) 167 lb 6.4 oz (75.9 kg)    Telemetry    Afib with PVCs and nonsustained Vtach- Personally Reviewed  ECG    n/a - Personally Reviewed  Physical Exam   GEN: No acute distress.   Neck: No JVD Cardiac: Irregular rhythm, no murmurs, rubs, or gallops.  Respiratory: Clear to auscultation bilaterally. GI: Soft, nontender, non-distended  MS: No edema; No deformity. Neuro:  Nonfocal  Psych: Normal affect   Labs    Chemistry Recent Labs  Lab 08/10/17 0503 08/11/17 0926 08/12/17 0624  NA 137 137 138  K 4.2 4.0 4.2  CL 104 102 102  CO2 23 25 26   GLUCOSE 104* 175* 136*  BUN 29* 28* 31*  CREATININE 1.44* 1.54* 1.36*  CALCIUM 8.5* 8.7* 8.5*    GFRNONAA 43* 39* 46*  GFRAA 50* 46* 53*  ANIONGAP 10 10 10      Hematology Recent Labs  Lab 08/08/17 1059 08/09/17 0358 08/10/17 0503  WBC 13.2* 12.5* 10.5  RBC 5.47 5.14 4.58  HGB 16.1 14.7 13.4  HCT 47.9 45.6 41.3  MCV 87.6 88.7 90.2  MCH 29.4 28.6 29.3  MCHC 33.6 32.2 32.4  RDW 13.4 13.4 14.0  PLT 211 223 180    Cardiac Enzymes Recent Labs  Lab 08/08/17 1633 08/08/17 2206 08/09/17 0358  TROPONINI 1.63* 6.54* 20.28*    Recent Labs  Lab 08/08/17 1116  TROPIPOC 0.05     BNPNo results for input(s): BNP, PROBNP in the last 168 hours.   DDimer No results for input(s): DDIMER in the last 168 hours.   Radiology    No results found.  Cardiac Studies   Cath: 08/09/17  Conclusion   Conclusions: 1. Severe 2-vessel coronary artery disease, including 80% in-stent restenosis of the mid LAD with disease extending beyond the distal stent edge, as well as 70% ostial/proximal LCx disease followed by multifocal mid and distal LCx in-stent restenosis of up to 99%. 2. Moderate, non-obstructive RCA disease. 3. Normal left ventricular  filling pressure. 4. Successful PCI to mid LAD with placement of Resolute Onyx 2.75 x 34 mm drug eluting stent (post-dilated to 3.1 mm) with 0% residual stenosis and TIMI-3 flow. 5. Successful PCI to LCx with placement of Resolute Onyx drug-eluting stents extending from the ostium to the proximal segment of old stent (2.5 x 18 mm) and covering the distal 1/3 of old stent extending into the distal LCx (2.0 x 30 mm) with 0% residual stenosis and TIMI-3 flow.  Recommendations: 1. Dual antiplatelet therapy with ASA and clopidogrel. Given a-fib on admission, patient may need anticoagulation after discharge. If anticoagulation is planned, aspirin could be discontinued after 1 month with continuation of clopidogrel and anticoagulant. 2. Obtain echo to evaluate LVEF. 3. Gentle hydration, given CKD.    Echocardiogram (08/10/17):  - Left ventricle:  The cavity size was normal. Wall thickness was increased in a pattern of mild LVH. Systolic function was moderately reduced. The estimated ejection fraction was in the range of 35% to 40%. Global hypokinesis with regional variation. The study is not technically sufficient to allow evaluation of LV diastolic function. - Mitral valve: Mildly thickened leaflets . There was trivial regurgitation. - Left atrium: The atrium was normal in size. - Inferior vena cava: The vessel was dilated. The respirophasic diameter changes were blunted (<50%), consistent with elevated central venous pressure.  Impressions:  - LVEF 35-40%, mild LVH, global hypokinesis with regional variation, trivial MR, normal LA size, dilated IVC.   Patient Profile     84 y.o. male with history of CAD and HTN, admitted 08/08/17 with epigastric pain and new onset atrial fibrillation.Noted NSTEMI and underwent cardiac cath.  Assessment & Plan    NSTEMI He remains symptomatically stable. Underwent cath with PCI/DES x2 to Lcx and x1 to mLAD. Plan is for ASA (likely x one month)/Plavix (x 1 year) given the need for warfarin. LVEF moderately reduced 35-40%. - Continue ASA, statin, BB, plavix. Unable to add ACEI/ARB due to soft BP.  PAF Remains in rate controlled atrial fibrillation. Does have PVC's. -Toprol XL 75 mg daily -mag and potassium OK - Currently on warfarin. Subtherapeutic, INR 1.13 today (1.07 yesterday). Pharmacy dosing.   Chonic kidney disease, stage 2 Cr bumped to 1.44 post cath, down to 1.36 yesterday  Hyperlipidemia LDL above goal 104. Zocor switched to Lipitor 40mg    Chronic systolic heart failure Euvolemic. On Toprol-XL 75 mg daily. Unable to add ACEI/ARB due to soft BP.  Constipation   Dulcolax.    For questions or updates, please contact [place] Please consult [email or web address] for contact info under Cardiology/STEMI.      Signed, Boyd Kerbs, DO    08/13/2017, 9:02 AM    I have examined the patient and reviewed assessment and plan and discussed with patient.  Agree with above as stated.  Coumadin started post cath.  OK to discharge.  WIll try to determine needs at home.  I stressed the importance of avoiding falls.  He has  A cane at home but does not use this regularly.  He will need an INR check on Thursday.  Plan for ASA, Plavix and Coumadin for 30 days.  Stop aspirin after 30 days and continue plavix and COumadin.    D/w daughter as well.  Patient has no h/o stroke per his report.  AFib was a new diagnosis.  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  Larae Grooms

## 2017-08-15 ENCOUNTER — Ambulatory Visit (INDEPENDENT_AMBULATORY_CARE_PROVIDER_SITE_OTHER): Payer: Medicare Other | Admitting: *Deleted

## 2017-08-15 DIAGNOSIS — Z7901 Long term (current) use of anticoagulants: Secondary | ICD-10-CM | POA: Diagnosis not present

## 2017-08-15 DIAGNOSIS — I480 Paroxysmal atrial fibrillation: Secondary | ICD-10-CM

## 2017-08-15 DIAGNOSIS — Z5181 Encounter for therapeutic drug level monitoring: Secondary | ICD-10-CM

## 2017-08-15 LAB — POCT INR: INR: 1.2

## 2017-08-15 MED ORDER — WARFARIN SODIUM 5 MG PO TABS: ORAL_TABLET | ORAL | 0 refills | Status: DC

## 2017-08-15 NOTE — Patient Instructions (Addendum)
   A full discussion of the nature of anticoagulants has been carried out.  A benefit risk analysis has been presented to the patient, so that they understand the justification for choosing anticoagulation at this time. The need for frequent and regular monitoring, precise dosage adjustment and compliance is stressed.  Side effects of potential bleeding are discussed.  The patient should avoid any OTC items containing aspirin or ibuprofen, and should avoid great swings in general diet.  Avoid alcohol consumption.  Call if any signs of abnormal bleeding.  Description   Today Feb 6th take 1 and 1/2 tablets (7.5mg ) then continue on 1 tablet (5mg ) daily Recheck INR in 1 week Call with any questions new medications or if scheduled for any procedures [phone]

## 2017-08-16 ENCOUNTER — Telehealth (HOSPITAL_COMMUNITY): Payer: Self-pay

## 2017-08-16 NOTE — Telephone Encounter (Signed)
Patients insurance is active and benefits verified through Medicare Part A & B - No co-pay, deductible amount of $185.00/$185.00 has been met, no out pocket, 20% co-insurance, and no pre-authorization is required. Passport/reference [phone]  Patients insurance is active and benefits verified through [place] to [employer]. No co-pay, no deductible, no out of pocket, no co-insurance, and no pre-authorization is required. Carlette spoke with [place] Reference #[id number]  Patient will be contacted and scheduled after their follow up appt with the Cardiologist office upon review by the RN Navigator.

## 2017-08-17 ENCOUNTER — Telehealth (HOSPITAL_COMMUNITY): Payer: Self-pay

## 2017-08-17 NOTE — Telephone Encounter (Signed)
Called patient to see if interested in the program - patient stated he is not interested. Closed referral.

## 2017-08-20 ENCOUNTER — Ambulatory Visit (INDEPENDENT_AMBULATORY_CARE_PROVIDER_SITE_OTHER): Payer: Medicare Other | Admitting: Physician Assistant

## 2017-08-20 ENCOUNTER — Encounter: Payer: Self-pay | Admitting: Physician Assistant

## 2017-08-20 VITALS — BP 150/60 | HR 55 | Ht 71.0 in | Wt 177.8 lb

## 2017-08-20 DIAGNOSIS — I2510 Atherosclerotic heart disease of native coronary artery without angina pectoris: Secondary | ICD-10-CM

## 2017-08-20 DIAGNOSIS — I255 Ischemic cardiomyopathy: Secondary | ICD-10-CM | POA: Diagnosis not present

## 2017-08-20 DIAGNOSIS — I480 Paroxysmal atrial fibrillation: Secondary | ICD-10-CM

## 2017-08-20 DIAGNOSIS — Z7901 Long term (current) use of anticoagulants: Secondary | ICD-10-CM | POA: Diagnosis not present

## 2017-08-20 DIAGNOSIS — I10 Essential (primary) hypertension: Secondary | ICD-10-CM

## 2017-08-20 NOTE — Progress Notes (Signed)
Cardiology Office Note    Date:  08/20/2017   ID:  [name], DOB 10-01-1929, MRN [id number]  PCP:  Marton Redwood, MD  Cardiologist: Lauree Chandler, MD  Chief Complaint  Patient presents with  . Follow-up    History of Present Illness:  [name] is a 84 y.o. male with history of CAD status post stenting of the circumflex and mid LAD in 2007, hypertension, echo 01/2017 normal LVEF.  He was admitted to the hospital from his PCP with new onset atrial fibrillation presenting with abdominal cramping and was found to have an NSTEMI.  Troponins peaked at 20.28.  Cath showed 80% in-stent restenosis of the mid LAD extending beyond the distal edge was 70% and ostial proximal circumflex and 99% distal circumflex.  He underwent PCI/DES to the mid LAD and circumflex.  Plan for aspirin and Plavix.  He converted to normal sinus rhythm but went back into atrial fibrillation he was also started on Eliquis but had no medication coverage so was switched to Coumadin.  Plan to stop aspirin after 1 month. 2D echo showed LVEF 35-40% with global hypokinesis.  They were unable to add an ARB or ACE inhibitor because of low blood pressure.  Patient comes in today for follow-up accompanied by his daughter.  Overall he has done well but does feel a little woozy and off balance today for some reason.  He denies any chest pain, palpitations, dyspnea, dyspnea on exertion, dizziness or presyncope.  He says once he starts walking he is okay but when he is sitting for a while and gets up he feels a little unsteady.  He wants to start driving again.  Past Medical History:  Diagnosis Date  . Arthritis   . Chest pain, unspecified   . Coronary atherosclerosis of unspecified type of vessel, native or graft   . Dizziness and giddiness   . Hypertension   . Myocardial infarction ([place])   . PVD (peripheral vascular disease) ([place])   . Shortness of breath    exertion    Past Surgical History:  Procedure  Laterality Date  . CORONARY ANGIOPLASTY     4 stents  . CORONARY STENT INTERVENTION N/A 08/09/2017   Procedure: CORONARY STENT INTERVENTION;  Surgeon: Nelva Bush, MD;  Location: [facility];  Service: Cardiovascular;  Laterality: N/A;  . INGUINAL HERNIA REPAIR  06/14/2012   Procedure: HERNIA REPAIR INGUINAL ADULT;  Surgeon: Odis Hollingshead, MD;  Location: [facility];  Service: General;  Laterality: Right;  . INSERTION OF MESH  06/14/2012   Procedure: INSERTION OF MESH;  Surgeon: Odis Hollingshead, MD;  Location: [facility];  Service: General;  Laterality: Right;  . LEFT HEART CATH AND CORONARY ANGIOGRAPHY N/A 08/09/2017   Procedure: LEFT HEART CATH AND CORONARY ANGIOGRAPHY;  Surgeon: Nelva Bush, MD;  Location: [facility];  Service: Cardiovascular;  Laterality: N/A;  . SINUS SURGERY WITH INSTATRAK  1985    Current Medications: Current Meds  Medication Sig  . aspirin 81 MG chewable tablet Chew 1 tablet (81 mg total) by mouth daily.  Marland Kitchen atorvastatin (LIPITOR) 40 MG tablet Take 1 tablet (40 mg total) by mouth daily at 6 PM.  . clopidogrel (PLAVIX) 75 MG tablet TAKE ONE TABLET EACH DAY  . metoprolol succinate (TOPROL-XL) 25 MG 24 hr tablet Take 3 tablets (75 mg total) by mouth daily.  Marland Kitchen warfarin (COUMADIN) 5 MG tablet Take as directed by coumadin clinic     Allergies:  Gadolinium derivatives   Social History   Socioeconomic History  . Marital status: Divorced    Spouse name: None  . Number of children: 3  . Years of education: None  . Highest education level: None  Social Needs  . Financial resource strain: None  . Food insecurity - worry: None  . Food insecurity - inability: None  . Transportation needs - medical: None  . Transportation needs - non-medical: None  Occupational History  . Occupation: Magazine features editor    Comment: [employer]  Tobacco Use  . Smoking status: Former Smoker    Last attempt to quit: 07/10/1981    Years since quitting: 36.1   . Smokeless tobacco: Never Used  Substance and Sexual Activity  . Alcohol use: No  . Drug use: No  . Sexual activity: None  Other Topics Concern  . None  Social History Narrative   Regular exercise           Family History:  The patient's family history includes Heart attack (age of onset: 62) in his father.   ROS:   Please see the history of present illness.    Review of Systems  Constitution: Positive for weakness and malaise/fatigue.  HENT: Negative.   Cardiovascular: Negative.   Respiratory: Negative.   Endocrine: Negative.   Hematologic/Lymphatic: Negative.   Musculoskeletal: Negative.   Gastrointestinal: Positive for nausea.  Genitourinary: Positive for hesitancy and incomplete emptying.   All other systems reviewed and are negative.   PHYSICAL EXAM:   VS:  BP (!) 150/60   Pulse (!) 55   Ht 5\' 11"  (1.803 m)   Wt 177 lb 12.8 oz (80.6 kg)   SpO2 95%   BMI 24.80 kg/m   Physical Exam  GEN: Well nourished, well developed, in no acute distress  Neck: no JVD, carotid bruits, or masses Cardiac:RRR; positive S4 Respiratory:  clear to auscultation bilaterally, normal work of breathing GI: soft, nontender, nondistended, + BS Ext: Right arm at cath site without hematoma or hemorrhage, some bruising, good radial brachial pulses, lower extremities without cyanosis, clubbing, or edema, Good distal pulses bilaterally Neuro:  Alert and Oriented x 3, Strength and sensation are intact Psych: euthymic mood, full affect  Wt Readings from Last 3 Encounters:  08/20/17 177 lb 12.8 oz (80.6 kg)  08/13/17 167 lb 6.4 oz (75.9 kg)  02/14/17 175 lb 3.2 oz (79.5 kg)      Studies/Labs Reviewed:   EKG:  EKG is  ordered today.  The ekg ordered today demonstrates sinus bradycardia at 55 bpm  Recent Labs: 11/14/2016: ALT 12 08/08/2017: TSH 0.926 08/10/2017: Hemoglobin 13.4; Platelets 180 08/12/2017: BUN 31; Creatinine, Ser 1.36; Magnesium 2.2; Potassium 4.2; Sodium 138   Lipid Panel     Component Value Date/Time   CHOL 160 08/09/2017 0359   TRIG 74 08/09/2017 0359   HDL 41 08/09/2017 0359   CHOLHDL 3.9 08/09/2017 0359   VLDL 15 08/09/2017 0359   LDLCALC 104 (H) 08/09/2017 0359    Additional studies/ records that were reviewed today include:  Cath: 08/09/17   Conclusion    Conclusions: 1. Severe 2-vessel coronary artery disease, including 80% in-stent restenosis of the mid LAD with disease extending beyond the distal stent edge, as well as 70% ostial/proximal LCx disease followed by multifocal mid and distal LCx in-stent restenosis of up to 99%. 2. Moderate, non-obstructive RCA disease. 3. Normal left ventricular filling pressure. 4. Successful PCI to mid LAD with placement of Resolute Onyx 2.75 x  34 mm drug eluting stent (post-dilated to 3.1 mm) with 0% residual stenosis and TIMI-3 flow. 5. Successful PCI to LCx with placement of Resolute Onyx drug-eluting stents extending from the ostium to the proximal segment of old stent (2.5 x 18 mm) and covering the distal 1/3 of old stent extending into the distal LCx (2.0 x 30 mm) with 0% residual stenosis and TIMI-3 flow.   Recommendations: 1. Dual antiplatelet therapy with ASA and clopidogrel. Given a-fib on admission, patient may need anticoagulation after discharge. If anticoagulation is planned, aspirin could be discontinued after 1 month with continuation of clopidogrel and anticoagulant. 2. Obtain echo to evaluate LVEF. 3. Gentle hydration, given CKD.     TTE: 08/10/17   Study Conclusions   - Left ventricle: The cavity size was normal. Wall thickness was   increased in a pattern of mild LVH. Systolic function was   moderately reduced. The estimated ejection fraction was in the   range of 35% to 40%. Global hypokinesis with regional variation.   The study is not technically sufficient to allow evaluation of LV   diastolic function. - Mitral valve: Mildly thickened leaflets . There was trivial   regurgitation. -  Left atrium: The atrium was normal in size. - Inferior vena cava: The vessel was dilated. The respirophasic   diameter changes were blunted (< 50%), consistent with elevated   central venous pressure.   Impressions:   - LVEF 35-40%, mild LVH, global hypokinesis with regional   variation, trivial MR, normal LA size, dilated IVC.       ASSESSMENT:    1. Atherosclerosis of native coronary artery of native heart without angina pectoris   2. Paroxysmal atrial fibrillation (HCC)   3. HYPERTENSION, BENIGN   4. Ischemic cardiomyopathy   5. Long term (current) use of anticoagulants [Z79.01]      PLAN:  In order of problems listed above:  CAD status post recent NSTEMI in the setting of new onset atrial fibrillation and no chest pain status post DES to the LAD for in-stent restenosis and circumflex 08/12/17.  Prior DES to the circumflex and mid LAD in 2007.  Now patient on aspirin, Plavix and Coumadin.  To stop aspirin after 1 month.  Add Zantac.  Follow-up with Dr. Angelena Form later this month.  PAF new for the patient CHA2DS2-VASc equals 4-5 and the patient started on Coumadin because he could not afford NOAC.  In normal sinus rhythm today.  Hypertension blood pressure up today.  We start ACE inhibitor/ARB if renal function is normal.  Last creatinine 1.36.  Ischemic cardiomyopathy ejection fraction 35-40% on 2D echo not started on ACE inhibitor in the hospital because of hypotension.  Will start if renal function okay.  Long-term anticoagulation with Coumadin for atrial fibrillation    Medication Adjustments/Labs and Tests Ordered: Current medicines are reviewed at length with the patient today.  Concerns regarding medicines are outlined above.  Medication changes, Labs and Tests ordered today are listed in the Patient Instructions below. Patient Instructions  Medication Instructions:  Your physician recommends that you continue on your current medications as directed. Please refer to  the Current Medication list given to you today.   Labwork: TODAY:  BMET  Testing/Procedures: None ordered  Follow-Up: Your physician recommends that you schedule a follow-up appointment in: [place]  Any Other Special Instructions Will Be Listed Below (If Applicable).   DASH Eating Plan DASH stands for "Dietary Approaches to Stop Hypertension." The DASH eating  plan is a healthy eating plan that has been shown to reduce high blood pressure (hypertension). It may also reduce your risk for type 2 diabetes, heart disease, and stroke. The DASH eating plan may also help with weight loss. What are tips for following this plan? General guidelines  Avoid eating more than 2,000 mg (milligrams) of salt (sodium) a day. If you have hypertension, you may need to reduce your sodium intake to 1,500 mg a day.  Limit alcohol intake to no more than 1 drink a day for nonpregnant women and 2 drinks a day for men. One drink equals 12 oz of beer, 5 oz of wine, or 1 oz of hard liquor.  Work with your health care provider to maintain a healthy body weight or to lose weight. Ask what an ideal weight is for you.  Get at least 30 minutes of exercise that causes your heart to beat faster (aerobic exercise) most days of the week. Activities may include walking, swimming, or biking.  Work with your health care provider or diet and nutrition specialist (dietitian) to adjust your eating plan to your individual calorie needs. Reading food labels  Check food labels for the amount of sodium per serving. Choose foods with less than 5 percent of the Daily Value of sodium. Generally, foods with less than 300 mg of sodium per serving fit into this eating plan.  To find whole grains, look for the word "whole" as the first word in the ingredient list. Shopping  Buy products labeled as "low-sodium" or "no salt added."  Buy fresh foods. Avoid canned foods and premade or frozen  meals. Cooking  Avoid adding salt when cooking. Use salt-free seasonings or herbs instead of table salt or sea salt. Check with your health care provider or pharmacist before using salt substitutes.  Do not fry foods. Cook foods using healthy methods such as baking, boiling, grilling, and broiling instead.  Cook with heart-healthy oils, such as olive, canola, soybean, or sunflower oil. Meal planning   Eat a balanced diet that includes: ? 5 or more servings of fruits and vegetables each day. At each meal, try to fill half of your plate with fruits and vegetables. ? Up to 6-8 servings of whole grains each day. ? Less than 6 oz of lean meat, poultry, or fish each day. A 3-oz serving of meat is about the same size as a deck of cards. One egg equals 1 oz. ? 2 servings of low-fat dairy each day. ? A serving of nuts, seeds, or beans 5 times each week. ? Heart-healthy fats. Healthy fats called Omega-3 fatty acids are found in foods such as flaxseeds and coldwater fish, like sardines, salmon, and mackerel.  Limit how much you eat of the following: ? Canned or prepackaged foods. ? Food that is high in trans fat, such as fried foods. ? Food that is high in saturated fat, such as fatty meat. ? Sweets, desserts, sugary drinks, and other foods with added sugar. ? Full-fat dairy products.  Do not salt foods before eating.  Try to eat at least 2 vegetarian meals each week.  Eat more home-cooked food and less restaurant, buffet, and fast food.  When eating at a restaurant, ask that your food be prepared with less salt or no salt, if possible. What foods are recommended? The items listed may not be a complete list. Talk with your dietitian about what dietary choices are best for you. Grains Whole-grain or whole-wheat bread. Whole-grain or whole-wheat  pasta. Brown rice. Modena Morrow. Bulgur. Whole-grain and low-sodium cereals. Pita bread. Low-fat, low-sodium crackers. Whole-wheat flour  tortillas. Vegetables Fresh or frozen vegetables (raw, steamed, roasted, or grilled). Low-sodium or reduced-sodium tomato and vegetable juice. Low-sodium or reduced-sodium tomato sauce and tomato paste. Low-sodium or reduced-sodium canned vegetables. Fruits All fresh, dried, or frozen fruit. Canned fruit in natural juice (without added sugar). Meat and other protein foods Skinless chicken or [place]. Ground chicken or [place]. Pork with fat trimmed off. Fish and seafood. Egg whites. Dried beans, peas, or lentils. Unsalted nuts, nut butters, and seeds. Unsalted canned beans. Lean cuts of beef with fat trimmed off. Low-sodium, lean deli meat. Dairy Low-fat (1%) or fat-free (skim) milk. Fat-free, low-fat, or reduced-fat cheeses. Nonfat, low-sodium ricotta or cottage cheese. Low-fat or nonfat yogurt. Low-fat, low-sodium cheese. Fats and oils Soft margarine without trans fats. Vegetable oil. Low-fat, reduced-fat, or light mayonnaise and salad dressings (reduced-sodium). Canola, safflower, olive, soybean, and sunflower oils. Avocado. Seasoning and other foods Herbs. Spices. Seasoning mixes without salt. Unsalted popcorn and pretzels. Fat-free sweets. What foods are not recommended? The items listed may not be a complete list. Talk with your dietitian about what dietary choices are best for you. Grains Baked goods made with fat, such as croissants, muffins, or some breads. Dry pasta or rice meal packs. Vegetables Creamed or fried vegetables. Vegetables in a cheese sauce. Regular canned vegetables (not low-sodium or reduced-sodium). Regular canned tomato sauce and paste (not low-sodium or reduced-sodium). Regular tomato and vegetable juice (not low-sodium or reduced-sodium). Angie Fava. Olives. Fruits Canned fruit in a light or heavy syrup. Fried fruit. Fruit in cream or butter sauce. Meat and other protein foods Fatty cuts of meat. Ribs. Fried meat. Berniece Salines. Sausage. Bologna and other processed lunch meats.  Salami. Fatback. Hotdogs. Bratwurst. Salted nuts and seeds. Canned beans with added salt. Canned or smoked fish. Whole eggs or egg yolks. Chicken or [place] with skin. Dairy Whole or 2% milk, cream, and half-and-half. Whole or full-fat cream cheese. Whole-fat or sweetened yogurt. Full-fat cheese. Nondairy creamers. Whipped toppings. Processed cheese and cheese spreads. Fats and oils Butter. Stick margarine. Lard. Shortening. Ghee. Bacon fat. Tropical oils, such as coconut, palm kernel, or palm oil. Seasoning and other foods Salted popcorn and pretzels. Onion salt, garlic salt, seasoned salt, table salt, and sea salt. Worcestershire sauce. Tartar sauce. Barbecue sauce. Teriyaki sauce. Soy sauce, including reduced-sodium. Steak sauce. Canned and packaged gravies. Fish sauce. Oyster sauce. Cocktail sauce. Horseradish that you find on the shelf. Ketchup. Mustard. Meat flavorings and tenderizers. Bouillon cubes. Hot sauce and Tabasco sauce. Premade or packaged marinades. Premade or packaged taco seasonings. Relishes. Regular salad dressings. Where to find more information:  National Heart, Lung, and [place]: https://wilson-eaton.com/  American Heart Association: www.heart.org Summary  The DASH eating plan is a healthy eating plan that has been shown to reduce high blood pressure (hypertension). It may also reduce your risk for type 2 diabetes, heart disease, and stroke.  With the DASH eating plan, you should limit salt (sodium) intake to 2,300 mg a day. If you have hypertension, you may need to reduce your sodium intake to 1,500 mg a day.  When on the DASH eating plan, aim to eat more fresh fruits and vegetables, whole grains, lean proteins, low-fat dairy, and heart-healthy fats.  Work with your health care provider or diet and nutrition specialist (dietitian) to adjust your eating plan to your individual calorie needs. This information is not intended to replace advice given to you by  your health  care provider. Make sure you discuss any questions you have with your health care provider. Document Released: 06/15/2011 Document Revised: 06/19/2016 Document Reviewed: 06/19/2016 Elsevier Interactive Patient Education  [employer].    If you need a refill on your cardiac medications before your next appointment, please call your pharmacy.      Sumner Boast, PA-C  08/20/2017 12:46 PM    [facility] [place], [place], [place]  [address] Phone: [phone]; Fax: [phone]

## 2017-08-20 NOTE — Patient Instructions (Addendum)
Medication Instructions:  Your physician recommends that you continue on your current medications as directed. Please refer to the Current Medication list given to you today.   Labwork: TODAY:  BMET  Testing/Procedures: None ordered  Follow-Up: Your physician recommends that you schedule a follow-up appointment in: [place]  Any Other Special Instructions Will Be Listed Below (If Applicable).   DASH Eating Plan DASH stands for "Dietary Approaches to Stop Hypertension." The DASH eating plan is a healthy eating plan that has been shown to reduce high blood pressure (hypertension). It may also reduce your risk for type 2 diabetes, heart disease, and stroke. The DASH eating plan may also help with weight loss. What are tips for following this plan? General guidelines  Avoid eating more than 2,000 mg (milligrams) of salt (sodium) a day. If you have hypertension, you may need to reduce your sodium intake to 1,500 mg a day.  Limit alcohol intake to no more than 1 drink a day for nonpregnant women and 2 drinks a day for men. One drink equals 12 oz of beer, 5 oz of wine, or 1 oz of hard liquor.  Work with your health care provider to maintain a healthy body weight or to lose weight. Ask what an ideal weight is for you.  Get at least 30 minutes of exercise that causes your heart to beat faster (aerobic exercise) most days of the week. Activities may include walking, swimming, or biking.  Work with your health care provider or diet and nutrition specialist (dietitian) to adjust your eating plan to your individual calorie needs. Reading food labels  Check food labels for the amount of sodium per serving. Choose foods with less than 5 percent of the Daily Value of sodium. Generally, foods with less than 300 mg of sodium per serving fit into this eating plan.  To find whole grains, look for the word "whole" as the first word in the ingredient  list. Shopping  Buy products labeled as "low-sodium" or "no salt added."  Buy fresh foods. Avoid canned foods and premade or frozen meals. Cooking  Avoid adding salt when cooking. Use salt-free seasonings or herbs instead of table salt or sea salt. Check with your health care provider or pharmacist before using salt substitutes.  Do not fry foods. Cook foods using healthy methods such as baking, boiling, grilling, and broiling instead.  Cook with heart-healthy oils, such as olive, canola, soybean, or sunflower oil. Meal planning   Eat a balanced diet that includes: ? 5 or more servings of fruits and vegetables each day. At each meal, try to fill half of your plate with fruits and vegetables. ? Up to 6-8 servings of whole grains each day. ? Less than 6 oz of lean meat, poultry, or fish each day. A 3-oz serving of meat is about the same size as a deck of cards. One egg equals 1 oz. ? 2 servings of low-fat dairy each day. ? A serving of nuts, seeds, or beans 5 times each week. ? Heart-healthy fats. Healthy fats called Omega-3 fatty acids are found in foods such as flaxseeds and coldwater fish, like sardines, salmon, and mackerel.  Limit how much you eat of the following: ? Canned or prepackaged foods. ? Food that is high in trans fat, such as fried foods. ? Food that is high in saturated fat, such as fatty meat. ? Sweets, desserts, sugary drinks, and other foods with added sugar. ? Full-fat dairy products.  Do  not salt foods before eating.  Try to eat at least 2 vegetarian meals each week.  Eat more home-cooked food and less restaurant, buffet, and fast food.  When eating at a restaurant, ask that your food be prepared with less salt or no salt, if possible. What foods are recommended? The items listed may not be a complete list. Talk with your dietitian about what dietary choices are best for you. Grains Whole-grain or whole-wheat bread. Whole-grain or whole-wheat pasta. Brown  rice. [place]. Bulgur. Whole-grain and low-sodium cereals. Pita bread. Low-fat, low-sodium crackers. Whole-wheat flour tortillas. Vegetables Fresh or frozen vegetables (raw, steamed, roasted, or grilled). Low-sodium or reduced-sodium tomato and vegetable juice. Low-sodium or reduced-sodium tomato sauce and tomato paste. Low-sodium or reduced-sodium canned vegetables. Fruits All fresh, dried, or frozen fruit. Canned fruit in natural juice (without added sugar). Meat and other protein foods Skinless chicken or [place]. Ground chicken or [place]. Pork with fat trimmed off. Fish and seafood. Egg whites. Dried beans, peas, or lentils. Unsalted nuts, nut butters, and seeds. Unsalted canned beans. Lean cuts of beef with fat trimmed off. Low-sodium, lean deli meat. Dairy Low-fat (1%) or fat-free (skim) milk. Fat-free, low-fat, or reduced-fat cheeses. Nonfat, low-sodium ricotta or cottage cheese. Low-fat or nonfat yogurt. Low-fat, low-sodium cheese. Fats and oils Soft margarine without trans fats. Vegetable oil. Low-fat, reduced-fat, or light mayonnaise and salad dressings (reduced-sodium). Canola, safflower, olive, soybean, and sunflower oils. Avocado. Seasoning and other foods Herbs. Spices. Seasoning mixes without salt. Unsalted popcorn and pretzels. Fat-free sweets. What foods are not recommended? The items listed may not be a complete list. Talk with your dietitian about what dietary choices are best for you. Grains Baked goods made with fat, such as croissants, muffins, or some breads. Dry pasta or rice meal packs. Vegetables Creamed or fried vegetables. Vegetables in a cheese sauce. Regular canned vegetables (not low-sodium or reduced-sodium). Regular canned tomato sauce and paste (not low-sodium or reduced-sodium). Regular tomato and vegetable juice (not low-sodium or reduced-sodium). Angie Fava. Olives. Fruits Canned fruit in a light or heavy syrup. Fried fruit. Fruit in cream or butter  sauce. Meat and other protein foods Fatty cuts of meat. Ribs. Fried meat. Berniece Salines. Sausage. Bologna and other processed lunch meats. Salami. Fatback. Hotdogs. Bratwurst. Salted nuts and seeds. Canned beans with added salt. Canned or smoked fish. Whole eggs or egg yolks. Chicken or [place] with skin. Dairy Whole or 2% milk, cream, and half-and-half. Whole or full-fat cream cheese. Whole-fat or sweetened yogurt. Full-fat cheese. Nondairy creamers. Whipped toppings. Processed cheese and cheese spreads. Fats and oils Butter. Stick margarine. Lard. Shortening. Ghee. Bacon fat. Tropical oils, such as coconut, palm kernel, or palm oil. Seasoning and other foods Salted popcorn and pretzels. Onion salt, garlic salt, seasoned salt, table salt, and sea salt. Worcestershire sauce. Tartar sauce. Barbecue sauce. Teriyaki sauce. Soy sauce, including reduced-sodium. Steak sauce. Canned and packaged gravies. Fish sauce. Oyster sauce. Cocktail sauce. Horseradish that you find on the shelf. Ketchup. Mustard. Meat flavorings and tenderizers. Bouillon cubes. Hot sauce and Tabasco sauce. Premade or packaged marinades. Premade or packaged taco seasonings. Relishes. Regular salad dressings. Where to find more information:  National Heart, Lung, and [place]: https://wilson-eaton.com/  American Heart Association: www.heart.org Summary  The DASH eating plan is a healthy eating plan that has been shown to reduce high blood pressure (hypertension). It may also reduce your risk for type 2 diabetes, heart disease, and stroke.  With the DASH eating plan, you should limit salt (sodium) intake  to 2,300 mg a day. If you have hypertension, you may need to reduce your sodium intake to 1,500 mg a day.  When on the DASH eating plan, aim to eat more fresh fruits and vegetables, whole grains, lean proteins, low-fat dairy, and heart-healthy fats.  Work with your health care provider or diet and nutrition specialist (dietitian) to adjust  your eating plan to your individual calorie needs. This information is not intended to replace advice given to you by your health care provider. Make sure you discuss any questions you have with your health care provider. Document Released: 06/15/2011 Document Revised: 06/19/2016 Document Reviewed: 06/19/2016 Elsevier Interactive Patient Education  [employer].    If you need a refill on your cardiac medications before your next appointment, please call your pharmacy.

## 2017-08-21 ENCOUNTER — Telehealth: Payer: Self-pay | Admitting: *Deleted

## 2017-08-21 ENCOUNTER — Ambulatory Visit (INDEPENDENT_AMBULATORY_CARE_PROVIDER_SITE_OTHER): Payer: Medicare Other

## 2017-08-21 DIAGNOSIS — I480 Paroxysmal atrial fibrillation: Secondary | ICD-10-CM | POA: Diagnosis not present

## 2017-08-21 DIAGNOSIS — Z7901 Long term (current) use of anticoagulants: Secondary | ICD-10-CM

## 2017-08-21 DIAGNOSIS — Z79899 Other long term (current) drug therapy: Secondary | ICD-10-CM

## 2017-08-21 LAB — BASIC METABOLIC PANEL
BUN / CREAT RATIO: 15 (ref 10–24)
BUN: 17 mg/dL (ref 8–27)
CO2: 24 mmol/L (ref 20–29)
Calcium: 9.3 mg/dL (ref 8.6–10.2)
Chloride: 101 mmol/L (ref 96–106)
Creatinine, Ser: 1.17 mg/dL (ref 0.76–1.27)
GFR, EST AFRICAN AMERICAN: 65 mL/min/{1.73_m2} (ref >59)
GFR, EST NON AFRICAN AMERICAN: 57 mL/min/{1.73_m2} — AB (ref >59)
Glucose: 99 mg/dL (ref 65–99)
POTASSIUM: 5 mmol/L (ref 3.5–5.2)
SODIUM: 140 mmol/L (ref 134–144)

## 2017-08-21 LAB — POCT INR: INR: 2.6

## 2017-08-21 MED ORDER — LISINOPRIL 5 MG PO TABS: 5.0000 mg | ORAL_TABLET | Freq: Every day | ORAL | 3 refills | Status: DC

## 2017-08-21 NOTE — Patient Instructions (Signed)
Description   Continue on 1 tablet (5mg ) daily. Recheck INR in 1 week.  Call with any questions new medications or if scheduled for any procedures [phone]

## 2017-08-21 NOTE — Telephone Encounter (Signed)
-----   Message from Imogene Burn, PA-C sent at 08/21/2017  1:19 PM EST ----- Renal function normal. Start lisinopril 5 mg once daily. F/u bmet in 2 weeks

## 2017-08-28 DIAGNOSIS — E538 Deficiency of other specified B group vitamins: Secondary | ICD-10-CM | POA: Diagnosis not present

## 2017-08-28 DIAGNOSIS — Z125 Encounter for screening for malignant neoplasm of prostate: Secondary | ICD-10-CM | POA: Diagnosis not present

## 2017-08-28 DIAGNOSIS — E7849 Other hyperlipidemia: Secondary | ICD-10-CM | POA: Diagnosis not present

## 2017-08-28 DIAGNOSIS — R82998 Other abnormal findings in urine: Secondary | ICD-10-CM | POA: Diagnosis not present

## 2017-08-28 DIAGNOSIS — I10 Essential (primary) hypertension: Secondary | ICD-10-CM | POA: Diagnosis not present

## 2017-08-29 ENCOUNTER — Ambulatory Visit (INDEPENDENT_AMBULATORY_CARE_PROVIDER_SITE_OTHER): Payer: Medicare Other | Admitting: *Deleted

## 2017-08-29 ENCOUNTER — Encounter: Payer: Self-pay | Admitting: Cardiovascular Disease

## 2017-08-29 ENCOUNTER — Ambulatory Visit (INDEPENDENT_AMBULATORY_CARE_PROVIDER_SITE_OTHER): Payer: Medicare Other | Admitting: Cardiovascular Disease

## 2017-08-29 VITALS — BP 122/66 | HR 49 | Ht 71.0 in | Wt 176.1 lb

## 2017-08-29 DIAGNOSIS — Z7901 Long term (current) use of anticoagulants: Secondary | ICD-10-CM

## 2017-08-29 DIAGNOSIS — I480 Paroxysmal atrial fibrillation: Secondary | ICD-10-CM

## 2017-08-29 DIAGNOSIS — I255 Ischemic cardiomyopathy: Secondary | ICD-10-CM

## 2017-08-29 DIAGNOSIS — I2510 Atherosclerotic heart disease of native coronary artery without angina pectoris: Secondary | ICD-10-CM | POA: Diagnosis not present

## 2017-08-29 DIAGNOSIS — I10 Essential (primary) hypertension: Secondary | ICD-10-CM

## 2017-08-29 LAB — POCT INR: INR: 4

## 2017-08-29 NOTE — Progress Notes (Signed)
Chief Complaint  Patient presents with  . Coronary Artery Disease     History of Present Illness: 84 yo male with history of CAD, HTN who is here today for follow up. First cardiac stents placed in the Circumflex and LAD in 2007. The stents were Taxus drug eluting stents. LV function was normal at that time. I saw him 01/15/17 and he had c/o leg weakness, dizziness. He had one fall due to sudden onset of weakness in his left leg. 48 hour cardiac monitor showed sinus bradycardia with PVCs, PACs and a short run of a wide complex tachycardia. Echo 01/25/17 with normal LV systolic function, no significant valve disease. He was admitted to [facility] 08/08/17 with a NSTEMI and was found to have severe disease in the LAD and Circumflex, both treated with drug eluting stents. (2 drug eluting stents placed in the Circumflex and one drug eluting stent placed in the LAD). He was also found to have atrial fibrillation and was started on coumadin. He did not wish to start Eliquis due to lack of medication coverage with his insurance. Echo with LVEF=35-40%. Ace inh not started in hospital due to hypotension but started last week.   He is here today for follow up. The patient denies any chest pain, dyspnea, palpitations, lower extremity edema, orthopnea, PND, near syncope or syncope.  He has occasional dizziness. Overall feels well.    Primary Care Physician: Marton Redwood, MD  Past Medical History:  Diagnosis Date  . Arthritis   . Chest pain, unspecified   . Coronary atherosclerosis of unspecified type of vessel, native or graft   . Dizziness and giddiness   . Hypertension   . Myocardial infarction ([place])   . PVD (peripheral vascular disease) ([place])   . Shortness of breath    exertion    Past Surgical History:  Procedure Laterality Date  . CORONARY ANGIOPLASTY     4 stents  . CORONARY STENT INTERVENTION N/A 08/09/2017   Procedure: CORONARY STENT INTERVENTION;  Surgeon: Nelva Bush, MD;  Location: [place] [facility];  Service: Cardiovascular;  Laterality: N/A;  . INGUINAL HERNIA REPAIR  06/14/2012   Procedure: HERNIA REPAIR INGUINAL ADULT;  Surgeon: Odis Hollingshead, MD;  Location: [facility];  Service: General;  Laterality: Right;  . INSERTION OF MESH  06/14/2012   Procedure: INSERTION OF MESH;  Surgeon: Odis Hollingshead, MD;  Location: [facility];  Service: General;  Laterality: Right;  . LEFT HEART CATH AND CORONARY ANGIOGRAPHY N/A 08/09/2017   Procedure: LEFT HEART CATH AND CORONARY ANGIOGRAPHY;  Surgeon: Nelva Bush, MD;  Location: [place] [facility];  Service: Cardiovascular;  Laterality: N/A;  . SINUS SURGERY WITH INSTATRAK  1985    Current Outpatient Medications  Medication Sig Dispense Refill  . atorvastatin (LIPITOR) 40 MG tablet Take 1 tablet (40 mg total) by mouth daily at 6 PM. 90 tablet 0  . clopidogrel (PLAVIX) 75 MG tablet TAKE ONE TABLET EACH DAY 30 tablet 7  . lisinopril (PRINIVIL,ZESTRIL) 5 MG tablet Take 1 tablet (5 mg total) by mouth daily. 90 tablet 3  . metoprolol succinate (TOPROL-XL) 25 MG 24 hr tablet Take 3 tablets (75 mg total) by mouth daily. 30 tablet 1  . warfarin (COUMADIN) 5 MG tablet Take as directed by coumadin clinic 30 tablet 0   No current facility-administered medications for this visit.     Allergies  Allergen Reactions  . Gadolinium Derivatives Hives and Itching    Dr. Jeralyn Ruths s/w  Mr. Tweed. We observed him for 15 minutes. He did not need to take Benadryl. The symptoms began to subside before he left.     Social History   Socioeconomic History  . Marital status: Divorced    Spouse name: Not on file  . Number of children: 3  . Years of education: Not on file  . Highest education level: Not on file  Social Needs  . Financial resource strain: Not on file  . Food insecurity - worry: Not on file  . Food insecurity - inability: Not on file  . Transportation needs - medical: Not on file  . Transportation needs - non-medical: Not on file    Occupational History  . Occupation: Magazine features editor    Comment: [employer]  Tobacco Use  . Smoking status: Former Smoker    Last attempt to quit: 07/10/1981    Years since quitting: 36.1  . Smokeless tobacco: Never Used  Substance and Sexual Activity  . Alcohol use: No  . Drug use: No  . Sexual activity: Not on file  Other Topics Concern  . Not on file  Social History Narrative   Regular exercise          Family History  Problem Relation Age of Onset  . Heart attack Father 86    Review of Systems:  As stated in the HPI and otherwise negative.   BP 122/66   Pulse (!) 49   Ht 5\' 11"  (1.803 m)   Wt 176 lb 1.9 oz (79.9 kg)   SpO2 99%   BMI 24.56 kg/m   Physical Examination:  General: Well developed, well nourished, NAD  HEENT: OP clear, mucus membranes moist  SKIN: warm, dry. No rashes. Neuro: No focal deficits  Musculoskeletal: Muscle strength 5/5 all ext  Psychiatric: Mood and affect normal  Neck: No JVD, no carotid bruits, no thyromegaly, no lymphadenopathy.  Lungs:Clear bilaterally, no wheezes, rhonci, crackles Cardiovascular: Loletha Grayer, regular. No murmurs, gallops or rubs. Abdomen:Soft. Bowel sounds present. Non-tender.  Extremities: No lower extremity edema. Pulses are 2 + in the bilateral DP/PT.   Echo 08/10/17: - Left ventricle: The cavity size was normal. Wall thickness was   increased in a pattern of mild LVH. Systolic function was   moderately reduced. The estimated ejection fraction was in the   range of 35% to 40%. Global hypokinesis with regional variation.   The study is not technically sufficient to allow evaluation of LV   diastolic function. - Mitral valve: Mildly thickened leaflets . There was trivial   regurgitation. - Left atrium: The atrium was normal in size. - Inferior vena cava: The vessel was dilated. The respirophasic   diameter changes were blunted (< 50%), consistent with elevated   central venous  pressure.  Impressions:  - LVEF 35-40%, mild LVH, global hypokinesis with regional   variation, trivial MR, normal LA size, dilated IVC.  Cardiac cath 08/09/17:  1. Severe 2-vessel coronary artery disease, including 80% in-stent restenosis of the mid LAD with disease extending beyond the distal stent edge, as well as 70% ostial/proximal LCx disease followed by multifocal mid and distal LCx in-stent restenosis of up to 99%. 2. Moderate, non-obstructive RCA disease. 3. Normal left ventricular filling pressure. 4. Successful PCI to mid LAD with placement of Resolute Onyx 2.75 x 34 mm drug eluting stent (post-dilated to 3.1 mm) with 0% residual stenosis and TIMI-3 flow. 5. Successful PCI to LCx with placement of Resolute Onyx drug-eluting stents extending from the ostium  to the proximal segment of old stent (2.5 x 18 mm) and covering the distal 1/3 of old stent extending into the distal LCx (2.0 x 30 mm) with 0% residual stenosis and TIMI-3 flow. 6.  EKG:  EKG is not ordered today. The ekg ordered today demonstrates   Recent Labs: 11/14/2016: ALT 12 08/08/2017: TSH 0.926 08/10/2017: Hemoglobin 13.4; Platelets 180 08/12/2017: Magnesium 2.2 08/20/2017: BUN 17; Creatinine, Ser 1.17; Potassium 5.0; Sodium 140   Lipid Panel    Component Value Date/Time   CHOL 160 08/09/2017 0359   TRIG 74 08/09/2017 0359   HDL 41 08/09/2017 0359   CHOLHDL 3.9 08/09/2017 0359   VLDL 15 08/09/2017 0359   LDLCALC 104 (H) 08/09/2017 0359     Wt Readings from Last 3 Encounters:  08/29/17 176 lb 1.9 oz (79.9 kg)  08/20/17 177 lb 12.8 oz (80.6 kg)  08/13/17 167 lb 6.4 oz (75.9 kg)     Other studies Reviewed: Additional studies/ records that were reviewed today include: . Review of the above records demonstrates:    Assessment and Plan:   1. CAD without angina: No chest pain suggestive of angina following recent PCI. Will continue ASA and Plavix for one month post PCI then stop ASA given use of coumadin.  Will continue beta blocker. HR is 50 bpm today.   2. Atrial fibrillation, paroxysmal: His HR is regular today. He appears to be in sinus today. Will continue Toprol. Continue coumadin. If he were to have near syncope or syncope with HR in the 50s, would need to consider EP consult for pacemaker.   3. HTN: BP is controlled. No changes today.    4. Ischemic cardiomyopathy: LVEF around 40% by echo January 2019. This may be an ischemic cardiomyopathy and hopefully LV function will normalize following his PCI. Volume status is normal. Will continue beta blocker and Ace-inh.   5. Blood in stool: Will stop ASA Will continue Plavix and Coumadin. INR is high at 4 today. Will hold coumadin tomorrow per recs of coumadin clinic. If he continues to have heme in stool, will need to discuss with Dr. Brigitte Pulse and consider GI referral. He will need both Plavix and coumadin on board if possible.    Current medicines are reviewed at length with the patient today.  The patient does not have concerns regarding medicines.  The following changes have been made:  no change  Labs/ tests ordered today include:   No orders of the defined types were placed in this encounter.   Disposition:   FU with me in 6  months  Signed, Lauree Chandler, MD 08/29/2017 10:53 AM    [facility] [address], [place], [place]  [address] Phone: [phone]; Fax: [phone]

## 2017-08-29 NOTE — Patient Instructions (Signed)
Medication Instructions:  Your physician has recommended you make the following change in your medication:  Stop aspirin   Labwork: none  Testing/Procedures: none  Follow-Up: Your physician recommends that you schedule a follow-up appointment in: 3 months.     Any Other Special Instructions Will Be Listed Below (If Applicable).     If you need a refill on your cardiac medications before your next appointment, please call your pharmacy.

## 2017-08-29 NOTE — Patient Instructions (Signed)
Description   Do not take any Coumadin tomorrow then continue on 1 tablet (5mg ) daily. Recheck INR in 1 week. Call with any questions new medications or if scheduled for any procedures [phone] Pt will need Lab appt for BMP scheduled for 2 weeks (09/05/17) f/u Lisinopril start, order in Epic please coordinate with follow-up Coumadin appt. Thanks

## 2017-09-03 ENCOUNTER — Other Ambulatory Visit: Payer: Self-pay | Admitting: Cardiovascular Disease

## 2017-09-03 ENCOUNTER — Telehealth: Payer: Self-pay | Admitting: Pharmacist

## 2017-09-03 MED ORDER — CLOPIDOGREL BISULFATE 75 MG PO TABS: ORAL_TABLET | ORAL | 3 refills | Status: DC

## 2017-09-03 MED ORDER — WARFARIN SODIUM 5 MG PO TABS: ORAL_TABLET | ORAL | 1 refills | Status: DC

## 2017-09-03 NOTE — Telephone Encounter (Signed)
Pt's medications were sent to pt's pharmacy as requested. Confirmation received.  

## 2017-09-03 NOTE — Telephone Encounter (Signed)
Pt's daughter called Coumadin clinic requesting refill of metoprolol and clopidogrel at [facility] Informed her that we only refill anticoagulants and that I will forward refill request to our refill team.

## 2017-09-05 ENCOUNTER — Telehealth: Payer: Self-pay | Admitting: Cardiovascular Disease

## 2017-09-05 ENCOUNTER — Ambulatory Visit (INDEPENDENT_AMBULATORY_CARE_PROVIDER_SITE_OTHER): Payer: Medicare Other | Admitting: *Deleted

## 2017-09-05 DIAGNOSIS — Z1389 Encounter for screening for other disorder: Secondary | ICD-10-CM | POA: Diagnosis not present

## 2017-09-05 DIAGNOSIS — I480 Paroxysmal atrial fibrillation: Secondary | ICD-10-CM | POA: Diagnosis not present

## 2017-09-05 DIAGNOSIS — Z23 Encounter for immunization: Secondary | ICD-10-CM | POA: Diagnosis not present

## 2017-09-05 DIAGNOSIS — E7849 Other hyperlipidemia: Secondary | ICD-10-CM | POA: Diagnosis not present

## 2017-09-05 DIAGNOSIS — Z7901 Long term (current) use of anticoagulants: Secondary | ICD-10-CM

## 2017-09-05 DIAGNOSIS — Z79899 Other long term (current) drug therapy: Secondary | ICD-10-CM | POA: Diagnosis not present

## 2017-09-05 DIAGNOSIS — Z0000 Encounter for general adult medical examination without abnormal findings: Secondary | ICD-10-CM | POA: Diagnosis not present

## 2017-09-05 DIAGNOSIS — Z5181 Encounter for therapeutic drug level monitoring: Secondary | ICD-10-CM

## 2017-09-05 DIAGNOSIS — I252 Old myocardial infarction: Secondary | ICD-10-CM | POA: Diagnosis not present

## 2017-09-05 DIAGNOSIS — I10 Essential (primary) hypertension: Secondary | ICD-10-CM | POA: Diagnosis not present

## 2017-09-05 DIAGNOSIS — N183 Chronic kidney disease, stage 3 (moderate): Secondary | ICD-10-CM | POA: Diagnosis not present

## 2017-09-05 DIAGNOSIS — Z6824 Body mass index (BMI) 24.0-24.9, adult: Secondary | ICD-10-CM | POA: Diagnosis not present

## 2017-09-05 DIAGNOSIS — R7301 Impaired fasting glucose: Secondary | ICD-10-CM | POA: Diagnosis not present

## 2017-09-05 DIAGNOSIS — I739 Peripheral vascular disease, unspecified: Secondary | ICD-10-CM | POA: Diagnosis not present

## 2017-09-05 DIAGNOSIS — R82998 Other abnormal findings in urine: Secondary | ICD-10-CM | POA: Diagnosis not present

## 2017-09-05 DIAGNOSIS — K921 Melena: Secondary | ICD-10-CM | POA: Diagnosis not present

## 2017-09-05 LAB — POCT INR: INR: 3.3

## 2017-09-05 NOTE — Telephone Encounter (Signed)
Spoke with Audrea Muscat, pharmacist at [facility] and advised her that we are aware that patient is taking warfarin and plavix and that we are monitoring him regularly. She thanked me for the call.

## 2017-09-05 NOTE — Patient Instructions (Signed)
Description   Do not take any Coumadin today Feb 27th then change coumadin dose to 5mg  (1 tablet) daily except 2.5mg  (1/2 tablet) on Sundays . Recheck INR in 1 week. Call with any questions new medications or if scheduled for any procedures [phone]

## 2017-09-05 NOTE — Telephone Encounter (Signed)
New Message   Pt c/o medication issue:  1. Name of Medication: plavix and warfarin  2. How are you currently taking this medication (dosage and times per day)?   3. Are you having a reaction (difficulty breathing--STAT)? none 4. What is your medication issue? Audrea Muscat from the pharmacy is calling in reference to rx. She advises that generally Plavix and Warfarin are not taken together. They just want to be sure that the patient should be taking them together. Please call to discuss.

## 2017-09-06 ENCOUNTER — Telehealth: Payer: Self-pay

## 2017-09-06 DIAGNOSIS — R7989 Other specified abnormal findings of blood chemistry: Secondary | ICD-10-CM

## 2017-09-06 LAB — BASIC METABOLIC PANEL
BUN / CREAT RATIO: 15 (ref 10–24)
BUN: 20 mg/dL (ref 8–27)
CO2: 24 mmol/L (ref 20–29)
CREATININE: 1.32 mg/dL — AB (ref 0.76–1.27)
Calcium: 9.1 mg/dL (ref 8.6–10.2)
Chloride: 107 mmol/L — ABNORMAL HIGH (ref 96–106)
GFR calc Af Amer: 56 mL/min/{1.73_m2} — ABNORMAL LOW (ref >59)
GFR, EST NON AFRICAN AMERICAN: 49 mL/min/{1.73_m2} — AB (ref >59)
Glucose: 95 mg/dL (ref 65–99)
POTASSIUM: 4.9 mmol/L (ref 3.5–5.2)
SODIUM: 145 mmol/L — AB (ref 134–144)

## 2017-09-06 NOTE — Telephone Encounter (Signed)
-----   Message from Imogene Burn, PA-C sent at 09/06/2017  7:53 AM EST ----- Kidney function up a little. Continue low dose lisinopril and recheck bmet in 2 weeks when he comes in for coumadin check

## 2017-09-06 NOTE — Telephone Encounter (Signed)
Notes recorded by Teressa Senter, RN on 09/06/2017 at 8:12 AM EST Patient made aware of lab results. Patient instructed to continue lisinopril. Patient is scheduled for BMET on 09/19/17. Patient in agreement with plan and thankful for the call

## 2017-09-11 DIAGNOSIS — K921 Melena: Secondary | ICD-10-CM | POA: Diagnosis not present

## 2017-09-12 ENCOUNTER — Ambulatory Visit (INDEPENDENT_AMBULATORY_CARE_PROVIDER_SITE_OTHER): Payer: Medicare Other | Admitting: *Deleted

## 2017-09-12 ENCOUNTER — Encounter (INDEPENDENT_AMBULATORY_CARE_PROVIDER_SITE_OTHER): Payer: Self-pay

## 2017-09-12 DIAGNOSIS — I480 Paroxysmal atrial fibrillation: Secondary | ICD-10-CM | POA: Diagnosis not present

## 2017-09-12 DIAGNOSIS — Z5181 Encounter for therapeutic drug level monitoring: Secondary | ICD-10-CM

## 2017-09-12 DIAGNOSIS — Z7901 Long term (current) use of anticoagulants: Secondary | ICD-10-CM | POA: Diagnosis not present

## 2017-09-12 LAB — POCT INR: INR: 2.9

## 2017-09-12 NOTE — Patient Instructions (Signed)
Description   Today March 6th take coumadin 2.5mg  (1/2 tablet)  then continue same dose of  coumadin  5mg  (1 tablet) daily except 2.5mg  (1/2 tablet) on Sundays . Recheck INR in 1 week. Call with any questions new medications or if scheduled for any procedures [phone]

## 2017-09-19 ENCOUNTER — Ambulatory Visit (INDEPENDENT_AMBULATORY_CARE_PROVIDER_SITE_OTHER): Payer: Medicare Other | Admitting: *Deleted

## 2017-09-19 ENCOUNTER — Other Ambulatory Visit: Payer: Medicare Other

## 2017-09-19 DIAGNOSIS — I480 Paroxysmal atrial fibrillation: Secondary | ICD-10-CM | POA: Diagnosis not present

## 2017-09-19 DIAGNOSIS — Z7901 Long term (current) use of anticoagulants: Secondary | ICD-10-CM | POA: Diagnosis not present

## 2017-09-19 DIAGNOSIS — R7989 Other specified abnormal findings of blood chemistry: Secondary | ICD-10-CM | POA: Diagnosis not present

## 2017-09-19 LAB — BASIC METABOLIC PANEL
BUN / CREAT RATIO: 20 (ref 10–24)
BUN: 27 mg/dL (ref 8–27)
CHLORIDE: 103 mmol/L (ref 96–106)
CO2: 25 mmol/L (ref 20–29)
Calcium: 9.2 mg/dL (ref 8.6–10.2)
Creatinine, Ser: 1.35 mg/dL — ABNORMAL HIGH (ref 0.76–1.27)
GFR, EST AFRICAN AMERICAN: 55 mL/min/{1.73_m2} — AB (ref >59)
GFR, EST NON AFRICAN AMERICAN: 48 mL/min/{1.73_m2} — AB (ref >59)
Glucose: 99 mg/dL (ref 65–99)
POTASSIUM: 4.6 mmol/L (ref 3.5–5.2)
Sodium: 141 mmol/L (ref 134–144)

## 2017-09-19 LAB — POCT INR: INR: 2.1

## 2017-09-19 NOTE — Patient Instructions (Signed)
Description   Continue same dose of  coumadin  5mg  (1 tablet) daily except 2.5mg  (1/2 tablet) on Sundays . Recheck INR in 2 weeks. Call with any questions new medications or if scheduled for any procedures [phone]

## 2017-09-21 ENCOUNTER — Telehealth: Payer: Self-pay | Admitting: *Deleted

## 2017-09-21 DIAGNOSIS — Z79899 Other long term (current) drug therapy: Secondary | ICD-10-CM

## 2017-09-21 NOTE — Telephone Encounter (Signed)
-----   Message from Imogene Burn, PA-C sent at 09/20/2017  7:56 AM EDT ----- Kidney function about the same. Continue low dose lisinopril and repeat bmet in 1 month when he has his coumadin checked

## 2017-09-28 DIAGNOSIS — L821 Other seborrheic keratosis: Secondary | ICD-10-CM | POA: Diagnosis not present

## 2017-09-28 DIAGNOSIS — L570 Actinic keratosis: Secondary | ICD-10-CM | POA: Diagnosis not present

## 2017-09-28 DIAGNOSIS — C44619 Basal cell carcinoma of skin of left upper limb, including shoulder: Secondary | ICD-10-CM | POA: Diagnosis not present

## 2017-09-28 DIAGNOSIS — L218 Other seborrheic dermatitis: Secondary | ICD-10-CM | POA: Diagnosis not present

## 2017-09-28 DIAGNOSIS — D1801 Hemangioma of skin and subcutaneous tissue: Secondary | ICD-10-CM | POA: Diagnosis not present

## 2017-09-28 DIAGNOSIS — D485 Neoplasm of uncertain behavior of skin: Secondary | ICD-10-CM | POA: Diagnosis not present

## 2017-10-03 ENCOUNTER — Ambulatory Visit (INDEPENDENT_AMBULATORY_CARE_PROVIDER_SITE_OTHER): Payer: Medicare Other | Admitting: *Deleted

## 2017-10-03 ENCOUNTER — Other Ambulatory Visit: Payer: Medicare Other | Admitting: *Deleted

## 2017-10-03 DIAGNOSIS — I480 Paroxysmal atrial fibrillation: Secondary | ICD-10-CM

## 2017-10-03 DIAGNOSIS — Z5181 Encounter for therapeutic drug level monitoring: Secondary | ICD-10-CM

## 2017-10-03 DIAGNOSIS — Z79899 Other long term (current) drug therapy: Secondary | ICD-10-CM

## 2017-10-03 DIAGNOSIS — Z7901 Long term (current) use of anticoagulants: Secondary | ICD-10-CM

## 2017-10-03 LAB — POCT INR: INR: 3.3

## 2017-10-03 NOTE — Patient Instructions (Signed)
Description   Do not take coumadin today March 27th then continue same dose of  coumadin  5mg  (1 tablet) daily except 2.5mg  (1/2 tablet) on Sundays . Recheck INR in 2 weeks. Call with any questions new medications or if scheduled for any procedures [phone]

## 2017-10-04 LAB — BASIC METABOLIC PANEL
BUN/Creatinine Ratio: 21 (ref 10–24)
BUN: 30 mg/dL — ABNORMAL HIGH (ref 8–27)
CALCIUM: 9.3 mg/dL (ref 8.6–10.2)
CO2: 23 mmol/L (ref 20–29)
Chloride: 103 mmol/L (ref 96–106)
Creatinine, Ser: 1.43 mg/dL — ABNORMAL HIGH (ref 0.76–1.27)
GFR calc Af Amer: 51 mL/min/{1.73_m2} — ABNORMAL LOW (ref >59)
GFR calc non Af Amer: 44 mL/min/{1.73_m2} — ABNORMAL LOW (ref >59)
GLUCOSE: 86 mg/dL (ref 65–99)
POTASSIUM: 4.8 mmol/L (ref 3.5–5.2)
SODIUM: 140 mmol/L (ref 134–144)

## 2017-10-05 ENCOUNTER — Telehealth: Payer: Self-pay | Admitting: Cardiovascular Disease

## 2017-10-05 DIAGNOSIS — I2510 Atherosclerotic heart disease of native coronary artery without angina pectoris: Secondary | ICD-10-CM

## 2017-10-05 DIAGNOSIS — I10 Essential (primary) hypertension: Secondary | ICD-10-CM

## 2017-10-05 DIAGNOSIS — I255 Ischemic cardiomyopathy: Secondary | ICD-10-CM

## 2017-10-05 NOTE — Telephone Encounter (Signed)
New message  ° ° ° °Patient was returning call for lab results  °

## 2017-10-05 NOTE — Telephone Encounter (Signed)
I spoke with pt and gave him information below from Ermalinda Barrios, [place].  He will have BMET on 10/17/17 when here for Coumadin clinic appt   --------------------------------------------------------------------------------------------------  Notes recorded by Imogene Burn, PA-C on 10/04/2017 at 8:28 AM EDT Kidney function up some more. Will need to stop lisinopril. Recheck bmet in 2-4 weeks with next coumadin check.

## 2017-10-10 ENCOUNTER — Ambulatory Visit (INDEPENDENT_AMBULATORY_CARE_PROVIDER_SITE_OTHER): Payer: Medicare Other | Admitting: Physician Assistant

## 2017-10-10 ENCOUNTER — Telehealth: Payer: Self-pay

## 2017-10-10 ENCOUNTER — Encounter: Payer: Self-pay | Admitting: Physician Assistant

## 2017-10-10 VITALS — BP 130/60 | HR 60 | Ht 71.0 in | Wt 177.0 lb

## 2017-10-10 DIAGNOSIS — Z7901 Long term (current) use of anticoagulants: Secondary | ICD-10-CM | POA: Diagnosis not present

## 2017-10-10 DIAGNOSIS — K921 Melena: Secondary | ICD-10-CM

## 2017-10-10 DIAGNOSIS — R195 Other fecal abnormalities: Secondary | ICD-10-CM

## 2017-10-10 NOTE — Telephone Encounter (Signed)
-----   Message from Levin Erp, [place] sent at 10/10/2017  2:20 PM EDT -----   ----- Message ----- From: Irene Shipper, MD Sent: 10/10/2017  12:06 PM To: Levin Erp, PA    ----- Message ----- From: Levin Erp, [place] Sent: 10/10/2017  11:58 AM To: Irene Shipper, MD

## 2017-10-10 NOTE — Progress Notes (Signed)
Physician assistant office note reviewed. The patient is extraordinarily high risk at this point for optical colonoscopy. The value of Hemoccult testing in a patient on anticoagulation with complaints of rectal bleeding is none. I would recommend virtual colonoscopy as the next step. Please arrange Anderson Malta. He could follow-up with me thereafter.

## 2017-10-10 NOTE — Progress Notes (Signed)
Please arrange for virtual colonoscopy per Dr. Henrene Pastor.  Thank you-JLL

## 2017-10-10 NOTE — Progress Notes (Signed)
Chief Complaint: Occult blood positive stool, hematochezia  HPI:    [name] is an 84 year old Caucasian male with a past medical history of A. fib and CAD status post stent in January on Plavix and Coumadin, last EF 08/10/17 35-40%, who was referred to me by Marton Redwood, MD for a complaint of Hemoccult positive stools.      11/21/16-office visit for abdominal pain, gas and ileus.  Seen by me and assigned to Dr. Silverio Decamp.    Per referring physician's notes patient is requesting to be seen by Dr. Henrene Pastor.  09/11/17 Hemoccult positive x3.  Labs 08/28/17 hemoglobin normal at 14.5, CMP normal.    Today, patient is a very poor historian, he explains that about a month ago he was seen some bright red blood when wiping after a BM for 3-4 days, but it was when he was on Coumadin, Plavix and aspirin.  He had Hemoccult studies at that time which were positive and was told to follow with [place].  Since then he has been taken off of his aspirin has seen no further blood in his stools.  Per referring physician, worried regarding patient's history of no previous colonoscopy.    Denies fever, chills, weight loss, anorexia, nausea, vomiting, abdominal or rectal pain.  Past Medical History:  Diagnosis Date  . A-fib ([place])   . Arthritis   . BPH (benign prostatic hyperplasia)   . Chest pain, unspecified   . Chronic kidney disease   . Coronary atherosclerosis of unspecified type of vessel, native or graft   . Dizziness and giddiness   . Hyperlipidemia   . Hypertension   . Myocardial infarction ([place])   . Peripheral neuropathy   . PVD (peripheral vascular disease) ([place])   . Shortness of breath    exertion  . Vitamin B12 deficiency     Past Surgical History:  Procedure Laterality Date  . CORONARY ANGIOPLASTY     4 stents  . CORONARY STENT INTERVENTION N/A 08/09/2017   Procedure: CORONARY STENT INTERVENTION;  Surgeon: Nelva Bush, MD;  Location: [facility];  Service: Cardiovascular;  Laterality:  N/A;  . INGUINAL HERNIA REPAIR  06/14/2012   Procedure: HERNIA REPAIR INGUINAL ADULT;  Surgeon: Odis Hollingshead, MD;  Location: [facility];  Service: General;  Laterality: Right;  . INSERTION OF MESH  06/14/2012   Procedure: INSERTION OF MESH;  Surgeon: Odis Hollingshead, MD;  Location: [facility];  Service: General;  Laterality: Right;  . LEFT HEART CATH AND CORONARY ANGIOGRAPHY N/A 08/09/2017   Procedure: LEFT HEART CATH AND CORONARY ANGIOGRAPHY;  Surgeon: Nelva Bush, MD;  Location: [facility];  Service: Cardiovascular;  Laterality: N/A;  . SINUS SURGERY WITH INSTATRAK  1985    Current Outpatient Medications  Medication Sig Dispense Refill  . atorvastatin (LIPITOR) 40 MG tablet Take 1 tablet (40 mg total) by mouth daily at 6 PM. 90 tablet 0  . clopidogrel (PLAVIX) 75 MG tablet TAKE ONE TABLET EACH DAY 90 tablet 3  . metoprolol succinate (TOPROL-XL) 25 MG 24 hr tablet TAKE 3 TABLETS DAILY 90 tablet 3  . warfarin (COUMADIN) 5 MG tablet Take as directed by coumadin clinic 30 tablet 1   No current facility-administered medications for this visit.     Allergies as of 10/10/2017 - Review Complete 10/10/2017  Allergen Reaction Noted  . Gadolinium derivatives Hives and Itching 02/14/2017    Family History  Problem Relation Age of Onset  . Heart attack Father 78  .  Arthritis Sister   . Hypothyroidism Daughter     Social History   Socioeconomic History  . Marital status: Divorced    Spouse name: Not on file  . Number of children: 3  . Years of education: Not on file  . Highest education level: Not on file  Occupational History  . Occupation: Magazine features editor    Comment: [employer]  Social Needs  . Financial resource strain: Not on file  . Food insecurity:    Worry: Not on file    Inability: Not on file  . Transportation needs:    Medical: Not on file    Non-medical: Not on file  Tobacco Use  . Smoking status: Former Smoker    Last attempt to quit:  07/10/1981    Years since quitting: 36.2  . Smokeless tobacco: Never Used  Substance and Sexual Activity  . Alcohol use: No  . Drug use: No  . Sexual activity: Not on file  Lifestyle  . Physical activity:    Days per week: Not on file    Minutes per session: Not on file  . Stress: Not on file  Relationships  . Social connections:    Talks on phone: Not on file    Gets together: Not on file    Attends religious service: Not on file    Active member of club or organization: Not on file    Attends meetings of clubs or organizations: Not on file    Relationship status: Not on file  . Intimate partner violence:    Fear of current or ex partner: Not on file    Emotionally abused: Not on file    Physically abused: Not on file    Forced sexual activity: Not on file  Other Topics Concern  . Not on file  Social History Narrative   Regular exercise          Review of Systems:    Constitutional: No weight loss, fever or chills Cardiovascular: No chest pain Respiratory: No SOB Gastrointestinal: See HPI and otherwise negative   Physical Exam:  Vital signs: BP 130/60   Pulse 60   Ht 5\' 11"  (1.803 m)   Wt 177 lb (80.3 kg)   BMI 24.69 kg/m   Constitutional:   Pleasant Elderly Caucasian male appears to be in NAD, Well developed, Well nourished, alert and cooperative Respiratory: Respirations even and unlabored. Lungs clear to auscultation bilaterally.   No wheezes, crackles, or rhonchi.  Cardiovascular: Normal S1, S2. No MRG. Regular rate and rhythm. No peripheral edema, cyanosis or pallor.  Gastrointestinal:  Soft, nondistended, nontender. No rebound or guarding. Normal bowel sounds. No appreciable masses or hepatomegaly. Psychiatric:  Demonstrates good judgement and reason without abnormal affect or behaviors.  See HPI for recent labs.  Assessment: 1.  Hemoccult positive stools: During episodes below 2.  Hematochezia: Occurred for 3-4 days, bright red blood on toilet paper,  none since stopping aspirin over the past month, no previous colonoscopy 3.  Chronic anticoagulation: With Plavix and Coumadin 4.  CAD with recent stenting: In January, also CHF with LVEF in February 35-40%  Plan: 1.  Patient would be very high risk for any endoscopic procedures at this time given his history of CAD and recent stenting in January as well as CHF with an LVEF in February 35-40% as well as need for chronic anticoagulation with Plavix and Coumadin.  Patient is requesting to switch to Dr. Henrene Pastor, I will see if Dr. Henrene Pastor is  accepting new patients and will then let him or Dr. Silverio Decamp decide what they would like to do given patient's recent Hemoccult positive stools. 2.  Agree with holding aspirin per previous physician's recommendations.  He should continue other anticoagulation as directed. 3.  Patient to follow with [place] as recommended after discussion as above.  Ellouise Newer, PA-C [facility] 10/10/2017, 11:49 AM  Cc: Marton Redwood, MD

## 2017-10-10 NOTE — Telephone Encounter (Signed)
Physician assistant office note reviewed. The patient is extraordinarily high risk at this point for optical colonoscopy. The value of Hemoccult testing in a patient on anticoagulation with complaints of rectal bleeding is none. I would recommend virtual colonoscopy as the next step. Please arrange Anderson Malta. He could follow-up with me thereafter.

## 2017-10-11 NOTE — Telephone Encounter (Signed)
Message left for pt to return call and discuss virtual colon

## 2017-10-15 NOTE — Telephone Encounter (Signed)
Left message on machine to call back  

## 2017-10-15 NOTE — Telephone Encounter (Signed)
10/26/17 930 am arrive 9:10 am at [address] pick up prep at least 4 days prior.  Monday - Friday 8:30-4 pm.  Photo ID and Insurance card.    Left message on machine to call back

## 2017-10-15 NOTE — Telephone Encounter (Signed)
The patient has been notified of this information and all questions answered.

## 2017-10-17 ENCOUNTER — Other Ambulatory Visit: Payer: Medicare Other | Admitting: *Deleted

## 2017-10-17 ENCOUNTER — Ambulatory Visit (INDEPENDENT_AMBULATORY_CARE_PROVIDER_SITE_OTHER): Payer: Medicare Other | Admitting: *Deleted

## 2017-10-17 DIAGNOSIS — I480 Paroxysmal atrial fibrillation: Secondary | ICD-10-CM | POA: Diagnosis not present

## 2017-10-17 DIAGNOSIS — I255 Ischemic cardiomyopathy: Secondary | ICD-10-CM

## 2017-10-17 DIAGNOSIS — I10 Essential (primary) hypertension: Secondary | ICD-10-CM | POA: Diagnosis not present

## 2017-10-17 DIAGNOSIS — Z7901 Long term (current) use of anticoagulants: Secondary | ICD-10-CM | POA: Diagnosis not present

## 2017-10-17 DIAGNOSIS — I2510 Atherosclerotic heart disease of native coronary artery without angina pectoris: Secondary | ICD-10-CM | POA: Diagnosis not present

## 2017-10-17 LAB — BASIC METABOLIC PANEL
BUN/Creatinine Ratio: 19 (ref 10–24)
BUN: 26 mg/dL (ref 8–27)
CALCIUM: 9.1 mg/dL (ref 8.6–10.2)
CHLORIDE: 102 mmol/L (ref 96–106)
CO2: 26 mmol/L (ref 20–29)
Creatinine, Ser: 1.35 mg/dL — ABNORMAL HIGH (ref 0.76–1.27)
GFR calc Af Amer: 55 mL/min/{1.73_m2} — ABNORMAL LOW (ref >59)
GFR calc non Af Amer: 48 mL/min/{1.73_m2} — ABNORMAL LOW (ref >59)
GLUCOSE: 96 mg/dL (ref 65–99)
Potassium: 4.9 mmol/L (ref 3.5–5.2)
Sodium: 140 mmol/L (ref 134–144)

## 2017-10-17 LAB — POCT INR: INR: 2.6

## 2017-10-17 NOTE — Patient Instructions (Signed)
Description   Continue same dose of  coumadin  5mg  (1 tablet) daily except 2.5mg  (1/2 tablet) on Sundays . Recheck INR in 3 weeks. Call with any questions new medications or if scheduled for any procedures [phone]

## 2017-10-26 ENCOUNTER — Ambulatory Visit
Admission: RE | Admit: 2017-10-26 | Discharge: 2017-10-26 | Disposition: A | Discharge status: Home / Self Care | Payer: Medicare Other | Source: Ambulatory Visit | Attending: Physician Assistant | Admitting: Physician Assistant

## 2017-10-26 DIAGNOSIS — K921 Melena: Secondary | ICD-10-CM

## 2017-10-26 DIAGNOSIS — K5730 Diverticulosis of large intestine without perforation or abscess without bleeding: Secondary | ICD-10-CM | POA: Diagnosis not present

## 2017-10-30 ENCOUNTER — Telehealth: Payer: Self-pay | Admitting: Physician Assistant

## 2017-10-30 ENCOUNTER — Telehealth: Payer: Self-pay | Admitting: Pharmacist

## 2017-10-30 ENCOUNTER — Telehealth: Payer: Self-pay

## 2017-10-30 NOTE — Telephone Encounter (Signed)
Pt scheduled to see Dr. Henrene Pastor tomorrow at 3pm. Daughter aware of appt and will call back if there is an issue with the appt.

## 2017-10-30 NOTE — Telephone Encounter (Signed)
-----   Message from Irene Shipper, MD sent at 10/30/2017  3:01 PM EDT ----- Regarding: FW: Abnormal Virtual Colonoscopy Vaughan Basta, This patient was seen by Anderson Malta. May have colon cancer.Complicated medical history. His daughter Ferne Reus [phone] wanted to discuss his case. I think it would be best if she come into the office WITH Mr. Dom. I can discuss all aspects of his care in detail with the 2 of them. You can put him on my schedule tomorrow or next week some time. If I am full, move a new patient to see one of the extenders and put this patient in that slot. Thanks jp  ----- Message ----- From: Levin Erp, PA Sent: 10/30/2017   2:08 PM To: Irene Shipper, MD Subject: Abnormal Virtual Colonoscopy                   Dr. Henrene Pastor,  This is the patient we spoke about.   Thanks-JLL

## 2017-10-30 NOTE — Telephone Encounter (Signed)
1:30 10/30/17 Spoke with patient's daughter who is his POA Enid Derry in regards to virtual colonoscopy showing apple core lesion suspected to be colon cancer.  She has many concerns in regards to holding patient's Coumadin which has been hard for them to correct as well as effects from possible colon surgery if needed.  Apparently patient is being admitted into the [facility] on May 3 and daughter essentially would like to make sure that he is going to be able to be there.  She also would like [place] to speak with Dr. Angelena Form and ensure that it is safe for patient to hold his Coumadin.  She requested to speak with Dr. Henrene Pastor in regards to this finding and his recommendations.  Did attempt to answer her questions but she does respectfully ask to speak with Dr. Henrene Pastor.  Spoke with Dr. Henrene Pastor and gave him phone number.  He will try to call today.  Ellouise Newer, PA-C

## 2017-10-30 NOTE — Telephone Encounter (Signed)
Tried to call patient in regards to CT results, no answer at home x2 or cell provided. LMOM for him to call back. Will have nursing staff try to get in touch with him throughout the afternoon.   Ellouise Newer, PA-C

## 2017-10-30 NOTE — Telephone Encounter (Signed)
   [facility] [address] [place], [place]  [address] Phone:  [phone]   Fax:  [phone]   [facility] Pre-operative Risk Assessment     Request for surgical clearance:     Endoscopy Procedure  What type of surgery is being performed?     FLEXIBLE SIGMOIDOSCOPY  When is this surgery scheduled?     11/01/17  What type of clearance is required ?   Pharmacy  Are there any medications that need to be held prior to surgery and how long? COUMADIN FROM 10/30/17  Practice name and name of physician performing surgery?      [facility]  What is your office phone and fax number?      Phone- [phone]  [phone]  Anesthesia type (None, local, MAC, general) ?       MAC

## 2017-10-30 NOTE — Telephone Encounter (Signed)
Patient with diagnosis of Afib on warfarin for anticoagulation.    Procedure: sigmoidoscopy Date of procedure: 11/01/17  CHADS2-VASc score of  5 (CHF, HTN, AGE, DM2, stroke/tia x 2, CAD, AGE, male)  Per office protocol, patient can hold warfarin for 5 days prior to procedure.

## 2017-10-31 ENCOUNTER — Ambulatory Visit (INDEPENDENT_AMBULATORY_CARE_PROVIDER_SITE_OTHER): Payer: Medicare Other | Admitting: Internal Medicine

## 2017-10-31 ENCOUNTER — Encounter: Payer: Self-pay | Admitting: Internal Medicine

## 2017-10-31 VITALS — BP 124/80 | HR 68 | Ht 71.0 in | Wt 174.0 lb

## 2017-10-31 DIAGNOSIS — I255 Ischemic cardiomyopathy: Secondary | ICD-10-CM

## 2017-10-31 DIAGNOSIS — Z7901 Long term (current) use of anticoagulants: Secondary | ICD-10-CM

## 2017-10-31 DIAGNOSIS — R195 Other fecal abnormalities: Secondary | ICD-10-CM

## 2017-10-31 DIAGNOSIS — K6389 Other specified diseases of intestine: Secondary | ICD-10-CM

## 2017-10-31 DIAGNOSIS — K639 Disease of intestine, unspecified: Secondary | ICD-10-CM | POA: Diagnosis not present

## 2017-10-31 DIAGNOSIS — R935 Abnormal findings on diagnostic imaging of other abdominal regions, including retroperitoneum: Secondary | ICD-10-CM

## 2017-10-31 NOTE — Patient Instructions (Signed)
[name] will call you regarding rescheduling your procedure

## 2017-10-31 NOTE — Progress Notes (Signed)
HISTORY OF PRESENT ILLNESS:  [name] is a 84 y.o. male with multiple significant medical problems as listed below including congestive heart failure with ejection fraction 35-40%, atrial fibrillation on Coumadin, recent myocardial infarction and subsequent intervention with stent placement on aspirin and Plavix. Recently seen by cardiology and doing well. Taken off aspirin. Since a GI and seen by the GI physician assistant 10/10/2017 for Hemoccult-positive stool and several days of hematochezia. Given his comorbidities and anticoagulation status he was sent for virtual colonoscopy. Exam was completed 10/26/2017. Examination revealed an pple core lesion within the mid to distal sigmoid colon concerning for colon cancer. This extended over 2.5 cm in length. Also colonic diverticulosis. I was recommending hospital admission with sigmoidoscopy and biopsies and if cancer cardiology and surgical evaluation's. However, the patient had scheduling conflicts and his daughter had multiple questions that she wanted to discuss. Thus, I arranged this office evaluation today. The patient is an of further bleeding. I reviewed with him the imaging studies. They indeed had multiple appropriate questions. Recent outpatient cardiology office visit reviewed. The patient has the on her being inducted into the [facility] next week. The patient denies recurrent bleeding. He does notice change in stool caliber (thinner). No abdominal pain.  REVIEW OF SYSTEMS:  All non-GI ROS negative except for confusion, fatigue, muscle cramps, leg swelling, excessive urination, urinary frequency, urinary leakage  Past Medical History:  Diagnosis Date  . A-fib ([place])   . Arthritis   . BPH (benign prostatic hyperplasia)   . Chest pain, unspecified   . Chronic kidney disease   . Coronary atherosclerosis of unspecified type of vessel, native or graft   . Dizziness and giddiness   . Hyperlipidemia   .  Hypertension   . Myocardial infarction ([place])   . Peripheral neuropathy   . PVD (peripheral vascular disease) ([place])   . Shortness of breath    exertion  . Vitamin B12 deficiency     Past Surgical History:  Procedure Laterality Date  . CORONARY ANGIOPLASTY     4 stents  . CORONARY STENT INTERVENTION N/A 08/09/2017   Procedure: CORONARY STENT INTERVENTION;  Surgeon: Nelva Bush, MD;  Location: [facility];  Service: Cardiovascular;  Laterality: N/A;  . INGUINAL HERNIA REPAIR  06/14/2012   Procedure: HERNIA REPAIR INGUINAL ADULT;  Surgeon: Odis Hollingshead, MD;  Location: [facility];  Service: General;  Laterality: Right;  . INSERTION OF MESH  06/14/2012   Procedure: INSERTION OF MESH;  Surgeon: Odis Hollingshead, MD;  Location: [facility];  Service: General;  Laterality: Right;  . LEFT HEART CATH AND CORONARY ANGIOGRAPHY N/A 08/09/2017   Procedure: LEFT HEART CATH AND CORONARY ANGIOGRAPHY;  Surgeon: Nelva Bush, MD;  Location: [facility];  Service: Cardiovascular;  Laterality: N/A;  . [place]  reports that he quit smoking about 36 years ago. He has never used smokeless tobacco. He reports that he does not drink alcohol or use drugs.  family history includes Arthritis in his sister; Heart attack (age of onset: 33) in his father; Hypothyroidism in his daughter.  Allergies  Allergen Reactions  . Gadolinium Derivatives Hives, Itching and Other (See Comments)    Dr. Jeralyn Ruths s/w [name]. We observed him for 15 minutes. He did not need to take Benadryl. The symptoms began to subside before he left.        PHYSICAL EXAMINATION:  Vital signs: BP 124/80   Pulse 68   Ht 5\' 11"  (1.803 m)   Wt 174 lb (78.9 kg)   BMI 24.27 kg/m   Constitutional: generally well-appearing, no acute distress Psychiatric: alert and oriented x3, cooperative Eyes: extraocular movements intact, anicteric, conjunctiva pink Mouth: oral  pharynx moist, no lesions. Missing teeth Neck: supple no lymphadenopathy Cardiovascular: heart regular rate and rhythm, no murmur Lungs: clear to auscultation bilaterally Abdomen: soft, nontender, nondistended, no obvious ascites, no peritoneal signs, normal bowel sounds, no organomegaly Rectal:omitted Extremities: no clubbing, cyanosis, or lower extremity edema bilaterally Skin: no lesions on visible extremities Neuro: No focal deficits.   ASSESSMENT:  #1. Abnormal virtual colonoscopy concerning for nonobstructing sigmoid colon cancer #2. Problems with rectal bleeding without recurrence #3. Hemoccult-positive stool #4. Advanced age and multiple cardiac issues including no anticoagulation and antiplatelet therapy   PLAN:  #1. Schedule outpatient flexible sigmoidoscopy with biopsies. The patient is high risk. This will need to be performed at the hospital #2. Confer with cardiology if we may hold Coumadin for 3 days. Of course we would continue his Plavix #3. If cancer confirmed he will need surgical evaluation with cardiology input  25 minutes spent face-to-face with the patient. Greater than 50% a time use for counseling regarding his issues with rectal bleeding and colon mass

## 2017-10-31 NOTE — H&P (View-Only) (Signed)
HISTORY OF PRESENT ILLNESS:  [name] is a 84 y.o. male with multiple significant medical problems as listed below including congestive heart failure with ejection fraction 35-40%, atrial fibrillation on Coumadin, recent myocardial infarction and subsequent intervention with stent placement on aspirin and Plavix. Recently seen by cardiology and doing well. Taken off aspirin. Since a GI and seen by the GI physician assistant 10/10/2017 for Hemoccult-positive stool and several days of hematochezia. Given his comorbidities and anticoagulation status he was sent for virtual colonoscopy. Exam was completed 10/26/2017. Examination revealed an pple core lesion within the mid to distal sigmoid colon concerning for colon cancer. This extended over 2.5 cm in length. Also colonic diverticulosis. I was recommending hospital admission with sigmoidoscopy and biopsies and if cancer cardiology and surgical evaluation's. However, the patient had scheduling conflicts and his daughter had multiple questions that she wanted to discuss. Thus, I arranged this office evaluation today. The patient is an of further bleeding. I reviewed with him the imaging studies. They indeed had multiple appropriate questions. Recent outpatient cardiology office visit reviewed. The patient has the on her being inducted into the [facility] next week. The patient denies recurrent bleeding. He does notice change in stool caliber (thinner). No abdominal pain.  REVIEW OF SYSTEMS:  All non-GI ROS negative except for confusion, fatigue, muscle cramps, leg swelling, excessive urination, urinary frequency, urinary leakage  Past Medical History:  Diagnosis Date  . A-fib ([place])   . Arthritis   . BPH (benign prostatic hyperplasia)   . Chest pain, unspecified   . Chronic kidney disease   . Coronary atherosclerosis of unspecified type of vessel, native or graft   . Dizziness and giddiness   . Hyperlipidemia   .  Hypertension   . Myocardial infarction ([place])   . Peripheral neuropathy   . PVD (peripheral vascular disease) ([place])   . Shortness of breath    exertion  . Vitamin B12 deficiency     Past Surgical History:  Procedure Laterality Date  . CORONARY ANGIOPLASTY     4 stents  . CORONARY STENT INTERVENTION N/A 08/09/2017   Procedure: CORONARY STENT INTERVENTION;  Surgeon: Nelva Bush, MD;  Location: [facility];  Service: Cardiovascular;  Laterality: N/A;  . INGUINAL HERNIA REPAIR  06/14/2012   Procedure: HERNIA REPAIR INGUINAL ADULT;  Surgeon: Odis Hollingshead, MD;  Location: [facility];  Service: General;  Laterality: Right;  . INSERTION OF MESH  06/14/2012   Procedure: INSERTION OF MESH;  Surgeon: Odis Hollingshead, MD;  Location: [facility];  Service: General;  Laterality: Right;  . LEFT HEART CATH AND CORONARY ANGIOGRAPHY N/A 08/09/2017   Procedure: LEFT HEART CATH AND CORONARY ANGIOGRAPHY;  Surgeon: Nelva Bush, MD;  Location: [facility];  Service: Cardiovascular;  Laterality: N/A;  . [place]  reports that he quit smoking about 36 years ago. He has never used smokeless tobacco. He reports that he does not drink alcohol or use drugs.  family history includes Arthritis in his sister; Heart attack (age of onset: 37) in his father; Hypothyroidism in his daughter.  Allergies  Allergen Reactions  . Gadolinium Derivatives Hives, Itching and Other (See Comments)    Dr. Jeralyn Ruths s/w Mr. Birkeland. We observed him for 15 minutes. He did not need to take Benadryl. The symptoms began to subside before he left.        PHYSICAL EXAMINATION:  Vital signs: BP 124/80   Pulse 68   Ht 5\' 11"  (1.803 m)   Wt 174 lb (78.9 kg)   BMI 24.27 kg/m   Constitutional: generally well-appearing, no acute distress Psychiatric: alert and oriented x3, cooperative Eyes: extraocular movements intact, anicteric, conjunctiva pink Mouth: oral  pharynx moist, no lesions. Missing teeth Neck: supple no lymphadenopathy Cardiovascular: heart regular rate and rhythm, no murmur Lungs: clear to auscultation bilaterally Abdomen: soft, nontender, nondistended, no obvious ascites, no peritoneal signs, normal bowel sounds, no organomegaly Rectal:omitted Extremities: no clubbing, cyanosis, or lower extremity edema bilaterally Skin: no lesions on visible extremities Neuro: No focal deficits.   ASSESSMENT:  #1. Abnormal virtual colonoscopy concerning for nonobstructing sigmoid colon cancer #2. Problems with rectal bleeding without recurrence #3. Hemoccult-positive stool #4. Advanced age and multiple cardiac issues including no anticoagulation and antiplatelet therapy   PLAN:  #1. Schedule outpatient flexible sigmoidoscopy with biopsies. The patient is high risk. This will need to be performed at the hospital #2. Confer with cardiology if we may hold Coumadin for 3 days. Of course we would continue his Plavix #3. If cancer confirmed he will need surgical evaluation with cardiology input  25 minutes spent face-to-face with the patient. Greater than 50% a time use for counseling regarding his issues with rectal bleeding and colon mass

## 2017-11-01 ENCOUNTER — Other Ambulatory Visit: Payer: Self-pay

## 2017-11-01 ENCOUNTER — Encounter (HOSPITAL_COMMUNITY): Admission: RE | Payer: Self-pay | Source: Ambulatory Visit

## 2017-11-01 ENCOUNTER — Ambulatory Visit (HOSPITAL_COMMUNITY): Admission: RE | Admit: 2017-11-01 | Payer: Medicare Other | Source: Ambulatory Visit | Admitting: Internal Medicine

## 2017-11-01 DIAGNOSIS — K6389 Other specified diseases of intestine: Secondary | ICD-10-CM

## 2017-11-01 SURGERY — SIGMOIDOSCOPY, FLEXIBLE
Anesthesia: Monitor Anesthesia Care

## 2017-11-06 ENCOUNTER — Encounter: Payer: Self-pay | Admitting: Cardiovascular Disease

## 2017-11-07 ENCOUNTER — Ambulatory Visit (INDEPENDENT_AMBULATORY_CARE_PROVIDER_SITE_OTHER): Payer: Medicare Other | Admitting: *Deleted

## 2017-11-07 DIAGNOSIS — I480 Paroxysmal atrial fibrillation: Secondary | ICD-10-CM

## 2017-11-07 DIAGNOSIS — Z7901 Long term (current) use of anticoagulants: Secondary | ICD-10-CM | POA: Diagnosis not present

## 2017-11-07 LAB — POCT INR: INR: 2.5

## 2017-11-07 NOTE — Patient Instructions (Addendum)
When you resume Coumadin take an extra half tablet for two days. So if you restart on Monday take 7.5mg  and Tuesday take 7.5mg  then resume taking 5mg  daily except 2.5mg  on Sundays. Call with any questions [phone] Description   Start holding for Colonoscopy on Monday 11/12/17.   Normal dose coumadin 5mg  (1 tablet) daily except 2.5mg  (1/2 tablet) on Sundays . Recheck INR in 1 week after procedure. Call with any questions new medications or if scheduled for any procedures [phone]

## 2017-11-12 ENCOUNTER — Ambulatory Visit (HOSPITAL_COMMUNITY)
Admission: RE | Admit: 2017-11-12 | Discharge: 2017-11-12 | Disposition: A | Discharge status: Home / Self Care | Payer: Medicare Other | Source: Ambulatory Visit | Attending: Internal Medicine | Admitting: Internal Medicine

## 2017-11-12 ENCOUNTER — Telehealth: Payer: Self-pay | Admitting: Internal Medicine

## 2017-11-12 ENCOUNTER — Encounter (HOSPITAL_COMMUNITY)
Admission: RE | Disposition: A | Discharge status: Home / Self Care | Payer: Self-pay | Source: Ambulatory Visit | Attending: Internal Medicine

## 2017-11-12 ENCOUNTER — Other Ambulatory Visit: Payer: Self-pay

## 2017-11-12 ENCOUNTER — Telehealth: Payer: Self-pay

## 2017-11-12 ENCOUNTER — Encounter (HOSPITAL_COMMUNITY): Payer: Self-pay

## 2017-11-12 DIAGNOSIS — I2510 Atherosclerotic heart disease of native coronary artery without angina pectoris: Secondary | ICD-10-CM | POA: Diagnosis not present

## 2017-11-12 DIAGNOSIS — K6389 Other specified diseases of intestine: Secondary | ICD-10-CM

## 2017-11-12 DIAGNOSIS — G629 Polyneuropathy, unspecified: Secondary | ICD-10-CM | POA: Diagnosis not present

## 2017-11-12 DIAGNOSIS — I739 Peripheral vascular disease, unspecified: Secondary | ICD-10-CM | POA: Diagnosis not present

## 2017-11-12 DIAGNOSIS — Z7982 Long term (current) use of aspirin: Secondary | ICD-10-CM | POA: Insufficient documentation

## 2017-11-12 DIAGNOSIS — Z7901 Long term (current) use of anticoagulants: Secondary | ICD-10-CM | POA: Insufficient documentation

## 2017-11-12 DIAGNOSIS — N189 Chronic kidney disease, unspecified: Secondary | ICD-10-CM | POA: Diagnosis not present

## 2017-11-12 DIAGNOSIS — Z87891 Personal history of nicotine dependence: Secondary | ICD-10-CM | POA: Insufficient documentation

## 2017-11-12 DIAGNOSIS — K5730 Diverticulosis of large intestine without perforation or abscess without bleeding: Secondary | ICD-10-CM | POA: Diagnosis not present

## 2017-11-12 DIAGNOSIS — E785 Hyperlipidemia, unspecified: Secondary | ICD-10-CM | POA: Diagnosis not present

## 2017-11-12 DIAGNOSIS — I509 Heart failure, unspecified: Secondary | ICD-10-CM | POA: Diagnosis not present

## 2017-11-12 DIAGNOSIS — K921 Melena: Secondary | ICD-10-CM | POA: Diagnosis present

## 2017-11-12 DIAGNOSIS — C187 Malignant neoplasm of sigmoid colon: Secondary | ICD-10-CM

## 2017-11-12 DIAGNOSIS — K625 Hemorrhage of anus and rectum: Secondary | ICD-10-CM

## 2017-11-12 DIAGNOSIS — Z955 Presence of coronary angioplasty implant and graft: Secondary | ICD-10-CM | POA: Diagnosis not present

## 2017-11-12 DIAGNOSIS — I130 Hypertensive heart and chronic kidney disease with heart failure and stage 1 through stage 4 chronic kidney disease, or unspecified chronic kidney disease: Secondary | ICD-10-CM | POA: Diagnosis not present

## 2017-11-12 DIAGNOSIS — K5669 Other partial intestinal obstruction: Secondary | ICD-10-CM | POA: Diagnosis not present

## 2017-11-12 DIAGNOSIS — I4891 Unspecified atrial fibrillation: Secondary | ICD-10-CM | POA: Insufficient documentation

## 2017-11-12 DIAGNOSIS — I252 Old myocardial infarction: Secondary | ICD-10-CM | POA: Insufficient documentation

## 2017-11-12 DIAGNOSIS — Z7902 Long term (current) use of antithrombotics/antiplatelets: Secondary | ICD-10-CM | POA: Diagnosis not present

## 2017-11-12 HISTORY — PX: FLEXIBLE SIGMOIDOSCOPY: SHX5431

## 2017-11-12 SURGERY — SIGMOIDOSCOPY, FLEXIBLE
Anesthesia: Moderate Sedation

## 2017-11-12 MED ORDER — MIDAZOLAM HCL 10 MG/2ML IJ SOLN
INTRAMUSCULAR | Status: DC | PRN
  Administered 2017-11-12 (×2): 1 mg via INTRAVENOUS

## 2017-11-12 MED ORDER — MIDAZOLAM HCL 5 MG/ML IJ SOLN
INTRAMUSCULAR | Status: AC
  Filled 2017-11-12: qty 2

## 2017-11-12 MED ORDER — SODIUM CHLORIDE 0.9 % IV SOLN
INTRAVENOUS | Status: DC
  Administered 2017-11-12: 500 mL via INTRAVENOUS

## 2017-11-12 MED ORDER — FENTANYL CITRATE (PF) 100 MCG/2ML IJ SOLN
INTRAMUSCULAR | Status: AC
  Filled 2017-11-12: qty 2

## 2017-11-12 MED ORDER — SPOT INK MARKER SYRINGE KIT
PACK | SUBMUCOSAL | Status: DC | PRN
  Administered 2017-11-12: 5 mL via SUBMUCOSAL

## 2017-11-12 MED ORDER — FENTANYL CITRATE (PF) 100 MCG/2ML IJ SOLN
INTRAMUSCULAR | Status: DC | PRN
  Administered 2017-11-12: 25 ug via INTRAVENOUS

## 2017-11-12 MED ORDER — SPOT INK MARKER SYRINGE KIT
PACK | SUBMUCOSAL | Status: AC
  Filled 2017-11-12: qty 5

## 2017-11-12 NOTE — Telephone Encounter (Signed)
Opened in error

## 2017-11-12 NOTE — H&P (Signed)
HISTORY OF PRESENT ILLNESS:  [name] is a 84 y.o. male with multiple significant medical problems as listed below including congestive heart failure with ejection fraction 35-40%, atrial fibrillation on Coumadin, recent myocardial infarction and subsequent intervention with stent placement on aspirin and Plavix. Recently seen by cardiology and doing well. Taken off aspirin. Since a GI and seen by the GI physician assistant 10/10/2017 for Hemoccult-positive stool and several days of hematochezia. Given his comorbidities and anticoagulation status he was sent for virtual colonoscopy. Exam was completed 10/26/2017. Examination revealed an pple core lesion within the mid to distal sigmoid colon concerning for colon cancer. This extended over 2.5 cm in length. Also colonic diverticulosis. I was recommending hospital admission with sigmoidoscopy and biopsies and if cancer cardiology and surgical evaluation's. However, the patient had scheduling conflicts and his daughter had multiple questions that she wanted to discuss. Thus, I arranged this office evaluation today. The patient is an of further bleeding. I reviewed with him the imaging studies. They indeed had multiple appropriate questions. Recent outpatient cardiology office visit reviewed. The patient has the on her being inducted into the [employer] next week. The patient denies recurrent bleeding. He does notice change in stool caliber (thinner). No abdominal pain.  REVIEW OF SYSTEMS:  All non-GI ROS negative except for confusion, fatigue, muscle cramps, leg swelling, excessive urination, urinary frequency, urinary leakage      Past Medical History:  Diagnosis Date  . A-fib ([place])   . Arthritis   . BPH (benign prostatic hyperplasia)   . Chest pain, unspecified   . Chronic kidney disease   . Coronary atherosclerosis of unspecified type of vessel, native or graft   . Dizziness and giddiness   . Hyperlipidemia    . Hypertension   . Myocardial infarction ([place])   . Peripheral neuropathy   . PVD (peripheral vascular disease) ([place])   . Shortness of breath    exertion  . Vitamin B12 deficiency          Past Surgical History:  Procedure Laterality Date  . CORONARY ANGIOPLASTY     4 stents  . CORONARY STENT INTERVENTION N/A 08/09/2017   Procedure: CORONARY STENT INTERVENTION;  Surgeon: Nelva Bush, MD;  Location: [facility];  Service: Cardiovascular;  Laterality: N/A;  . INGUINAL HERNIA REPAIR  06/14/2012   Procedure: HERNIA REPAIR INGUINAL ADULT;  Surgeon: Odis Hollingshead, MD;  Location: [facility];  Service: General;  Laterality: Right;  . INSERTION OF MESH  06/14/2012   Procedure: INSERTION OF MESH;  Surgeon: Odis Hollingshead, MD;  Location: [facility];  Service: General;  Laterality: Right;  . LEFT HEART CATH AND CORONARY ANGIOGRAPHY N/A 08/09/2017   Procedure: LEFT HEART CATH AND CORONARY ANGIOGRAPHY;  Surgeon: Nelva Bush, MD;  Location: [facility];  Service: Cardiovascular;  Laterality: N/A;  . Bensley  reports that he quit smoking about 36 years ago. He has never used smokeless tobacco. He reports that he does not drink alcohol or use drugs.  family history includes Arthritis in his sister; Heart attack (age of onset: 70) in his father; Hypothyroidism in his daughter.       Allergies  Allergen Reactions  . Gadolinium Derivatives Hives, Itching and Other (See Comments)    Dr. Jeralyn Ruths s/w Mr. Sharps. We observed him for 15 minutes. He did not need to take Benadryl. The symptoms began  to subside before he left.        PHYSICAL EXAMINATION: Vital signs: BP 124/80   Pulse 68   Ht 5\' 11"  (1.803 m)   Wt 174 lb (78.9 kg)   BMI 24.27 kg/m   Constitutional: generally well-appearing, no acute distress Psychiatric: alert and oriented x3, cooperative Eyes: extraocular movements  intact, anicteric, conjunctiva pink Mouth: oral pharynx moist, no lesions. Missing teeth Neck: supple no lymphadenopathy Cardiovascular: heart regular rate and rhythm, no murmur Lungs: clear to auscultation bilaterally Abdomen: soft, nontender, nondistended, no obvious ascites, no peritoneal signs, normal bowel sounds, no organomegaly Rectal:omitted Extremities: no clubbing, cyanosis, or lower extremity edema bilaterally Skin: no lesions on visible extremities Neuro: No focal deficits.   ASSESSMENT:  #1. Abnormal virtual colonoscopy concerning for nonobstructing sigmoid colon cancer #2. Problems with rectal bleeding without recurrence #3. Hemoccult-positive stool #4. Advanced age and multiple cardiac issues including no anticoagulation and antiplatelet therapy   PLAN:  #1. Schedule outpatient flexible sigmoidoscopy with biopsies. The patient is high risk. This will need to be performed at the hospital #2. Confer with cardiology if we may hold Coumadin for 3 days. Of course we would continue his Plavix #3. If cancer confirmed he will need surgical evaluation with cardiology input   GI ATTENDING Recent consult note in the office as above. No interval change. Now for flexible sigmoidoscopy. Docia Chuck. Geri Seminole., M.D. [facility] Division of Gastroenterology

## 2017-11-12 NOTE — Telephone Encounter (Signed)
Attempted to return phone call, received message that the mailbox was full and could not take any new messages. Will try again.

## 2017-11-12 NOTE — Telephone Encounter (Signed)
Pt's daughter [name], has some questions regading pt's procedure today. Pls call her.

## 2017-11-12 NOTE — Discharge Instructions (Signed)

## 2017-11-12 NOTE — Interval H&P Note (Signed)
History and Physical Interval Note:  11/12/2017 12:44 PM  [name]  has presented today for surgery, with the diagnosis of Colon mass  The various methods of treatment have been discussed with the patient and family. After consideration of risks, benefits and other options for treatment, the patient has consented to  Procedure(s): FLEXIBLE SIGMOIDOSCOPY (N/A) as a surgical intervention .  The patient's history has been reviewed, patient examined, no change in status, stable for surgery.  I have reviewed the patient's chart and labs.  Questions were answered to the patient's satisfaction.     Scarlette Shorts

## 2017-11-12 NOTE — Op Note (Signed)
[facility] Patient Name: [name] Procedure Date: 11/12/2017 MRN: [id number] Attending MD: Docia Chuck. Henrene Pastor , MD Date of Birth: 22-Feb-1930 CSN: [id number] Age: 84 Admit Type: Outpatient Procedure:                Flexible Sigmoidoscopy, with biopsies, with                            submucosal injection Indications:              Rectal hemorrhage, Abnormal CT of the GI tract Providers:                Docia Chuck. Henrene Pastor, MD, Vista Lawman, RN, Cletis Athens,                            Technician Referring MD:              Medicines:                Fentanyl 25 micrograms IV, Midazolam 2 mg IV, None Complications:            No immediate complications. Estimated Blood Loss:     Estimated blood loss: none. Procedure:                Pre-Anesthesia Assessment:                           - Prior to the procedure, a History and Physical                            was performed, and patient medications and                            allergies were reviewed. The patient's tolerance of                            previous anesthesia was also reviewed. The risks                            and benefits of the procedure and the sedation                            options and risks were discussed with the patient.                            All questions were answered, and informed consent                            was obtained. Prior Anticoagulants: The patient has                            taken Coumadin (warfarin), last dose was 5 days                            prior to procedure. ASA Grade Assessment: III - A  patient with severe systemic disease. After                            reviewing the risks and benefits, the patient was                            deemed in satisfactory condition to undergo the                            procedure.                           After obtaining informed consent, the scope was                            passed under direct  vision. The EC-[id number] ([id number])                            scope was introduced through the anus and advanced                            to the the sigmoid colon. The flexible                            sigmoidoscopy was accomplished without difficulty.                            The patient tolerated the procedure well. The                            quality of the bowel preparation was good. Scope In: Scope Out: Findings:      The perianal and digital rectal examinations were normal.      An ulcerated partially obstructing large mass was found in the distal       sigmoid colon at approximately 20 cm from the anal verge. The mass was       circumferential. The mass measured four cm in length. Mild superficial       Oozing was present. This was biopsied with a cold forceps for histology.       Area was tattooed with an injection of Spot (carbon black) 2 separate       areas 180 from each other approximately 1-2 cm downstream (toward the       rectum) from the mass.See images      Multiple diverticula were found in the sigmoid colon.      The exam was otherwise without abnormality. Impression:               - Malignant partially obstructing tumor in the                            distal sigmoid colon. Biopsied. Tattooed.                           - Diverticulosis in the sigmoid colon.                           -  The examination was otherwise normal. Moderate Sedation:      Moderate (conscious) sedation was administered by the endoscopy nurse       and supervised by the endoscopist. The following parameters were       monitored: oxygen saturation, heart rate, blood pressure, and response       to care. Total physician intraservice time was 15 minutes. Recommendation:           - Patient has a contact number available for                            emergencies. The signs and symptoms of potential                            delayed complications were discussed with the                             patient. Return to normal activities tomorrow.                            Written discharge instructions were provided to the                            patient.                           - Resume previous diet.                           - Await pathology results.                           - Resume Coumadin (warfarin) at prior dose today.                           - General surgical referral "sigmoid colon cancer"                           - Oncology coordinator referral Procedure Code(s):        --- Professional ---                           [phone], Sigmoidoscopy, flexible; with biopsy, single                            or multiple                           [id number], Sigmoidoscopy, flexible; with directed                            submucosal injection(s), any substance                           G0500, Moderate sedation services provided by the  same physician or other qualified health care                            professional performing a gastrointestinal                            endoscopic service that sedation supports,                            requiring the presence of an independent trained                            observer to assist in the monitoring of the                            patient's level of consciousness and physiological                            status; initial 15 minutes of intra-service time;                            patient age 38 years or older (additional time may                            be reported with [phone], as appropriate) Diagnosis Code(s):        --- Professional ---                           C18.7, Malignant neoplasm of sigmoid colon                           K56.690, Other partial intestinal obstruction                           K62.5, Hemorrhage of anus and rectum                           K57.30, Diverticulosis of large intestine without                            perforation or abscess without bleeding                            R93.3, Abnormal findings on diagnostic imaging of                            other parts of digestive tract CPT copyright 2017 American Medical Association. All rights reserved. The codes documented in this report are preliminary and upon coder review may  be revised to meet current compliance requirements. Docia Chuck. Henrene Pastor, MD 11/12/2017 1:39:04 PM This report has been signed electronically. Number of Addenda: 0

## 2017-11-12 NOTE — Telephone Encounter (Signed)
Pt had procedure today at 1:30pm.

## 2017-11-13 ENCOUNTER — Other Ambulatory Visit: Payer: Self-pay

## 2017-11-13 ENCOUNTER — Telehealth: Payer: Self-pay

## 2017-11-13 ENCOUNTER — Telehealth: Payer: Self-pay | Admitting: Internal Medicine

## 2017-11-13 ENCOUNTER — Other Ambulatory Visit: Payer: Self-pay | Admitting: Cardiology

## 2017-11-13 DIAGNOSIS — K6389 Other specified diseases of intestine: Secondary | ICD-10-CM

## 2017-11-13 DIAGNOSIS — C189 Malignant neoplasm of colon, unspecified: Secondary | ICD-10-CM

## 2017-11-13 NOTE — Telephone Encounter (Signed)
-----   Message from Irene Shipper, MD sent at 11/13/2017 12:41 PM EDT ----- Regarding: RE: Appt If that is what the oncology team wants, then proceed ----- Message ----- From: Algernon Huxley, RN Sent: 11/13/2017  12:36 PM To: Irene Shipper, MD Subject: FW: Appt                                       Dr. Henrene Pastor please see below and advise if ok to order CT of CAP.  ----- Message ----- From: Arna Snipe, RN Sent: 11/13/2017  11:57 AM To: Algernon Huxley, RN Subject: RE: Appt                                       Yes.  Dr. Benay Spice needs CT CAP on all of his patient's to rule out metastasis. That is the general work up. The CT's guide the treatment plan.  If the patient does not have mets then surgeon can operate first and then Dr. Benay Spice takes over. There are gray areas related to age or obstruction  but that is usually the way it goes. The surgeon will need the CT's as well.   I just looked in [facility] rec and I see that he has been loaded into their EMR but there is no appointment yet.   At this point we'll wait to see him until after [facility] sees him.  Dawn ----- Message ----- From: Algernon Huxley, RN Sent: 11/13/2017  11:51 AM To: Arna Snipe, RN Subject: RE: Appt                                       Surgical referral has been faxed. Pt had a virtual CT colonoscopy on 4/19. Does he still need CT of CAP? He is a Veterinary surgeon pt.  ----- Message ----- From: Arna Snipe, RN Sent: 11/13/2017  11:38 AM To: Algernon Huxley, RN Subject: RE: Appt                                       Hi again,  Dr. Benay Spice just stepped in to talk about Mr. [place]. He wants patient to see surgeon first unless Dr. Hilarie Fredrickson does not think he is a surgical candidate. He also asked if you are going to get CT CAP? He likes his patients to see surgery first and then he will have his initial consult three weeks after surgery. Has a surgical consult been placed?  Dawn ----- Message ----- From: Algernon Huxley,  RN Sent: 11/13/2017  10:00 AM To: Arna Snipe, RN Subject: Appt                                           Dawn,  I entered the referral for this pt in [place] by mistake. I cancelled that appt and re-entered the referral for [facility]. The pts daughter would like to be called with the appt times.   Thanks, [employer]

## 2017-11-13 NOTE — Telephone Encounter (Signed)
Pt scheduled for CT or CAP at [facility] CT 11/20/17@3 :30pm, pt to arrive there at 3:15pm. Pt to be NPO after 11:30am, drink bottle 1 of contrast at 1:30pm, bottle 2 @ 2:30pm. Pts daughter aware of appt. And knows to pick up contrast and instructions.

## 2017-11-13 NOTE — Telephone Encounter (Signed)
[facility] appt cancelled. New referral placed for  and Veda Canning RN aware. Pts daughter wants to be called with the appt.

## 2017-11-13 NOTE — Progress Notes (Signed)
  Oncology Nurse Navigator Documentation  Navigator Location: [facility] (11/13/17 1244)   )Navigator Encounter Type: Introductory phone call (11/13/17 1244)     Confirmed Diagnosis Date: 11/12/17 (11/13/17 1244)                   Barriers/Navigation Needs: Coordination of Care (11/13/17 1244)    Called patient to introduce myself and to explain the role of GI navigator. I received a message from Dr. Blanch Media nurse about the med/onc referral that was placed on 11/12/17. I spoke with Dr. Benay Spice and he would like patient to be seen by a surgeon at [facility] first. I relayed this information to Dr. Blanch Media office and to patient. I also provided patient with contact information to [facility]. Patient verbalized understanding that he can call me with questions or concerns.            Acuity: Level 2 (11/13/17 1244)         Time Spent with Patient: 30 (11/13/17 1244)

## 2017-11-14 ENCOUNTER — Inpatient Hospital Stay: Payer: Medicare Other | Admitting: Oncology

## 2017-11-15 ENCOUNTER — Encounter (HOSPITAL_COMMUNITY): Payer: Self-pay | Admitting: Internal Medicine

## 2017-11-19 ENCOUNTER — Ambulatory Visit: Payer: Self-pay | Admitting: General Surgery

## 2017-11-19 ENCOUNTER — Ambulatory Visit (INDEPENDENT_AMBULATORY_CARE_PROVIDER_SITE_OTHER): Payer: Medicare Other | Admitting: Pharmacist

## 2017-11-19 DIAGNOSIS — C187 Malignant neoplasm of sigmoid colon: Secondary | ICD-10-CM | POA: Diagnosis not present

## 2017-11-19 DIAGNOSIS — I480 Paroxysmal atrial fibrillation: Secondary | ICD-10-CM

## 2017-11-19 DIAGNOSIS — Z7901 Long term (current) use of anticoagulants: Secondary | ICD-10-CM

## 2017-11-19 LAB — POCT INR: INR: 1.8

## 2017-11-19 MED ORDER — BUPIVACAINE LIPOSOME 1.3 % IJ SUSP: 20.0000 mL | INTRAMUSCULAR | Status: AC

## 2017-11-19 NOTE — H&P (Signed)
History of Present Illness David Ruff MD; 10/26/6220 10:23 AM) The patient is a 84 year old male who presents with colorectal cancer. 84 year old male who presents to the office today with a newly diagnosed sigmoid colon cancer found on colonoscopy by Dr. Henrene Pastor. Previously this year he had developed some hematochezia and was referred for a virtual colonoscopy. This showed a mass in his sigmoid colon. A flexible sigmoidoscopy was performed which did demonstrate the mass. Biopsies were taken and a tattoo was placed distally. Since that time he has had minimal bleeding but does endorse some abdominal pain and increased frequency of bowel habits. He also has noted a decrease in stool caliber.   Problem List/Past Medical Mammie Lorenzo, LPN; 9/79/8921 19:41 PM) MALIGNANT NEOPLASM OF SIGMOID COLON (C18.7)  Past Surgical History Mammie Lorenzo, LPN; 7/40/8144 81:85 PM) Bypass Surgery for Poor Blood Flow to Legs Coronary Artery Bypass Graft Open Inguinal Hernia Surgery Right. Oral Surgery Valve Replacement  Allergies Sabino Gasser; 11/19/2017 9:56 AM) Allergies Reconciled No Known Drug Allergies [11/19/2017]:  Medication History David Ruff, MD; 6/31/4970 11:00 AM) Atorvastatin Calcium (40MG  Tablet, Oral) Active. Clopidogrel Bisulfate (75MG  Tablet, Oral) Active. Hydrocortisone (0.25% Cream, External) Active. Metoprolol Succinate ER (25MG  Tablet ER 24HR, Oral) Active. Warfarin Sodium (5MG  Tablet, Oral) Active. Neomycin Sulfate (500MG  Tablet, 2 (two) Oral SEE NOTE, Taken starting 11/19/2017) Active. (TAKE TWO TABLETS AT 2 PM, 3 PM, AND 10 PM THE DAY PRIOR TO SURGERY) Flagyl (500MG  Tablet, 2 (two) Oral SEE NOTE, Taken starting 11/19/2017) Active. (Take at 2pm, 3pm, and 10pm the day prior to your colon operation)  Social History Mammie Lorenzo, LPN; 2/63/7858 85:02 PM) Alcohol use Moderate alcohol use. Caffeine use Carbonated beverages, Coffee, Tea. Tobacco use  Former smoker.  Family History Mammie Lorenzo, LPN; 7/74/1287 86:76 PM) Alcohol Abuse Daughter, Family Members [place], Son. Bleeding disorder Son. Cancer Mother. Colon Polyps Son. Heart Disease Father. Heart disease in male family member before age 68 Heart disease in male family member before age 57  Other Problems Mammie Lorenzo, LPN; 01/26/9469 96:28 PM) Arthritis Atrial Fibrillation Back Pain Bladder Problems Congestive Heart Failure Enlarged Prostate Gastroesophageal Reflux Disease Melanoma Myocardial infarction     Review of Systems Mammie Lorenzo LPN; 3/66/2947 65:46 PM) General Present- Chills and Fatigue. Not Present- Appetite Loss, Fever, Night Sweats, Weight Gain and Weight Loss. Skin Present- Change in Wart/Mole and Dryness. Not Present- Hives, Jaundice, New Lesions, Non-Healing Wounds, Rash and Ulcer. HEENT Present- Hearing Loss and Visual Disturbances. Not Present- Earache, Hoarseness, Nose Bleed, Oral Ulcers, Ringing in the Ears, Seasonal Allergies, Sinus Pain, Sore Throat, Wears glasses/contact lenses and Yellow Eyes. Respiratory Present- Wheezing. Not Present- Bloody sputum, Chronic Cough, Difficulty Breathing and Snoring. Breast Not Present- Breast Mass, Breast Pain, Nipple Discharge and Skin Changes. Cardiovascular Present- Leg Cramps, Rapid Heart Rate and Swelling of Extremities. Not Present- Chest Pain, Difficulty Breathing Lying Down, Palpitations and Shortness of Breath. Gastrointestinal Present- Abdominal Pain, Bloody Stool, Change in Bowel Habits, Constipation, Excessive gas, Gets full quickly at meals and Indigestion. Not Present- Bloating, Chronic diarrhea, Difficulty Swallowing, Hemorrhoids, Nausea, Rectal Pain and Vomiting. Male Genitourinary Present- Change in Urinary Stream, Frequency, Nocturia, Urgency and Urine Leakage. Not Present- Blood in Urine, Impotence and Painful Urination. Musculoskeletal Present- Back Pain, Joint Pain,  Joint Stiffness and Swelling of Extremities. Not Present- Muscle Pain and Muscle Weakness. Neurological Present- Decreased Memory, Tremor, Trouble walking and Weakness. Not Present- Fainting, Headaches, Numbness, Seizures and Tingling. Endocrine Present- Cold Intolerance. Not Present- Excessive Hunger, Hair Changes,  Heat Intolerance, Hot flashes and New Diabetes. Hematology Present- Blood Thinners and Easy Bruising. Not Present- Excessive bleeding, Gland problems, HIV and Persistent Infections.  Vitals Sabino Gasser; 11/19/2017 9:57 AM) 11/19/2017 9:56 AM Weight: 176 lb Height: 71in Body Surface Area: 2 m Body Mass Index: 24.55 kg/m  Temp.: 97.68F(Oral)  Pulse: 55 (Regular)  BP: 134/72 (Sitting, Left Arm, Standard)      Physical Exam David Ruff MD; 0/94/0768 10:24 AM)  General Mental Status-Alert. General Appearance-Not in acute distress. Build & Nutrition-Well nourished. Posture-Normal posture. Gait-Normal.  Head and Neck Head-normocephalic, atraumatic with no lesions or palpable masses. Trachea-midline.  Chest and Lung Exam Chest and lung exam reveals -on auscultation, normal breath sounds, no adventitious sounds and normal vocal resonance.  Cardiovascular Cardiovascular examination reveals -normal heart sounds, regular rate and rhythm with no murmurs and no digital clubbing, cyanosis, edema, increased warmth or tenderness.  Abdomen Inspection Inspection of the abdomen reveals - No Hernias. Palpation/Percussion Palpation and Percussion of the abdomen reveal - Soft, Non Tender, No Rigidity (guarding), No hepatosplenomegaly and No Palpable abdominal masses.  Neurologic Neurologic evaluation reveals -alert and oriented x 3 with no impairment of recent or remote memory, normal attention span and ability to concentrate, normal sensation and normal coordination.  Musculoskeletal Normal Exam - Bilateral-Upper Extremity Strength Normal  and Lower Extremity Strength Normal.    Assessment & Plan David Ruff MD; 0/88/1103 10:24 AM)  MALIGNANT NEOPLASM OF SIGMOID COLON (C18.7) Impression: 84 year old male with a newly diagnosed sigmoid colon cancer found after a bout of hematochezia. Flexible sigmoidoscopy was performed and showed adenocarcinoma approximately 20 cm from anal verge. This was tattooed. The patient has significant cardiac disease, atrial fibrillation and is on chronic anticoagulation with Plavix and Coumadin. I think he would be a reasonable candidate for sigmoidectomy. This can be performed a minimally invasive fashion. We will obtain cardiac clearance prior to surgery as well as directions on managing his anticoagulation. He will undergo CT scans of chest abdomen and pelvis tomorrow and we will review those. There is no sign of metastatic disease, we will plan to proceed with surgery. We will get a CEA level on his preop laboratory work. The surgery and anatomy were described to the patient as well as the risks of surgery and the possible complications. These include: Bleeding, deep abdominal infections and possible wound complications such as hernia and infection, damage to adjacent structures, leak of surgical connections, which can lead to other surgeries and possibly an ostomy, possible need for other procedures, such as abscess drains in radiology, possible prolonged hospital stay, possible diarrhea from removal of part of the colon, possible constipation from narcotics, possible bowel, bladder or sexual dysfunction if having rectal surgery, prolonged fatigue/weakness or appetite loss, possible early recurrence of of disease, possible complications of their medical problems such as heart disease or arrhythmias or lung problems, death (less than 1%). I believe the patient understands and wishes to proceed with the surgery.

## 2017-11-19 NOTE — Patient Instructions (Signed)
Description   Take 1.5 tablets today, then continue taking normal dose coumadin 5mg  (1 tablet) daily except 2.5mg  (1/2 tablet) on Sundays . Recheck INR in 2 weeks. Call with any questions new medications or if scheduled for any procedures [phone]

## 2017-11-20 ENCOUNTER — Ambulatory Visit (INDEPENDENT_AMBULATORY_CARE_PROVIDER_SITE_OTHER)
Admission: RE | Admit: 2017-11-20 | Discharge: 2017-11-20 | Disposition: A | Discharge status: Home / Self Care | Payer: Medicare Other | Source: Ambulatory Visit | Attending: Internal Medicine | Admitting: Internal Medicine

## 2017-11-20 ENCOUNTER — Telehealth: Payer: Self-pay

## 2017-11-20 DIAGNOSIS — C189 Malignant neoplasm of colon, unspecified: Secondary | ICD-10-CM | POA: Diagnosis not present

## 2017-11-20 MED ORDER — IOPAMIDOL (ISOVUE-300) INJECTION 61%
100.0000 mL | Freq: Once | INTRAVENOUS | Status: AC | PRN
  Administered 2017-11-20: 100 mL via INTRAVENOUS

## 2017-11-20 NOTE — Telephone Encounter (Signed)
   [facility] Pre-operative Risk Assessment    Request for surgical clearance:  1. What type of surgery is being performed? Sigmoidectomy    2. When is this surgery scheduled? TBD   3. What type of clearance is required (medical clearance vs. Pharmacy clearance to hold med vs. Both)? Both  4. Are there any medications that need to be held prior to surgery and how long? Needs to know how long pt should hold Plavix and Warfarin preoperatively    5. Practice name and name of physician performing surgery? [facility], Dr.Alicia Marcello Moores  6. What is your office phone number [phone]   7.   What is your office fax number [phone] Attn: Mammie Lorenzo   8.   Anesthesia type (None, local, MAC, general) ? General    [name] 11/20/2017, 11:13 AM  _________________________________________________________________   (provider comments below)

## 2017-11-22 NOTE — Telephone Encounter (Signed)
Pharmacy has given their recommendations on holding coumadin in a previous note not linked with this encounter. I have sent a message to Dr. Angelena Form for recommendations to hold plavix for procedure. Once this is received, will need medical clearance.

## 2017-11-26 ENCOUNTER — Ambulatory Visit (INDEPENDENT_AMBULATORY_CARE_PROVIDER_SITE_OTHER): Payer: Medicare Other | Admitting: Cardiovascular Disease

## 2017-11-26 ENCOUNTER — Encounter: Payer: Self-pay | Admitting: Cardiovascular Disease

## 2017-11-26 VITALS — BP 136/60 | HR 77 | Ht 71.0 in | Wt 173.0 lb

## 2017-11-26 DIAGNOSIS — I10 Essential (primary) hypertension: Secondary | ICD-10-CM

## 2017-11-26 DIAGNOSIS — Z01810 Encounter for preprocedural cardiovascular examination: Secondary | ICD-10-CM | POA: Diagnosis not present

## 2017-11-26 DIAGNOSIS — I480 Paroxysmal atrial fibrillation: Secondary | ICD-10-CM | POA: Diagnosis not present

## 2017-11-26 DIAGNOSIS — I2510 Atherosclerotic heart disease of native coronary artery without angina pectoris: Secondary | ICD-10-CM

## 2017-11-26 DIAGNOSIS — I255 Ischemic cardiomyopathy: Secondary | ICD-10-CM

## 2017-11-26 NOTE — Patient Instructions (Addendum)
Medication Instructions:  Your physician recommends that you continue on your current medications as directed. Please refer to the Current Medication list given to you today.   Labwork: none  Testing/Procedures: none  Follow-Up: Your physician recommends that you schedule a follow-up appointment in: 3-4 months. --Scheduled for August 26,2019 at 9:40    Any Other Special Instructions Will Be Listed Below (If Applicable).     If you need a refill on your cardiac medications before your next appointment, please call your pharmacy.

## 2017-11-26 NOTE — Progress Notes (Signed)
Chief Complaint  Patient presents with  . Follow-up    CAD     History of Present Illness: 84 yo male with history of CAD, HTN who is here today for follow up. First cardiac stents placed in the Circumflex and LAD in 2007. The stents were Taxus drug eluting stents. LV function was normal at that time. I saw him 01/15/17 and he had c/o leg weakness, dizziness. He had one fall due to sudden onset of weakness in his left leg. 48 hour cardiac monitor showed sinus bradycardia with PVCs, PACs and a short run of a wide complex tachycardia. Echo 01/25/17 with normal LV systolic function, no significant valve disease. He was admitted to [facility] 08/08/17 with a NSTEMI and was found to have severe disease in the LAD and Circumflex, both treated with drug eluting stents. (2 drug eluting stents placed in the Circumflex and one drug eluting stent placed in the LAD). He was also found to have atrial fibrillation and was started on coumadin. He did not wish to start Eliquis due to lack of medication coverage with his insurance. Echo with LVEF=35-40%.  He has been diagnosed with colon cancer. He is planning a colectomy with Dr. Marcello Sandoval.   He is here today for follow up. The patient denies any chest pain, dyspnea, palpitations, orthopnea, PND, dizziness, near syncope or syncope. He has mild LE edema.    Primary Care Physician: David Redwood, MD  Past Medical History:  Diagnosis Date  . A-fib ([place])   . Arthritis   . BPH (benign prostatic hyperplasia)   . Chest pain, unspecified   . Chronic kidney disease   . Coronary atherosclerosis of unspecified type of vessel, native or graft   . Dizziness and giddiness   . Hyperlipidemia   . Hypertension   . Myocardial infarction ([place])   . Peripheral neuropathy   . PVD (peripheral vascular disease) ([place])   . Shortness of breath    exertion  . Vitamin B12 deficiency     Past Surgical History:  Procedure Laterality Date  . CORONARY ANGIOPLASTY     4 stents  .  CORONARY STENT INTERVENTION N/A 08/09/2017   Procedure: CORONARY STENT INTERVENTION;  Surgeon: David Bush, MD;  Location: [place] [facility];  Service: Cardiovascular;  Laterality: N/A;  . FLEXIBLE SIGMOIDOSCOPY N/A 11/12/2017   Procedure: FLEXIBLE SIGMOIDOSCOPY;  Surgeon: David Shipper, MD;  Location: [facility];  Service: Endoscopy;  Laterality: N/A;  . INGUINAL HERNIA REPAIR  06/14/2012   Procedure: HERNIA REPAIR INGUINAL ADULT;  Surgeon: David Hollingshead, MD;  Location: [place];  Service: General;  Laterality: Right;  . INSERTION OF MESH  06/14/2012   Procedure: INSERTION OF MESH;  Surgeon: David Hollingshead, MD;  Location: [place];  Service: General;  Laterality: Right;  . LEFT HEART CATH AND CORONARY ANGIOGRAPHY N/A 08/09/2017   Procedure: LEFT HEART CATH AND CORONARY ANGIOGRAPHY;  Surgeon: David Bush, MD;  Location: [place] [facility];  Service: Cardiovascular;  Laterality: N/A;  . SINUS SURGERY WITH INSTATRAK  1985    Current Outpatient Medications  Medication Sig Dispense Refill  . atorvastatin (LIPITOR) 40 MG tablet TAKE ONE TABLET DAILY AT 6PM 90 tablet 2  . clopidogrel (PLAVIX) 75 MG tablet TAKE ONE TABLET EACH DAY (Patient taking differently: Take 75 mg by mouth daily. ) 90 tablet 3  . hydrocortisone 2.5 % cream Apply 1 application topically every other day.    . metoprolol succinate (TOPROL-XL) 25 MG 24  hr tablet TAKE 3 TABLETS DAILY (Patient taking differently: Take 75 mg by mouth in the morning) 90 tablet 3  . warfarin (COUMADIN) 5 MG tablet Take as directed by coumadin clinic (Patient taking differently: Take 2.5-5 mg by mouth See admin instructions. Take 2.5 mg by mouth daily in the evening at 1800 on Sunday. Take 5 mg by mouth daily in the evening at 1800 on all other days) 30 tablet 1   No current facility-administered medications for this visit.     Allergies  Allergen Reactions  . Gadolinium Derivatives Hives, Itching and Other (See Comments)    Dr. Jeralyn Sandoval s/w Mr.  Sandoval. We observed him for 15 minutes. He did not need to take Benadryl. The symptoms began to subside before he left.     Social History   Socioeconomic History  . Marital status: Divorced    Spouse name: Not on file  . Number of children: 3  . Years of education: Not on file  . Highest education level: Not on file  Occupational History  . Occupation: Magazine features editor    Comment: [employer]  Social Needs  . Financial resource strain: Not on file  . Food insecurity:    Worry: Not on file    Inability: Not on file  . Transportation needs:    Medical: Not on file    Non-medical: Not on file  Tobacco Use  . Smoking status: Former Smoker    Last attempt to quit: 07/10/1981    Years since quitting: 36.4  . Smokeless tobacco: Never Used  Substance and Sexual Activity  . Alcohol use: No  . Drug use: No  . Sexual activity: Not on file  Lifestyle  . Physical activity:    Days per week: Not on file    Minutes per session: Not on file  . Stress: Not on file  Relationships  . Social connections:    Talks on phone: Not on file    Gets together: Not on file    Attends religious service: Not on file    Active member of club or organization: Not on file    Attends meetings of clubs or organizations: Not on file    Relationship status: Not on file  . Intimate partner violence:    Fear of current or ex partner: Not on file    Emotionally abused: Not on file    Physically abused: Not on file    Forced sexual activity: Not on file  Other Topics Concern  . Not on file  Social History Narrative   Regular exercise          Family History  Problem Relation Age of Onset  . Heart attack Father 14  . Arthritis Sister   . Hypothyroidism Daughter     Review of Systems:  As stated in the HPI and otherwise negative.   BP 136/60   Pulse 77   Ht 5\' 11"  (1.803 m)   Wt 173 lb (78.5 kg)   SpO2 99%   BMI 24.13 kg/m   Physical Examination:   General: Well  developed, well nourished, NAD  HEENT: OP clear, mucus membranes moist  SKIN: warm, dry. No rashes. Neuro: No focal deficits  Musculoskeletal: Muscle strength 5/5 all ext  Psychiatric: Mood and affect normal  Neck: No JVD, no carotid bruits, no thyromegaly, no lymphadenopathy.  Lungs:Clear bilaterally, no wheezes, rhonci, crackles Cardiovascular: Regular rate and rhythm. No murmurs, gallops or rubs. Abdomen:Soft. Bowel sounds present. Non-tender.  Extremities: Trace to 1+ bilateral lower extremity edema. Pulses are 2 + in the bilateral DP/PT.  Echo 08/10/17: - Left ventricle: The cavity size was normal. Wall thickness was   increased in a pattern of mild LVH. Systolic function was   moderately reduced. The estimated ejection fraction was in the   range of 35% to 40%. Global hypokinesis with regional variation.   The study is not technically sufficient to allow evaluation of LV   diastolic function. - Mitral valve: Mildly thickened leaflets . There was trivial   regurgitation. - Left atrium: The atrium was normal in size. - Inferior vena cava: The vessel was dilated. The respirophasic   diameter changes were blunted (< 50%), consistent with elevated   central venous pressure.  Impressions:  - LVEF 35-40%, mild LVH, global hypokinesis with regional   variation, trivial MR, normal LA size, dilated IVC.  Cardiac cath 08/09/17:  1. Severe 2-vessel coronary artery disease, including 80% in-stent restenosis of the mid LAD with disease extending beyond the distal stent edge, as well as 70% ostial/proximal LCx disease followed by multifocal mid and distal LCx in-stent restenosis of up to 99%. 2. Moderate, non-obstructive RCA disease. 3. Normal left ventricular filling pressure. 4. Successful PCI to mid LAD with placement of Resolute Onyx 2.75 x 34 mm drug eluting stent (post-dilated to 3.1 mm) with 0% residual stenosis and TIMI-3 flow. 5. Successful PCI to LCx with placement of Resolute  Onyx drug-eluting stents extending from the ostium to the proximal segment of old stent (2.5 x 18 mm) and covering the distal 1/3 of old stent extending into the distal LCx (2.0 x 30 mm) with 0% residual stenosis and TIMI-3 flow.  EKG:  EKG is  ordered today. The ekg ordered today demonstrates Atrial fib, rate 77 bpm. LBBB intermittent  Recent Labs: 08/08/2017: TSH 0.926 08/10/2017: Hemoglobin 13.4; Platelets 180 08/12/2017: Magnesium 2.2 10/17/2017: BUN 26; Creatinine, Ser 1.35; Potassium 4.9; Sodium 140   Lipid Panel    Component Value Date/Time   CHOL 160 08/09/2017 0359   TRIG 74 08/09/2017 0359   HDL 41 08/09/2017 0359   CHOLHDL 3.9 08/09/2017 0359   VLDL 15 08/09/2017 0359   LDLCALC 104 (H) 08/09/2017 0359     Wt Readings from Last 3 Encounters:  11/26/17 173 lb (78.5 kg)  10/31/17 174 lb (78.9 kg)  10/10/17 177 lb (80.3 kg)     Other studies Reviewed: Additional studies/ records that were reviewed today include: . Review of the above records demonstrates:    Assessment and Plan:   1. CAD without angina: No chest pain. Will continue Plavix and beta blocker. Given his pending surgery, we will hold Plavix for 5 days prior to his surgery and then resume when safe from a surgical standpoint following his surgery. He is not on an ASA since he is on coumadin as well.    2. Atrial fibrillation, paroxysmal: He is in atrial fib today. Rate is controlled. Continue beta blocker and coumadin.    3. HTN: BP is well controlled. No changes.   4. Ischemic cardiomyopathy: LVEF around 40% by echo January 2019. This may be an ischemic cardiomyopathy and hopefully LV function will normalize following his PCI. Will repeat echo in 3 months. Continue beta blocker. Ace inh held due to renal insufficiency.    5. Colon cancer: He has a pending colectomy.   6. Pre-operative cardiovascular risk assessment: He is stable from a cardiac standpoint. He does not require stress testing prior  to his  surgery given recent cardiac cath in January 2019. OK to hold Plavix and Coumadin 5 days before his planned surgical procedure (He is now 3 months post drug eluting coronary stent placement which should have given him adequate coverage).    Current medicines are reviewed at length with the patient today.  The patient does not have concerns regarding medicines.  The following changes have been made:  no change  Labs/ tests ordered today include:   Orders Placed This Encounter  Procedures  . EKG 12-Lead    Disposition:   FU with me in 3-4  months  Signed, Lauree Chandler, MD 11/26/2017 11:07 AM    [facility] [address], [place], [place]  [address] Phone: [phone]; Fax: [phone]

## 2017-11-26 NOTE — Telephone Encounter (Signed)
I am seeing him today. [name]

## 2017-11-27 NOTE — Telephone Encounter (Signed)
   Primary Cardiologist: Lauree Chandler, MD  Chart reviewed as part of pre-operative protocol coverage. Given past medical history and time since last visit, based on ACC/AHA guidelines, UGO THOMA would be at acceptable risk for the planned procedure without further cardiovascular testing.   Mr. Nevel was seen on 11/26/17 by Dr. Angelena Form: "He is stable from a cardiac standpoint. He does not require stress testing prior to his surgery given recent cardiac cath in January 2019. OK to hold Plavix and Coumadin 5 days before his planned surgical procedure (He is now 3 months post drug eluting coronary stent placement which should have given him adequate coverage)."   I will route this recommendation to the requesting party via Epic fax function and remove from pre-op pool.  Please call with questions.  Daune Perch, NP 11/27/2017, 4:45 PM

## 2017-11-29 ENCOUNTER — Ambulatory Visit: Payer: Self-pay | Admitting: General Surgery

## 2017-11-30 ENCOUNTER — Telehealth: Payer: Self-pay | Admitting: Internal Medicine

## 2017-11-30 NOTE — Telephone Encounter (Signed)
I spoke with the daughter and she is concerned because she has not heard from [place] concerning surgery.  I called [facility] and they report that the patient was seen 11/19/17 and they are now waiting for Cardiac clearance.  I relayed this to the daughter and she was going to reach out to Cardiology.

## 2017-12-03 ENCOUNTER — Other Ambulatory Visit: Payer: Self-pay | Admitting: Cardiovascular Disease

## 2017-12-06 ENCOUNTER — Ambulatory Visit (INDEPENDENT_AMBULATORY_CARE_PROVIDER_SITE_OTHER): Payer: Medicare Other | Admitting: *Deleted

## 2017-12-06 DIAGNOSIS — I480 Paroxysmal atrial fibrillation: Secondary | ICD-10-CM | POA: Diagnosis not present

## 2017-12-06 DIAGNOSIS — Z7901 Long term (current) use of anticoagulants: Secondary | ICD-10-CM

## 2017-12-06 DIAGNOSIS — Z5181 Encounter for therapeutic drug level monitoring: Secondary | ICD-10-CM | POA: Diagnosis not present

## 2017-12-06 LAB — POCT INR: INR: 2.3 (ref 2.0–3.0)

## 2017-12-06 NOTE — Patient Instructions (Signed)
Description   Continue same dose of coumadin  5mg  (1 tablet) daily except 2.5mg  (1/2 tablet) on Sundays . Recheck INR in 1 week after surgery. Call with any questions new medications or if scheduled for any other  procedures [phone] Last Day to take coumadin is June 1st then no coumadin until after surgery on June7th then coumadin will be restarted as instructed by Doctor in the hospital and follow his directions regarding dose of coumadin

## 2017-12-07 NOTE — Patient Instructions (Addendum)
[name]  12/07/2017   Your procedure is scheduled on: 12-14-17   Report to [facility] Main  Entrance              Report to admitting at    0730 AM     Call this number if you have problems the morning of surgery [phone]               FOLLOW A CLEAR LIQUID DIET THE DAY OF [place]. FOLLOW  BOWEL  PREP PER MD INSTRUCTIONS   Remember: Do not eat food:After Midnight.   DRINK 2 PRESURGERY ENSURE DRINKS THE NIGHT BEFORE SURGERY AT  1000 PM AND 1 PRESURGERY DRINK THE DAY OF THE PROCEDURE 3 HOURS PRIOR TO SCHEDULED SURGERY. NO SOLIDS AFTER MIDNIGHT THE DAY PRIOR TO THE SURGERY. NOTHING BY MOUTH EXCEPT CLEAR LIQUIDS UNTIL THREE HOURS PRIOR TO SCHEDULED SURGERY. PLEASE FINISH PRESURGERY ENSURE DRINK PER SURGEON ORDER 3 HOURS PRIOR TO SCHEDULED SURGERY TIME WHICH NEEDS TO BE COMPLETED AT  __0630 am_______.     CLEAR LIQUID DIET   Foods Allowed                                                                     Foods Excluded  Coffee and tea, regular and decaf                             liquids that you cannot  Plain Jell-O in any flavor                                             see through such as: Fruit ices (not with fruit pulp)                                     milk, soups, orange juice  Iced Popsicles                                    All solid food Carbonated beverages, regular and diet                                    Cranberry, grape and apple juices Sports drinks like Gatorade Lightly seasoned clear broth or consume(fat free) Sugar, honey syrup  Sample Menu Breakfast                                Lunch                                     Supper Cranberry juice  Beef broth                            Chicken broth Jell-O                                     Grape juice                           Apple juice Coffee or tea                        Jell-O                                      Popsicle                                 Coffee or tea                        Coffee or tea  _____________________________________________________________________    Take these medicines the morning of surgery with A SIP OF WATER: metoprolol                                You may not have any metal on your body including hair pins and              piercings  Do not wear jewelry,  lotions, powders or perfumes, deodorant    .              Men may shave face and neck.   Do not bring valuables to the hospital. [place].  Contacts, dentures or bridgework may not be worn into surgery.  Leave suitcase in the car. After surgery it may be brought to your room.               Please read over the following fact sheets you were given: _____________________________________________________________________             [facility] - Preparing for Surgery Before surgery, you can play an important role.  Because skin is not sterile, your skin needs to be as free of germs as possible.  You can reduce the number of germs on your skin by washing with CHG (chlorahexidine gluconate) soap before surgery.  CHG is an antiseptic cleaner which kills germs and bonds with the skin to continue killing germs even after washing. Please DO NOT use if you have an allergy to CHG or antibacterial soaps.  If your skin becomes reddened/irritated stop using the CHG and inform your nurse when you arrive at Short Stay. Do not shave (including legs and underarms) for at least 48 hours prior to the first CHG shower.  You may shave your face/neck. Please follow these instructions carefully:  1.  Shower with CHG Soap the night before surgery and the  morning of Surgery.  2.  If you choose to wash your hair, wash your hair first as usual with your  normal  shampoo.  3.  After you shampoo, rinse your hair and body thoroughly to remove the  shampoo.                           4.  Use CHG as you  would any other liquid soap.  You can apply chg directly  to the skin and wash                       Gently with a scrungie or clean washcloth.  5.  Apply the CHG Soap to your body ONLY FROM THE NECK DOWN.   Do not use on face/ open                           Wound or open sores. Avoid contact with eyes, ears mouth and genitals (private parts).                       Wash face,  Genitals (private parts) with your normal soap.             6.  Wash thoroughly, paying special attention to the area where your surgery  will be performed.  7.  Thoroughly rinse your body with warm water from the neck down.  8.  DO NOT shower/wash with your normal soap after using and rinsing off  the CHG Soap.                9.  Pat yourself dry with a clean towel.            10.  Wear clean pajamas.            11.  Place clean sheets on your bed the night of your first shower and do not  sleep with pets. Day of Surgery : Do not apply any lotions/deodorants the morning of surgery.  Please wear clean clothes to the hospital/surgery center.  FAILURE TO FOLLOW THESE INSTRUCTIONS MAY RESULT IN THE CANCELLATION OF YOUR SURGERY PATIENT SIGNATURE_________________________________  NURSE SIGNATURE__________________________________  ________________________________________________________________________  WHAT IS A BLOOD TRANSFUSION? Blood Transfusion Information  A transfusion is the replacement of blood or some of its parts. Blood is made up of multiple cells which provide different functions.  Red blood cells carry oxygen and are used for blood loss replacement.  White blood cells fight against infection.  Platelets control bleeding.  Plasma helps clot blood.  Other blood products are available for specialized needs, such as hemophilia or other clotting disorders. BEFORE THE TRANSFUSION  Who gives blood for transfusions?   Healthy volunteers who are fully evaluated to make sure their blood is safe. This is blood  bank blood. Transfusion therapy is the safest it has ever been in the practice of medicine. Before blood is taken from a donor, a complete history is taken to make sure that person has no history of diseases nor engages in risky social behavior (examples are intravenous drug use or sexual activity with multiple partners). The donor's travel history is screened to minimize risk of transmitting infections, such as malaria. The donated blood is tested for signs of infectious diseases, such as HIV and hepatitis. The blood is then tested to be sure it is compatible with you in order to minimize the chance of a transfusion reaction. If you or a relative donates blood, this is often done in anticipation of surgery and is  not appropriate for emergency situations. It takes many days to process the donated blood. RISKS AND COMPLICATIONS Although transfusion therapy is very safe and saves many lives, the main dangers of transfusion include:   Getting an infectious disease.  Developing a transfusion reaction. This is an allergic reaction to something in the blood you were given. Every precaution is taken to prevent this. The decision to have a blood transfusion has been considered carefully by your caregiver before blood is given. Blood is not given unless the benefits outweigh the risks. AFTER THE TRANSFUSION  Right after receiving a blood transfusion, you will usually feel much better and more energetic. This is especially true if your red blood cells have gotten low (anemic). The transfusion raises the level of the red blood cells which carry oxygen, and this usually causes an energy increase.  The nurse administering the transfusion will monitor you carefully for complications. HOME CARE INSTRUCTIONS  No special instructions are needed after a transfusion. You may find your energy is better. Speak with your caregiver about any limitations on activity for underlying diseases you may have. SEEK MEDICAL CARE  IF:   Your condition is not improving after your transfusion.  You develop redness or irritation at the intravenous (IV) site. SEEK IMMEDIATE MEDICAL CARE IF:  Any of the following symptoms occur over the next 12 hours:  Shaking chills.  You have a temperature by mouth above 102 F (38.9 C), not controlled by medicine.  Chest, back, or muscle pain.  People around you feel you are not acting correctly or are confused.  Shortness of breath or difficulty breathing.  Dizziness and fainting.  You get a rash or develop hives.  You have a decrease in urine output.  Your urine turns a dark color or changes to pink, red, or brown. Any of the following symptoms occur over the next 10 days:  You have a temperature by mouth above 102 F (38.9 C), not controlled by medicine.  Shortness of breath.  Weakness after normal activity.  The white part of the eye turns yellow (jaundice).  You have a decrease in the amount of urine or are urinating less often.  Your urine turns a dark color or changes to pink, red, or brown. Document Released: 06/23/2000 Document Revised: 09/18/2011 Document Reviewed: 02/10/2008 ExitCare Patient Information 2014 [place].  _______________________________________________________________________  Incentive Spirometer  An incentive spirometer is a tool that can help keep your lungs clear and active. This tool measures how well you are filling your lungs with each breath. Taking long deep breaths may help reverse or decrease the chance of developing breathing (pulmonary) problems (especially infection) following:  A long period of time when you are unable to move or be active. BEFORE THE PROCEDURE   If the spirometer includes an indicator to show your best effort, your nurse or respiratory therapist will set it to a desired goal.  If possible, sit up straight or lean slightly forward. Try not to slouch.  Hold the incentive spirometer in an  upright position. INSTRUCTIONS FOR USE  1. Sit on the edge of your bed if possible, or sit up as far as you can in bed or on a chair. 2. Hold the incentive spirometer in an upright position. 3. Breathe out normally. 4. Place the mouthpiece in your mouth and seal your lips tightly around it. 5. Breathe in slowly and as deeply as possible, raising the piston or the ball toward the top of the column. 6. Hold  your breath for 3-5 seconds or for as long as possible. Allow the piston or ball to fall to the bottom of the column. 7. Remove the mouthpiece from your mouth and breathe out normally. 8. Rest for a few seconds and repeat Steps 1 through 7 at least 10 times every 1-2 hours when you are awake. Take your time and take a few normal breaths between deep breaths. 9. The spirometer may include an indicator to show your best effort. Use the indicator as a goal to work toward during each repetition. 10. After each set of 10 deep breaths, practice coughing to be sure your lungs are clear. If you have an incision (the cut made at the time of surgery), support your incision when coughing by placing a pillow or rolled up towels firmly against it. Once you are able to get out of bed, walk around indoors and cough well. You may stop using the incentive spirometer when instructed by your caregiver.  RISKS AND COMPLICATIONS  Take your time so you do not get dizzy or light-headed.  If you are in pain, you may need to take or ask for pain medication before doing incentive spirometry. It is harder to take a deep breath if you are having pain. AFTER USE  Rest and breathe slowly and easily.  It can be helpful to keep track of a log of your progress. Your caregiver can provide you with a simple table to help with this. If you are using the spirometer at home, follow these instructions: [place] IF:   You are having difficultly using the spirometer.  You have trouble using the spirometer as often as  instructed.  Your pain medication is not giving enough relief while using the spirometer.  You develop fever of 100.5 F (38.1 C) or higher. SEEK IMMEDIATE MEDICAL CARE IF:   You cough up bloody sputum that had not been present before.  You develop fever of 102 F (38.9 C) or greater.  You develop worsening pain at or near the incision site. MAKE SURE YOU:   Understand these instructions.  Will watch your condition.  Will get help right away if you are not doing well or get worse. Document Released: 11/06/2006 Document Revised: 09/18/2011 Document Reviewed: 01/07/2007 [facility] Patient Information 2014 [place], [place].   ________________________________________________________________________

## 2017-12-10 ENCOUNTER — Encounter (HOSPITAL_COMMUNITY): Payer: Self-pay

## 2017-12-10 ENCOUNTER — Other Ambulatory Visit: Payer: Self-pay

## 2017-12-10 ENCOUNTER — Encounter (HOSPITAL_COMMUNITY)
Admission: RE | Admit: 2017-12-10 | Discharge: 2017-12-10 | Disposition: A | Discharge status: Home / Self Care | Payer: Medicare Other | Source: Ambulatory Visit | Attending: General Surgery | Admitting: General Surgery

## 2017-12-10 DIAGNOSIS — Z9889 Other specified postprocedural states: Secondary | ICD-10-CM | POA: Insufficient documentation

## 2017-12-10 DIAGNOSIS — N189 Chronic kidney disease, unspecified: Secondary | ICD-10-CM | POA: Insufficient documentation

## 2017-12-10 DIAGNOSIS — Z79899 Other long term (current) drug therapy: Secondary | ICD-10-CM | POA: Diagnosis not present

## 2017-12-10 DIAGNOSIS — E785 Hyperlipidemia, unspecified: Secondary | ICD-10-CM | POA: Diagnosis not present

## 2017-12-10 DIAGNOSIS — Z7901 Long term (current) use of anticoagulants: Secondary | ICD-10-CM | POA: Diagnosis not present

## 2017-12-10 DIAGNOSIS — Z87891 Personal history of nicotine dependence: Secondary | ICD-10-CM | POA: Insufficient documentation

## 2017-12-10 DIAGNOSIS — I255 Ischemic cardiomyopathy: Secondary | ICD-10-CM | POA: Insufficient documentation

## 2017-12-10 DIAGNOSIS — I480 Paroxysmal atrial fibrillation: Secondary | ICD-10-CM | POA: Diagnosis not present

## 2017-12-10 DIAGNOSIS — I739 Peripheral vascular disease, unspecified: Secondary | ICD-10-CM | POA: Insufficient documentation

## 2017-12-10 DIAGNOSIS — Z01812 Encounter for preprocedural laboratory examination: Secondary | ICD-10-CM | POA: Diagnosis not present

## 2017-12-10 DIAGNOSIS — I2510 Atherosclerotic heart disease of native coronary artery without angina pectoris: Secondary | ICD-10-CM | POA: Diagnosis not present

## 2017-12-10 DIAGNOSIS — C189 Malignant neoplasm of colon, unspecified: Secondary | ICD-10-CM | POA: Diagnosis not present

## 2017-12-10 DIAGNOSIS — I129 Hypertensive chronic kidney disease with stage 1 through stage 4 chronic kidney disease, or unspecified chronic kidney disease: Secondary | ICD-10-CM | POA: Insufficient documentation

## 2017-12-10 LAB — BASIC METABOLIC PANEL
ANION GAP: 11 (ref 5–15)
BUN: 28 mg/dL — ABNORMAL HIGH (ref 6–20)
CHLORIDE: 105 mmol/L (ref 101–111)
CO2: 25 mmol/L (ref 22–32)
Calcium: 9.3 mg/dL (ref 8.9–10.3)
Creatinine, Ser: 1.27 mg/dL — ABNORMAL HIGH (ref 0.61–1.24)
GFR calc Af Amer: 58 mL/min — ABNORMAL LOW (ref >60)
GFR, EST NON AFRICAN AMERICAN: 50 mL/min — AB (ref >60)
Glucose, Bld: 111 mg/dL — ABNORMAL HIGH (ref 65–99)
Potassium: 4 mmol/L (ref 3.5–5.1)
SODIUM: 141 mmol/L (ref 135–145)

## 2017-12-10 LAB — CBC
HEMATOCRIT: 43.8 % (ref 39.0–52.0)
HEMOGLOBIN: 14.1 g/dL (ref 13.0–17.0)
MCH: 27.5 pg (ref 26.0–34.0)
MCHC: 32.2 g/dL (ref 30.0–36.0)
MCV: 85.5 fL (ref 78.0–100.0)
Platelets: 221 10*3/uL (ref 150–400)
RBC: 5.12 MIL/uL (ref 4.22–5.81)
RDW: 14.5 % (ref 11.5–15.5)
WBC: 7.4 10*3/uL (ref 4.0–10.5)

## 2017-12-10 LAB — PROTIME-INR
INR: 2.04
PROTHROMBIN TIME: 22.8 s — AB (ref 11.4–15.2)

## 2017-12-10 LAB — ABO/RH: ABO/RH(D): A POS

## 2017-12-10 NOTE — Progress Notes (Signed)
Bmp done 12-10-17 routed to Dr. Marcello Moores via epic

## 2017-12-10 NOTE — Progress Notes (Addendum)
Cardiac clearance 11-20-17 epic ekg 11-26-17 epic

## 2017-12-11 ENCOUNTER — Telehealth: Payer: Self-pay | Admitting: Internal Medicine

## 2017-12-11 LAB — CEA: CEA: 1.2 ng/mL (ref 0.0–4.7)

## 2017-12-11 NOTE — Telephone Encounter (Signed)
Okay to keep surgery as rescheduled. I agree with getting new instructions regarding anticoagulation. Though both of these issues are out of the realm my care, I appreciate the update. Give them my best regards

## 2017-12-11 NOTE — Telephone Encounter (Signed)
Daughter aware.

## 2017-12-11 NOTE — Telephone Encounter (Signed)
Pts daughter called concerned because pt was scheduled for surgery this Friday with Dr. Marcello Moores but she has an emergency and it is going to have to be rescheduled. Possibly will be done the week of 12/26/17. Daughter wanted to make sure Dr. Henrene Pastor was aware. Daughter is going to contact cardiologist regarding blood thinners and resuming them since surgery will be moved. Dr. Henrene Pastor notified.

## 2017-12-13 ENCOUNTER — Telehealth: Payer: Self-pay

## 2017-12-13 NOTE — Telephone Encounter (Signed)
Pt called states his colectomy procedure scheduled for 12/14/17 has been rescheduled to 12/26/17.  Pt was instructed at last Coumadin check on 12/06/17 to hold Coumadin 5 days prior to procedure without Lovenox bridging.  Pt has been holding his Coumadin since 12/09/17.  Instructed pt to resume Coumadin today advised him to take 7.5mg  (1.5 tablets) today and tomorrow (an extra 1/2 tablet x 2 dosages), then resume his previous dosage regimen 5mg  daily except 2.5mg  on Sundays.  Advised pt he will need to hold his Coumadin 5 days prior to his new surgery date of 12/26/17, so last dosage of Coumadin will be on 12/20/17 and he will hold his Coumadin 12/21/17 until after procedure on 12/26/17.  Cancelled pt's follow-up appt scheduled for 12/21/17 and rescheduled it for 1 week after his new procedure date on 01/02/18 at 11:15 am.  Advised pt to resume Coumadin as instructed previously on 12/06/17 Coumadin AVS after procedure on 12/26/17 as instructed by surgeon performing procedure.  Pt verbalized understanding.

## 2017-12-14 LAB — TYPE AND SCREEN
ABO/RH(D): A POS
ANTIBODY SCREEN: NEGATIVE

## 2017-12-21 NOTE — Progress Notes (Signed)
Patient's daughter , Ferne Reus called and spoke with Hassan Rowan and stated her father wanted a new copy of the preop instructions he had received on 12/10/2017.  Informed daughter that I would be happy to provide her with a new copy of preop instructions for surgery on 12/26/2017.  She could come by [facility] and pick up .  Daughter stated that father had copy of preop instructions.  Informed daughter that I would only be changing on the preop instructions the date of surgery which would be 12/26/2017 and the time to report to Admitting which would be 0945am on 12/26/2017 and the time to complete Ensure preop; drink am of surgery which is 0915am. Daughter verbalized she had those instructions and did not need to come by to pick up another copy.  Daughter then verbalized that father was wanting a new copy of the bowel prep instructions he had received.  Daughter stated she had spoken with Marcellina Millin at [facility] stated a copy of instructions was being mailed to her father..  Daughter states she has not received.  Explained to daughter that bowel prep instructions come from office of Dr Marcello Moores.  Provided her with phone number of [phone] and to ohip option # 2 for Triage nurse and to explain to Triage nurse that her father wanted a new copy of the bowel prep instructions since his surgery date and time had changed.  Informed her also that at the hospital we do not have a copy of those bowel prep instructions on file .  Agaiin those come from office fo Dr Marcello Moores.  Allowed daughter to ventilate  Daughter stated " I live right down the street from Dr Henrene Pastor and I can text Dr Angelena Form regarding concerns for my father and I do not have a problem showing up in their driveway.  It seems like someone has dropped the ball regarding these instructions..  Again verbalized to daughter to call office of [facility] .  Daughter also stated ": [facility] : [facility] is like a well oiled machine when my husband had  health issues going on and he was there " compared to this.   Again verbalized to daughter to call office of [facility] and ask for Triage Nurse to obtain new copy of bowel prep instrucdtions.   Explained to daughter to follow same instructions he had received at preop for the hospital instructions except for the date and time changes.  Daughter voiced understanding.  Asked daughter to make sure pateint had received Coumadin and Plavix preop instrucdtions .  Daughter stated that those new preop instructions had been received since surgery date had changes.  Again allowed daughter to ventilate and conversation ended.   Called and spoke with Algis Liming, Advertising copywriter and made her aware of above.

## 2017-12-24 NOTE — Progress Notes (Signed)
Spoke with [name] ,triage at [facility] and [name] stated that on 12/21/2017 that daughter, [name] had called and that bowel prep instrudtions had been reviewed with daughter by staff and that copy had been left for her at front desk for her to pick up at [facility].   Called and LVMM for [name], daughter at [phone] asking if had any more questions or concerncers regarding father's surgery on 12/26/2017 to feel free to call [phone].

## 2017-12-26 ENCOUNTER — Other Ambulatory Visit: Payer: Self-pay

## 2017-12-26 ENCOUNTER — Encounter (HOSPITAL_COMMUNITY): Payer: Self-pay | Admitting: General Practice

## 2017-12-26 ENCOUNTER — Inpatient Hospital Stay (HOSPITAL_COMMUNITY): Payer: Medicare Other | Admitting: Anesthesiology

## 2017-12-26 ENCOUNTER — Telehealth (HOSPITAL_COMMUNITY): Payer: Self-pay | Admitting: *Deleted

## 2017-12-26 ENCOUNTER — Inpatient Hospital Stay (HOSPITAL_COMMUNITY)
Admission: RE | Admit: 2017-12-26 | Discharge: 2017-12-29 | DRG: 331 | Disposition: A | Discharge status: Home / Self Care | Payer: Medicare Other | Attending: General Surgery | Admitting: General Surgery

## 2017-12-26 ENCOUNTER — Encounter (HOSPITAL_COMMUNITY)
Admission: RE | Disposition: A | Discharge status: Home / Self Care | Payer: Self-pay | Source: Home / Self Care | Attending: General Surgery

## 2017-12-26 DIAGNOSIS — N400 Enlarged prostate without lower urinary tract symptoms: Secondary | ICD-10-CM | POA: Diagnosis present

## 2017-12-26 DIAGNOSIS — I2510 Atherosclerotic heart disease of native coronary artery without angina pectoris: Secondary | ICD-10-CM | POA: Diagnosis present

## 2017-12-26 DIAGNOSIS — I255 Ischemic cardiomyopathy: Secondary | ICD-10-CM | POA: Diagnosis present

## 2017-12-26 DIAGNOSIS — C187 Malignant neoplasm of sigmoid colon: Secondary | ICD-10-CM | POA: Diagnosis present

## 2017-12-26 DIAGNOSIS — Z952 Presence of prosthetic heart valve: Secondary | ICD-10-CM | POA: Diagnosis not present

## 2017-12-26 DIAGNOSIS — I739 Peripheral vascular disease, unspecified: Secondary | ICD-10-CM | POA: Diagnosis present

## 2017-12-26 DIAGNOSIS — I252 Old myocardial infarction: Secondary | ICD-10-CM | POA: Diagnosis not present

## 2017-12-26 DIAGNOSIS — E785 Hyperlipidemia, unspecified: Secondary | ICD-10-CM | POA: Diagnosis present

## 2017-12-26 DIAGNOSIS — C19 Malignant neoplasm of rectosigmoid junction: Secondary | ICD-10-CM | POA: Diagnosis not present

## 2017-12-26 DIAGNOSIS — Z951 Presence of aortocoronary bypass graft: Secondary | ICD-10-CM | POA: Diagnosis not present

## 2017-12-26 DIAGNOSIS — I4891 Unspecified atrial fibrillation: Secondary | ICD-10-CM | POA: Diagnosis present

## 2017-12-26 DIAGNOSIS — N189 Chronic kidney disease, unspecified: Secondary | ICD-10-CM | POA: Diagnosis present

## 2017-12-26 DIAGNOSIS — C189 Malignant neoplasm of colon, unspecified: Secondary | ICD-10-CM | POA: Diagnosis not present

## 2017-12-26 DIAGNOSIS — Z955 Presence of coronary angioplasty implant and graft: Secondary | ICD-10-CM | POA: Diagnosis not present

## 2017-12-26 DIAGNOSIS — Z7901 Long term (current) use of anticoagulants: Secondary | ICD-10-CM | POA: Diagnosis not present

## 2017-12-26 DIAGNOSIS — I129 Hypertensive chronic kidney disease with stage 1 through stage 4 chronic kidney disease, or unspecified chronic kidney disease: Secondary | ICD-10-CM | POA: Diagnosis present

## 2017-12-26 DIAGNOSIS — E538 Deficiency of other specified B group vitamins: Secondary | ICD-10-CM | POA: Diagnosis present

## 2017-12-26 DIAGNOSIS — I10 Essential (primary) hypertension: Secondary | ICD-10-CM | POA: Diagnosis not present

## 2017-12-26 DIAGNOSIS — Z87891 Personal history of nicotine dependence: Secondary | ICD-10-CM

## 2017-12-26 HISTORY — PX: LAPAROSCOPIC PARTIAL COLECTOMY: SHX5907

## 2017-12-26 HISTORY — PX: PROCTOSCOPY: SHX2266

## 2017-12-26 HISTORY — DX: Non-ST elevation (NSTEMI) myocardial infarction: I21.4

## 2017-12-26 LAB — TYPE AND SCREEN
ABO/RH(D): A POS
Antibody Screen: NEGATIVE

## 2017-12-26 LAB — PROTIME-INR
INR: 1.07
PROTHROMBIN TIME: 13.9 s (ref 11.4–15.2)

## 2017-12-26 SURGERY — LAPAROSCOPIC PARTIAL COLECTOMY
Anesthesia: General | Site: Rectum

## 2017-12-26 MED ORDER — DEXAMETHASONE SODIUM PHOSPHATE 10 MG/ML IJ SOLN
INTRAMUSCULAR | Status: DC | PRN
  Administered 2017-12-26: 10 mg via INTRAVENOUS

## 2017-12-26 MED ORDER — HYDROMORPHONE HCL 1 MG/ML IJ SOLN: 0.2500 mg | INTRAMUSCULAR | Status: DC | PRN

## 2017-12-26 MED ORDER — PROPOFOL 10 MG/ML IV BOLUS
INTRAVENOUS | Status: DC | PRN
  Administered 2017-12-26: 110 mg via INTRAVENOUS

## 2017-12-26 MED ORDER — BUPIVACAINE-EPINEPHRINE (PF) 0.25% -1:200000 IJ SOLN
INTRAMUSCULAR | Status: AC
  Filled 2017-12-26: qty 30

## 2017-12-26 MED ORDER — ALVIMOPAN 12 MG PO CAPS
12.0000 mg | ORAL_CAPSULE | ORAL | Status: AC
  Administered 2017-12-26: 12 mg via ORAL
  Filled 2017-12-26: qty 1

## 2017-12-26 MED ORDER — DEXTROSE 5 % IV SOLN
INTRAVENOUS | Status: DC | PRN
  Administered 2017-12-26: 25 ug/min via INTRAVENOUS

## 2017-12-26 MED ORDER — BUPIVACAINE-EPINEPHRINE (PF) 0.25% -1:200000 IJ SOLN
INTRAMUSCULAR | Status: DC | PRN
  Administered 2017-12-26: 30 mL

## 2017-12-26 MED ORDER — BUPIVACAINE LIPOSOME 1.3 % IJ SUSP
20.0000 mL | Freq: Once | INTRAMUSCULAR | Status: AC
  Administered 2017-12-26: 20 mL
  Filled 2017-12-26: qty 20

## 2017-12-26 MED ORDER — ALUM & MAG HYDROXIDE-SIMETH 200-200-20 MG/5ML PO SUSP
30.0000 mL | Freq: Four times a day (QID) | ORAL | Status: DC | PRN
  Administered 2017-12-27 – 2017-12-28 (×2): 30 mL via ORAL
  Filled 2017-12-26 (×2): qty 30

## 2017-12-26 MED ORDER — METOPROLOL SUCCINATE ER 50 MG PO TB24
75.0000 mg | ORAL_TABLET | Freq: Every day | ORAL | Status: DC
  Administered 2017-12-27 – 2017-12-29 (×3): 75 mg via ORAL
  Filled 2017-12-26 (×3): qty 1

## 2017-12-26 MED ORDER — ACETAMINOPHEN 500 MG PO TABS
1000.0000 mg | ORAL_TABLET | ORAL | Status: AC
  Administered 2017-12-26: 1000 mg via ORAL
  Filled 2017-12-26: qty 2

## 2017-12-26 MED ORDER — ONDANSETRON HCL 4 MG/2ML IJ SOLN: 4.0000 mg | Freq: Once | INTRAMUSCULAR | Status: DC | PRN

## 2017-12-26 MED ORDER — LIDOCAINE 2% (20 MG/ML) 5 ML SYRINGE
INTRAMUSCULAR | Status: DC | PRN
  Administered 2017-12-26: 1.5 mg/kg/h via INTRAVENOUS

## 2017-12-26 MED ORDER — FENTANYL CITRATE (PF) 100 MCG/2ML IJ SOLN
INTRAMUSCULAR | Status: DC | PRN
  Administered 2017-12-26 (×3): 100 ug via INTRAVENOUS
  Administered 2017-12-26 (×2): 50 ug via INTRAVENOUS

## 2017-12-26 MED ORDER — MEPERIDINE HCL 50 MG/ML IJ SOLN: 6.2500 mg | INTRAMUSCULAR | Status: DC | PRN

## 2017-12-26 MED ORDER — LACTATED RINGERS IV SOLN
INTRAVENOUS | Status: DC | PRN
  Administered 2017-12-26 (×2): via INTRAVENOUS

## 2017-12-26 MED ORDER — SODIUM CHLORIDE 0.9 % IV SOLN
2.0000 g | INTRAVENOUS | Status: AC
  Administered 2017-12-26: 2 g via INTRAVENOUS
  Filled 2017-12-26: qty 2

## 2017-12-26 MED ORDER — FENTANYL CITRATE (PF) 250 MCG/5ML IJ SOLN
INTRAMUSCULAR | Status: AC
  Filled 2017-12-26: qty 5

## 2017-12-26 MED ORDER — 0.9 % SODIUM CHLORIDE (POUR BTL) OPTIME
TOPICAL | Status: DC | PRN
  Administered 2017-12-26: 2000 mL

## 2017-12-26 MED ORDER — LACTATED RINGERS IV SOLN
Freq: Once | INTRAVENOUS | Status: AC
  Administered 2017-12-26: 11:00:00 via INTRAVENOUS

## 2017-12-26 MED ORDER — SACCHAROMYCES BOULARDII 250 MG PO CAPS
250.0000 mg | ORAL_CAPSULE | Freq: Two times a day (BID) | ORAL | Status: DC
  Administered 2017-12-26 – 2017-12-29 (×6): 250 mg via ORAL
  Filled 2017-12-26 (×7): qty 1

## 2017-12-26 MED ORDER — ROCURONIUM BROMIDE 10 MG/ML (PF) SYRINGE
PREFILLED_SYRINGE | INTRAVENOUS | Status: DC | PRN
  Administered 2017-12-26: 50 mg via INTRAVENOUS

## 2017-12-26 MED ORDER — ONDANSETRON HCL 4 MG/2ML IJ SOLN
INTRAMUSCULAR | Status: DC | PRN
  Administered 2017-12-26: 4 mg via INTRAVENOUS

## 2017-12-26 MED ORDER — ACETAMINOPHEN 500 MG PO TABS
1000.0000 mg | ORAL_TABLET | Freq: Four times a day (QID) | ORAL | Status: DC
  Administered 2017-12-26 – 2017-12-29 (×11): 1000 mg via ORAL
  Filled 2017-12-26 (×11): qty 2

## 2017-12-26 MED ORDER — HYDROMORPHONE HCL 1 MG/ML IJ SOLN: 0.5000 mg | INTRAMUSCULAR | Status: DC | PRN

## 2017-12-26 MED ORDER — ENSURE SURGERY PO LIQD
237.0000 mL | Freq: Two times a day (BID) | ORAL | Status: DC
  Administered 2017-12-27 – 2017-12-29 (×5): 237 mL via ORAL
  Filled 2017-12-26 (×6): qty 237

## 2017-12-26 MED ORDER — DIPHENHYDRAMINE HCL 12.5 MG/5ML PO ELIX: 12.5000 mg | ORAL_SOLUTION | Freq: Four times a day (QID) | ORAL | Status: DC | PRN

## 2017-12-26 MED ORDER — KCL IN DEXTROSE-NACL 20-5-0.45 MEQ/L-%-% IV SOLN
INTRAVENOUS | Status: DC
  Administered 2017-12-26: 17:00:00 via INTRAVENOUS
  Administered 2017-12-27: 75 mL/h via INTRAVENOUS
  Administered 2017-12-28: 01:00:00 via INTRAVENOUS
  Filled 2017-12-26 (×3): qty 1000

## 2017-12-26 MED ORDER — ONDANSETRON HCL 4 MG PO TABS: 4.0000 mg | ORAL_TABLET | Freq: Four times a day (QID) | ORAL | Status: DC | PRN

## 2017-12-26 MED ORDER — ONDANSETRON HCL 4 MG/2ML IJ SOLN: 4.0000 mg | Freq: Four times a day (QID) | INTRAMUSCULAR | Status: DC | PRN

## 2017-12-26 MED ORDER — DIPHENHYDRAMINE HCL 50 MG/ML IJ SOLN: 12.5000 mg | Freq: Four times a day (QID) | INTRAMUSCULAR | Status: DC | PRN

## 2017-12-26 MED ORDER — SODIUM CHLORIDE 0.9 % IV SOLN
2.0000 g | Freq: Two times a day (BID) | INTRAVENOUS | Status: AC
  Administered 2017-12-26: 2 g via INTRAVENOUS
  Filled 2017-12-26: qty 2

## 2017-12-26 MED ORDER — GABAPENTIN 300 MG PO CAPS
300.0000 mg | ORAL_CAPSULE | ORAL | Status: AC
  Administered 2017-12-26: 300 mg via ORAL
  Filled 2017-12-26: qty 1

## 2017-12-26 MED ORDER — ENOXAPARIN SODIUM 40 MG/0.4ML ~~LOC~~ SOLN
40.0000 mg | SUBCUTANEOUS | Status: DC
  Administered 2017-12-27 – 2017-12-29 (×3): 40 mg via SUBCUTANEOUS
  Filled 2017-12-26 (×3): qty 0.4

## 2017-12-26 MED ORDER — ALVIMOPAN 12 MG PO CAPS
12.0000 mg | ORAL_CAPSULE | Freq: Two times a day (BID) | ORAL | Status: DC
  Administered 2017-12-27 (×2): 12 mg via ORAL
  Filled 2017-12-26 (×2): qty 1

## 2017-12-26 MED ORDER — LIDOCAINE 2% (20 MG/ML) 5 ML SYRINGE
INTRAMUSCULAR | Status: DC | PRN
  Administered 2017-12-26: 50 mg via INTRAVENOUS
  Administered 2017-12-26: 25 mg via INTRAVENOUS

## 2017-12-26 MED ORDER — LACTATED RINGERS IR SOLN
Status: DC | PRN
  Administered 2017-12-26: 1000 mL

## 2017-12-26 MED ORDER — LIDOCAINE 2% (20 MG/ML) 5 ML SYRINGE
INTRAMUSCULAR | Status: AC
  Filled 2017-12-26: qty 15

## 2017-12-26 SURGICAL SUPPLY — 68 items
ADH SKN CLS APL DERMABOND .7 (GAUZE/BANDAGES/DRESSINGS) ×1
APPLIER CLIP 5 13 M/L LIGAMAX5 (MISCELLANEOUS)
APR CLP MED LRG 5 ANG JAW (MISCELLANEOUS)
BLADE EXTENDED COATED 6.5IN (ELECTRODE) IMPLANT
CABLE HIGH FREQUENCY MONO STRZ (ELECTRODE) ×3 IMPLANT
CELLS DAT CNTRL 66122 CELL SVR (MISCELLANEOUS) IMPLANT
CHLORAPREP W/TINT 26ML (MISCELLANEOUS) ×3 IMPLANT
CLIP APPLIE 5 13 M/L LIGAMAX5 (MISCELLANEOUS) IMPLANT
DECANTER SPIKE VIAL GLASS SM (MISCELLANEOUS) ×3 IMPLANT
DERMABOND ADVANCED (GAUZE/BANDAGES/DRESSINGS) ×1
DERMABOND ADVANCED .7 DNX12 (GAUZE/BANDAGES/DRESSINGS) ×2 IMPLANT
DRAIN CHANNEL 19F RND (DRAIN) IMPLANT
DRAPE LAPAROSCOPIC ABDOMINAL (DRAPES) ×3 IMPLANT
DRAPE SURG IRRIG POUCH 19X23 (DRAPES) ×3 IMPLANT
DRSG OPSITE POSTOP 4X10 (GAUZE/BANDAGES/DRESSINGS) IMPLANT
DRSG OPSITE POSTOP 4X6 (GAUZE/BANDAGES/DRESSINGS) ×3 IMPLANT
DRSG OPSITE POSTOP 4X8 (GAUZE/BANDAGES/DRESSINGS) IMPLANT
ELECT PENCIL ROCKER SW 15FT (MISCELLANEOUS) ×6 IMPLANT
ELECT REM PT RETURN 15FT ADLT (MISCELLANEOUS) ×3 IMPLANT
EVACUATOR SILICONE 100CC (DRAIN) IMPLANT
GAUZE SPONGE 4X4 12PLY STRL (GAUZE/BANDAGES/DRESSINGS) IMPLANT
GLOVE BIO SURGEON STRL SZ 6.5 (GLOVE) ×6 IMPLANT
GLOVE BIOGEL PI IND STRL 7.0 (GLOVE) ×4 IMPLANT
GLOVE BIOGEL PI INDICATOR 7.0 (GLOVE) ×2
GOWN STRL REUS W/TWL 2XL LVL3 (GOWN DISPOSABLE) ×6 IMPLANT
GOWN STRL REUS W/TWL XL LVL3 (GOWN DISPOSABLE) ×12 IMPLANT
GRASPER ENDOPATH ANVIL 10MM (MISCELLANEOUS) IMPLANT
HANDLE STAPLE EGIA 4 XL (STAPLE) ×3 IMPLANT
HOLDER FOLEY CATH W/STRAP (MISCELLANEOUS) ×3 IMPLANT
IRRIG SUCT STRYKERFLOW 2 WTIP (MISCELLANEOUS) ×3
IRRIGATION SUCT STRKRFLW 2 WTP (MISCELLANEOUS) ×2 IMPLANT
KIT SIGMOIDOSCOPE (SET/KITS/TRAYS/PACK) ×3 IMPLANT
LUBRICANT JELLY K Y 4OZ (MISCELLANEOUS) ×3 IMPLANT
PACK COLON (CUSTOM PROCEDURE TRAY) ×3 IMPLANT
PAD POSITIONING PINK XL (MISCELLANEOUS) ×3 IMPLANT
PORT LAP GEL ALEXIS MED 5-9CM (MISCELLANEOUS) ×3 IMPLANT
POSITIONER SURGICAL ARM (MISCELLANEOUS) ×3 IMPLANT
RELOAD EGIA 60 MED/THCK PURPLE (STAPLE) ×3 IMPLANT
RTRCTR WOUND ALEXIS 18CM MED (MISCELLANEOUS)
SCISSORS LAP 5X35 DISP (ENDOMECHANICALS) ×3 IMPLANT
SEALER TISSUE G2 STRG ARTC 35C (ENDOMECHANICALS) ×3 IMPLANT
SLEEVE XCEL OPT CAN 5 100 (ENDOMECHANICALS) ×6 IMPLANT
SPONGE DRAIN TRACH 4X4 STRL 2S (GAUZE/BANDAGES/DRESSINGS) IMPLANT
SPONGE LAP 18X18 RF (DISPOSABLE) IMPLANT
STAPLER VISISTAT 35W (STAPLE) ×3 IMPLANT
SUT ETHILON 2 0 PS N (SUTURE) IMPLANT
SUT NOVA NAB GS-21 0 18 T12 DT (SUTURE) ×6 IMPLANT
SUT PDS AB 1 CTX 36 (SUTURE) IMPLANT
SUT PDS AB 1 TP1 96 (SUTURE) IMPLANT
SUT PROLENE 2 0 KS (SUTURE) ×3 IMPLANT
SUT SILK 2 0 (SUTURE) ×3
SUT SILK 2 0 SH CR/8 (SUTURE) ×3 IMPLANT
SUT SILK 2-0 18XBRD TIE 12 (SUTURE) ×2 IMPLANT
SUT SILK 3 0 (SUTURE) ×2
SUT SILK 3 0 SH CR/8 (SUTURE) ×3 IMPLANT
SUT SILK 3-0 18XBRD TIE 12 (SUTURE) ×2 IMPLANT
SUT VIC AB 2-0 SH 18 (SUTURE) IMPLANT
SUT VIC AB 2-0 SH 27 (SUTURE) ×3
SUT VIC AB 2-0 SH 27X BRD (SUTURE) ×2 IMPLANT
SUT VIC AB 4-0 PS2 27 (SUTURE) ×3 IMPLANT
SYS LAPSCP GELPORT 120MM (MISCELLANEOUS)
SYSTEM LAPSCP GELPORT 120MM (MISCELLANEOUS) IMPLANT
TOWEL OR NON WOVEN STRL DISP B (DISPOSABLE) ×3 IMPLANT
TRAY FOLEY MTR SLVR 16FR STAT (SET/KITS/TRAYS/PACK) ×3 IMPLANT
TROCAR BLADELESS OPT 5 100 (ENDOMECHANICALS) ×3 IMPLANT
TROCAR XCEL BLUNT TIP 100MML (ENDOMECHANICALS) IMPLANT
TUBING CONNECTING 10 (TUBING) ×3 IMPLANT
TUBING INSUF HEATED (TUBING) ×3 IMPLANT

## 2017-12-26 NOTE — Anesthesia Procedure Notes (Signed)
Procedure Name: Intubation Date/Time: 12/26/2017 12:14 PM Performed by: Lissa Morales, CRNA Pre-anesthesia Checklist: Patient identified, Emergency Drugs available, Suction available and Patient being monitored Patient Re-evaluated:Patient Re-evaluated prior to induction Oxygen Delivery Method: Circle system utilized Preoxygenation: Pre-oxygenation with 100% oxygen Induction Type: IV induction Ventilation: Mask ventilation without difficulty Laryngoscope Size: Mac and 4 Grade View: Grade II Tube type: Oral Tube size: 7.5 mm Number of attempts: 1 Airway Equipment and Method: Stylet and Oral airway Placement Confirmation: ETT inserted through vocal cords under direct vision,  positive ETCO2 and breath sounds checked- equal and bilateral Secured at: 22 cm Tube secured with: Tape Dental Injury: Teeth and Oropharynx as per pre-operative assessment

## 2017-12-26 NOTE — Transfer of Care (Signed)
Immediate Anesthesia Transfer of Care Note  Patient: [name]  Procedure(s) Performed: LAPAROSCOPIC PARTIAL COLECTOMY ERAS PATHWAY (N/A Abdomen) PROCTOSCOPY (N/A Rectum)  Patient Location: PACU  Anesthesia Type:General  Level of Consciousness: awake, alert , oriented and patient cooperative  Airway & Oxygen Therapy: Patient Spontanous Breathing and Patient connected to face mask oxygen  Post-op Assessment: Report given to RN, Post -op Vital signs reviewed and stable and Patient moving all extremities X 4  Post vital signs: stable  Last Vitals:  Vitals Value Taken Time  BP 125/93 12/26/2017  2:15 PM  Temp    Pulse 113 12/26/2017  2:21 PM  Resp 19 12/26/2017  2:21 PM  SpO2 97 % 12/26/2017  2:21 PM  Vitals shown include unvalidated device data.  Last Pain:  Vitals:   12/26/17 1112  TempSrc:   PainSc: 0-No pain         Complications: No apparent anesthesia complications

## 2017-12-26 NOTE — Op Note (Signed)
12/26/2017  1:57 PM  PATIENT:  [name]  84 y.o. male  Patient Care Team: Marton Redwood, MD as PCP - General (Internal Medicine) Burnell Blanks, MD as PCP - Cardiology (Cardiology)  PRE-OPERATIVE DIAGNOSIS:  colon cancer  POST-OPERATIVE DIAGNOSIS:  colon cancer  PROCEDURE:  LAPAROSCOPIC SIGMOIDECTOMY    Surgeon(s): Leighton Ruff, MD Coralie Keens, MD  ASSISTANT: Dr Ninfa Linden   ANESTHESIA:   local and general  EBL: 50 ml  Total I/O In: -  Out: 175 [Urine:125; Blood:50]  Delay start of Pharmacological VTE agent (>24hrs) due to surgical blood loss or risk of bleeding:  no  DRAINS: none   SPECIMEN:  Source of Specimen:  Rectosigmoid colon  DISPOSITION OF SPECIMEN:  PATHOLOGY  COUNTS:  YES  PLAN OF CARE: Admit to inpatient   PATIENT DISPOSITION:  PACU - hemodynamically stable.  INDICATION:    84 y.o. M with sigmoid colon cancer.  I recommended segmental resection:  The anatomy & physiology of the digestive tract was discussed.  The pathophysiology was discussed.  Natural history risks without surgery was discussed.   I worked to give an overview of the disease and the frequent need to have multispecialty involvement.  I feel the risks of no intervention will lead to serious problems that outweigh the operative risks; therefore, I recommended a partial colectomy to remove the pathology.  Laparoscopic & open techniques were discussed.   Risks such as bleeding, infection, abscess, leak, reoperation, possible ostomy, hernia, heart attack, death, and other risks were discussed.  I noted a good likelihood this will help address the problem.   Goals of post-operative recovery were discussed as well.    The patient expressed understanding & wished to proceed with surgery.  OR FINDINGS:   Patient had tattoo located at rectosigmoid junction  No obvious metastatic disease on visceral parietal peritoneum or liver.  The anastomosis rests ~16 cm from the anal  verge by rigid proctoscopy.  DESCRIPTION:   Informed consent was confirmed.  The patient underwent general anaesthesia without difficulty.  The patient was positioned appropriately.  VTE prevention in place.  The patient's abdomen was clipped, prepped, & draped in a sterile fashion.  Surgical timeout confirmed our plan.  The patient was positioned in reverse Trendelenburg.  Abdominal entry was gained using a Pfannenstiel incision placed approximately 2 cm above the pubic bone.  Entry was clean.  I placed an [place] wound protector and cap as well as the 10 mm port.  I induced carbon dioxide insufflation.  Camera inspection revealed no injury.  Extra 5 mm ports were carefully placed under direct laparoscopic visualization.   I reflected the greater omentum and the upper abdomen the small bowel in the upper abdomen. I scored the base of peritoneum of the right side of the mesentery of the left colon from the ligament of Treitz to the peritoneal reflection of the mid rectum.  The patient had tattoo located in the pelvis just below the rectosigmoid junction and above the peritoneal reflection.  I elevated the sigmoid mesentery and enetered into the retro-mesenteric plane. We were able to identify the left ureter and gonadal vessels. We kept those posterior within the retroperitoneum and elevated the left colon mesentery off that. I did isolated IMA pedicle but did not ligate it yet.  I continued distally and got into the avascular plane posterior to the mesorectum. This allowed me to help mobilize the rectum as well by freeing the mesorectum off the sacrum.  I mobilized  the peritoneal coverings towards the peritoneal reflection on both the right and left sides of the rectum.  I could see the right and left ureters and stayed away from them.    I skeletonized the inferior mesenteric artery pedicle.  I went down to its takeoff from the aorta. After confirming the left ureter was out of the way, I went ahead and  ligated the inferior mesenteric artery pedicle with bipolar EnSeal ~2cm above its takeoff from the aorta.  We ensured hemostasis. I skeletonized the mesorectum just below the rectosigmoid using blunt dissection & bipolar EnSeal.  I then divided this proximal rectum with a 60 mm purple load laparoscopic Covidien stapler.  I mobilized the left colon in a lateral to medial fashion off the line of Toldt up towards the splenic flexure to ensure good mobilization of the left colon to reach into the pelvis.   Once this was completed, the cap was taken off of the [place] wound protector and the colon was removed.  I divided the mesentery to the level of the junction of the descending colon and sigmoid colon using the Enseal device.  I divided the colon at this level using a pursestring device.  The specimen was sent to pathology for further examination.    I did evaluate for perfusion prior to placing the anvil.  There was a pulsatile vessel noted in the mesentery and when I divided the mucosa with Metzenbaum scissors internally good bleeding was noted. A 2-0 Prolene pursestring was placed and tied tightly around a 31 mm EEA anvil. This was then placed back into the pelvis.  An anastomosis was created between the anterior rectal wall and the descending colon.  There was no tension on the anastomosis.  There was no leak when tested with insufflation under irrigation.  The anastomosis rests approximately 16 cm from the anal verge.  The abdomen pelvis was irrigated with normal saline.  Hemostasis was good.  We then switched to clean gowns, gloves, instruments and drapes.  The peritoneum was closed using a running 2-0 Vicryl suture.  The fascia was closed using interrupted #1 Novafil sutures.  Subcutaneous tissue was reapproximated with a 2-0 running Vicryl suture and the skin was closed with a running 4-0 Vicryl subcuticular suture.  The port sites were also closed using 4-0 Vicryl subcuticular sutures and Dermabond.  A  sterile dressing was placed on the extraction site.  The patient was then awakened from anesthesia and sent to the postanesthesia care unit stable condition.  All counts were correct per operating room staff.  An MD assistant was necessary for tissue manipulation, retraction and positioning due to the complexity of the case and hospital policies

## 2017-12-26 NOTE — Anesthesia Preprocedure Evaluation (Signed)
Anesthesia Evaluation  Patient identified by MRN, date of birth, ID band Patient awake    Reviewed: Allergy & Precautions, NPO status , Patient's Chart, lab work & pertinent test results  Airway Mallampati: I  TM Distance: >3 FB Neck ROM: Full    Dental   Pulmonary former smoker,    Pulmonary exam normal        Cardiovascular hypertension, + CAD and + Past MI  Normal cardiovascular exam+ dysrhythmias Atrial Fibrillation      Neuro/Psych    GI/Hepatic   Endo/Other    Renal/GU Renal InsufficiencyRenal disease     Musculoskeletal   Abdominal   Peds  Hematology   Anesthesia Other Findings   Reproductive/Obstetrics                             Anesthesia Physical Anesthesia Plan  ASA: III  Anesthesia Plan: General   Post-op Pain Management:    Induction: Intravenous  PONV Risk Score and Plan: 2 and Ondansetron and Treatment may vary due to age or medical condition  Airway Management Planned: Oral ETT  Additional Equipment:   Intra-op Plan:   Post-operative Plan: Extubation in OR  Informed Consent: I have reviewed the patients History and Physical, chart, labs and discussed the procedure including the risks, benefits and alternatives for the proposed anesthesia with the patient or authorized representative who has indicated his/her understanding and acceptance.     Plan Discussed with: CRNA and Surgeon  Anesthesia Plan Comments:         Anesthesia Quick Evaluation

## 2017-12-26 NOTE — H&P (Signed)
The patient is a 84 year old male who presents with colorectal cancer. 84 year old male who presents to the office today with a newly diagnosed sigmoid colon cancer found on colonoscopy by Dr. Henrene Pastor. Previously this year he had developed some hematochezia and was referred for a virtual colonoscopy. This showed a mass in his sigmoid colon. A flexible sigmoidoscopy was performed which did demonstrate the mass. Biopsies were taken and a tattoo was placed distally. Since that time he has had minimal bleeding but does endorse some abdominal pain and increased frequency of bowel habits. He also has noted a decrease in stool caliber.   Problem List/Past Medical MALIGNANT NEOPLASM OF SIGMOID COLON (C18.7)  Past Surgical History  Bypass Surgery for Poor Blood Flow to Legs Coronary Artery Bypass Graft Open Inguinal Hernia Surgery Right. Oral Surgery Valve Replacement  Allergies  Allergies Reconciled No Known Drug Allergies [11/19/2017]:  Medication History  Atorvastatin Calcium (40MG  Tablet, Oral) Active. Clopidogrel Bisulfate (75MG  Tablet, Oral) Active. Hydrocortisone (0.25% Cream, External) Active. Metoprolol Succinate ER (25MG  Tablet ER 24HR, Oral) Active.   Social History  Alcohol use Moderate alcohol use. Caffeine use Carbonated beverages, Coffee, Tea. Tobacco use Former smoker.  Family History  Alcohol Abuse Daughter, Family Members In [place], Son. Bleeding disorder Son. Cancer Mother. Colon Polyps Son. Heart Disease Father. Heart disease in male family member before age 65 Heart disease in male family member before age 61  Other Problems  Arthritis Atrial Fibrillation Back Pain Bladder Problems Congestive Heart Failure Enlarged Prostate Gastroesophageal Reflux Disease Melanoma Myocardial infarction     Review of Systems  General Present- Chills and Fatigue. Not Present- Appetite Loss, Fever, Night Sweats, Weight  Gain and Weight Loss. Skin Present- Change in Wart/Mole and Dryness. Not Present- Hives, Jaundice, New Lesions, Non-Healing Wounds, Rash and Ulcer. HEENT Present- Hearing Loss and Visual Disturbances. Not Present- Earache, Hoarseness, Nose Bleed, Oral Ulcers, Ringing in the Ears, Seasonal Allergies, Sinus Pain, Sore Throat, Wears glasses/contact lenses and Yellow Eyes. Respiratory Present- Wheezing. Not Present- Bloody sputum, Chronic Cough, Difficulty Breathing and Snoring. Breast Not Present- Breast Mass, Breast Pain, Nipple Discharge and Skin Changes. Cardiovascular Present- Leg Cramps, Rapid Heart Rate and Swelling of Extremities. Not Present- Chest Pain, Difficulty Breathing Lying Down, Palpitations and Shortness of Breath. Gastrointestinal Present- Abdominal Pain, Bloody Stool, Change in Bowel Habits, Constipation, Excessive gas, Gets full quickly at meals and Indigestion. Not Present- Bloating, Chronic diarrhea, Difficulty Swallowing, Hemorrhoids, Nausea, Rectal Pain and Vomiting. Male Genitourinary Present- Change in Urinary Stream, Frequency, Nocturia, Urgency and Urine Leakage. Not Present- Blood in Urine, Impotence and Painful Urination. Musculoskeletal Present- Back Pain, Joint Pain, Joint Stiffness and Swelling of Extremities. Not Present- Muscle Pain and Muscle Weakness. Neurological Present- Decreased Memory, Tremor, Trouble walking and Weakness. Not Present- Fainting, Headaches, Numbness, Seizures and Tingling. Endocrine Present- Cold Intolerance. Not Present- Excessive Hunger, Hair Changes, Heat Intolerance, Hot flashes and New Diabetes. Hematology Present- Blood Thinners and Easy Bruising. Not Present- Excessive bleeding, Gland problems, HIV and Persistent Infections.  BP (!) 162/68   Pulse (!) 49   Temp 97.7 F (36.5 C) (Oral)   Resp 16   Ht 5\' 11"  (1.803 m)   Wt 77.6 kg (171 lb)   SpO2 99%   BMI 23.85 kg/m     Physical Exam  General Mental Status-Alert. General  Appearance-Not in acute distress. Build & Nutrition-Well nourished. Posture-Normal posture. Gait-Normal.  Head and Neck Head-normocephalic, atraumatic with no lesions or palpable masses. Trachea-midline.  Chest and Lung Exam Chest  and lung exam reveals -on auscultation, normal breath sounds, no adventitious sounds and normal vocal resonance.  Cardiovascular Cardiovascular examination reveals -normal heart sounds, regular rate and rhythm with no murmurs and no digital clubbing, cyanosis, edema, increased warmth or tenderness.  Abdomen Inspection Inspection of the abdomen reveals - No Hernias. Palpation/Percussion Palpation and Percussion of the abdomen reveal - Soft, Non Tender, No Rigidity (guarding), No hepatosplenomegaly and No Palpable abdominal masses.  Neurologic Neurologic evaluation reveals -alert and oriented x 3 with no impairment of recent or remote memory, normal attention span and ability to concentrate, normal sensation and normal coordination.  Musculoskeletal Normal Exam - Bilateral-Upper Extremity Strength Normal and Lower Extremity Strength Normal.    Assessment & Plan   MALIGNANT NEOPLASM OF SIGMOID COLON (C18.7) Impression: 84 year old male with a newly diagnosed sigmoid colon cancer found after a bout of hematochezia. Flexible sigmoidoscopy was performed and showed adenocarcinoma approximately 20 cm from anal verge. This was tattooed. The patient has significant cardiac disease, atrial fibrillation and is on chronic anticoagulation with Plavix and Coumadin. I think he would be a reasonable candidate for sigmoidectomy. This can be performed a minimally invasive fashion. We will obtain cardiac clearance prior to surgery as well as directions on managing his anticoagulation. He will undergo CT scans of chest abdomen and pelvis tomorrow and we will review those. There is no sign of metastatic disease, we will plan to proceed with surgery.  We will get a CEA level on his preop laboratory work. The surgery and anatomy were described to the patient as well as the risks of surgery and the possible complications. These include: Bleeding, deep abdominal infections and possible wound complications such as hernia and infection, damage to adjacent structures, leak of surgical connections, which can lead to other surgeries and possibly an ostomy, possible need for other procedures, such as abscess drains in radiology, possible prolonged hospital stay, possible diarrhea from removal of part of the colon, possible constipation from narcotics, possible bowel, bladder or sexual dysfunction if having rectal surgery, prolonged fatigue/weakness or appetite loss, possible early recurrence of of disease, possible complications of their medical problems such as heart disease or arrhythmias or lung problems, death (less than 1%). I believe the patient understands and wishes to proceed with the surgery.

## 2017-12-27 LAB — BASIC METABOLIC PANEL
Anion gap: 9 (ref 5–15)
BUN: 20 mg/dL (ref 6–20)
CHLORIDE: 105 mmol/L (ref 101–111)
CO2: 27 mmol/L (ref 22–32)
Calcium: 8.6 mg/dL — ABNORMAL LOW (ref 8.9–10.3)
Creatinine, Ser: 1.45 mg/dL — ABNORMAL HIGH (ref 0.61–1.24)
GFR calc Af Amer: 49 mL/min — ABNORMAL LOW (ref >60)
GFR, EST NON AFRICAN AMERICAN: 42 mL/min — AB (ref >60)
GLUCOSE: 210 mg/dL — AB (ref 65–99)
POTASSIUM: 5 mmol/L (ref 3.5–5.1)
Sodium: 141 mmol/L (ref 135–145)

## 2017-12-27 LAB — CBC
HEMATOCRIT: 40.1 % (ref 39.0–52.0)
Hemoglobin: 12.8 g/dL — ABNORMAL LOW (ref 13.0–17.0)
MCH: 27.7 pg (ref 26.0–34.0)
MCHC: 31.9 g/dL (ref 30.0–36.0)
MCV: 86.8 fL (ref 78.0–100.0)
Platelets: 163 10*3/uL (ref 150–400)
RBC: 4.62 MIL/uL (ref 4.22–5.81)
RDW: 14.8 % (ref 11.5–15.5)
WBC: 10 10*3/uL (ref 4.0–10.5)

## 2017-12-27 MED ORDER — WARFARIN SODIUM 5 MG PO TABS
5.0000 mg | ORAL_TABLET | ORAL | Status: DC
  Administered 2017-12-27 – 2017-12-28 (×2): 5 mg via ORAL
  Filled 2017-12-27 (×2): qty 1

## 2017-12-27 MED ORDER — WARFARIN SODIUM 5 MG PO TABS: 5.0000 mg | ORAL_TABLET | Freq: Every day | ORAL | Status: DC

## 2017-12-27 MED ORDER — WARFARIN - PHYSICIAN DOSING INPATIENT: Freq: Every day | Status: DC

## 2017-12-27 MED ORDER — WARFARIN SODIUM 2.5 MG PO TABS: 2.5000 mg | ORAL_TABLET | ORAL | Status: DC

## 2017-12-27 NOTE — Anesthesia Postprocedure Evaluation (Signed)
Anesthesia Post Note  Patient: [name]  Procedure(s) Performed: LAPAROSCOPIC PARTIAL COLECTOMY ERAS PATHWAY (N/A Abdomen) PROCTOSCOPY (N/A Rectum)     Patient location during evaluation: PACU Anesthesia Type: General Level of consciousness: awake and alert Pain management: pain level controlled Vital Signs Assessment: post-procedure vital signs reviewed and stable Respiratory status: spontaneous breathing, nonlabored ventilation, respiratory function stable and patient connected to nasal cannula oxygen Cardiovascular status: blood pressure returned to baseline and stable Postop Assessment: no apparent nausea or vomiting Anesthetic complications: no Comments: Pt in Afib in PACU. Dr Marcello Moores aware and will manage.    Last Vitals:  Vitals:   12/27/17 0126 12/27/17 0600  BP: 110/66 129/71  Pulse: 87 67  Resp: 16 17  Temp: 37.2 C 36.4 C  SpO2: 97% 100%    Last Pain:  Vitals:   12/27/17 0802  TempSrc:   PainSc: 0-No pain                 Layani Foronda DAVID

## 2017-12-27 NOTE — Progress Notes (Signed)
1 Day Post-Op lap sigmoidectomy Subjective: Min pain.  No nausea.  Tolerating clears.  Passing flatus.  Objective: Vital signs in last 24 hours: Temp:  [97.3 F (36.3 C)-98.9 F (37.2 C)] 97.6 F (36.4 C) (06/20 0600) Pulse Rate:  [49-131] 67 (06/20 0600) Resp:  [11-21] 17 (06/20 0600) BP: (106-162)/(66-93) 129/71 (06/20 0600) SpO2:  [92 %-100 %] 100 % (06/20 0600) Weight:  [77.6 kg (171 lb)] 77.6 kg (171 lb) (06/19 1045)   Intake/Output from previous day: 06/19 0701 - 06/20 0700 In: 2100 [P.O.:120; I.V.:1980] Out: 8016 [Urine:1575; Blood:100] Intake/Output this shift: No intake/output data recorded.   General appearance: alert and cooperative GI: normal findings: soft, distended  Incision: no significant drainage  Lab Results:  Recent Labs    12/27/17 0432  WBC 10.0  HGB 12.8*  HCT 40.1  PLT 163   BMET Recent Labs    12/27/17 0432  NA 141  K 5.0  CL 105  CO2 27  GLUCOSE 210*  BUN 20  CREATININE 1.45*  CALCIUM 8.6*   PT/INR Recent Labs    12/26/17 1100  LABPROT 13.9  INR 1.07   ABG No results for input(s): PHART, HCO3 in the last 72 hours.  Invalid input(s): PCO2, PO2  MEDS, Scheduled . acetaminophen  1,000 mg Oral Q6H  . alvimopan  12 mg Oral BID  . enoxaparin (LOVENOX) injection  40 mg Subcutaneous Q24H  . feeding supplement  237 mL Oral BID BM  . metoprolol succinate  75 mg Oral Daily  . saccharomyces boulardii  250 mg Oral BID    Studies/Results: No results found.  Assessment: s/p Procedure(s): LAPAROSCOPIC PARTIAL COLECTOMY ERAS PATHWAY PROCTOSCOPY Patient Active Problem List   Diagnosis Date Noted  . Colon cancer ([place]) 12/26/2017  . Colonic mass   . Long term (current) use of anticoagulants [Z79.01] 08/15/2017  . Ischemic cardiomyopathy 08/13/2017  . Hypertension 08/13/2017  . Non-ST elevation (NSTEMI) myocardial infarction ([place])   . Atrial fibrillation ([place]) 08/08/2017  . Hyperlipidemia 11/09/2009  . LBBB 11/09/2009  .  HYPERTENSION, BENIGN 11/27/2008  . Coronary atherosclerosis 10/05/2008  . DIZZINESS 10/05/2008  . CHEST PAIN-UNSPECIFIED 10/05/2008    Expected post op course   Plan: Advance diet to fulls Decrease MIV Ambulate    LOS: 1 day     .Rosario Adie, [facility], [place]   12/27/2017 8:42 AM

## 2017-12-27 NOTE — Progress Notes (Signed)
PT Cancellation Note  Patient Details Name: [name] MRN: [id number] DOB: 12-May-1930   Cancelled Treatment:    Reason Eval/Treat Not Completed: PT screened, no needs identified, will sign off, per RN, patient ambulates without assistance other than catheter.    Claretha Cooper 12/27/2017, 3:04 PM Tresa Endo PT [phone]

## 2017-12-28 LAB — BASIC METABOLIC PANEL
ANION GAP: 3 — AB (ref 5–15)
BUN: 26 mg/dL — AB (ref 6–20)
CHLORIDE: 106 mmol/L (ref 101–111)
CO2: 30 mmol/L (ref 22–32)
Calcium: 8.5 mg/dL — ABNORMAL LOW (ref 8.9–10.3)
Creatinine, Ser: 1.4 mg/dL — ABNORMAL HIGH (ref 0.61–1.24)
GFR calc Af Amer: 51 mL/min — ABNORMAL LOW (ref >60)
GFR calc non Af Amer: 44 mL/min — ABNORMAL LOW (ref >60)
GLUCOSE: 138 mg/dL — AB (ref 65–99)
Potassium: 5.9 mmol/L — ABNORMAL HIGH (ref 3.5–5.1)
Sodium: 139 mmol/L (ref 135–145)

## 2017-12-28 LAB — CBC
HEMATOCRIT: 38.9 % — AB (ref 39.0–52.0)
HEMOGLOBIN: 12.5 g/dL — AB (ref 13.0–17.0)
MCH: 28 pg (ref 26.0–34.0)
MCHC: 32.1 g/dL (ref 30.0–36.0)
MCV: 87.2 fL (ref 78.0–100.0)
Platelets: 169 10*3/uL (ref 150–400)
RBC: 4.46 MIL/uL (ref 4.22–5.81)
RDW: 15.1 % (ref 11.5–15.5)
WBC: 11.2 10*3/uL — ABNORMAL HIGH (ref 4.0–10.5)

## 2017-12-28 LAB — POTASSIUM: Potassium: 5.3 mmol/L — ABNORMAL HIGH (ref 3.5–5.1)

## 2017-12-28 MED ORDER — CLOPIDOGREL BISULFATE 75 MG PO TABS
75.0000 mg | ORAL_TABLET | Freq: Every day | ORAL | Status: DC
  Administered 2017-12-29: 75 mg via ORAL
  Filled 2017-12-28: qty 1

## 2017-12-28 MED ORDER — TRAMADOL HCL 50 MG PO TABS: 50.0000 mg | ORAL_TABLET | Freq: Four times a day (QID) | ORAL | Status: DC | PRN

## 2017-12-28 NOTE — Discharge Instructions (Addendum)
SURGERY: POST OP INSTRUCTIONS °(Surgery for small bowel obstruction, colon resection, etc) ° ° °###################################################################### ° °EAT °Gradually transition to a high fiber diet with a fiber supplement over the next few days after discharge ° °WALK °Walk an hour a day.  Control your pain to do that.   ° °CONTROL PAIN °Control pain so that you can walk, sleep, tolerate sneezing/coughing, go up/down stairs. ° °HAVE A BOWEL MOVEMENT DAILY °Keep your bowels regular to avoid problems.  OK to try a laxative to override constipation.  OK to use an antidairrheal to slow down diarrhea.  Call if not better after 2 tries ° °CALL IF YOU HAVE PROBLEMS/CONCERNS °Call if you are still struggling despite following these instructions. °Call if you have concerns not answered by these instructions ° °###################################################################### ° ° °DIET °Follow a light diet the first few days at home.  Start with a bland diet such as soups, liquids, starchy foods, low fat foods, etc.  If you feel full, bloated, or constipated, stay on a ful liquid or pureed/blenderized diet for a few days until you feel better and no longer constipated. °Be sure to drink plenty of fluids every day to avoid getting dehydrated (feeling dizzy, not urinating, etc.). °Gradually add a fiber supplement to your diet over the next week.  Gradually get back to a regular solid diet.  Avoid fast food or heavy meals the first week as you are more likely to get nauseated. °It is expected for your digestive tract to need a few months to get back to normal.  It is common for your bowel movements and stools to be irregular.  You will have occasional bloating and cramping that should eventually fade away.  Until you are eating solid food normally, off all pain medications, and back to regular activities; your bowels will not be normal. °Focus on eating a low-fat, high fiber diet the rest of your life  (See Getting to Good Bowel Health, below). ° °CARE of your INCISION or WOUND °It is good for closed incision and even open wounds to be washed every day.  Shower every day.  Short baths are fine.  Wash the incisions and wounds clean with soap & water.    °If you have a closed incision(s), wash the incision with soap & water every day.  You may leave closed incisions open to air if it is dry.   You may cover the incision with clean gauze & replace it after your daily shower for comfort. °If you have skin tapes (Steristrips) or skin glue (Dermabond) on your incision, leave them in place.  They will fall off on their own like a scab.  You may trim any edges that curl up with clean scissors.  If you have staples, set up an appointment for them to be removed in the office in 10 days after surgery.  °If you have a drain, wash around the skin exit site with soap & water and place a new dressing of gauze or band aid around the skin every day.  Keep the drain site clean & dry.    °If you have an open wound with packing, see wound care instructions.  In general, it is encouraged that you remove your dressing and packing, shower with soap & water, and replace your dressing once a day.  Pack the wound with clean gauze moistened with normal (0.9%) saline to keep the wound moist & uninfected.  Pressure on the dressing for 30 minutes will stop most wound   bleeding.  Eventually your body will heal & pull the open wound closed over the next few months.  °Raw open wounds will occasionally bleed or secrete yellow drainage until it heals closed.  Drain sites will drain a little until the drain is removed.  Even closed incisions can have mild bleeding or drainage the first few days until the skin edges scab over & seal.   °If you have an open wound with a wound vac, see wound vac care instructions. ° ° ° ° °ACTIVITIES as tolerated °Start light daily activities --- self-care, walking, climbing stairs-- beginning the day after surgery.   Gradually increase activities as tolerated.  Control your pain to be active.  Stop when you are tired.  Ideally, walk several times a day, eventually an hour a day.   °Most people are back to most day-to-day activities in a few weeks.  It takes 4-8 weeks to get back to unrestricted, intense activity. °If you can walk 30 minutes without difficulty, it is safe to try more intense activity such as jogging, treadmill, bicycling, low-impact aerobics, swimming, etc. °Save the most intensive and strenuous activity for last (Usually 4-8 weeks after surgery) such as sit-ups, heavy lifting, contact sports, etc.  Refrain from any intense heavy lifting or straining until you are off narcotics for pain control.  You will have off days, but things should improve week-by-week. °DO NOT PUSH THROUGH PAIN.  Let pain be your guide: If it hurts to do something, don't do it.  Pain is your body warning you to avoid that activity for another week until the pain goes down. °You may drive when you are no longer taking narcotic prescription pain medication, you can comfortably wear a seatbelt, and you can safely make sudden turns/stops to protect yourself without hesitating due to pain. °You may have sexual intercourse when it is comfortable. If it hurts to do something, stop. ° °MEDICATIONS °Take your usually prescribed home medications unless otherwise directed.   °Blood thinners:  °Usually you can restart any strong blood thinners after the second postoperative day.  It is OK to take aspirin right away.    ° If you are on strong blood thinners (warfarin/Coumadin, Plavix, Xerelto, Eliquis, Pradaxa, etc), discuss with your surgeon, medicine PCP, and/or cardiologist for instructions on when to restart the blood thinner & if blood monitoring is needed (PT/INR blood check, etc).   ° ° °PAIN CONTROL °Pain after surgery or related to activity is often due to strain/injury to muscle, tendon, nerves and/or incisions.  This pain is usually  short-term and will improve in a few months.  °To help speed the process of healing and to get back to regular activity more quickly, DO THE FOLLOWING THINGS TOGETHER: °1. Increase activity gradually.  DO NOT PUSH THROUGH PAIN °2. Use Ice and/or Heat °3. Try Gentle Massage and/or Stretching °4. Take over the counter pain medication °5. Take Narcotic prescription pain medication for more severe pain ° °Good pain control = faster recovery.  It is better to take more medicine to be more active than to stay in bed all day to avoid medications. °1.  Increase activity gradually °Avoid heavy lifting at first, then increase to lifting as tolerated over the next 6 weeks. °Do not “push through” the pain.  Listen to your body and avoid positions and maneuvers than reproduce the pain.  Wait a few days before trying something more intense °Walking an hour a day is encouraged to help your body recover faster   and more safely.  Start slowly and stop when getting sore.  If you can walk 30 minutes without stopping or pain, you can try more intense activity (running, jogging, aerobics, cycling, swimming, treadmill, sex, sports, weightlifting, etc.) °Remember: If it hurts to do it, then don’t do it! °2. Use Ice and/or Heat °You will have swelling and bruising around the incisions.  This will take several weeks to resolve. °Ice packs or heating pads (6-8 times a day, 30-60 minutes at a time) will help sooth soreness & bruising. °Some people prefer to use ice alone, heat alone, or alternate between ice & heat.  Experiment and see what works best for you.  Consider trying ice for the first few days to help decrease swelling and bruising; then, switch to heat to help relax sore spots and speed recovery. °Shower every day.  Short baths are fine.  It feels good!  Keep the incisions and wounds clean with soap & water.   °3. Try Gentle Massage and/or Stretching °Massage at the area of pain many times a day °Stop if you feel pain - do not  overdo it °4. Take over the counter pain medication °This helps the muscle and nerve tissues become less irritable and calm down faster °Choose ONE of the following over-the-counter anti-inflammatory medications: °Acetaminophen 500mg tabs (Tylenol) 1-2 pills with every meal and just before bedtime (avoid if you have liver problems or if you have acetaminophen in you narcotic prescription) °Naproxen 220mg tabs (ex. Aleve, Naprosyn) 1-2 pills twice a day (avoid if you have kidney, stomach, IBD, or bleeding problems) °Ibuprofen 200mg tabs (ex. Advil, Motrin) 3-4 pills with every meal and just before bedtime (avoid if you have kidney, stomach, IBD, or bleeding problems) °Take with food/snack several times a day as directed for at least 2 weeks to help keep pain / soreness down & more manageable. °5. Take Narcotic prescription pain medication for more severe pain °A prescription for strong pain control is often given to you upon discharge (for example: oxycodone/Percocet, hydrocodone/Norco/Vicodin, or tramadol/Ultram) °Take your pain medication as prescribed. °Be mindful that most narcotic prescriptions contain Tylenol (acetaminophen) as well - avoid taking too much Tylenol. °If you are having problems/concerns with the prescription medicine (does not control pain, nausea, vomiting, rash, itching, etc.), please call us [phone] to see if we need to switch you to a different pain medicine that will work better for you and/or control your side effects better. °If you need a refill on your pain medication, you must call the office before 4 pm and on weekdays only.  By federal law, prescriptions for narcotics cannot be called into a pharmacy.  They must be filled out on paper & picked up from our office by the patient or authorized caretaker.  Prescriptions cannot be filled after 4 pm nor on weekends.   ° °WHEN TO CALL US [phone] °Severe uncontrolled or worsening pain  °Fever over 101 F (38.5 C) °Concerns with  the incision: Worsening pain, redness, rash/hives, swelling, bleeding, or drainage °Reactions / problems with new medications (itching, rash, hives, nausea, etc.) °Nausea and/or vomiting °Difficulty urinating °Difficulty breathing °Worsening fatigue, dizziness, lightheadedness, blurred vision °Other concerns °If you are not getting better after two weeks or are noticing you are getting worse, contact our office [phone] for further advice.  We may need to adjust your medications, re-evaluate you in the office, send you to the emergency room, or see what other things we can do to help. °The   clinic staff is available to answer your questions during regular business hours (8:30am-5pm).  Please don’t hesitate to call and ask to speak to one of our nurses for clinical concerns.    °A surgeon from [facility] is always on call at the hospitals 24 hours/day °If you have a medical emergency, go to the nearest emergency room or call 911. ° °FOLLOW UP in our office °One the day of your discharge from the hospital (or the next business weekday), please call [facility] to set up or confirm an appointment to see your surgeon in the office for a follow-up appointment.  Usually it is 2-3 weeks after your surgery.   °If you have skin staples at your incision(s), let the office know so we can set up a time in the office for the nurse to remove them (usually around 10 days after surgery). °Make sure that you call for appointments the day of discharge (or the next business weekday) from the hospital to ensure a convenient appointment time. °IF YOU HAVE DISABILITY OR FAMILY LEAVE FORMS, BRING THEM TO THE OFFICE FOR PROCESSING.  DO NOT GIVE THEM TO YOUR DOCTOR. ° °[facility] °[address], [address], [place],   [address] ? °[phone] - Main °[phone] - Toll Free,  [phone] - Fax °[email or web address] ° °GETTING TO GOOD BOWEL HEALTH. °It is  expected for your digestive tract to need a few months to get back to normal.  It is common for your bowel movements and stools to be irregular.  You will have occasional bloating and cramping that should eventually fade away.  Until you are eating solid food normally, off all pain medications, and back to regular activities; your bowels will not be normal.   °Avoiding constipation °The goal: ONE SOFT BOWEL MOVEMENT A DAY!    °Drink plenty of fluids.  Choose water first. °TAKE A FIBER SUPPLEMENT EVERY DAY THE REST OF YOUR LIFE °During your first week back home, gradually add back a fiber supplement every day °Experiment which form you can tolerate.   There are many forms such as powders, tablets, wafers, gummies, etc °Psyllium bran (Metamucil), methylcellulose (Citrucel), Miralax or Glycolax, Benefiber, Flax Seed.  °Adjust the dose week-by-week (1/2 dose/day to 6 doses a day) until you are moving your bowels 1-2 times a day.  Cut back the dose or try a different fiber product if it is giving you problems such as diarrhea or bloating. °Sometimes a laxative is needed to help jump-start bowels if constipated until the fiber supplement can help regulate your bowels.  If you are tolerating eating & you are farting, it is okay to try a gentle laxative such as double dose MiraLax, prune juice, or Milk of Magnesia.  Avoid using laxatives too often. °Stool softeners can sometimes help counteract the constipating effects of narcotic pain medicines.  It can also cause diarrhea, so avoid using for too long. °If you are still constipated despite taking fiber daily, eating solids, and a few doses of laxatives, call our office. °Controlling diarrhea °Try drinking liquids and eating bland foods for a few days to avoid stressing your intestines further. °Avoid dairy products (especially milk & ice cream) for a short time.  The intestines often can lose the ability to digest lactose when stressed. °Avoid foods that cause gassiness or  bloating.  Typical foods include beans and other legumes, cabbage, broccoli, and dairy foods.  Avoid greasy, spicy, fast foods.  Every person has   some sensitivity to other foods, so listen to your body and avoid those foods that trigger problems for you. Probiotics (such as active yogurt, Align, etc) may help repopulate the intestines and colon with normal bacteria and calm down a sensitive digestive tract Adding a fiber supplement gradually can help thicken stools by absorbing excess fluid and retrain the intestines to act more normally.  Slowly increase the dose over a few weeks.  Too much fiber too soon can backfire and cause cramping & bloating. It is okay to try and slow down diarrhea with a few doses of antidiarrheal medicines.   Bismuth subsalicylate (ex. Kayopectate, Pepto Bismol) for a few doses can help control diarrhea.  Avoid if pregnant.   Loperamide (Imodium) can slow down diarrhea.  Start with one tablet (2mg ) first.  Avoid if you are having fevers or severe pain.  ILEOSTOMY PATIENTS WILL HAVE CHRONIC DIARRHEA since their colon is not in use.    Drink plenty of liquids.  You will need to drink even more glasses of water/liquid a day to avoid getting dehydrated. Record output from your ileostomy.  Expect to empty the bag every 3-4 hours at first.  Most people with a permanent ileostomy empty their bag 4-6 times at the least.   Use antidiarrheal medicine (especially Imodium) several times a day to avoid getting dehydrated.  Start with a dose at bedtime & breakfast.  Adjust up or down as needed.  Increase antidiarrheal medications as directed to avoid emptying the bag more than 8 times a day (every 3 hours). Work with your wound ostomy nurse to learn care for your ostomy.  See ostomy care instructions. TROUBLESHOOTING IRREGULAR BOWELS 1) Start with a soft & bland diet. No spicy, greasy, or fried foods.  2) Avoid gluten/wheat or dairy products from diet to see if symptoms improve. 3) Miralax  17gm or flax seed mixed in [place]. water or juice-daily. May use 2-4 times a day as needed. 4) Gas-X, Phazyme, etc. as needed for gas & bloating.  5) Prilosec (omeprazole) over-the-counter as needed 6)  Consider probiotics (Align, Activa, etc) to help calm the bowels down  Call your doctor if you are getting worse or not getting better.  Sometimes further testing (cultures, endoscopy, X-ray studies, CT scans, bloodwork, etc.) may be needed to help diagnose and treat the cause of the diarrhea. [facility], [place], [place], [place], [place]  [id number] [phone] - Main.    [phone]  - Toll Free.   [phone] - Fax [email or web address]   Colorectal Cancer Colorectal cancer is an abnormal growth of cells and tissue (tumor) in the colon or rectum, which are parts of the large intestine. The cancer can spread (metastasize) to other parts of the body. What are the causes? Most cases of colorectal cancer start as abnormal growths called polyps on the inner wall of the colon or rectum. Other times, abnormal changes to genes (genetic mutations) can cause cells to form cancer. What increases the risk? You are more likely to develop this condition if:  You are older than age 27.  You have multiple polyps in the colon or rectum.  You have diabetes.  You are African American.  You have a family history of Lynch syndrome.  You have had cancer before.  You have certain hereditary conditions, such as: ? Familial adenomatous polyposis. ? Turcot syndrome. ? Peutz-Jeghers syndrome.  You eat a diet that is high in fat (especially animal fat) and low  in fiber, fruits, and vegetables.  You have an inactive (sedentary) lifestyle.  You have an inflammatory bowel disease or Crohn disease.  You smoke.  You drink alcohol excessively.  What are the signs or symptoms? Early colorectal cancer often does not cause symptoms. As the cancer grows,  symptoms may include:  Changes in bowel habits.  Feeling like the bowel does not empty completely after a bowel movement.  Stools that are narrower than usual.  Blood in the stool.  Diarrhea.  Constipation.  Anemia.  Discomfort, pain, bloating, fullness, or cramps in the abdomen.  Frequent gas pain.  Unexplained weight loss.  Constant fatigue.  Nausea and vomiting.  How is this diagnosed? This condition may be diagnosed with:  A medical history.  A physical exam.  Tests. These may include: ? A digital rectal exam. ? A stool test called a fecal occult blood test. ? Blood tests. ? A test in which a tissue sample is taken from the colon or rectum and examined under a microscope (biopsy). ? Imaging tests, such as:  X-rays.  A barium enema.  CT scans.  MRIs.  A sigmoidoscopy. This test is done to view the inside of the rectum.  A colonoscopy. This test is done to view the inside of the colon. During this test, small polyps can be removed or biopsies may be taken.  An endorectal ultrasound. This test checks how deep a tumor in the rectum has grown and whether the cancer has spread to lymph nodes or other nearby tissues.  Your health care provider may order additional tests to find out whether the cancer has spread to other parts of the body (what stage it is). The stages of cancer include:  Stage 0. At this stage, the cancer is found only in the innermost lining of the colon or rectum.  Stage I. At this stage, the cancer has grown into the inner wall of the colon or rectum.  Stage II. At this stage, the cancer has gone more deeply into the wall of the colon or rectum or through the wall. It may have invaded nearby tissue.  Stage III. At this stage, the cancer has spread to nearby lymph nodes.  Stage IV. At this stage, the cancer has spread to other parts of the body, such as the liver or lungs.  How is this treated? Treatment for this condition depends on  the type and stage of the cancer. Treatment may include:  Surgery. In the early stages of the cancer, surgery may be done to remove polyps or small tumors from the colon. In later stages, surgery may be done to remove part of the colon.  Chemotherapy. This treatment uses medicines to kill cancer cells.  Targeted therapy. This treatment targets specific gene mutations or proteins that the cancer expresses in order to kill tumor cells.  Radiation therapy. This treatment uses radiation to kill cancer cells or shrink tumors.  Radiofrequency ablation. This treatment uses radio waves to destroy the tumors that may have spread to other areas of the body, such as the liver.  Follow these instructions at home:  Take over-the-counter and prescription medicines only as told by your health care provider.  Try to eat regular, healthy meals. Some of your treatments might affect your appetite. If you are having problems eating or with your appetite, ask to meet with a food and nutrition specialist (dietitian).  Consider joining a support group. This may help you learn about your diagnosis and cope with  the stress of having colorectal cancer.  If you are admitted to the hospital, inform your cancer care team.  Keep all follow-up visits as told by your health care provider. This is important. How is this prevented?  Colorectal cancer can be prevented with screening tests that find polyps so they can be removed before they develop into cancer.  Most people with average risk of colorectal cancer should start screening at age 85. People at increased risk should start screening at an earlier age. Where to find more information:  American Cancer Society: https://www.cancer.[place] https://www.cancer.gov Contact a health care provider if:  Your diarrhea or constipation does not go away.  You have blood in your stool.  Your bowel habits change.  You have increased pain  in your abdomen.  You notice new fatigue or weakness.  You lose weight. Get help right away if:  You have increased bleeding from your rectum.  You have any uncontrollable or severe abdomen (abdominal) symptoms. Summary  Colorectal cancer is an abnormal growth of cells and tissue (tumor) in the colon or rectum.  Common risk factors for this condition include having a relative with colon cancer, being older in age, having an inflammatory bowel disease, and being African American.  This condition may be diagnosed with tests, such as a colonoscopy and biopsy.  Treatment depends on the type and stage of the cancer. Commonly, treatment includes surgery to remove the tumor along with chemotherapy or targeted therapy.  Keep all follow-up visits as told by your health care provider. This is important. This information is not intended to replace advice given to you by your health care provider. Make sure you discuss any questions you have with your health care provider. Document Released: 06/26/2005 Document Revised: 07/28/2016 Document Reviewed: 07/28/2016 Elsevier Interactive Patient Education  [employer].

## 2017-12-28 NOTE — Progress Notes (Signed)
2 Days Post-Op lap sigmoidectomy Subjective: Min pain.  No nausea.  Tolerating liquids.  Having BM's  Objective: Vital signs in last 24 hours: Temp:  [97.7 F (36.5 C)-98.1 F (36.7 C)] 97.7 F (36.5 C) (06/21 0618) Pulse Rate:  [52-77] 52 (06/21 0618) Resp:  [14-16] 16 (06/21 0618) BP: (134-146)/(57-93) 134/57 (06/21 0618) SpO2:  [88 %-97 %] 95 % (06/21 0618) Weight:  [79.5 kg (175 lb 3.2 oz)-83.6 kg (184 lb 3.2 oz)] 83.6 kg (184 lb 3.2 oz) (06/21 0144)   Intake/Output from previous day: 06/20 0701 - 06/21 0700 In: 2835.4 [P.O.:480; I.V.:1955.4] Out: 1405 [Urine:1405] Intake/Output this shift: No intake/output data recorded.   General appearance: alert and cooperative GI: normal findings: soft, less distended  Incision: no significant drainage  Lab Results:  Recent Labs    12/27/17 0432 12/28/17 0426  WBC 10.0 11.2*  HGB 12.8* 12.5*  HCT 40.1 38.9*  PLT 163 169   BMET Recent Labs    12/27/17 0432 12/28/17 0426  NA 141 139  K 5.0 5.9*  CL 105 106  CO2 27 30  GLUCOSE 210* 138*  BUN 20 26*  CREATININE 1.45* 1.40*  CALCIUM 8.6* 8.5*   PT/INR Recent Labs    12/26/17 1100  LABPROT 13.9  INR 1.07   ABG No results for input(s): PHART, HCO3 in the last 72 hours.  Invalid input(s): PCO2, PO2  MEDS, Scheduled . acetaminophen  1,000 mg Oral Q6H  . enoxaparin (LOVENOX) injection  40 mg Subcutaneous Q24H  . feeding supplement  237 mL Oral BID BM  . metoprolol succinate  75 mg Oral Daily  . saccharomyces boulardii  250 mg Oral BID  . warfarin  5 mg Oral Once per day on Mon Tue Wed Thu Fri Sat   And  . [START ON 12/30/2017] warfarin  2.5 mg Oral Q Sun-1800  . Warfarin - Physician Dosing Inpatient   Does not apply q1800    Studies/Results: No results found.  Assessment: s/p Procedure(s): LAPAROSCOPIC PARTIAL COLECTOMY ERAS PATHWAY PROCTOSCOPY Patient Active Problem List   Diagnosis Date Noted  . Colon cancer ([place]) 12/26/2017  . Colonic mass   .  Long term (current) use of anticoagulants [Z79.01] 08/15/2017  . Ischemic cardiomyopathy 08/13/2017  . Hypertension 08/13/2017  . Non-ST elevation (NSTEMI) myocardial infarction ([place])   . Atrial fibrillation ([place]) 08/08/2017  . Hyperlipidemia 11/09/2009  . LBBB 11/09/2009  . HYPERTENSION, BENIGN 11/27/2008  . Coronary atherosclerosis 10/05/2008  . DIZZINESS 10/05/2008  . CHEST PAIN-UNSPECIFIED 10/05/2008    Expected post op course   Plan: Advance diet to soft foods SL MIV Ambulate D/c foley    LOS: 2 days     .Rosario Adie, [facility], [place] [phone]   12/28/2017 8:35 AM

## 2017-12-29 ENCOUNTER — Encounter (HOSPITAL_COMMUNITY): Payer: Self-pay | Admitting: Surgery

## 2017-12-29 DIAGNOSIS — E538 Deficiency of other specified B group vitamins: Secondary | ICD-10-CM

## 2017-12-29 LAB — CBC
HCT: 35.6 % — ABNORMAL LOW (ref 39.0–52.0)
HEMOGLOBIN: 11.5 g/dL — AB (ref 13.0–17.0)
MCH: 28 pg (ref 26.0–34.0)
MCHC: 32.3 g/dL (ref 30.0–36.0)
MCV: 86.6 fL (ref 78.0–100.0)
Platelets: 168 10*3/uL (ref 150–400)
RBC: 4.11 MIL/uL — ABNORMAL LOW (ref 4.22–5.81)
RDW: 15.3 % (ref 11.5–15.5)
WBC: 8.6 10*3/uL (ref 4.0–10.5)

## 2017-12-29 LAB — BASIC METABOLIC PANEL
Anion gap: 6 (ref 5–15)
BUN: 30 mg/dL — AB (ref 6–20)
CALCIUM: 8.3 mg/dL — AB (ref 8.9–10.3)
CO2: 27 mmol/L (ref 22–32)
Chloride: 106 mmol/L (ref 101–111)
Creatinine, Ser: 1.19 mg/dL (ref 0.61–1.24)
GFR calc Af Amer: >60 mL/min (ref >60)
GFR calc non Af Amer: 53 mL/min — ABNORMAL LOW (ref >60)
Glucose, Bld: 100 mg/dL — ABNORMAL HIGH (ref 65–99)
Potassium: 4.3 mmol/L (ref 3.5–5.1)
Sodium: 139 mmol/L (ref 135–145)

## 2017-12-29 LAB — PROTIME-INR
INR: 1.37
PROTHROMBIN TIME: 16.7 s — AB (ref 11.4–15.2)

## 2017-12-29 MED ORDER — VITAMIN B-12 1000 MCG PO TABS
1000.0000 ug | ORAL_TABLET | Freq: Every day | ORAL | Status: DC
  Administered 2017-12-29: 1000 ug via ORAL
  Filled 2017-12-29: qty 1

## 2017-12-29 MED ORDER — ATORVASTATIN CALCIUM 40 MG PO TABS: 40.0000 mg | ORAL_TABLET | Freq: Every day | ORAL | Status: DC

## 2017-12-29 MED ORDER — TRAMADOL HCL 50 MG PO TABS: 50.0000 mg | ORAL_TABLET | Freq: Four times a day (QID) | ORAL | 0 refills | Status: DC | PRN

## 2017-12-29 NOTE — Discharge Summary (Signed)
Physician Discharge Summary    Patient ID: [name] MRN: [id number] DOB/AGE: 11/09/1929  84 y.o.  Admit date: 12/26/2017 Discharge date: 12/29/2017  Hospital Stay = 3 days  Patient Care Team: Marton Redwood, MD as PCP - General (Internal Medicine) Burnell Blanks, MD as PCP - Cardiology (Cardiology) Leighton Ruff, MD as Consulting Physician (Colon and Rectal Surgery)  Discharge Diagnoses:  Principal Problem:   Cancer of sigmoid colon s/p sigmoid colectomy 12/26/2017 Active Problems:   Hyperlipidemia   Atrial fibrillation ([place])   Ischemic cardiomyopathy   Hypertension   Long term (current) use of anticoagulants [Z79.01]   Vitamin B12 deficiency   3 Days Post-Op  12/26/2017  POST-OPERATIVE DIAGNOSIS:   colon cancer  SURGERY:  12/26/2017  Procedure(s): LAPAROSCOPIC SIGMOID COLECTOMY  PROCTOSCOPY  SURGEON:    Surgeon(s): Leighton Ruff, MD Coralie Keens, MD  Consults: None  Hospital Course:   The patient underwent the surgery above.  Postoperatively, the patient gradually mobilized and advanced to a solid diet.  Pain and other symptoms were treated aggressively.    By the time of discharge, the patient was walking well the hallways, eating food, having flatus.  Pain was well-controlled on an oral medications.  Based on meeting discharge criteria and continuing to recover, I felt it was safe for the patient to be discharged from the hospital to further recover with close followup. Postoperative recommendations were discussed in detail.  They are written as well.  Discharged Condition: good  Disposition:  Follow-up Information    Leighton Ruff, MD. Schedule an appointment as soon as possible for a visit in 2 week(s).   Specialty:  General Surgery Why:  Follow up with your surgeon in the office Contact information: [place] [address] [phone]        Burnell Blanks, MD. Call in 3 day(s).   Specialty:   Cardiology Why:  Call carsdiology office to set up PT/INR check at coumadin clinic next week Contact information: [place]. [address] [place] [address] [phone]           Discharge disposition: 01-Home or Self Care       Discharge Instructions    Call MD for:   Complete by:  As directed    FEVER > 101.5 F  (temperatures < 101.5 F are not significant)   Call MD for:  extreme fatigue   Complete by:  As directed    Call MD for:  persistant dizziness or light-headedness   Complete by:  As directed    Call MD for:  persistant nausea and vomiting   Complete by:  As directed    Call MD for:  redness, tenderness, or signs of infection (pain, swelling, redness, odor or green/yellow discharge around incision site)   Complete by:  As directed    Call MD for:  severe uncontrolled pain   Complete by:  As directed    Diet - low sodium heart healthy   Complete by:  As directed    Start with a bland diet such as soups, liquids, starchy foods, low fat foods, etc. the first few days at home. Gradually advance to a solid, low-fat, high fiber diet by the end of the first week at home.   Add a fiber supplement to your diet (Metamucil, etc) If you feel full, bloated, or constipated, stay on a full liquid or pureed/blenderized diet for a few days until you feel better and are no longer  constipated.   Discharge instructions   Complete by:  As directed    See Discharge Instructions If you are not getting better after two weeks or are noticing you are getting worse, contact our office [phone] for further advice.  We may need to adjust your medications, re-evaluate you in the office, send you to the emergency room, or see what other things we can do to help. The clinic staff is available to answer your questions during regular business hours (8:30am-5pm).  Please don't hesitate to call and ask to speak to one of our nurses for clinical concerns.    A surgeon from [facility] is always on call at the hospitals 24 hours/day If you have a medical emergency, go to the nearest emergency room or call 911.   Discharge wound care:   Complete by:  As directed    It is good for closed incisions and even open wounds to be washed every day.  Shower every day.  Short baths are fine.  Wash the incisions and wounds clean with soap & water.     You may leave closed incisions open to air if it is dry.   You may cover the incision with clean gauze & replace it after your daily shower for comfort.   Driving Restrictions   Complete by:  As directed    You may drive when: - you are no longer taking narcotic prescription pain medication - you can comfortably wear a seatbelt - you can safely make sudden turns/stops without pain.   Increase activity slowly   Complete by:  As directed    Start light daily activities --- self-care, walking, climbing stairs- beginning the day after surgery.  Gradually increase activities as tolerated.  Control your pain to be active.  Stop when you are tired.  Ideally, walk several times a day, eventually an hour a day.   Most people are back to most day-to-day activities in a few weeks.  It takes 4-6 weeks to get back to unrestricted, intense activity. If you can walk 30 minutes without difficulty, it is safe to try more intense activity such as jogging, treadmill, bicycling, low-impact aerobics, swimming, etc. Save the most intensive and strenuous activity for last (Usually 4-8 weeks after surgery) such as sit-ups, heavy lifting, contact sports, etc.  Refrain from any intense heavy lifting or straining until you are off narcotics for pain control.  You will have off days, but things should improve week-by-week. DO NOT PUSH THROUGH PAIN.  Let pain be your guide: If it hurts to do something, don't do it.   Lifting restrictions   Complete by:  As directed    If you can walk 30 minutes without difficulty, it is safe to try more intense  activity such as jogging, treadmill, bicycling, low-impact aerobics, swimming, etc. Save the most intensive and strenuous activity for last (Usually 4-8 weeks after surgery) such as sit-ups, heavy lifting, contact sports, etc.   Refrain from any intense heavy lifting or straining until you are off narcotics for pain control.  You will have off days, but things should improve week-by-week. DO NOT PUSH THROUGH PAIN.  Let pain be your guide: If it hurts to do something, don't do it.  Pain is your body warning you to avoid that activity for another week until the pain goes down.   May shower / Bathe   Complete by:  As directed    May walk up steps   Complete  by:  As directed    Sexual Activity Restrictions   Complete by:  As directed    You may have sexual intercourse when it is comfortable. If it hurts to do something, stop.      Allergies as of 12/29/2017      Reactions   Gadolinium Derivatives Hives, Itching, Other (See Comments)   Dr. Jeralyn Ruths s/w Mr. Polanco. We observed him for 15 minutes. He did not need to take Benadryl. The symptoms began to subside before he left.       Medication List    TAKE these medications   atorvastatin 40 MG tablet Commonly known as:  LIPITOR TAKE ONE TABLET DAILY AT 6PM   clopidogrel 75 MG tablet Commonly known as:  PLAVIX TAKE ONE TABLET EACH DAY What changed:    how much to take  how to take this  when to take this  additional instructions   metoprolol succinate 25 MG 24 hr tablet Commonly known as:  TOPROL-XL TAKE 3 TABLETS DAILY What changed:    how much to take  how to take this  when to take this   traMADol 50 MG tablet Commonly known as:  ULTRAM Take 1-2 tablets (50-100 mg total) by mouth every 6 (six) hours as needed for moderate pain or severe pain.   vitamin B-12 1000 MCG tablet Commonly known as:  CYANOCOBALAMIN Take 1,000 mcg by mouth daily.   warfarin 5 MG tablet Commonly known as:  COUMADIN Take as directed. If you  are unsure how to take this medication, talk to your nurse or doctor. Original instructions:  TAKE AS DIRECTED BY COUMADIN CLINIC What changed:  See the new instructions.            Discharge Care Instructions  (From admission, onward)        Start     Ordered   12/29/17 0000  Discharge wound care:    Comments:  It is good for closed incisions and even open wounds to be washed every day.  Shower every day.  Short baths are fine.  Wash the incisions and wounds clean with soap & water.     You may leave closed incisions open to air if it is dry.   You may cover the incision with clean gauze & replace it after your daily shower for comfort.   12/29/17 0752      Significant Diagnostic Studies:  Results for orders placed or performed during the hospital encounter of 12/26/17 (from the past 72 hour(s))  Protime-INR     Status: None   Collection Time: 12/26/17 11:00 AM  Result Value Ref Range   Prothrombin Time 13.9 11.4 - 15.2 seconds   INR 1.07     Comment: Performed at [facility], [place] [address] [place], [place] [address]  Type and screen [place]     Status: None   Collection Time: 12/26/17 11:05 AM  Result Value Ref Range   ABO/RH(D) A POS    Antibody Screen NEG    Sample Expiration      12/29/2017 Performed at [facility], [place] [address], [place], [place] [address]   Basic metabolic panel     Status: Abnormal   Collection Time: 12/27/17  4:32 AM  Result Value Ref Range   Sodium 141 135 - 145 mmol/L   Potassium 5.0 3.5 - 5.1 mmol/L   Chloride 105 101 - 111 mmol/L   CO2 27 22 - 32  mmol/L   Glucose, Bld 210 (H) 65 - 99 mg/dL   BUN 20 6 - 20 mg/dL   Creatinine, Ser 1.45 (H) 0.61 - 1.24 mg/dL   Calcium 8.6 (L) 8.9 - 10.3 mg/dL   GFR calc non Af Amer 42 (L) >60 mL/min   GFR calc Af Amer 49 (L) >60 mL/min    Comment: (NOTE) The eGFR has been calculated using the CKD EPI equation. This calculation has not  been validated in all clinical situations. eGFR's persistently <60 mL/min signify possible Chronic Kidney Disease.    Anion gap 9 5 - 15    Comment: Performed at [facility], [place] [address] [place], [place] [address]  CBC     Status: Abnormal   Collection Time: 12/27/17  4:32 AM  Result Value Ref Range   WBC 10.0 4.0 - 10.5 K/uL   RBC 4.62 4.22 - 5.81 MIL/uL   Hemoglobin 12.8 (L) 13.0 - 17.0 g/dL   HCT 40.1 39.0 - 52.0 %   MCV 86.8 78.0 - 100.0 fL   MCH 27.7 26.0 - 34.0 pg   MCHC 31.9 30.0 - 36.0 g/dL   RDW 14.8 11.5 - 15.5 %   Platelets 163 150 - 400 K/uL    Comment: Performed at [facility], [place] [address] [place], [place] [address]  Basic metabolic panel     Status: Abnormal   Collection Time: 12/28/17  4:26 AM  Result Value Ref Range   Sodium 139 135 - 145 mmol/L   Potassium 5.9 (H) 3.5 - 5.1 mmol/L   Chloride 106 101 - 111 mmol/L   CO2 30 22 - 32 mmol/L   Glucose, Bld 138 (H) 65 - 99 mg/dL   BUN 26 (H) 6 - 20 mg/dL   Creatinine, Ser 1.40 (H) 0.61 - 1.24 mg/dL   Calcium 8.5 (L) 8.9 - 10.3 mg/dL   GFR calc non Af Amer 44 (L) >60 mL/min   GFR calc Af Amer 51 (L) >60 mL/min    Comment: (NOTE) The eGFR has been calculated using the CKD EPI equation. This calculation has not been validated in all clinical situations. eGFR's persistently <60 mL/min signify possible Chronic Kidney Disease.    Anion gap 3 (L) 5 - 15    Comment: Performed at [facility], [place] [address], [place], [place] [address]  CBC     Status: Abnormal   Collection Time: 12/28/17  4:26 AM  Result Value Ref Range   WBC 11.2 (H) 4.0 - 10.5 K/uL   RBC 4.46 4.22 - 5.81 MIL/uL   Hemoglobin 12.5 (L) 13.0 - 17.0 g/dL   HCT 38.9 (L) 39.0 - 52.0 %   MCV 87.2 78.0 - 100.0 fL   MCH 28.0 26.0 - 34.0 pg   MCHC 32.1 30.0 - 36.0 g/dL   RDW 15.1 11.5 - 15.5 %   Platelets 169 150 - 400 K/uL    Comment: Performed at [facility], [place]  [address] [place], [place] [address]  Potassium     Status: Abnormal   Collection Time: 12/28/17  3:59 PM  Result Value Ref Range   Potassium 5.3 (H) 3.5 - 5.1 mmol/L    Comment: Performed at [facility], [place] [address], [place], [place] [address]  Basic metabolic panel     Status: Abnormal   Collection Time: 12/29/17  5:07 AM  Result Value Ref Range   Sodium 139 135 - 145 mmol/L  Potassium 4.3 3.5 - 5.1 mmol/L    Comment: DELTA CHECK NOTED   Chloride 106 101 - 111 mmol/L   CO2 27 22 - 32 mmol/L   Glucose, Bld 100 (H) 65 - 99 mg/dL   BUN 30 (H) 6 - 20 mg/dL   Creatinine, Ser 1.19 0.61 - 1.24 mg/dL   Calcium 8.3 (L) 8.9 - 10.3 mg/dL   GFR calc non Af Amer 53 (L) >60 mL/min   GFR calc Af Amer >60 >60 mL/min    Comment: (NOTE) The eGFR has been calculated using the CKD EPI equation. This calculation has not been validated in all clinical situations. eGFR's persistently <60 mL/min signify possible Chronic Kidney Disease.    Anion gap 6 5 - 15    Comment: Performed at [facility], [place] [address], [place], [place] [address]  CBC     Status: Abnormal   Collection Time: 12/29/17  5:07 AM  Result Value Ref Range   WBC 8.6 4.0 - 10.5 K/uL   RBC 4.11 (L) 4.22 - 5.81 MIL/uL   Hemoglobin 11.5 (L) 13.0 - 17.0 g/dL   HCT 35.6 (L) 39.0 - 52.0 %   MCV 86.6 78.0 - 100.0 fL   MCH 28.0 26.0 - 34.0 pg   MCHC 32.3 30.0 - 36.0 g/dL   RDW 15.3 11.5 - 15.5 %   Platelets 168 150 - 400 K/uL    Comment: Performed at [facility], [place] [address] [place], [place] [address]  Protime-INR     Status: Abnormal   Collection Time: 12/29/17  5:07 AM  Result Value Ref Range   Prothrombin Time 16.7 (H) 11.4 - 15.2 seconds   INR 1.37     Comment: Performed at [facility], [place] [address], [place], [place] [address]    No results found.  Discharge Exam: Blood pressure (!) 122/53, pulse 61, temperature 98.2 F (36.8 C),  temperature source Oral, resp. rate 18, height _0  (1.803 m), weight 83.6 kg (184 lb 3.2 oz), SpO2 92 %.  General: Pt awake/alert/oriented x4 in No acute distress Eyes: PERRL, normal EOM.  Sclera clear.  No icterus Neuro: CN II-XII intact w/o focal sensory/motor deficits. Lymph: No head/neck/groin lymphadenopathy Psych:  No delerium/psychosis/paranoia HENT: Normocephalic, Mucus membranes moist.  No thrush Neck: Supple, No tracheal deviation Chest: No chest wall pain w good excursion CV:  Pulses intact.  Regular rhythm MS: Normal AROM mjr joints.  No obvious deformity Abdomen: Soft.  Nondistended.  Nontender.  No evidence of peritonitis.  No incarcerated hernias. Ext:  SCDs BLE.  No mjr edema.  No cyanosis Skin: No petechiae / purpura  Past Medical History:  Diagnosis Date  . A-fib ([place])   . Arthritis   . BPH (benign prostatic hyperplasia)   . Chest pain, unspecified   . Chronic kidney disease   . Coronary atherosclerosis of unspecified type of vessel, native or graft   . Dizziness and giddiness   . Hyperlipidemia   . Hypertension   . Myocardial infarction ([place])    x2  . Non-ST elevation (NSTEMI) myocardial infarction ([place])   . Peripheral neuropathy   . PVD (peripheral vascular disease) ([place])   . Shortness of breath    exertion  . Vitamin B12 deficiency     Past Surgical History:  Procedure Laterality Date  . COLON SURGERY     partial colectomy Dr. Marcello Moores 12-14-17  . CORONARY ANGIOPLASTY     4 stents  .  CORONARY STENT INTERVENTION N/A 08/09/2017   Procedure: CORONARY STENT INTERVENTION;  Surgeon: Nelva Bush, MD;  Location: [place] [facility];  Service: Cardiovascular;  Laterality: N/A;  . FLEXIBLE SIGMOIDOSCOPY N/A 11/12/2017   Procedure: FLEXIBLE SIGMOIDOSCOPY;  Surgeon: Irene Shipper, MD;  Location: [facility];  Service: Endoscopy;  Laterality: N/A;  . HERNIA REPAIR     right inguinal  . INGUINAL HERNIA REPAIR  06/14/2012   Procedure: HERNIA REPAIR INGUINAL  ADULT;  Surgeon: Odis Hollingshead, MD;  Location: [place];  Service: General;  Laterality: Right;  . INSERTION OF MESH  06/14/2012   Procedure: INSERTION OF MESH;  Surgeon: Odis Hollingshead, MD;  Location: [place] [facility];  Service: General;  Laterality: Right;  . LAPAROSCOPIC PARTIAL COLECTOMY N/A 12/26/2017   Procedure: LAPAROSCOPIC PARTIAL COLECTOMY ERAS PATHWAY;  Surgeon: Leighton Ruff, MD;  Location: [facility];  Service: General;  Laterality: N/A;  . LEFT HEART CATH AND CORONARY ANGIOGRAPHY N/A 08/09/2017   Procedure: LEFT HEART CATH AND CORONARY ANGIOGRAPHY;  Surgeon: Nelva Bush, MD;  Location: [place] [facility];  Service: Cardiovascular;  Laterality: N/A;  . PROCTOSCOPY N/A 12/26/2017   Procedure: PROCTOSCOPY;  Surgeon: Leighton Ruff, MD;  Location: [facility];  Service: General;  Laterality: N/A;  . [place] History   Socioeconomic History  . Marital status: Divorced    Spouse name: Not on file  . Number of children: 3  . Years of education: Not on file  . Highest education level: Not on file  Occupational History  . Occupation: Magazine features editor    Comment: [place]  Social Needs  . Financial resource strain: Not on file  . Food insecurity:    Worry: Not on file    Inability: Not on file  . Transportation needs:    Medical: Not on file    Non-medical: Not on file  Tobacco Use  . Smoking status: Former Smoker    Last attempt to quit: 07/10/1981    Years since quitting: 36.4  . Smokeless tobacco: Never Used  Substance and Sexual Activity  . Alcohol use: No  . Drug use: No  . Sexual activity: Not on file  Lifestyle  . Physical activity:    Days per week: Not on file    Minutes per session: Not on file  . Stress: Not on file  Relationships  . Social connections:    Talks on phone: Not on file    Gets together: Not on file    Attends religious service: Not on file    Active member of club or organization: Not on file     Attends meetings of clubs or organizations: Not on file    Relationship status: Not on file  . Intimate partner violence:    Fear of current or ex partner: Not on file    Emotionally abused: Not on file    Physically abused: Not on file    Forced sexual activity: Not on file  Other Topics Concern  . Not on file  Social History Narrative   Regular exercise          Family History  Problem Relation Age of Onset  . Heart attack Father 34  . Arthritis Sister   . Hypothyroidism Daughter     Current Facility-Administered Medications  Medication Dose Route Frequency Provider Last Rate Last Dose  . acetaminophen (TYLENOL) tablet 1,000 mg  1,000 mg Oral Z3G Leighton Ruff, MD  1,000 mg at 12/29/17 6659  . alum & mag hydroxide-simeth (MAALOX/MYLANTA) 200-200-20 MG/5ML suspension 30 mL  30 mL Oral D3T PRN Leighton Ruff, MD   30 mL at 12/28/17 0257  . atorvastatin (LIPITOR) tablet 40 mg  40 mg Oral q1800 Michael Boston, MD      . clopidogrel (PLAVIX) tablet 75 mg  75 mg Oral Daily Leighton Ruff, MD      . diphenhydrAMINE (BENADRYL) 12.5 MG/5ML elixir 12.5 mg  12.5 mg Oral T0V PRN Leighton Ruff, MD       Or  . diphenhydrAMINE (BENADRYL) injection 12.5 mg  12.5 mg Intravenous X7L PRN Leighton Ruff, MD      . enoxaparin (LOVENOX) injection 40 mg  40 mg Subcutaneous T90Z Leighton Ruff, MD   40 mg at 12/28/17 0742  . feeding supplement (ENSURE SURGERY) liquid 237 mL  237 mL Oral BID BM Leighton Ruff, MD   009 mL at 12/28/17 1321  . HYDROmorphone (DILAUDID) injection 0.5 mg  0.5 mg Intravenous Q3R PRN Leighton Ruff, MD      . metoprolol succinate (TOPROL-XL) 24 hr tablet 75 mg  75 mg Oral Daily Leighton Ruff, MD   75 mg at 12/28/17 0901  . ondansetron (ZOFRAN) tablet 4 mg  4 mg Oral A0T PRN Leighton Ruff, MD       Or  . ondansetron [facility]) injection 4 mg  4 mg Intravenous M2U PRN Leighton Ruff, MD      . saccharomyces boulardii [facility]) capsule 250 mg  250 mg Oral BID Leighton Ruff, MD   633 mg at 12/28/17 2131  . traMADol (ULTRAM) tablet 50-100 mg  50-100 mg Oral H5K PRN Leighton Ruff, MD      . vitamin B-12 (CYANOCOBALAMIN) tablet 1,000 mcg  1,000 mcg Oral Daily Michael Boston, MD      . warfarin (COUMADIN) tablet 5 mg  5 mg Oral Once per day on Mon Tue Wed Thu Fri Sat Leighton Ruff, MD   5 mg at 56/25/63 1720   And  . [START ON 12/30/2017] warfarin (COUMADIN) tablet 2.5 mg  2.5 mg Oral Q [id number] Leighton Ruff, MD      . Warfarin - Physician Dosing Inpatient   Does not apply [id number] Leighton Ruff, MD         Allergies  Allergen Reactions  . Gadolinium Derivatives Hives, Itching and Other (See Comments)    Dr. Jeralyn Ruths s/w Mr. [place]. We observed him for 15 minutes. He did not need to take Benadryl. The symptoms began to subside before he left.     Signed: Morton Peters, MD, FACS, MASCRS Gastrointestinal and Minimally Invasive Surgery    [address] [place], [place] [address] [phone] Main / Paging [phone] Fax   12/29/2017, 8:03 AM

## 2017-12-29 NOTE — Progress Notes (Signed)
Assessment unchanged. Pt and son verbalized understanding of dc instructions through teach back including follow up care, when to call the doctor, and medications to resume. No scripts at dc. Pt discharged via wc to front entrance accompanied by NT and family.

## 2018-01-02 ENCOUNTER — Ambulatory Visit (INDEPENDENT_AMBULATORY_CARE_PROVIDER_SITE_OTHER): Payer: Medicare Other | Admitting: *Deleted

## 2018-01-02 DIAGNOSIS — Z5181 Encounter for therapeutic drug level monitoring: Secondary | ICD-10-CM

## 2018-01-02 DIAGNOSIS — I480 Paroxysmal atrial fibrillation: Secondary | ICD-10-CM | POA: Diagnosis not present

## 2018-01-02 DIAGNOSIS — Z7901 Long term (current) use of anticoagulants: Secondary | ICD-10-CM

## 2018-01-02 LAB — POCT INR: INR: 2 (ref 2.0–3.0)

## 2018-01-02 NOTE — Patient Instructions (Signed)
Description   Continue same dose of coumadin  5mg  (1 tablet) daily except 2.5mg  (1/2 tablet) on Sundays . Recheck INR in 2 weeks . Call with any questions new medications or if scheduled for any other  procedures [phone]

## 2018-01-07 ENCOUNTER — Other Ambulatory Visit: Payer: Self-pay | Admitting: Cardiovascular Disease

## 2018-01-08 ENCOUNTER — Encounter: Payer: Self-pay | Admitting: Oncology

## 2018-01-08 ENCOUNTER — Telehealth: Payer: Self-pay | Admitting: Oncology

## 2018-01-08 DIAGNOSIS — C187 Malignant neoplasm of sigmoid colon: Secondary | ICD-10-CM

## 2018-01-08 NOTE — Telephone Encounter (Signed)
Appt has been scheduled for the pt to see Dr. Benay Spice on 7/11 at 2pm. Letter mailed to the pt.

## 2018-01-09 DIAGNOSIS — L218 Other seborrheic dermatitis: Secondary | ICD-10-CM | POA: Diagnosis not present

## 2018-01-09 DIAGNOSIS — Z85828 Personal history of other malignant neoplasm of skin: Secondary | ICD-10-CM | POA: Diagnosis not present

## 2018-01-15 ENCOUNTER — Ambulatory Visit: Payer: Medicare Other | Admitting: Oncology

## 2018-01-16 ENCOUNTER — Ambulatory Visit (INDEPENDENT_AMBULATORY_CARE_PROVIDER_SITE_OTHER): Payer: Medicare Other | Admitting: *Deleted

## 2018-01-16 DIAGNOSIS — I480 Paroxysmal atrial fibrillation: Secondary | ICD-10-CM | POA: Diagnosis not present

## 2018-01-16 DIAGNOSIS — Z7901 Long term (current) use of anticoagulants: Secondary | ICD-10-CM | POA: Diagnosis not present

## 2018-01-16 LAB — POCT INR: INR: 2.4 (ref 2.0–3.0)

## 2018-01-16 NOTE — Patient Instructions (Signed)
Description   Continue same dose of coumadin 5mg  (1 tablet) daily except 2.5mg  (1/2 tablet) on Sundays. Recheck INR in 3 weeks. Call with any questions new medications or if scheduled for any other  procedures [phone]

## 2018-01-17 ENCOUNTER — Ambulatory Visit: Payer: Medicare Other | Admitting: Oncology

## 2018-01-18 ENCOUNTER — Telehealth: Payer: Self-pay

## 2018-01-18 NOTE — Telephone Encounter (Signed)
Spoke with pt and daughter to inform of appt on 7/23 at 2:00pm

## 2018-01-21 ENCOUNTER — Telehealth: Payer: Self-pay | Admitting: Oncology

## 2018-01-21 NOTE — Telephone Encounter (Signed)
Pt's appointment with Dr. Benay Spice has been rescheduled to 7/23 at 2pm. Spoke to the pt's daughter, [name] and provided the new appt date and time. I confirmed the appt date and time with Mr. Lacroix as well.

## 2018-01-29 ENCOUNTER — Inpatient Hospital Stay: Payer: Medicare Other | Attending: Oncology | Admitting: Oncology

## 2018-01-29 ENCOUNTER — Telehealth: Payer: Self-pay | Admitting: Oncology

## 2018-01-29 VITALS — BP 176/71 | HR 48 | Resp 17 | Ht 71.0 in | Wt 164.9 lb

## 2018-01-29 DIAGNOSIS — R918 Other nonspecific abnormal finding of lung field: Secondary | ICD-10-CM | POA: Diagnosis not present

## 2018-01-29 DIAGNOSIS — Z9049 Acquired absence of other specified parts of digestive tract: Secondary | ICD-10-CM

## 2018-01-29 DIAGNOSIS — C19 Malignant neoplasm of rectosigmoid junction: Secondary | ICD-10-CM | POA: Diagnosis not present

## 2018-01-29 DIAGNOSIS — C187 Malignant neoplasm of sigmoid colon: Secondary | ICD-10-CM

## 2018-01-29 NOTE — Telephone Encounter (Signed)
Scheduled appt per 7/23 los - gave patient AVS and calender per los.

## 2018-01-29 NOTE — Progress Notes (Signed)
[facility] New Patient Consult   Referring MD: Marton Redwood, [place], [name] [address]   [name] 84 y.o.  [birth year]    Reason for Referral: Colon cancer   HPI: [name] was placed on Coumadin after being diagnosed with atrial fibrillation.  He developed rectal bleeding while on Coumadin.  He saw Dr. Brigitte Pulse.  He was referred for a virtual colonoscopy 10/26/2017.  An apple core lesion was noted in the mid to distal sigmoid colon.  Diverticulosis was seen in the sigmoid colon. He was referred to Dr. Henrene Pastor and was taken to a flexible sigmoidoscopy on 11/12/2017.  A partially obstructing mass was found in the distal sigmoid colon at 20 cm from the anal verge.  Mild oozing was present.  The mass was biopsied and the area was tattooed.  Multiple diverticula were found in the sigmoid colon.  The pathology confirmed adenocarcinoma and fragments of a tubular adenoma.  CTs of the chest, abdomen, and pelvis on 11/20/2017 revealed changes of emphysema.  A sub-solid 1.9 x 1.3 cm nodule was noted in the anterior left upper lobe with a 0.5 cm solid component.  The liver appeared normal.  A short segment of wall thickening was noted in the distal sigmoid colon.  No pathologically enlarged lymph nodes in the abdomen or pelvis.  Moderate prostatomegaly.  He was referred to Dr. Marcello Moores and was taken to the operating room on 12/26/2017 for a laparoscopic sigmoidectomy.  There was no evidence of metastatic disease.  The anastomosis is at approximate 6 cm from the anal verge.  The pathology ([id number]) revealed an invasive moderately differentiated adenocarcinoma involving the distal sigmoid and proximal rectum.  Tumor invaded into subserosal soft tissue.  The resection margins are negative.  No lymphovascular or perineural invasion.  13 lymph nodes were negative for metastatic carcinoma.  No tumor perforation.  No tumor deposits.  The tumor returned MSI-stable with no loss of  mismatch repair protein expression.    Past Medical History:  Diagnosis Date  . A-fib ([place])   . Arthritis   . BPH (benign prostatic hyperplasia)   . Chest pain, unspecified   . Chronic kidney disease   . Coronary atherosclerosis of unspecified type of vessel, native or graft   .  Sigmoid colon cancer, stage II (T3N0)   12/26/2017  . Hyperlipidemia   . Hypertension   . Myocardial infarction ([place])    x2  . Non-ST elevation (NSTEMI) myocardial infarction ([place])   . Peripheral neuropathy   . PVD (peripheral vascular disease) ([place])   . Shortness of breath    exertion  . Vitamin B12 deficiency     Past Surgical History:  Procedure Laterality Date  . COLON SURGERY     partial colectomy Dr. Marcello Moores 12-14-17  . CORONARY ANGIOPLASTY     4 stents  . CORONARY STENT INTERVENTION N/A 08/09/2017   Procedure: CORONARY STENT INTERVENTION;  Surgeon: Nelva Bush, MD;  Location: [facility];  Service: Cardiovascular;  Laterality: N/A;  . FLEXIBLE SIGMOIDOSCOPY N/A 11/12/2017   Procedure: FLEXIBLE SIGMOIDOSCOPY;  Surgeon: Irene Shipper, MD;  [facility];  Service: Endoscopy;  Laterality: N/A;  . HERNIA REPAIR     right inguinal  . INGUINAL HERNIA REPAIR  06/14/2012   Procedure: HERNIA REPAIR INGUINAL ADULT;  Surgeon: Odis Hollingshead, MD;  Location: [facility];  Service: General;  Laterality: Right;  . INSERTION OF MESH  06/14/2012   Procedure: INSERTION OF MESH;  Surgeon: Odis Hollingshead, MD;  Location: [facility];  Service: General;  Laterality: Right;  . LAPAROSCOPIC PARTIAL COLECTOMY N/A 12/26/2017   Procedure: LAPAROSCOPIC PARTIAL COLECTOMY ERAS PATHWAY;  Surgeon: Leighton Ruff, MD;  [facility];  Service: General;  Laterality: N/A;  . LEFT HEART CATH AND CORONARY ANGIOGRAPHY N/A 08/09/2017   Procedure: LEFT HEART CATH AND CORONARY ANGIOGRAPHY;  Surgeon: Nelva Bush, MD;  Location: [facility];  Service: Cardiovascular;  Laterality: N/A;  . PROCTOSCOPY N/A 12/26/2017    Procedure: PROCTOSCOPY;  Surgeon: Leighton Ruff, MD;  [facility];  Service: General;  Laterality: N/A;  . SINUS SURGERY WITH INSTATRAK  1985    Medications: Reviewed  Allergies:  Allergies  Allergen Reactions  . Gadolinium Derivatives Hives, Itching and Other (See Comments)    Dr. Jeralyn Ruths s/w [name]. We observed him for 15 minutes. He did not need to take Benadryl. The symptoms began to subside before he left.     Family history: His mother died of a brain tumor  Social History:   He lives alone.  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  He works in [employer].  He quit smoking cigarettes in 1983.  He quit alcohol in 1977.  No transfusion history.  No risk factor for HIV or hepatitis.  ROS:   Positives include: Balance difficulty for the past year, diffuse aches and pains, nocturia, urinary hesitancy, episodes of incontinence since undergoing colon surgery- none since January 25, 2018, bilateral leg swelling for several months, episodes of dyspnea unrelated to exertion, tremor of the left hand when using the hand to eat  A complete ROS was otherwise negative.  Physical Exam:  Blood pressure (!) 176/71, pulse (!) 48, resp. rate 17, height _0  (1.803 m), weight 164 lb 14.4 oz (74.8 kg), SpO2 100 %.  HEENT: Multiple missing teeth, oropharynx without visible mass, neck without mass Lungs: Clear bilaterally Cardiac: Regular rate and rhythm Abdomen: No hepatosplenomegaly, no mass, nontender, healed surgical incisions GU: Testes without mass Vascular: Trace edema at the right greater than left ankle, bilateral lower leg varicosities Lymph nodes: No cervical, supraclavicular, axillary, or inguinal nodes Neurologic: Alert and oriented, the motor exam appears intact in the upper and lower extremities, finger-to-nose testing is normal, intention tremor of the left hand Skin: No rash Musculoskeletal: No spine tenderness   LAB:  CBC  Lab Results  Component Value  Date   WBC 8.6 12/29/2017   HGB 11.5 (L) 12/29/2017   HCT 35.6 (L) 12/29/2017   MCV 86.6 12/29/2017   PLT 168 12/29/2017   NEUTROABS 4.8 06/04/2012        CMP  Lab Results  Component Value Date   NA 139 12/29/2017   K 4.3 12/29/2017   CL 106 12/29/2017   CO2 27 12/29/2017   GLUCOSE 100 (H) 12/29/2017   BUN 30 (H) 12/29/2017   CREATININE 1.19 12/29/2017   CALCIUM 8.3 (L) 12/29/2017   PROT 8.0 11/14/2016   ALBUMIN 4.1 11/14/2016   AST 17 11/14/2016   ALT 12 (L) 11/14/2016   ALKPHOS 67 11/14/2016   BILITOT 1.8 (H) 11/14/2016   GFRNONAA 53 (L) 12/29/2017   GFRAA >60 12/29/2017     Lab Results  Component Value Date   CEA1 1.2 12/10/2017    Imaging:  As per HPI, CT images from 11/20/2017 reviewed with [name]   Assessment/Plan:   1. Sigmoid colon cancer, stage II (T3N0), status post a sigmoidectomy 12/26/2017  Tumor involving  the sigmoid colon and proximal rectum  MSI-stable, no loss of mismatch repair protein expression  Staging CTs of the chest, abdomen, and pelvis on 11/20/2017- focal wall thickening at the distal sigmoid colon, no evidence of metastatic disease, indeterminate 1.9 cm sub-solid left upper lobe nodule 2. Coronary artery disease 3. Atrial fibrillation 4. BPH 5. Left arm/hand intention tremor 6. Report of balance difficulty 7. COPD 8. Indeterminate left upper lobe nodule on the staging chest CT 11/20/2017 9. Renal insufficiency   Disposition:   [name] has been diagnosed with stage II colon cancer.  I reviewed the details of the surgical pathology report with [name].  He has a good prognosis for a long-term disease-free survival.  His tumor does not have "high risk" features.  I do not recommend adjuvant chemotherapy.  The left lung lesion is likely benign finding.  We will schedule a repeat chest CT when he returns for an office visit in 4 months.  [name] does not appear to have hereditary non-polyposis colon cancer  syndrome.  However his family members are at increased risk of developing colorectal cancer.  He will alert them of his diagnosis so they can receive appropriate screening.  We discussed diet and exercise maneuvers that may decrease the risk of developing colon cancer.  He will discuss the indication for a surveillance colonoscopy with Dr. Henrene Pastor.  50 minutes were spent with the patient today.  The majority of the time was used for counseling and coordination of care.  Betsy Coder, MD  01/29/2018, 2:53 PM

## 2018-02-06 ENCOUNTER — Ambulatory Visit (INDEPENDENT_AMBULATORY_CARE_PROVIDER_SITE_OTHER): Payer: Medicare Other | Admitting: Pharmacist

## 2018-02-06 DIAGNOSIS — Z7901 Long term (current) use of anticoagulants: Secondary | ICD-10-CM | POA: Diagnosis not present

## 2018-02-06 DIAGNOSIS — I480 Paroxysmal atrial fibrillation: Secondary | ICD-10-CM

## 2018-02-06 LAB — POCT INR: INR: 2.5 (ref 2.0–3.0)

## 2018-02-06 NOTE — Patient Instructions (Signed)
Description   Continue same dose of coumadin 5mg  (1 tablet) daily except 2.5mg  (1/2 tablet) on Sundays. Recheck INR in 4 weeks. Call with any questions new medications or if scheduled for any other  procedures [phone]

## 2018-03-04 ENCOUNTER — Ambulatory Visit (INDEPENDENT_AMBULATORY_CARE_PROVIDER_SITE_OTHER): Payer: Medicare Other | Admitting: Cardiovascular Disease

## 2018-03-04 ENCOUNTER — Ambulatory Visit (INDEPENDENT_AMBULATORY_CARE_PROVIDER_SITE_OTHER): Payer: Medicare Other

## 2018-03-04 VITALS — BP 128/62 | HR 60 | Ht 71.0 in | Wt 170.0 lb

## 2018-03-04 DIAGNOSIS — I255 Ischemic cardiomyopathy: Secondary | ICD-10-CM | POA: Diagnosis not present

## 2018-03-04 DIAGNOSIS — I480 Paroxysmal atrial fibrillation: Secondary | ICD-10-CM | POA: Diagnosis not present

## 2018-03-04 DIAGNOSIS — I10 Essential (primary) hypertension: Secondary | ICD-10-CM

## 2018-03-04 DIAGNOSIS — Z7901 Long term (current) use of anticoagulants: Secondary | ICD-10-CM

## 2018-03-04 DIAGNOSIS — I2510 Atherosclerotic heart disease of native coronary artery without angina pectoris: Secondary | ICD-10-CM

## 2018-03-04 LAB — BASIC METABOLIC PANEL
BUN / CREAT RATIO: 15 (ref 10–24)
BUN: 18 mg/dL (ref 8–27)
CO2: 26 mmol/L (ref 20–29)
CREATININE: 1.17 mg/dL (ref 0.76–1.27)
Calcium: 9.6 mg/dL (ref 8.6–10.2)
Chloride: 101 mmol/L (ref 96–106)
GFR calc Af Amer: 65 mL/min/{1.73_m2} (ref >59)
GFR calc non Af Amer: 56 mL/min/{1.73_m2} — ABNORMAL LOW (ref >59)
Glucose: 101 mg/dL — ABNORMAL HIGH (ref 65–99)
Potassium: 3.8 mmol/L (ref 3.5–5.2)
SODIUM: 140 mmol/L (ref 134–144)

## 2018-03-04 LAB — POCT INR: INR: 2.3 (ref 2.0–3.0)

## 2018-03-04 NOTE — Patient Instructions (Signed)
Medication Instructions:  Your physician recommends that you continue on your current medications as directed. Please refer to the Current Medication list given to you today.   Labwork: Lab work to be done today--BMP  Testing/Procedures: Your physician has requested that you have an echocardiogram. Echocardiography is a painless test that uses sound waves to create images of your heart. It provides your doctor with information about the size and shape of your heart and how well your heart's chambers and valves are working. This procedure takes approximately one hour. There are no restrictions for this procedure.    Follow-Up: Your physician recommends that you schedule a follow-up appointment in: 6 months. Please call our office in about 2 months to schedule this appointment   Any Other Special Instructions Will Be Listed Below (If Applicable).     If you need a refill on your cardiac medications before your next appointment, please call your pharmacy.

## 2018-03-04 NOTE — Patient Instructions (Signed)
Description   Continue same dose of coumadin 5mg  (1 tablet) daily except 2.5mg  (1/2 tablet) on Sundays. Recheck INR in 6 weeks. Call with any questions new medications or if scheduled for any other  procedures [phone]

## 2018-03-04 NOTE — Progress Notes (Signed)
Chief Complaint  Patient presents with  . Follow-up    CAD     History of Present Illness: 84 yo male with history of atrial fibrillation, ischemic cardiomyopathy, CAD, HTN, PVCs and colon cancer who is here today for follow up. First cardiac stents placed in the Circumflex and LAD in 2007. The stents were Taxus drug eluting stents. LV function was normal at that time. I saw him 01/15/17 and he had c/o leg weakness, dizziness. He had one fall due to sudden onset of weakness in his left leg. 48 hour cardiac monitor showed sinus bradycardia with PVCs, PACs and a short run of a wide complex tachycardia. Echo 01/25/17 with normal LV systolic function, no significant valve disease. He was admitted to [facility]17 with a NSTEMI and was found to have severe disease in the LAD and Circumflex, both treated with drug eluting stents. (2 drug eluting stents placed in the Circumflex and one drug eluting stent placed in the LAD). He was also found to have atrial fibrillation and was started on coumadin. He did not wish to start Eliquis due to lack of medication coverage with his insurance.  Echo February 2019 with LVEF=40%. He had rectal bleeding and was found to have a mass in the sigmoid colon that turned out to be adenocarcinoma. He had a laparoscopic sigmoid colectomy in June 2019. He is being followed by Dr. Benay Spice in the Oncology office.   He is here today for follow up. The patient denies any chest pain, dyspnea, palpitations, lower extremity edema, orthopnea, PND, dizziness, near syncope or syncope. He feels great.    Primary Care Physician: Marton Redwood, MD  Past Medical History:  Diagnosis Date  . A-fib ([place])   . Arthritis   . BPH (benign prostatic hyperplasia)   . Chest pain, unspecified   . Chronic kidney disease   . Coronary atherosclerosis of unspecified type of vessel, native or graft   . Dizziness and giddiness   . Hyperlipidemia   . Hypertension   . Myocardial infarction ([place])    x2    . Non-ST elevation (NSTEMI) myocardial infarction ([place])   . Peripheral neuropathy   . PVD (peripheral vascular disease) ([place])   . Shortness of breath    exertion  . Vitamin B12 deficiency     Past Surgical History:  Procedure Laterality Date  . COLON SURGERY     partial colectomy Dr. Marcello Moores 12-14-17  . CORONARY ANGIOPLASTY     4 stents  . CORONARY STENT INTERVENTION N/A 08/09/2017   Procedure: CORONARY STENT INTERVENTION;  Surgeon: Nelva Bush, MD;  Location: [facility];  Service: Cardiovascular;  Laterality: N/A;  . FLEXIBLE SIGMOIDOSCOPY N/A 11/12/2017   Procedure: FLEXIBLE SIGMOIDOSCOPY;  Surgeon: Irene Shipper, MD;  [facility];  Service: Endoscopy;  Laterality: N/A;  . HERNIA REPAIR     right inguinal  . INGUINAL HERNIA REPAIR  06/14/2012   Procedure: HERNIA REPAIR INGUINAL ADULT;  Surgeon: Odis Hollingshead, MD;  Location: [facility];  Service: General;  Laterality: Right;  . INSERTION OF MESH  06/14/2012   Procedure: INSERTION OF MESH;  Surgeon: Odis Hollingshead, MD;  Location: [facility];  Service: General;  Laterality: Right;  . LAPAROSCOPIC PARTIAL COLECTOMY N/A 12/26/2017   Procedure: LAPAROSCOPIC PARTIAL COLECTOMY ERAS PATHWAY;  Surgeon: Leighton Ruff, MD;  [facility];  Service: General;  Laterality: N/A;  . LEFT HEART CATH AND CORONARY ANGIOGRAPHY N/A 08/09/2017   Procedure: LEFT HEART CATH  AND CORONARY ANGIOGRAPHY;  Surgeon: Nelva Bush, MD;  Location: [facility];  Service: Cardiovascular;  Laterality: N/A;  . PROCTOSCOPY N/A 12/26/2017   Procedure: PROCTOSCOPY;  Surgeon: Leighton Ruff, MD;  [facility];  Service: General;  Laterality: N/A;  . SINUS SURGERY WITH INSTATRAK  1985    Current Outpatient Medications  Medication Sig Dispense Refill  . atorvastatin (LIPITOR) 40 MG tablet TAKE ONE TABLET DAILY AT 6PM 90 tablet 2  . clopidogrel (PLAVIX) 75 MG tablet TAKE ONE TABLET EACH DAY (Patient taking differently: Take 75 mg by mouth  daily. ) 90 tablet 3  . metoprolol succinate (TOPROL-XL) 25 MG 24 hr tablet Take 3 tablets (75 mg total) by mouth daily. 90 tablet 9  . traMADol (ULTRAM) 50 MG tablet Take 1-2 tablets (50-100 mg total) by mouth every 6 (six) hours as needed for moderate pain or severe pain. 20 tablet 0  . vitamin B-12 (CYANOCOBALAMIN) 1000 MCG tablet Take 1,000 mcg by mouth daily.    Marland Kitchen warfarin (COUMADIN) 5 MG tablet TAKE AS DIRECTED BY COUMADIN CLINIC (Patient taking differently: Take 5 mg by mouth daily in the evening except 2.5 mg on Sundays) 30 tablet 2   No current facility-administered medications for this visit.     Allergies  Allergen Reactions  . Gadolinium Derivatives Hives, Itching and Other (See Comments)    Dr. Jeralyn Ruths s/w Mr. Cullens. We observed him for 15 minutes. He did not need to take Benadryl. The symptoms began to subside before he left.     Social History   Socioeconomic History  . Marital status: Divorced    Spouse name: Not on file  . Number of children: 3  . Years of education: Not on file  . Highest education level: Not on file  Occupational History  . Occupation: Magazine features editor    Comment: [employer]  Social Needs  . Financial resource strain: Not on file  . Food insecurity:    Worry: Not on file    Inability: Not on file  . Transportation needs:    Medical: Not on file    Non-medical: Not on file  Tobacco Use  . Smoking status: Former Smoker    Last attempt to quit: 07/10/1981    Years since quitting: 36.6  . Smokeless tobacco: Never Used  Substance and Sexual Activity  . Alcohol use: No  . Drug use: No  . Sexual activity: Not on file  Lifestyle  . Physical activity:    Days per week: Not on file    Minutes per session: Not on file  . Stress: Not on file  Relationships  . Social connections:    Talks on phone: Not on file    Gets together: Not on file    Attends religious service: Not on file    Active member of club or organization: Not on  file    Attends meetings of clubs or organizations: Not on file    Relationship status: Not on file  . Intimate partner violence:    Fear of current or ex partner: Not on file    Emotionally abused: Not on file    Physically abused: Not on file    Forced sexual activity: Not on file  Other Topics Concern  . Not on file  Social History Narrative   Regular exercise          Family History  Problem Relation Age of Onset  . Heart attack Father 57  .  Arthritis Sister   . Hypothyroidism Daughter     Review of Systems:  As stated in the HPI and otherwise negative.   BP 128/62   Pulse 60   Ht 5\' 11"  (1.803 m)   Wt 170 lb (77.1 kg)   BMI 23.71 kg/m   Physical Examination: General: Well developed, well nourished, NAD  HEENT: OP clear, mucus membranes moist  SKIN: warm, dry. No rashes. Neuro: No focal deficits  Musculoskeletal: Muscle strength 5/5 all ext  Psychiatric: Mood and affect normal  Neck: No JVD, no carotid bruits, no thyromegaly, no lymphadenopathy.  Lungs:Clear bilaterally, no wheezes, rhonci, crackles Cardiovascular: Regular rate and rhythm. No murmurs, gallops or rubs. Abdomen:Soft. Bowel sounds present. Non-tender.  Extremities: No lower extremity edema. Pulses are 2 + in the bilateral DP/PT.  Echo 08/10/17: - Left ventricle: The cavity size was normal. Wall thickness was   increased in a pattern of mild LVH. Systolic function was   moderately reduced. The estimated ejection fraction was in the   range of 35% to 40%. Global hypokinesis with regional variation.   The study is not technically sufficient to allow evaluation of LV   diastolic function. - Mitral valve: Mildly thickened leaflets . There was trivial   regurgitation. - Left atrium: The atrium was normal in size. - Inferior vena cava: The vessel was dilated. The respirophasic   diameter changes were blunted (< 50%), consistent with elevated   central venous pressure.  Impressions:  - LVEF  35-40%, mild LVH, global hypokinesis with regional   variation, trivial MR, normal LA size, dilated IVC.  Cardiac cath 08/09/17:  1. Severe 2-vessel coronary artery disease, including 80% in-stent restenosis of the mid LAD with disease extending beyond the distal stent edge, as well as 70% ostial/proximal LCx disease followed by multifocal mid and distal LCx in-stent restenosis of up to 99%. 2. Moderate, non-obstructive RCA disease. 3. Normal left ventricular filling pressure. 4. Successful PCI to mid LAD with placement of Resolute Onyx 2.75 x 34 mm drug eluting stent (post-dilated to 3.1 mm) with 0% residual stenosis and TIMI-3 flow. 5. Successful PCI to LCx with placement of Resolute Onyx drug-eluting stents extending from the ostium to the proximal segment of old stent (2.5 x 18 mm) and covering the distal 1/3 of old stent extending into the distal LCx (2.0 x 30 mm) with 0% residual stenosis and TIMI-3 flow.  EKG:  EKG is not ordered today. The ekg ordered today demonstrates   Recent Labs: 08/08/2017: TSH 0.926 08/12/2017: Magnesium 2.2 12/29/2017: BUN 30; Creatinine, Ser 1.19; Hemoglobin 11.5; Platelets 168; Potassium 4.3; Sodium 139   Lipid Panel    Component Value Date/Time   CHOL 160 08/09/2017 0359   TRIG 74 08/09/2017 0359   HDL 41 08/09/2017 0359   CHOLHDL 3.9 08/09/2017 0359   VLDL 15 08/09/2017 0359   LDLCALC 104 (H) 08/09/2017 0359     Wt Readings from Last 3 Encounters:  03/04/18 170 lb (77.1 kg)  01/29/18 164 lb 14.4 oz (74.8 kg)  12/28/17 184 lb 3.2 oz (83.6 kg)     Other studies Reviewed: Additional studies/ records that were reviewed today include: . Review of the above records demonstrates:    Assessment and Plan:   1. CAD without angina: He is doing well. No chest pain. Will continue Plavix, statin and beta blocker. He is not on an ASA since he is on coumadin as well.    2. Atrial fibrillation, paroxysmal: He appears to be  in sinus today. Continue coumadin  and beta blocker.   3. HTN: BP is controlled. No changes  4. Ischemic cardiomyopathy: LVEF around 40% by echo February 2019. This may be an ischemic cardiomyopathy and hopefully LV function will normalize following his PCI. Will repeat echo now. Continue the beta blocker. His Ace-inh was held due to renal insufficiency.  Will repeat BMET now.    5. Colon cancer: He is s/p sigmoid colectomy.    Current medicines are reviewed at length with the patient today.  The patient does not have concerns regarding medicines.  The following changes have been made:  no change  Labs/ tests ordered today include:   Orders Placed This Encounter  Procedures  . Basic Metabolic Panel (BMET)  . ECHOCARDIOGRAM COMPLETE    Disposition:   FU with me in 6  months  Signed, Lauree Chandler, MD 03/04/2018 10:47 AM    [facility] [address], [place], [place]  [address] Phone: [phone]; Fax: [phone]

## 2018-03-05 ENCOUNTER — Telehealth: Payer: Self-pay | Admitting: Cardiovascular Disease

## 2018-03-05 ENCOUNTER — Other Ambulatory Visit: Payer: Self-pay

## 2018-03-05 DIAGNOSIS — Z7901 Long term (current) use of anticoagulants: Secondary | ICD-10-CM | POA: Diagnosis not present

## 2018-03-05 DIAGNOSIS — I10 Essential (primary) hypertension: Secondary | ICD-10-CM

## 2018-03-05 DIAGNOSIS — Z9861 Coronary angioplasty status: Secondary | ICD-10-CM | POA: Diagnosis not present

## 2018-03-05 DIAGNOSIS — R7301 Impaired fasting glucose: Secondary | ICD-10-CM | POA: Diagnosis not present

## 2018-03-05 DIAGNOSIS — E7849 Other hyperlipidemia: Secondary | ICD-10-CM | POA: Diagnosis not present

## 2018-03-05 DIAGNOSIS — I480 Paroxysmal atrial fibrillation: Secondary | ICD-10-CM | POA: Diagnosis not present

## 2018-03-05 DIAGNOSIS — R42 Dizziness and giddiness: Secondary | ICD-10-CM | POA: Diagnosis not present

## 2018-03-05 DIAGNOSIS — Z85038 Personal history of other malignant neoplasm of large intestine: Secondary | ICD-10-CM | POA: Diagnosis not present

## 2018-03-05 DIAGNOSIS — Z6823 Body mass index (BMI) 23.0-23.9, adult: Secondary | ICD-10-CM | POA: Diagnosis not present

## 2018-03-05 MED ORDER — LISINOPRIL 5 MG PO TABS: 5.0000 mg | ORAL_TABLET | Freq: Every day | ORAL | 3 refills | Status: DC

## 2018-03-05 NOTE — Telephone Encounter (Signed)
Please call with lab results 

## 2018-03-05 NOTE — Telephone Encounter (Signed)
Spoke to patient and informed him of results/recommendations per Dr Angelena Form.  He verbalized understanding.

## 2018-03-12 ENCOUNTER — Other Ambulatory Visit: Payer: Medicare Other | Admitting: *Deleted

## 2018-03-12 DIAGNOSIS — I10 Essential (primary) hypertension: Secondary | ICD-10-CM

## 2018-03-13 ENCOUNTER — Other Ambulatory Visit: Payer: Self-pay | Admitting: *Deleted

## 2018-03-13 DIAGNOSIS — I10 Essential (primary) hypertension: Secondary | ICD-10-CM

## 2018-03-13 DIAGNOSIS — R7989 Other specified abnormal findings of blood chemistry: Secondary | ICD-10-CM

## 2018-03-13 LAB — BASIC METABOLIC PANEL
BUN/Creatinine Ratio: 18 (ref 10–24)
BUN: 24 mg/dL (ref 8–27)
CO2: 25 mmol/L (ref 20–29)
Calcium: 8.9 mg/dL (ref 8.6–10.2)
Chloride: 104 mmol/L (ref 96–106)
Creatinine, Ser: 1.34 mg/dL — ABNORMAL HIGH (ref 0.76–1.27)
GFR, EST AFRICAN AMERICAN: 55 mL/min/{1.73_m2} — AB (ref >59)
GFR, EST NON AFRICAN AMERICAN: 48 mL/min/{1.73_m2} — AB (ref >59)
Glucose: 95 mg/dL (ref 65–99)
POTASSIUM: 3.9 mmol/L (ref 3.5–5.2)
SODIUM: 145 mmol/L — AB (ref 134–144)

## 2018-03-13 MED ORDER — LISINOPRIL 5 MG PO TABS: 2.5000 mg | ORAL_TABLET | Freq: Every day | ORAL | Status: DC

## 2018-03-13 NOTE — Progress Notes (Signed)
Notes recorded by Burnell Blanks, MD on 03/13/2018 at 2:44 PM EDT I would reduce the Lisinopril to 2.5 mg daily and repeat BMET in 2 weeks. Thanks,chris

## 2018-03-15 ENCOUNTER — Ambulatory Visit (INDEPENDENT_AMBULATORY_CARE_PROVIDER_SITE_OTHER): Payer: Medicare Other | Admitting: Orthopedic Surgery

## 2018-03-15 ENCOUNTER — Encounter (INDEPENDENT_AMBULATORY_CARE_PROVIDER_SITE_OTHER): Payer: Self-pay | Admitting: Orthopedic Surgery

## 2018-03-15 ENCOUNTER — Ambulatory Visit (INDEPENDENT_AMBULATORY_CARE_PROVIDER_SITE_OTHER): Payer: Medicare Other

## 2018-03-15 DIAGNOSIS — M25512 Pain in left shoulder: Secondary | ICD-10-CM | POA: Diagnosis not present

## 2018-03-15 DIAGNOSIS — I255 Ischemic cardiomyopathy: Secondary | ICD-10-CM

## 2018-03-15 NOTE — Progress Notes (Signed)
Office Visit Note   Patient: [name]           Date of Birth: Mar 01, 1930           MRN: [id number] Visit Date: 03/15/2018 Requested by: Marton Redwood, MD [address] [place], [place] [address] PCP: Marton Redwood, MD  Subjective: Chief Complaint  Patient presents with  . Left Shoulder - Pain    HPI: Fender is a patient with left shoulder pain.'s been bothering him for several months.  Is actually been worse after fishing for 2 hours this past weekend.  He is left-hand-dominant.  He did a lot of "damage" to his shoulder by his report playing baseball.  He is not taking much medication for this problem.  Denies much in the way of mechanical symptoms.  He states it is getting better over the past several days.              ROS: All systems reviewed are negative as they relate to the chief complaint within the history of present illness.  Patient denies  fevers or chills.   Assessment & Plan: Visit Diagnoses:  1. Left shoulder pain, unspecified chronicity     Plan: Impression is left shoulder pain.  Suspect overuse bursitis.  No real arthritis on plain radiographs.  His acromial spur may set him up for a little bursitis which is likely the case here.  We talked about subacromial injection today but he wants to hold off on that intervention since his shoulder is gradually improving.  If is not where he wants it to be in a month we should consider that injection.  Follow-up with me as needed  Follow-Up Instructions: Return if symptoms worsen or fail to improve.   Orders:  Orders Placed This Encounter  Procedures  . XR Shoulder Left   No orders of the defined types were placed in this encounter.     Procedures: No procedures performed   Clinical Data: No additional findings.  Objective: Vital Signs: There were no vitals taken for this visit.  Physical Exam:   Constitutional: Patient appears well-developed HEENT:  Head: Normocephalic Eyes:EOM are normal Neck:  Normal range of motion Cardiovascular: Normal rate Pulmonary/chest: Effort normal Neurologic: Patient is alert Skin: Skin is warm Psychiatric: Patient has normal mood and affect    Ortho Exam: Ortho exam demonstrates good rotator cuff strength on the left infraspinatus supraspinatus and subscap muscle testing.  Impingement signs positive.  No AC joint tenderness on that left-hand side.  No masses lymphadenopathy or skin changes noted in that left shoulder girdle region.  He does have a little bit of a hitch in the shoulder with internal/external rotation but is not particularly painful.  No Popeye deformity in the left arm.  Specialty Comments:  No specialty comments available.  Imaging: Xr Shoulder Left  Result Date: 03/15/2018 AP lateral outlet left shoulder reviewed.  No glenohumeral arthritis is present.  Moderate degenerative changes of the [facility] joint noted.  Significant acromial spur type III is noted.  Shoulder is located.  No fracture is seen.    PMFS History: Patient Active Problem List   Diagnosis Date Noted  . Vitamin B12 deficiency 12/29/2017  . Cancer of sigmoid colon s/p sigmoid colectomy 12/26/2017 12/26/2017  . Long term (current) use of anticoagulants [Z79.01] 08/15/2017  . Ischemic cardiomyopathy 08/13/2017  . Hypertension 08/13/2017  . Atrial fibrillation ([place]) 08/08/2017  . Hyperlipidemia 11/09/2009  . LBBB 11/09/2009  . Coronary atherosclerosis 10/05/2008  . DIZZINESS  10/05/2008  . CHEST PAIN-UNSPECIFIED 10/05/2008   Past Medical History:  Diagnosis Date  . A-fib ([place])   . Arthritis   . BPH (benign prostatic hyperplasia)   . Chest pain, unspecified   . Chronic kidney disease   . Coronary atherosclerosis of unspecified type of vessel, native or graft   . Dizziness and giddiness   . Hyperlipidemia   . Hypertension   . Myocardial infarction ([place])    x2  . Non-ST elevation (NSTEMI) myocardial infarction ([place])   . Peripheral neuropathy   . PVD  (peripheral vascular disease) ([place])   . Shortness of breath    exertion  . Vitamin B12 deficiency     Family History  Problem Relation Age of Onset  . Heart attack Father 64  . Arthritis Sister   . Hypothyroidism Daughter     Past Surgical History:  Procedure Laterality Date  . COLON SURGERY     partial colectomy Dr. Marcello Moores 12-14-17  . CORONARY ANGIOPLASTY     4 stents  . CORONARY STENT INTERVENTION N/A 08/09/2017   Procedure: CORONARY STENT INTERVENTION;  Surgeon: Nelva Bush, MD;  Location: [facility];  Service: Cardiovascular;  Laterality: N/A;  . FLEXIBLE SIGMOIDOSCOPY N/A 11/12/2017   Procedure: FLEXIBLE SIGMOIDOSCOPY;  Surgeon: Irene Shipper, MD;  Location: [facility];  Service: Endoscopy;  Laterality: N/A;  . HERNIA REPAIR     right inguinal  . INGUINAL HERNIA REPAIR  06/14/2012   Procedure: HERNIA REPAIR INGUINAL ADULT;  Surgeon: Odis Hollingshead, MD;  Location: [facility];  Service: General;  Laterality: Right;  . INSERTION OF MESH  06/14/2012   Procedure: INSERTION OF MESH;  Surgeon: Odis Hollingshead, MD;  Location: [facility];  Service: General;  Laterality: Right;  . LAPAROSCOPIC PARTIAL COLECTOMY N/A 12/26/2017   Procedure: LAPAROSCOPIC PARTIAL COLECTOMY ERAS PATHWAY;  Surgeon: Leighton Ruff, MD;  Location: [facility];  Service: General;  Laterality: N/A;  . LEFT HEART CATH AND CORONARY ANGIOGRAPHY N/A 08/09/2017   Procedure: LEFT HEART CATH AND CORONARY ANGIOGRAPHY;  Surgeon: Nelva Bush, MD;  Location: [facility];  Service: Cardiovascular;  Laterality: N/A;  . PROCTOSCOPY N/A 12/26/2017   Procedure: PROCTOSCOPY;  Surgeon: Leighton Ruff, MD;  Location: [facility];  Service: General;  Laterality: N/A;  . [place]   Social History   Occupational History  . Occupation: Magazine features editor    Comment: [employer]  Tobacco Use  . Smoking status: Former Smoker    Last attempt to quit: 07/10/1981    Years since quitting: 36.7    . Smokeless tobacco: Never Used  Substance and Sexual Activity  . Alcohol use: No  . Drug use: No  . Sexual activity: Not on file

## 2018-03-20 ENCOUNTER — Other Ambulatory Visit: Payer: Self-pay

## 2018-03-20 ENCOUNTER — Ambulatory Visit (HOSPITAL_COMMUNITY): Payer: Medicare Other | Attending: Cardiovascular Disease

## 2018-03-20 DIAGNOSIS — I2510 Atherosclerotic heart disease of native coronary artery without angina pectoris: Secondary | ICD-10-CM

## 2018-03-20 DIAGNOSIS — I447 Left bundle-branch block, unspecified: Secondary | ICD-10-CM | POA: Insufficient documentation

## 2018-03-20 DIAGNOSIS — I4891 Unspecified atrial fibrillation: Secondary | ICD-10-CM | POA: Diagnosis not present

## 2018-03-20 DIAGNOSIS — I255 Ischemic cardiomyopathy: Secondary | ICD-10-CM | POA: Insufficient documentation

## 2018-03-20 DIAGNOSIS — E785 Hyperlipidemia, unspecified: Secondary | ICD-10-CM | POA: Insufficient documentation

## 2018-03-20 DIAGNOSIS — I10 Essential (primary) hypertension: Secondary | ICD-10-CM | POA: Insufficient documentation

## 2018-03-20 DIAGNOSIS — Z87891 Personal history of nicotine dependence: Secondary | ICD-10-CM | POA: Insufficient documentation

## 2018-03-25 ENCOUNTER — Other Ambulatory Visit: Payer: Self-pay | Admitting: Cardiovascular Disease

## 2018-03-25 DIAGNOSIS — L821 Other seborrheic keratosis: Secondary | ICD-10-CM | POA: Diagnosis not present

## 2018-03-25 DIAGNOSIS — Z85828 Personal history of other malignant neoplasm of skin: Secondary | ICD-10-CM | POA: Diagnosis not present

## 2018-03-25 DIAGNOSIS — L218 Other seborrheic dermatitis: Secondary | ICD-10-CM | POA: Diagnosis not present

## 2018-03-27 ENCOUNTER — Other Ambulatory Visit: Payer: Medicare Other | Admitting: *Deleted

## 2018-03-27 DIAGNOSIS — R7989 Other specified abnormal findings of blood chemistry: Secondary | ICD-10-CM | POA: Diagnosis not present

## 2018-03-27 DIAGNOSIS — I10 Essential (primary) hypertension: Secondary | ICD-10-CM

## 2018-03-27 LAB — BASIC METABOLIC PANEL
BUN/Creatinine Ratio: 20 (ref 10–24)
BUN: 25 mg/dL (ref 8–27)
CHLORIDE: 103 mmol/L (ref 96–106)
CO2: 26 mmol/L (ref 20–29)
Calcium: 9.3 mg/dL (ref 8.6–10.2)
Creatinine, Ser: 1.26 mg/dL (ref 0.76–1.27)
GFR calc non Af Amer: 51 mL/min/{1.73_m2} — ABNORMAL LOW (ref >59)
GFR, EST AFRICAN AMERICAN: 59 mL/min/{1.73_m2} — AB (ref >59)
GLUCOSE: 92 mg/dL (ref 65–99)
POTASSIUM: 4.2 mmol/L (ref 3.5–5.2)
Sodium: 143 mmol/L (ref 134–144)

## 2018-04-15 ENCOUNTER — Ambulatory Visit (INDEPENDENT_AMBULATORY_CARE_PROVIDER_SITE_OTHER): Payer: Medicare Other | Admitting: *Deleted

## 2018-04-15 DIAGNOSIS — I480 Paroxysmal atrial fibrillation: Secondary | ICD-10-CM | POA: Diagnosis not present

## 2018-04-15 DIAGNOSIS — Z7901 Long term (current) use of anticoagulants: Secondary | ICD-10-CM

## 2018-04-15 LAB — POCT INR: INR: 1.3 — AB (ref 2.0–3.0)

## 2018-04-15 NOTE — Patient Instructions (Signed)
Description   Today and tomorrow take 1.5 tablets then continue same dose of coumadin 5mg  (1 tablet) daily except 2.5mg  (1/2 tablet) on Sundays. Recheck INR in 1 week. Call with any questions new medications or if scheduled for any other  procedures [phone]

## 2018-04-22 ENCOUNTER — Ambulatory Visit (INDEPENDENT_AMBULATORY_CARE_PROVIDER_SITE_OTHER): Payer: Medicare Other | Admitting: *Deleted

## 2018-04-22 DIAGNOSIS — Z7901 Long term (current) use of anticoagulants: Secondary | ICD-10-CM

## 2018-04-22 DIAGNOSIS — I480 Paroxysmal atrial fibrillation: Secondary | ICD-10-CM | POA: Diagnosis not present

## 2018-04-22 LAB — POCT INR: INR: 1.9 — AB (ref 2.0–3.0)

## 2018-04-22 NOTE — Patient Instructions (Signed)
Description   Today take 1.5 tablets of Coumadin then start taking Coumadin 1 tablet daily. Recheck INR in 2 weeks. Call with any questions new medications or if scheduled for any other  procedures [phone]

## 2018-05-09 ENCOUNTER — Ambulatory Visit (INDEPENDENT_AMBULATORY_CARE_PROVIDER_SITE_OTHER): Payer: Medicare Other | Admitting: *Deleted

## 2018-05-09 DIAGNOSIS — I480 Paroxysmal atrial fibrillation: Secondary | ICD-10-CM | POA: Diagnosis not present

## 2018-05-09 DIAGNOSIS — Z7901 Long term (current) use of anticoagulants: Secondary | ICD-10-CM | POA: Diagnosis not present

## 2018-05-09 LAB — POCT INR: INR: 1.4 — AB (ref 2.0–3.0)

## 2018-05-09 NOTE — Patient Instructions (Signed)
Description   Today take 1.5 tablets of Coumadin then start taking Coumadin 1 tablet daily except 1.5 tablets on Mondays and Fridays. Recheck INR in 1 week. Call with any questions new medications or if scheduled for any other  procedures [phone]

## 2018-05-16 ENCOUNTER — Ambulatory Visit (INDEPENDENT_AMBULATORY_CARE_PROVIDER_SITE_OTHER): Payer: Medicare Other | Admitting: Pharmacist

## 2018-05-16 DIAGNOSIS — I480 Paroxysmal atrial fibrillation: Secondary | ICD-10-CM | POA: Diagnosis not present

## 2018-05-16 DIAGNOSIS — Z7901 Long term (current) use of anticoagulants: Secondary | ICD-10-CM

## 2018-05-16 LAB — POCT INR: INR: 2.3 (ref 2.0–3.0)

## 2018-05-16 NOTE — Patient Instructions (Addendum)
Description   Continue taking Coumadin 1 tablet daily except 1.5 tablets on Mondays and Fridays. Recheck INR in 2 weeks. Call with any questions new medications or if scheduled for any other  procedures [phone]

## 2018-05-21 ENCOUNTER — Telehealth: Payer: Self-pay | Admitting: *Deleted

## 2018-05-21 NOTE — Telephone Encounter (Signed)
Patient called and stated he had some blood out his nose this morning and he placed a cotton ball up there to soak up the bleeding and currently it is not bleeding. He states that the blood was drops of blood. The pt is aware to seek medical attention for ongoing nosebleeds at the ER, urgent care, or PCP office and he verbalized understanding.

## 2018-05-27 ENCOUNTER — Other Ambulatory Visit: Payer: Medicare Other

## 2018-05-30 ENCOUNTER — Ambulatory Visit (INDEPENDENT_AMBULATORY_CARE_PROVIDER_SITE_OTHER): Payer: Medicare Other

## 2018-05-30 ENCOUNTER — Ambulatory Visit: Payer: Medicare Other | Admitting: Oncology

## 2018-05-30 DIAGNOSIS — Z7901 Long term (current) use of anticoagulants: Secondary | ICD-10-CM | POA: Diagnosis not present

## 2018-05-30 DIAGNOSIS — I480 Paroxysmal atrial fibrillation: Secondary | ICD-10-CM | POA: Diagnosis not present

## 2018-05-30 LAB — POCT INR: INR: 3.3 — AB (ref 2.0–3.0)

## 2018-05-30 NOTE — Patient Instructions (Signed)
Description   Skip today's dosage of Coumadin, then resume same dosage 1 tablet daily except 1.5 tablets on Mondays and Fridays. Recheck INR in 2 weeks. Call with any questions new medications or if scheduled for any other  procedures [phone]

## 2018-06-03 ENCOUNTER — Inpatient Hospital Stay: Payer: Medicare Other | Attending: Oncology

## 2018-06-03 ENCOUNTER — Encounter (HOSPITAL_COMMUNITY): Payer: Self-pay

## 2018-06-03 ENCOUNTER — Ambulatory Visit (HOSPITAL_COMMUNITY)
Admission: RE | Admit: 2018-06-03 | Discharge: 2018-06-03 | Disposition: A | Discharge status: Home / Self Care | Payer: Medicare Other | Source: Ambulatory Visit | Attending: Oncology | Admitting: Oncology

## 2018-06-03 DIAGNOSIS — I2510 Atherosclerotic heart disease of native coronary artery without angina pectoris: Secondary | ICD-10-CM | POA: Insufficient documentation

## 2018-06-03 DIAGNOSIS — C187 Malignant neoplasm of sigmoid colon: Secondary | ICD-10-CM | POA: Insufficient documentation

## 2018-06-03 DIAGNOSIS — Z85038 Personal history of other malignant neoplasm of large intestine: Secondary | ICD-10-CM | POA: Insufficient documentation

## 2018-06-03 DIAGNOSIS — J449 Chronic obstructive pulmonary disease, unspecified: Secondary | ICD-10-CM | POA: Insufficient documentation

## 2018-06-03 DIAGNOSIS — N289 Disorder of kidney and ureter, unspecified: Secondary | ICD-10-CM | POA: Diagnosis not present

## 2018-06-03 DIAGNOSIS — R918 Other nonspecific abnormal finding of lung field: Secondary | ICD-10-CM | POA: Diagnosis not present

## 2018-06-03 DIAGNOSIS — I4891 Unspecified atrial fibrillation: Secondary | ICD-10-CM | POA: Diagnosis not present

## 2018-06-03 LAB — CEA (IN HOUSE-CHCC): CEA (CHCC-In House): 1.6 ng/mL (ref 0.00–5.00)

## 2018-06-07 ENCOUNTER — Inpatient Hospital Stay (HOSPITAL_BASED_OUTPATIENT_CLINIC_OR_DEPARTMENT_OTHER): Payer: Medicare Other | Admitting: Oncology

## 2018-06-07 VITALS — BP 146/69 | HR 60 | Temp 97.6°F | Resp 18 | Ht 71.0 in | Wt 173.5 lb

## 2018-06-07 DIAGNOSIS — I4891 Unspecified atrial fibrillation: Secondary | ICD-10-CM | POA: Diagnosis not present

## 2018-06-07 DIAGNOSIS — I2510 Atherosclerotic heart disease of native coronary artery without angina pectoris: Secondary | ICD-10-CM

## 2018-06-07 DIAGNOSIS — Z85038 Personal history of other malignant neoplasm of large intestine: Secondary | ICD-10-CM

## 2018-06-07 DIAGNOSIS — N289 Disorder of kidney and ureter, unspecified: Secondary | ICD-10-CM

## 2018-06-07 DIAGNOSIS — J449 Chronic obstructive pulmonary disease, unspecified: Secondary | ICD-10-CM

## 2018-06-07 DIAGNOSIS — C187 Malignant neoplasm of sigmoid colon: Secondary | ICD-10-CM

## 2018-06-07 NOTE — Progress Notes (Addendum)
[facility] [place] OFFICE PROGRESS NOTE   Diagnosis: Colon cancer  INTERVAL HISTORY:   [name] returns as scheduled.  He feels well.  He has irregular bowel habits and thin bowel movements.  No bleeding.  No other complaint.  Objective:  Vital signs in last 24 hours:  Blood pressure (!) 146/69, pulse 60, temperature 97.6 F (36.4 C), temperature source Oral, resp. rate 18, height '5\' 11"'  (1.803 m), weight 173 lb 8 oz (78.7 kg), SpO2 100 %.    HEENT: Neck without mass Lymphatics: No cervical, supraclavicular, axillary, or inguinal nodes Resp: Lungs clear bilaterally Cardio: Regular rhythm GI: No hepatosplenomegaly, no mass, nontender Vascular: No leg edema   Lab Results:    Lab Results  Component Value Date   CEA1 1.60 06/03/2018    Lab Results  Component Value Date   INR 3.3 (A) 05/30/2018    Imaging:  Ct Chest Wo Contrast  Result Date: 06/03/2018 CLINICAL DATA:  History of colon cancer. Follow-up pulmonary nodule. EXAM: CT CHEST WITHOUT CONTRAST TECHNIQUE: Multidetector CT imaging of the chest was performed following the standard protocol without IV contrast. COMPARISON:  11/20/2017 FINDINGS: Cardiovascular: The heart size appears within normal limits. No pericardial effusion. Aortic atherosclerosis. Calcification within the left main, lad, left circumflex and RCA coronary arteries noted. Mediastinum/Nodes: Normal appearance of the thyroid gland. The trachea appears patent and is midline. Normal appearance of the esophagus. No axillary or supraclavicular adenopathy. No mediastinal or hilar adenopathy identified. Lungs/Pleura: Moderate changes of emphysema. No airspace consolidation, atelectasis or pneumothorax. Again seen is a part solid nodule within the anterolateral left upper lobe. On today's exam this measures 2.1 by 1.4 cm and has a central solid component measuring 1.3 cm, image 71/7. Previously this measured 1.9 x 1.3 cm with a central solid component  measuring 0.5 cm. Pure ground-glass attenuating nodule within the superior segment of left lower lobe measures 1.4 cm, image 37/7. Stable Upper Abdomen: No acute abnormality within the abdomen. Aortic atherosclerosis. Musculoskeletal: Spondylosis within the thoracic spine. No aggressive lytic or sclerotic bone lesions. IMPRESSION: 1. There is been interval increase in size of the part solid nodule within the left upper lobe. The central solid component now measures 1.3 cm. Adenocarcinoma is considered. Thoracic surgery consultation is recommended. PET/CT should be considered for staging purposes. These recommendations are taken from: Recommendations for the Management of Subsolid Pulmonary Nodules Detected at CT: A Statement from the [facility] 2013; 266:1, 304-317. 2. Stable pure ground-glass attenuating nodule within the superior segment of left lower lobe measuring 1.4 cm. Adenocarcinoma cannot be excluded. Follow up by CT is recommended in 12 months, with continued annual surveillance for a minimum of 3 years. These recommendations are taken from: Recommendations for the Management of Subsolid Pulmonary Nodules Detected at CT: A Statement from the [facility] 2013; 266:1, 304-317. 3. Aortic Atherosclerosis (ICD10-I70.0) and Emphysema (ICD10-J43.9). 4. Coronary artery atherosclerotic calcifications. Electronically Signed   By: Kerby Moors M.D.   On: 06/03/2018 16:53    Medications: I have reviewed the patient's current medications.   Assessment/Plan: 1. Sigmoid colon cancer, stage II (T3N0), status post a sigmoidectomy 12/26/2017 ? Tumor involving the sigmoid colon and proximal rectum ? MSI-stable, no loss of mismatch repair protein expression ? Staging CTs of the chest, abdomen, and pelvis on 11/20/2017- focal wall thickening at the distal sigmoid colon, no evidence of metastatic disease, indeterminate 1.9 cm sub-solid left upper lobe nodule 2. Coronary artery  disease 3. Atrial fibrillation 4.  BPH 5. Left arm/hand intention tremor 6. Report of balance difficulty 7. COPD 8. Indeterminate left upper lobe nodule on the staging chest CT 11/20/2017  Increase in part solid nodule in the left upper lobe, stable groundglass nodule in the superior segment of the left lower lobe  CT biopsy- adenocarcinoma consistent with lung primary, MSS, tumor mutation burden 13, BRAF [id number], PDL-1 TPS 5%. No EGFR, ALK, ERBB2, ROS alteratation 9. Renal insufficiency    Disposition: [name] remains in clinical remission from colon cancer.  He will return for an office visit and CEA in 6 months.  I reviewed the chest CT images with him.  There are 2 inflammatory appearing lesions in the left lung versus indolent tumors.  I doubt these lesions represent metastatic colon cancer, but could represent indolent lung adenocarcinomas.  I will present his case at the GI tumor conference within the next 2 weeks and decide on the indication for a PET scan or continued surveillance imaging.  15 minutes were spent with the patient today.  The majority of the time was used for counseling and coordination of care.  Betsy Coder, MD  06/07/2018  8:46 AM

## 2018-06-10 ENCOUNTER — Telehealth: Payer: Self-pay | Admitting: Oncology

## 2018-06-10 NOTE — Telephone Encounter (Signed)
Scheduled appt per 11/29 los - sent reminder letter in the mail with apt date and time.

## 2018-06-13 ENCOUNTER — Ambulatory Visit (INDEPENDENT_AMBULATORY_CARE_PROVIDER_SITE_OTHER): Payer: Medicare Other | Admitting: Pharmacist

## 2018-06-13 DIAGNOSIS — I480 Paroxysmal atrial fibrillation: Secondary | ICD-10-CM

## 2018-06-13 DIAGNOSIS — Z7901 Long term (current) use of anticoagulants: Secondary | ICD-10-CM | POA: Diagnosis not present

## 2018-06-13 LAB — POCT INR: INR: 4 — AB (ref 2.0–3.0)

## 2018-06-13 NOTE — Patient Instructions (Signed)
Description   Skip today's dosage of Coumadin, then start taking 1 tablet daily except 1.5 tablets on Fridays. Recheck INR in 2 weeks. Call with any questions new medications or if scheduled for any other  procedures [phone]

## 2018-06-19 ENCOUNTER — Telehealth: Payer: Self-pay | Admitting: *Deleted

## 2018-06-19 ENCOUNTER — Other Ambulatory Visit: Payer: Self-pay | Admitting: Oncology

## 2018-06-19 DIAGNOSIS — C187 Malignant neoplasm of sigmoid colon: Secondary | ICD-10-CM

## 2018-06-19 NOTE — Telephone Encounter (Signed)
-----   Message from Ladell Pier, MD sent at 06/19/2018  4:01 PM EST ----- His case was presented at the GI tumor conference.  The consensus recommendation is to proceed with a PET scan to further evaluate the slightly enlarged left upper lung nodule  Please schedule a PET scan within the next 2 weeks and refer to thoracic surgery if [name] is in agreement  Danna can help facilitate the thoracic surgery appointment  Thanks

## 2018-06-19 NOTE — Telephone Encounter (Signed)
Left VM for patient to return call to nurse tomorrow. RN will also attempt to reach him again tomorrow.

## 2018-06-20 NOTE — Telephone Encounter (Signed)
Attempted to call patient X 2 today without success. Only got his voice mail.

## 2018-06-21 ENCOUNTER — Telehealth: Payer: Self-pay | Admitting: *Deleted

## 2018-06-21 ENCOUNTER — Encounter: Payer: Self-pay | Admitting: *Deleted

## 2018-06-21 DIAGNOSIS — R911 Solitary pulmonary nodule: Secondary | ICD-10-CM

## 2018-06-21 NOTE — Progress Notes (Signed)
Oncology Nurse Navigator Documentation  Oncology Nurse Navigator Flowsheets 06/21/2018  Navigator Location [facility]  Navigator Encounter Type Other/I received notification from Dr. Benay Spice that Mr. Mccauley needs to be seen by T surgery after PET scan.  His PET scan is scheduled for 06/26/18 and referral completed for T surgery.  I will also update T surgery office on referral.   Treatment Phase Abnormal Scans  Barriers/Navigation Needs Coordination of Care  Interventions Coordination of Care  Coordination of Care Other  Acuity Level 2  Time Spent with Patient 30

## 2018-06-21 NOTE — Telephone Encounter (Signed)
Was finally able to speak with patient regarding GI conference recommendations for PET scan. He agrees to proceed and confirms the appointment for 06/26/18. He also agrees to see thoracic surgery after PET scan results are available. Sent message to thoracic navigator, Norton Blizzard. Patient states OK to leave detailed message on his voice mail.

## 2018-06-26 ENCOUNTER — Ambulatory Visit (HOSPITAL_COMMUNITY)
Admission: RE | Admit: 2018-06-26 | Discharge: 2018-06-26 | Disposition: A | Discharge status: Home / Self Care | Payer: Medicare Other | Source: Ambulatory Visit | Attending: Oncology | Admitting: Oncology

## 2018-06-26 DIAGNOSIS — C187 Malignant neoplasm of sigmoid colon: Secondary | ICD-10-CM | POA: Diagnosis not present

## 2018-06-26 LAB — GLUCOSE, CAPILLARY: GLUCOSE-CAPILLARY: 99 mg/dL (ref 70–99)

## 2018-06-26 MED ORDER — FLUDEOXYGLUCOSE F - 18 (FDG) INJECTION
9.6100 | Freq: Once | INTRAVENOUS | Status: AC
  Administered 2018-06-26: 9.61 via INTRAVENOUS

## 2018-06-27 ENCOUNTER — Other Ambulatory Visit: Payer: Self-pay

## 2018-06-27 ENCOUNTER — Institutional Professional Consult (permissible substitution) (INDEPENDENT_AMBULATORY_CARE_PROVIDER_SITE_OTHER): Payer: Medicare Other | Admitting: Cardiothoracic Surgery

## 2018-06-27 ENCOUNTER — Encounter: Payer: Self-pay | Admitting: Cardiothoracic Surgery

## 2018-06-27 ENCOUNTER — Other Ambulatory Visit: Payer: Self-pay | Admitting: *Deleted

## 2018-06-27 ENCOUNTER — Ambulatory Visit (INDEPENDENT_AMBULATORY_CARE_PROVIDER_SITE_OTHER): Payer: Medicare Other | Admitting: Pharmacist

## 2018-06-27 VITALS — BP 140/68 | HR 61 | Resp 20 | Ht 71.0 in | Wt 173.2 lb

## 2018-06-27 DIAGNOSIS — I255 Ischemic cardiomyopathy: Secondary | ICD-10-CM | POA: Diagnosis not present

## 2018-06-27 DIAGNOSIS — Z7901 Long term (current) use of anticoagulants: Secondary | ICD-10-CM

## 2018-06-27 DIAGNOSIS — I480 Paroxysmal atrial fibrillation: Secondary | ICD-10-CM

## 2018-06-27 DIAGNOSIS — R911 Solitary pulmonary nodule: Secondary | ICD-10-CM

## 2018-06-27 LAB — POCT INR: INR: 3.2 — AB (ref 2.0–3.0)

## 2018-06-27 NOTE — Patient Instructions (Signed)
Description   Take 1/2 tablet today, then continue taking 1 tablet daily except 1.5 tablets on Fridays. Recheck INR in 2-3 weeks. Call with any questions new medications or if scheduled for any other  procedures [phone]

## 2018-06-27 NOTE — Progress Notes (Signed)
[address]       [place] [address]             [place] Record #[id number] Date of Birth: [birth year]  Referring: Ladell Pier, MD Primary Care: Marton Redwood, MD Primary Cardiologist: Lauree Chandler, MD  Chief Complaint:    Chief Complaint  Patient presents with  . Lung Lesion    new patient consultation PET 06/26/18, Chest CT 06/03/18    History of Present Illness:    [name] 84 y.o. male is seen in the office  today for evaluation of left lung, noted on ct of chest, pet scan done today. Patient has history of cad, recent coronary sten placement and afib.    Current Activity/ Functional Status:  Patient is independent with mobility/ambulation, transfers, ADL's, IADL's.   Zubrod Score: At the time of surgery this patient's most appropriate activity status/level should be described as: []     0    Normal activity, no symptoms [x]     1    Restricted in physical strenuous activity but ambulatory, able to do out light work []     2    Ambulatory and capable of self care, unable to do work activities, up and about               >50 % of waking hours                              []     3    Only limited self care, in bed greater than 50% of waking hours []     4    Completely disabled, no self care, confined to bed or chair []     5    Moribund   Past Medical History:  Diagnosis Date  . A-fib ([place])   . Arthritis   . BPH (benign prostatic hyperplasia)   . Chest pain, unspecified   . Chronic kidney disease   . Coronary atherosclerosis of unspecified type of vessel, native or graft   . Dizziness and giddiness   . Hyperlipidemia   . Hypertension   . Myocardial infarction ([place])    x2  . Non-ST elevation (NSTEMI) myocardial infarction ([place])   . Peripheral neuropathy   . PVD (peripheral vascular disease) ([place])   . Shortness of breath    exertion  . Vitamin B12 deficiency      Past Surgical History:  Procedure Laterality Date  . COLON SURGERY     partial colectomy Dr. Marcello Moores 12-14-17  . CORONARY ANGIOPLASTY     4 stents  . CORONARY STENT INTERVENTION N/A 08/09/2017   Procedure: CORONARY STENT INTERVENTION;  Surgeon: Nelva Bush, MD;  Location: [facility];  Service: Cardiovascular;  Laterality: N/A;  . FLEXIBLE SIGMOIDOSCOPY N/A 11/12/2017   Procedure: FLEXIBLE SIGMOIDOSCOPY;  Surgeon: Irene Shipper, MD;  Location: [facility];  Service: Endoscopy;  Laterality: N/A;  . HERNIA REPAIR     right inguinal  . INGUINAL HERNIA REPAIR  06/14/2012   Procedure: HERNIA REPAIR INGUINAL ADULT;  Surgeon: Odis Hollingshead, MD;  Location: [facility];  Service: General;  Laterality: Right;  . INSERTION OF MESH  06/14/2012   Procedure: INSERTION OF MESH;  Surgeon: Odis Hollingshead, MD;  Location: [facility];  Service: General;  Laterality: Right;  . LAPAROSCOPIC PARTIAL COLECTOMY N/A 12/26/2017   Procedure: LAPAROSCOPIC PARTIAL COLECTOMY ERAS PATHWAY;  Surgeon: Leighton Ruff, MD;  Location: [facility];  Service: General;  Laterality: N/A;  . LEFT HEART CATH AND CORONARY ANGIOGRAPHY N/A 08/09/2017   Procedure: LEFT HEART CATH AND CORONARY ANGIOGRAPHY;  Surgeon: Nelva Bush, MD;  Location: [facility];  Service: Cardiovascular;  Laterality: N/A;  . PROCTOSCOPY N/A 12/26/2017   Procedure: PROCTOSCOPY;  Surgeon: Leighton Ruff, MD;  Location: [facility];  Service: General;  Laterality: N/A;  . SINUS SURGERY WITH INSTATRAK  1985    Family History  Problem Relation Age of Onset  . Heart attack Father 38  . Arthritis Sister   . Hypothyroidism Daughter      Social History   Tobacco Use  Smoking Status Former Smoker  . Last attempt to quit: 07/10/1981  . Years since quitting: 36.9  Smokeless Tobacco Never Used    Social History   Substance and Sexual Activity  Alcohol Use No     Allergies  Allergen Reactions  . Gadolinium Derivatives Hives, Itching and Other  (See Comments)    Dr. Jeralyn Ruths s/w [name]. We observed him for 15 minutes. He did not need to take Benadryl. The symptoms began to subside before he left.     Current Outpatient Medications  Medication Sig Dispense Refill  . atorvastatin (LIPITOR) 40 MG tablet TAKE ONE TABLET DAILY AT 6PM 90 tablet 2  . clopidogrel (PLAVIX) 75 MG tablet TAKE ONE TABLET EACH DAY (Patient taking differently: Take 75 mg by mouth daily. ) 90 tablet 3  . lisinopril (PRINIVIL,ZESTRIL) 5 MG tablet Take 0.5 tablets (2.5 mg total) by mouth daily. 45 tablet   . metoprolol succinate (TOPROL-XL) 25 MG 24 hr tablet Take 3 tablets (75 mg total) by mouth daily. 90 tablet 9  . vitamin B-12 (CYANOCOBALAMIN) 1000 MCG tablet Take 1,000 mcg by mouth daily.    Marland Kitchen warfarin (COUMADIN) 5 MG tablet TAKE AS DIRECTED BY COUMADIN CLINIC 30 tablet 2   No current facility-administered medications for this visit.     Pertinent items are noted in HPI.   Review of Systems:     Cardiac Review of Systems: [Y] = yes  or   [ N ] = no   Chest Pain [ n   ]  Resting SOB [ n   ] Exertional SOB  Blue.Reese  ]  Orthopnea [ n ]   Pedal Edema [ n  ]    Palpitations [ y ] Syncope  [n  ]   Presyncope [  n ]   General Review of Systems: [Y] = yes [  ]=no Constitional: recent weight change [  ];  Wt loss over the last 3 months [   ] anorexia [  ]; fatigue [  ]; nausea [  ]; night sweats [  ]; fever [  ]; or chills [  ];           Eye : blurred vision [  ]; diplopia [   ]; vision changes [  ];  Amaurosis fugax[  ]; Resp: cough [  ];  wheezing[  ];  hemoptysis[  ]; shortness of breath[  ]; paroxysmal nocturnal dyspnea[  ]; dyspnea on exertion[  ]; or orthopnea[  ];  GI:  gallstones[  ], vomiting[  ];  dysphagia[  ]; melena[  ];  hematochezia [  ]; heartburn[  ];   Hx of  Colonoscopy[  ]; GU: kidney stones [  ]; hematuria[  ];   dysuria [  ];  nocturia[  ];  history of     obstruction [  ]; urinary frequency [ y ]             Skin: rash, swelling[  ];, hair  loss[  ];  peripheral edema[  ];  or itching[  ]; Musculosketetal: myalgias[  ];  joint swelling[  ];  joint erythema[  ];  joint pain[y  ];  back pain[  ];  Heme/Lymph: bruising[  ];  bleeding[  ];  anemia[  ];  Neuro: TIA[  ];  headaches[  ];  stroke[  ];  vertigo[  ];  seizures[  ];   paresthesias[  ];  difficulty walking[  ];  Psych:depression[  ]; anxiety[  ];  Endocrine: diabetes[  ];  thyroid dysfunction[  ];  Immunizations: Flu up to date [ n ]; Pneumococcal up to date Florencio.Farrier  ];  Other:     PHYSICAL EXAMINATION: BP 140/68 (BP Location: Right Arm, Patient Position: Sitting, Cuff Size: Normal)   Pulse 61   Resp 20   Ht 5\' 11"  (1.803 m)   Wt 173 lb 3.2 oz (78.6 kg)   SpO2 97% Comment: RA  BMI 24.16 kg/m  General appearance: alert, cooperative, appears stated age and no distress Head: Normocephalic, without obvious abnormality, atraumatic Neck: no adenopathy, no carotid bruit, no JVD, supple, symmetrical, trachea midline and thyroid not enlarged, symmetric, no tenderness/mass/nodules Lymph nodes: Cervical, supraclavicular, and axillary nodes normal. Resp: clear to auscultation bilaterally Cardio: irregularly irregular rhythm GI: soft, non-tender; bowel sounds normal; no masses,  no organomegaly Extremities: extremities normal, atraumatic, no cyanosis or edema and Homans sign is negative, no sign of DVT Neurologic: Grossly normal  Diagnostic Studies & Laboratory data:     Recent Radiology Findings:   Ct Chest Wo Contrast  Result Date: 06/03/2018 CLINICAL DATA:  History of colon cancer. Follow-up pulmonary nodule. EXAM: CT CHEST WITHOUT CONTRAST TECHNIQUE: Multidetector CT imaging of the chest was performed following the standard protocol without IV contrast. COMPARISON:  11/20/2017 FINDINGS: Cardiovascular: The heart size appears within normal limits. No pericardial effusion. Aortic atherosclerosis. Calcification within the left main, lad, left circumflex and RCA coronary  arteries noted. Mediastinum/Nodes: Normal appearance of the thyroid gland. The trachea appears patent and is midline. Normal appearance of the esophagus. No axillary or supraclavicular adenopathy. No mediastinal or hilar adenopathy identified. Lungs/Pleura: Moderate changes of emphysema. No airspace consolidation, atelectasis or pneumothorax. Again seen is a part solid nodule within the anterolateral left upper lobe. On today's exam this measures 2.1 by 1.4 cm and has a central solid component measuring 1.3 cm, image 71/7. Previously this measured 1.9 x 1.3 cm with a central solid component measuring 0.5 cm. Pure ground-glass attenuating nodule within the superior segment of left lower lobe measures 1.4 cm, image 37/7. Stable Upper Abdomen: No acute abnormality within the abdomen. Aortic atherosclerosis. Musculoskeletal: Spondylosis within the thoracic spine. No aggressive lytic or sclerotic bone lesions. IMPRESSION: 1. There is been interval increase in size of the part solid nodule within the left upper lobe. The central solid component now measures 1.3 cm. Adenocarcinoma is considered. Thoracic surgery consultation is recommended. PET/CT should be considered for staging purposes. These recommendations are taken from: Recommendations for the Management of Subsolid Pulmonary Nodules Detected at CT: A Statement from the [facility] 2013; 266:1, 304-317. 2. Stable pure ground-glass attenuating nodule within the  superior segment of left lower lobe measuring 1.4 cm. Adenocarcinoma cannot be excluded. Follow up by CT is recommended in 12 months, with continued annual surveillance for a minimum of 3 years. These recommendations are taken from: Recommendations for the Management of Subsolid Pulmonary Nodules Detected at CT: A Statement from the [facility] 2013; 266:1, 304-317. 3. Aortic Atherosclerosis (ICD10-I70.0) and Emphysema (ICD10-J43.9). 4. Coronary artery atherosclerotic  calcifications. Electronically Signed   By: Kerby Moors M.D.   On: 06/03/2018 16:53   Nm Pet Image Initial (pi) Skull Base To Thigh  Result Date: 06/26/2018 CLINICAL DATA:  Initial treatment strategy for sigmoid colon carcinoma. EXAM: NUCLEAR MEDICINE PET SKULL BASE TO THIGH TECHNIQUE: 9.6 mCi F-18 FDG was injected intravenously. Full-ring PET imaging was performed from the skull base to thigh after the radiotracer. CT data was obtained and used for attenuation correction and anatomic localization. Fasting blood glucose: 99 mg/dl COMPARISON:  None. FINDINGS: Mediastinal blood pool activity: SUV max 1.81 NECK: No hypermetabolic lymph nodes in the neck. Incidental CT findings: none CHEST: Within the LEFT upper lobe solid and sub solid nodule is again noted measuring up to 20 mm including the sub solid portion. This central solid portion measures 11 mm and has associated metabolic activity (SUV max equal 4.5). No additional hypermetabolic pulmonary nodules. No hypermetabolic mediastinal lymph nodes. Incidental CT findings: Coronary artery calcification and aortic atherosclerotic calcification. ABDOMEN/PELVIS: There is intense circumferential metabolic activity involving the gastric fundus, gastric body and extending to the gastric antrum. Activity is intense with SUV max equal 14.2. There is no mass lesion identified on CT portion of exam. There is mild activity through the distal esophagus from the level the carina to the GE junction. No abnormal activity in the liver. No hypermetabolic abdominopelvic lymph nodes. Symmetric activity in the adrenal glands noted. Prostate is enlarged. Anastomosis in the sigmoid colon noted. No evidence obstruction no metabolic at nodularity. No hypermetabolic pelvic lymph nodes. Incidental CT findings: none SKELETON: No focal hypermetabolic activity to suggest skeletal metastasis. Incidental CT findings: none IMPRESSION: 1. Hypermetabolic LEFT upper lobe pulmonary nodule. The  solid portion of this nodule is increased over time. Findings concerning for BRONCHOGENIC CARCINOMA. 2. Intense radiotracer activity within the gastric fundus and body which is greater than normal physiologic activity. Differential would include gastritis versus lymphoma or primary gastric malignancy. As there is no mass lesion identified, malignancy is less favored. Recommend upper endoscopy and potentially tissue sampling. 3. Diffuse activity associated the distal esophagus may relate to abnormal hypermetabolic stomach. 4. No evidence of local colorectal carcinoma at the sigmoid anastomosis. 5. No evidence of metastatic disease in the abdomen pelvis. Electronically Signed   By: Suzy Bouchard M.D.   On: 06/26/2018 17:10     I have independently reviewed the above radiology studies  and reviewed the findings with the patient.   Recent Lab Findings: Lab Results  Component Value Date   WBC 8.6 12/29/2017   HGB 11.5 (L) 12/29/2017   HCT 35.6 (L) 12/29/2017   PLT 168 12/29/2017   GLUCOSE 92 03/27/2018   CHOL 160 08/09/2017   TRIG 74 08/09/2017   HDL 41 08/09/2017   LDLCALC 104 (H) 08/09/2017   ALT 12 (L) 11/14/2016   AST 17 11/14/2016   NA 143 03/27/2018   K 4.2 03/27/2018   CL 103 03/27/2018   CREATININE 1.26 03/27/2018   BUN 25 03/27/2018   CO2 26 03/27/2018   TSH 0.926 08/08/2017   INR 3.2 (A) 06/27/2018  HGBA1C 6.0 02/14/2011   Chronic Kidney Disease   Stage I     GFR >90  Stage II    GFR 60-89  Stage IIIA GFR 45-59  Stage IIIB GFR 30-44  Stage IV   GFR 15-29  Stage V    GFR  <15  Lab Results  Component Value Date   CREATININE 1.26 03/27/2018   CrCl cannot be calculated (Patient's most recent lab result is older than the maximum 21 days allowed.).   Assessment / Plan:   1. Sigmoid colon cancer, stage II (T3N0), status post a sigmoidectomy 12/26/2017 Coronary stents February 2019 placed Anticoagulated on Coumadin and Plavix 2. Coronary artery disease 3. Atrial  fibrillation 4. BPH 5.   Hypermetabolic LEFT upper lobe pulmonary nodule. The solid portion of this nodule is increased over time. Findings concerning for BRONCHOGENIC CARCINOMA. I have reviewed the ct and pet findinds with the patient . With his medical problems  Including cad,afib I have recommende that we proceed with ct guided needle bx of the left lung lesion and depending on findings consider stereotactic radiation therapy. I will plan to see back after bx and discuss treatment options.  I  spent 45 minutes with  the patient face to face and greater then 50% of the time was spent in counseling and coordination of care.    Grace Isaac MD      [address] [place] [address] Office [phone]   Beeper [phone]  06/27/2018 2:32 PM

## 2018-06-28 ENCOUNTER — Other Ambulatory Visit: Payer: Self-pay | Admitting: Cardiovascular Disease

## 2018-07-04 ENCOUNTER — Other Ambulatory Visit: Payer: Self-pay | Admitting: Oncology

## 2018-07-04 DIAGNOSIS — C187 Malignant neoplasm of sigmoid colon: Secondary | ICD-10-CM

## 2018-07-08 ENCOUNTER — Telehealth: Payer: Self-pay | Admitting: *Deleted

## 2018-07-08 ENCOUNTER — Other Ambulatory Visit: Payer: Self-pay | Admitting: Radiology

## 2018-07-08 ENCOUNTER — Telehealth: Payer: Self-pay

## 2018-07-08 NOTE — Telephone Encounter (Signed)
Confirmed w/daughter that she was called by both Ashely and Ryan and clearly understands the directions.

## 2018-07-08 NOTE — Telephone Encounter (Signed)
Daughter called to obtain information on the biopsy tomorrow. Needs specifics on arrival time,prep, how long it will take and what is involved in the biopsy.  Noted it was scheduled at [facility] and was transferred to scheduling Gildardo Pounds). Left VM requesting she call this RN or the daughter at contact # provided. Also left VM with scheduling supervisor at Dr. Everrett Coombe office Thurmond Butts) at [phone] to call this RN or daughter.

## 2018-07-08 NOTE — Telephone Encounter (Signed)
-----   Message from Irene Shipper, MD sent at 07/08/2018  2:26 PM EST ----- Regarding: needs EGD [name], we will set him up for EGD.  Thanks Vaughan Basta, please set this patient up for EGD in the [facility] next available or at his nearest convenience.  Thanks, Dr. Henrene Pastor ----- Message ----- From: Ladell Pier, MD Sent: 07/04/2018   8:20 AM EST To: Irene Shipper, MD  [name] has a history of stage II colon cancerHe underwent a staging PET scan to evaluate a left lung nodule.  The PET scan shows an area of intense uptake in the stomach  He is scheduled for a biopsy of the lung nodule on 07/09/2018  Will you look at the PET scan and see if you agree with an upper endoscopy?  This could be performed anytime within the next several weeks  Thanks,   [name]

## 2018-07-08 NOTE — Telephone Encounter (Signed)
Left message for pt to call back regarding scheduling EGD.

## 2018-07-09 ENCOUNTER — Other Ambulatory Visit: Payer: Self-pay

## 2018-07-09 ENCOUNTER — Telehealth: Payer: Self-pay | Admitting: Nurse Practitioner

## 2018-07-09 ENCOUNTER — Inpatient Hospital Stay (HOSPITAL_COMMUNITY)
Admission: RE | Admit: 2018-07-09 | Discharge: 2018-07-16 | DRG: 199 | Disposition: A | Discharge status: Home Health | Payer: Medicare Other | Attending: Internal Medicine | Admitting: Internal Medicine

## 2018-07-09 ENCOUNTER — Other Ambulatory Visit (HOSPITAL_COMMUNITY): Payer: Self-pay | Admitting: Cardiothoracic Surgery

## 2018-07-09 ENCOUNTER — Observation Stay (HOSPITAL_COMMUNITY): Payer: Medicare Other

## 2018-07-09 ENCOUNTER — Encounter (HOSPITAL_COMMUNITY): Payer: Self-pay

## 2018-07-09 DIAGNOSIS — J95811 Postprocedural pneumothorax: Principal | ICD-10-CM | POA: Diagnosis present

## 2018-07-09 DIAGNOSIS — J939 Pneumothorax, unspecified: Secondary | ICD-10-CM | POA: Diagnosis not present

## 2018-07-09 DIAGNOSIS — N179 Acute kidney failure, unspecified: Secondary | ICD-10-CM | POA: Diagnosis not present

## 2018-07-09 DIAGNOSIS — Z85038 Personal history of other malignant neoplasm of large intestine: Secondary | ICD-10-CM

## 2018-07-09 DIAGNOSIS — Z87891 Personal history of nicotine dependence: Secondary | ICD-10-CM

## 2018-07-09 DIAGNOSIS — I129 Hypertensive chronic kidney disease with stage 1 through stage 4 chronic kidney disease, or unspecified chronic kidney disease: Secondary | ICD-10-CM | POA: Diagnosis present

## 2018-07-09 DIAGNOSIS — Z9049 Acquired absence of other specified parts of digestive tract: Secondary | ICD-10-CM

## 2018-07-09 DIAGNOSIS — C3412 Malignant neoplasm of upper lobe, left bronchus or lung: Secondary | ICD-10-CM | POA: Diagnosis not present

## 2018-07-09 DIAGNOSIS — Z66 Do not resuscitate: Secondary | ICD-10-CM | POA: Diagnosis present

## 2018-07-09 DIAGNOSIS — T797XXA Traumatic subcutaneous emphysema, initial encounter: Secondary | ICD-10-CM | POA: Diagnosis not present

## 2018-07-09 DIAGNOSIS — K219 Gastro-esophageal reflux disease without esophagitis: Secondary | ICD-10-CM | POA: Diagnosis present

## 2018-07-09 DIAGNOSIS — Z4682 Encounter for fitting and adjustment of non-vascular catheter: Secondary | ICD-10-CM | POA: Diagnosis not present

## 2018-07-09 DIAGNOSIS — E785 Hyperlipidemia, unspecified: Secondary | ICD-10-CM | POA: Diagnosis not present

## 2018-07-09 DIAGNOSIS — J9601 Acute respiratory failure with hypoxia: Secondary | ICD-10-CM | POA: Diagnosis not present

## 2018-07-09 DIAGNOSIS — N400 Enlarged prostate without lower urinary tract symptoms: Secondary | ICD-10-CM | POA: Diagnosis present

## 2018-07-09 DIAGNOSIS — N183 Chronic kidney disease, stage 3 (moderate): Secondary | ICD-10-CM | POA: Diagnosis present

## 2018-07-09 DIAGNOSIS — R911 Solitary pulmonary nodule: Secondary | ICD-10-CM | POA: Diagnosis not present

## 2018-07-09 DIAGNOSIS — I739 Peripheral vascular disease, unspecified: Secondary | ICD-10-CM | POA: Diagnosis present

## 2018-07-09 DIAGNOSIS — I4891 Unspecified atrial fibrillation: Secondary | ICD-10-CM | POA: Diagnosis present

## 2018-07-09 DIAGNOSIS — Y848 Other medical procedures as the cause of abnormal reaction of the patient, or of later complication, without mention of misadventure at the time of the procedure: Secondary | ICD-10-CM | POA: Diagnosis present

## 2018-07-09 DIAGNOSIS — C187 Malignant neoplasm of sigmoid colon: Secondary | ICD-10-CM | POA: Diagnosis present

## 2018-07-09 DIAGNOSIS — I480 Paroxysmal atrial fibrillation: Secondary | ICD-10-CM | POA: Diagnosis present

## 2018-07-09 DIAGNOSIS — Z9689 Presence of other specified functional implants: Secondary | ICD-10-CM

## 2018-07-09 DIAGNOSIS — Z7902 Long term (current) use of antithrombotics/antiplatelets: Secondary | ICD-10-CM

## 2018-07-09 DIAGNOSIS — I10 Essential (primary) hypertension: Secondary | ICD-10-CM | POA: Diagnosis present

## 2018-07-09 DIAGNOSIS — Z7901 Long term (current) use of anticoagulants: Secondary | ICD-10-CM

## 2018-07-09 DIAGNOSIS — J9383 Other pneumothorax: Secondary | ICD-10-CM | POA: Diagnosis not present

## 2018-07-09 DIAGNOSIS — J9382 Other air leak: Secondary | ICD-10-CM | POA: Diagnosis not present

## 2018-07-09 DIAGNOSIS — I255 Ischemic cardiomyopathy: Secondary | ICD-10-CM | POA: Diagnosis present

## 2018-07-09 DIAGNOSIS — I252 Old myocardial infarction: Secondary | ICD-10-CM

## 2018-07-09 DIAGNOSIS — I2510 Atherosclerotic heart disease of native coronary artery without angina pectoris: Secondary | ICD-10-CM | POA: Diagnosis present

## 2018-07-09 DIAGNOSIS — Z79899 Other long term (current) drug therapy: Secondary | ICD-10-CM

## 2018-07-09 DIAGNOSIS — Z955 Presence of coronary angioplasty implant and graft: Secondary | ICD-10-CM

## 2018-07-09 LAB — CBC
HCT: 43.2 % (ref 39.0–52.0)
Hemoglobin: 13.5 g/dL (ref 13.0–17.0)
MCH: 27.7 pg (ref 26.0–34.0)
MCHC: 31.3 g/dL (ref 30.0–36.0)
MCV: 88.7 fL (ref 80.0–100.0)
Platelets: 231 10*3/uL (ref 150–400)
RBC: 4.87 MIL/uL (ref 4.22–5.81)
RDW: 13.2 % (ref 11.5–15.5)
WBC: 8.4 10*3/uL (ref 4.0–10.5)
nRBC: 0 % (ref 0.0–0.2)

## 2018-07-09 LAB — PROTIME-INR
INR: 1.17
Prothrombin Time: 14.8 seconds (ref 11.4–15.2)

## 2018-07-09 LAB — BASIC METABOLIC PANEL
ANION GAP: 10 (ref 5–15)
BUN: 18 mg/dL (ref 8–23)
CO2: 26 mmol/L (ref 22–32)
Calcium: 9 mg/dL (ref 8.9–10.3)
Chloride: 103 mmol/L (ref 98–111)
Creatinine, Ser: 1.12 mg/dL (ref 0.61–1.24)
GFR calc Af Amer: >60 mL/min (ref >60)
GFR calc non Af Amer: 59 mL/min — ABNORMAL LOW (ref >60)
Glucose, Bld: 148 mg/dL — ABNORMAL HIGH (ref 70–99)
Potassium: 4.3 mmol/L (ref 3.5–5.1)
SODIUM: 139 mmol/L (ref 135–145)

## 2018-07-09 MED ORDER — DOCUSATE SODIUM 100 MG PO CAPS
100.0000 mg | ORAL_CAPSULE | Freq: Two times a day (BID) | ORAL | Status: DC
  Administered 2018-07-10 – 2018-07-16 (×12): 100 mg via ORAL
  Filled 2018-07-09 (×14): qty 1

## 2018-07-09 MED ORDER — ONDANSETRON HCL 4 MG/2ML IJ SOLN: 4.0000 mg | Freq: Four times a day (QID) | INTRAMUSCULAR | Status: DC | PRN

## 2018-07-09 MED ORDER — PANTOPRAZOLE SODIUM 40 MG PO TBEC
40.0000 mg | DELAYED_RELEASE_TABLET | Freq: Two times a day (BID) | ORAL | Status: DC
  Administered 2018-07-09 – 2018-07-16 (×14): 40 mg via ORAL
  Filled 2018-07-09 (×14): qty 1

## 2018-07-09 MED ORDER — ONDANSETRON HCL 4 MG PO TABS: 4.0000 mg | ORAL_TABLET | Freq: Four times a day (QID) | ORAL | Status: DC | PRN

## 2018-07-09 MED ORDER — ENOXAPARIN SODIUM 40 MG/0.4ML ~~LOC~~ SOLN
40.0000 mg | SUBCUTANEOUS | Status: DC
  Administered 2018-07-09: 40 mg via SUBCUTANEOUS
  Filled 2018-07-09: qty 0.4

## 2018-07-09 MED ORDER — ACETAMINOPHEN 325 MG PO TABS
650.0000 mg | ORAL_TABLET | Freq: Four times a day (QID) | ORAL | Status: DC | PRN
  Administered 2018-07-12 – 2018-07-15 (×4): 650 mg via ORAL
  Filled 2018-07-09 (×5): qty 2

## 2018-07-09 MED ORDER — FENTANYL CITRATE (PF) 100 MCG/2ML IJ SOLN
INTRAMUSCULAR | Status: AC | PRN
  Administered 2018-07-09 (×2): 25 ug via INTRAVENOUS

## 2018-07-09 MED ORDER — ATORVASTATIN CALCIUM 40 MG PO TABS
40.0000 mg | ORAL_TABLET | Freq: Every day | ORAL | Status: DC
  Administered 2018-07-10 – 2018-07-16 (×7): 40 mg via ORAL
  Filled 2018-07-09 (×7): qty 1

## 2018-07-09 MED ORDER — LISINOPRIL 2.5 MG PO TABS
2.5000 mg | ORAL_TABLET | Freq: Every day | ORAL | Status: DC
  Administered 2018-07-10 – 2018-07-16 (×6): 2.5 mg via ORAL
  Filled 2018-07-09 (×7): qty 1

## 2018-07-09 MED ORDER — METOPROLOL SUCCINATE ER 50 MG PO TB24
75.0000 mg | ORAL_TABLET | Freq: Every day | ORAL | Status: DC
  Administered 2018-07-10 – 2018-07-16 (×7): 75 mg via ORAL
  Filled 2018-07-09 (×8): qty 1

## 2018-07-09 MED ORDER — MIDAZOLAM HCL 2 MG/2ML IJ SOLN
INTRAMUSCULAR | Status: AC | PRN
  Administered 2018-07-09: 0.5 mg via INTRAVENOUS
  Administered 2018-07-09: 1 mg via INTRAVENOUS

## 2018-07-09 MED ORDER — MORPHINE SULFATE (PF) 2 MG/ML IV SOLN
2.0000 mg | INTRAVENOUS | Status: DC | PRN
  Administered 2018-07-09 – 2018-07-14 (×11): 2 mg via INTRAVENOUS
  Filled 2018-07-09 (×11): qty 1

## 2018-07-09 MED ORDER — FENTANYL CITRATE (PF) 100 MCG/2ML IJ SOLN
INTRAMUSCULAR | Status: AC
  Filled 2018-07-09: qty 2

## 2018-07-09 MED ORDER — ACETAMINOPHEN 650 MG RE SUPP: 650.0000 mg | Freq: Four times a day (QID) | RECTAL | Status: DC | PRN

## 2018-07-09 MED ORDER — MIDAZOLAM HCL 2 MG/2ML IJ SOLN
INTRAMUSCULAR | Status: AC
  Filled 2018-07-09: qty 2

## 2018-07-09 MED ORDER — SODIUM CHLORIDE 0.9 % IV SOLN: INTRAVENOUS | Status: DC

## 2018-07-09 MED ORDER — LIDOCAINE HCL 1 % IJ SOLN
INTRAMUSCULAR | Status: AC
  Filled 2018-07-09: qty 20

## 2018-07-09 NOTE — Telephone Encounter (Signed)
Called and spoke with [name] at Dr. Cristy Friedlander office and pharmacy is working on coumadin instructions. Holding the plavix will have to be reviewed by PA in the office. Once reviewed instructions will be faxed over to the office.

## 2018-07-09 NOTE — Sedation Documentation (Signed)
Patient taken back to nurses station for further monitoring.

## 2018-07-09 NOTE — Telephone Encounter (Signed)
I do not see that we ever received a clearance request for pt to hold his warfarin or his Plavix for a lung biopsy, or for an endoscopy.  He takes warfarin for afib with CHADS2VASc score of 4 (age x2, HTN, CAD). He would be ok to hold his warfarin for 5 days prior to procedure, I do not know how long he has been holding his warfarin already for his biopsy as we were not aware of this and pt did not inform us when he came in for his INR check on 12/19.  He should be ok to hold his warfarin for an extra 2 days for endoscopy on 1/2, however he should resume his warfarin that night and take an extra 1/2 tablet for 2 days (total of 1.5 tablets of warfarin on 07/11/18 and total of 2 tablets on 07/12/18). Then resume normal dose and keep f/u in Coumadin clinic on 1/7 for next INR check.

## 2018-07-09 NOTE — Progress Notes (Signed)
Notified Dr. Anselm Pancoast that [name] has a small air leak and that patient was placed on 2L O2 [place] because his O2 sats were between 88-91. On 2L , pt is satting 94-96. I gave patient IS and explained how to use it . Pt stated he had used one before ,but he wound not use it now because it would hurt. I offered the patient pain med,but he refused it at this time. I encouraged him to ask for it before his pain got much worse. He stated he would.

## 2018-07-09 NOTE — Sedation Documentation (Signed)
Per bed placement, patient will be admitted to 6N15.

## 2018-07-09 NOTE — Sedation Documentation (Signed)
Pt is stable at this time with no complaints, daughter at bedside. Dr Anselm Pancoast in to speak with pt and pt daughter. Awaiting bed placement. VSS. Dressing is CDI.

## 2018-07-09 NOTE — Progress Notes (Signed)
Pt admitted to 6N15 from PACU s/p lung biopsy and chest tube placement.  CT to 20 cm suction.

## 2018-07-09 NOTE — Telephone Encounter (Signed)
Received call from [place] at [facility] that patient is in need of endoscopy which is tentatively scheduled for 1/2. She states patient is off Plavix and Coumadin for a lung biopsy today and they want to know if patient may continue to stay off Plavix and Coumadin in preparation for EGD on Thursday. I advised that I will need to forward the message to our Pharm D for advice on Coumadin. I advised that I do not see documentation in patient's chart regarding a request to hold Plavix. Vaughan Basta states that she does not see it either and that she will call patient's daughter to find out who gave the approval.  Vaughan Basta called back and advised that daughter is uncertain as to who advised patient to hold Plavix. She states patient thinks it may have been Dr. Benay Spice. Vaughan Basta states daughter states she will page Dr. Angelena Form to ask him. I advised that I will forward request to Pharm D for advice regarding Coumadin. Vaughan Basta thanked me for my help.

## 2018-07-09 NOTE — Sedation Documentation (Signed)
Patient comfortable and stable at this time. Family at bedside. Vss. Patient updated on waiting for bed. A&Ox4.

## 2018-07-09 NOTE — Telephone Encounter (Signed)
Pt developed a pneumothorax after lung biopsy today and is being admitted to the hospital. Pt will need to wait a few weeks to schedule the EGD. Pts daughter will call back when they are ready to schedule the EGD.

## 2018-07-09 NOTE — H&P (Signed)
Chief Complaint: Patient was seen in consultation today for left lung nodule biopsy at the request of Gerhardt,Edward B  Referring Physician(s): Grace Isaac  Supervising Physician: Markus Daft  Patient Status: [facility] - Out-pt  History of Present Illness: [name] is a 84 y.o. male   Hx Colon Ca 12/2017 Known small LUL nodule Enlarging per imaging   CAD-- stents placed 08/2017: on Plavix/Coumadin LD Coumadin 07/05/18 LD  Plavix 07/03/18  PET: 06/26/18:  IMPRESSION: 1. Hypermetabolic LEFT upper lobe pulmonary nodule. The solid portion of this nodule is increased over time. Findings concerning for BRONCHOGENIC CARCINOMA. 2. Intense radiotracer activity within the gastric fundus and body which is greater than normal physiologic activity. Differential would include gastritis versus lymphoma or primary gastric malignancy. As there is no mass lesion identified, malignancy is less favored. Recommend upper endoscopy and potentially tissue sampling. 3. Diffuse activity associated the distal esophagus may relate to abnormal hypermetabolic stomach. 4. No evidence of local colorectal carcinoma at the sigmoid anastomosis. 5. No evidence of metastatic disease in the abdomen pelvis.  Scheduled now for Biopsy left upper lobe nodule   Past Medical History:  Diagnosis Date  . A-fib ([place])   . Arthritis   . BPH (benign prostatic hyperplasia)   . Chest pain, unspecified   . Chronic kidney disease   . Coronary atherosclerosis of unspecified type of vessel, native or graft   . Dizziness and giddiness   . Hyperlipidemia   . Hypertension   . Myocardial infarction ([place])    x2  . Non-ST elevation (NSTEMI) myocardial infarction ([place])   . Peripheral neuropathy   . PVD (peripheral vascular disease) ([place])   . Shortness of breath    exertion  . Vitamin B12 deficiency     Past Surgical History:  Procedure Laterality Date  . COLON SURGERY     partial colectomy Dr. Marcello Moores  12-14-17  . CORONARY ANGIOPLASTY     4 stents  . CORONARY STENT INTERVENTION N/A 08/09/2017   Procedure: CORONARY STENT INTERVENTION;  Surgeon: Nelva Bush, MD;  Location: [place] [facility];  Service: Cardiovascular;  Laterality: N/A;  . FLEXIBLE SIGMOIDOSCOPY N/A 11/12/2017   Procedure: FLEXIBLE SIGMOIDOSCOPY;  Surgeon: Irene Shipper, MD;  [facility];  Service: Endoscopy;  Laterality: N/A;  . HERNIA REPAIR     right inguinal  . INGUINAL HERNIA REPAIR  06/14/2012   Procedure: HERNIA REPAIR INGUINAL ADULT;  Surgeon: Odis Hollingshead, MD;  Location: [place];  Service: General;  Laterality: Right;  . INSERTION OF MESH  06/14/2012   Procedure: INSERTION OF MESH;  Surgeon: Odis Hollingshead, MD;  Location: [place];  Service: General;  Laterality: Right;  . LAPAROSCOPIC PARTIAL COLECTOMY N/A 12/26/2017   Procedure: LAPAROSCOPIC PARTIAL COLECTOMY ERAS PATHWAY;  Surgeon: Leighton Ruff, MD;  [facility];  Service: General;  Laterality: N/A;  . LEFT HEART CATH AND CORONARY ANGIOGRAPHY N/A 08/09/2017   Procedure: LEFT HEART CATH AND CORONARY ANGIOGRAPHY;  Surgeon: Nelva Bush, MD;  Location: [place] [facility];  Service: Cardiovascular;  Laterality: N/A;  . PROCTOSCOPY N/A 12/26/2017   Procedure: PROCTOSCOPY;  Surgeon: Leighton Ruff, MD;  [facility];  Service: General;  Laterality: N/A;  . SINUS SURGERY WITH INSTATRAK  1985    Allergies: Gadolinium derivatives  Medications: Prior to Admission medications   Medication Sig Start Date End Date Taking? Authorizing Provider  atorvastatin (LIPITOR) 40 MG tablet TAKE ONE TABLET DAILY AT 6PM Patient taking differently: Take 40 mg  by mouth daily.  11/13/17  Yes Burnell Blanks, MD  clopidogrel (PLAVIX) 75 MG tablet TAKE ONE TABLET EACH DAY Patient taking differently: Take 75 mg by mouth daily.  09/03/17  Yes Burnell Blanks, MD  lisinopril (PRINIVIL,ZESTRIL) 5 MG tablet Take 0.5 tablets (2.5 mg total) by mouth daily.  03/13/18  Yes Burnell Blanks, MD  metoprolol succinate (TOPROL-XL) 25 MG 24 hr tablet Take 3 tablets (75 mg total) by mouth daily. 01/07/18  Yes Burnell Blanks, MD  vitamin B-12 (CYANOCOBALAMIN) 1000 MCG tablet Take 1,000 mcg by mouth daily.   Yes [provider]  warfarin (COUMADIN) 5 MG tablet TAKE AS DIRECTED BY COUMADIN CLINIC Patient taking differently: Take 5-7.5 mg by mouth See admin instructions. Take as directed by coumadin clinic; 7.5 mg (5 mg x 1.5) every Fri; 5 mg (5 mg x 1) all other days 06/28/18  Yes Burnell Blanks, MD     Family History  Problem Relation Age of Onset  . Heart attack Father 60  . Arthritis Sister   . Hypothyroidism Daughter     Social History   Socioeconomic History  . Marital status: Divorced    Spouse name: Not on file  . Number of children: 3  . Years of education: Not on file  . Highest education level: Not on file  Occupational History  . Occupation: Magazine features editor    Comment: [employer]  Social Needs  . Financial resource strain: Not on file  . Food insecurity:    Worry: Not on file    Inability: Not on file  . Transportation needs:    Medical: Not on file    Non-medical: Not on file  Tobacco Use  . Smoking status: Former Smoker    Last attempt to quit: 07/10/1981    Years since quitting: 37.0  . Smokeless tobacco: Never Used  Substance and Sexual Activity  . Alcohol use: No  . Drug use: No  . Sexual activity: Not on file  Lifestyle  . Physical activity:    Days per week: Not on file    Minutes per session: Not on file  . Stress: Not on file  Relationships  . Social connections:    Talks on phone: Not on file    Gets together: Not on file    Attends religious service: Not on file    Active member of club or organization: Not on file    Attends meetings of clubs or organizations: Not on file    Relationship status: Not on file  Other Topics Concern  . Not on file  Social  History Narrative   Regular exercise            Review of Systems: A 12 point ROS discussed and pertinent positives are indicated in the HPI above.  All other systems are negative.  Review of Systems  Constitutional: Negative for activity change, fatigue and fever.  HENT: Positive for hearing loss.   Respiratory: Negative for cough and shortness of breath.   Cardiovascular: Negative for chest pain.  Musculoskeletal: Negative for back pain.  Neurological: Negative for weakness.  Psychiatric/Behavioral: Negative for behavioral problems and confusion.    Vital Signs: BP (!) 140/59   Pulse 60   Temp 97.9 F (36.6 C)   Resp 18   Ht 5\' 11"  (1.803 m)   Wt 174 lb (78.9 kg)   SpO2 92%   BMI 24.27 kg/m   Physical Exam Vitals signs reviewed.  Cardiovascular:     Rate and Rhythm: Normal rate and regular rhythm.  Pulmonary:     Effort: Pulmonary effort is normal.     Breath sounds: Normal breath sounds.  Abdominal:     General: Bowel sounds are normal.     Palpations: Abdomen is soft.  Musculoskeletal: Normal range of motion.  Skin:    General: Skin is warm and dry.  Neurological:     General: No focal deficit present.     Mental Status: He is alert and oriented to person, place, and time.  Psychiatric:        Mood and Affect: Mood normal.        Behavior: Behavior normal.        Thought Content: Thought content normal.        Judgment: Judgment normal.     Imaging: Nm Pet Image Initial (pi) Skull Base To Thigh  Result Date: 06/26/2018 CLINICAL DATA:  Initial treatment strategy for sigmoid colon carcinoma. EXAM: NUCLEAR MEDICINE PET SKULL BASE TO THIGH TECHNIQUE: 9.6 mCi F-18 FDG was injected intravenously. Full-ring PET imaging was performed from the skull base to thigh after the radiotracer. CT data was obtained and used for attenuation correction and anatomic localization. Fasting blood glucose: 99 mg/dl COMPARISON:  None. FINDINGS: Mediastinal blood pool activity:  SUV max 1.81 NECK: No hypermetabolic lymph nodes in the neck. Incidental CT findings: none CHEST: Within the LEFT upper lobe solid and sub solid nodule is again noted measuring up to 20 mm including the sub solid portion. This central solid portion measures 11 mm and has associated metabolic activity (SUV max equal 4.5). No additional hypermetabolic pulmonary nodules. No hypermetabolic mediastinal lymph nodes. Incidental CT findings: Coronary artery calcification and aortic atherosclerotic calcification. ABDOMEN/PELVIS: There is intense circumferential metabolic activity involving the gastric fundus, gastric body and extending to the gastric antrum. Activity is intense with SUV max equal 14.2. There is no mass lesion identified on CT portion of exam. There is mild activity through the distal esophagus from the level the carina to the GE junction. No abnormal activity in the liver. No hypermetabolic abdominopelvic lymph nodes. Symmetric activity in the adrenal glands noted. Prostate is enlarged. Anastomosis in the sigmoid colon noted. No evidence obstruction no metabolic at nodularity. No hypermetabolic pelvic lymph nodes. Incidental CT findings: none SKELETON: No focal hypermetabolic activity to suggest skeletal metastasis. Incidental CT findings: none IMPRESSION: 1. Hypermetabolic LEFT upper lobe pulmonary nodule. The solid portion of this nodule is increased over time. Findings concerning for BRONCHOGENIC CARCINOMA. 2. Intense radiotracer activity within the gastric fundus and body which is greater than normal physiologic activity. Differential would include gastritis versus lymphoma or primary gastric malignancy. As there is no mass lesion identified, malignancy is less favored. Recommend upper endoscopy and potentially tissue sampling. 3. Diffuse activity associated the distal esophagus may relate to abnormal hypermetabolic stomach. 4. No evidence of local colorectal carcinoma at the sigmoid anastomosis. 5. No  evidence of metastatic disease in the abdomen pelvis. Electronically Signed   By: Suzy Bouchard M.D.   On: 06/26/2018 17:10    Labs:  CBC: Recent Labs    12/27/17 0432 12/28/17 0426 12/29/17 0507 07/09/18 0941  WBC 10.0 11.2* 8.6 8.4  HGB 12.8* 12.5* 11.5* 13.5  HCT 40.1 38.9* 35.6* 43.2  PLT 163 169 168 231    COAGS: Recent Labs    05/30/18 0958 06/13/18 0929 06/27/18 1320 07/09/18 0941  INR 3.3* 4.0* 3.2* 1.17    BMP: Recent  Labs    12/29/17 0507 03/04/18 1049 03/12/18 1610 03/27/18 1057  NA 139 140 145* 143  K 4.3 3.8 3.9 4.2  CL 106 101 104 103  CO2 27 26 25 26   GLUCOSE 100* 101* 95 92  BUN 30* 18 24 25   CALCIUM 8.3* 9.6 8.9 9.3  CREATININE 1.19 1.17 1.34* 1.26  GFRNONAA 53* 56* 48* 51*  GFRAA >60 65 55* 59*    LIVER FUNCTION TESTS: No results for input(s): BILITOT, AST, ALT, ALKPHOS, PROT, ALBUMIN in the last 8760 hours.  TUMOR MARKERS: No results for input(s): AFPTM, CEA, CA199, CHROMGRNA in the last 8760 hours.  Assessment and Plan:  Colon Ca Enlarging LUL nodule +PET Scheduled for biopsy Risks and benefits discussed with the patient including, but not limited to bleeding, hemoptysis, respiratory failure requiring intubation, infection, pneumothorax requiring chest tube placement, stroke from air embolism or even death.  All of the patient's questions were answered, patient is agreeable to proceed. Consent signed and in chart.   Thank you for this interesting consult.  I greatly enjoyed meeting [name] and look forward to participating in their care.  A copy of this report was sent to the requesting provider on this date.  Electronically Signed: Lavonia Drafts, PA-C 07/09/2018, 10:27 AM   I spent a total of  30 Minutes   in face to face in clinical consultation, greater than 50% of which was counseling/coordinating care for LUL nodule bx

## 2018-07-09 NOTE — H&P (Signed)
History and Physical    [name]:[id number] DOB: 1930-05-28 DOA: 07/09/2018  PCP: Marton Redwood, MD Consultants:  [facility] - cardiology; Henrene Pastor - G[facility] Snare - CT surgery; [facility] - surgery; [facility] - oncology Patient coming from:  Home - lives alone, next to his daughter; [name]: Daughter, [phone]  Chief Complaint:  Procedural complication  HPI: [name] is a 84 y.o. male with medical history significant of PVD; CAD; HTN; HLD; CKD; and afib presenting for lung biopsy, who suffered a pneumothorax.   He came to get a biopsy due to a spot on his lung.  They saw Dr. Benay Spice in maybe July and wasn't too concerned then, despite h/o colon cancer with resection.  The mass has enlarged and they sent him for biopsy.  He has some current discomfort from the chest tubes.  No difficulty breathing but deep breathing causes discomfort.  Prior to the procedure, he is continuing to have discomfort from the colon surgery.   He has been having GI symptoms for a while now - during the night he develops lower abdominal discomfort L>R and it is prominent in the AM when he wakes up and then it resolves during the morning.  He does have some reflux/indigestion symptoms, as well - worse when he lies on his right side, relieved with belching when he rolls over.  He notices the problem sometimes overnight and takes Zantac with improvement in symptoms.  This has been getting worse.  Recent left hip pain with occasional radiation down the left.  He fell 8+ months ago and thinks this may have been related.   ED Course: Patient in IR for lung biopsy and had pneumothorax.  Chest tube was placed.  Patient needs observation for 24-48 hours.  CT surgery will be consulted.  Review of Systems: As per HPI; otherwise review of systems reviewed and negative.   Ambulatory Status:  Ambulates without assistance  Past Medical History:  Diagnosis Date  . A-fib ([place])   . Arthritis   . BPH (benign prostatic  hyperplasia)   . Chest pain, unspecified   . Chronic kidney disease   . Coronary atherosclerosis of unspecified type of vessel, native or graft   . Dizziness and giddiness   . Hyperlipidemia   . Hypertension   . Myocardial infarction ([place])    x2  . Non-ST elevation (NSTEMI) myocardial infarction ([place])   . Peripheral neuropathy   . PVD (peripheral vascular disease) ([place])   . Shortness of breath    exertion  . Vitamin B12 deficiency     Past Surgical History:  Procedure Laterality Date  . COLON SURGERY     partial colectomy Dr. Marcello Moores 12-14-17  . CORONARY ANGIOPLASTY     4 stents  . CORONARY STENT INTERVENTION N/A 08/09/2017   Procedure: CORONARY STENT INTERVENTION;  Surgeon: Nelva Bush, MD;  Location: [place] [facility];  Service: Cardiovascular;  Laterality: N/A;  . FLEXIBLE SIGMOIDOSCOPY N/A 11/12/2017   Procedure: FLEXIBLE SIGMOIDOSCOPY;  Surgeon: Irene Shipper, MD;  [facility];  Service: Endoscopy;  Laterality: N/A;  . HERNIA REPAIR     right inguinal  . INGUINAL HERNIA REPAIR  06/14/2012   Procedure: HERNIA REPAIR INGUINAL ADULT;  Surgeon: Odis Hollingshead, MD;  Location: [place];  Service: General;  Laterality: Right;  . INSERTION OF MESH  06/14/2012   Procedure: INSERTION OF MESH;  Surgeon: Odis Hollingshead, MD;  Location: [place];  Service: General;  Laterality: Right;  . LAPAROSCOPIC  PARTIAL COLECTOMY N/A 12/26/2017   Procedure: LAPAROSCOPIC PARTIAL COLECTOMY ERAS PATHWAY;  Surgeon: Leighton Ruff, MD;  [facility];  Service: General;  Laterality: N/A;  . LEFT HEART CATH AND CORONARY ANGIOGRAPHY N/A 08/09/2017   Procedure: LEFT HEART CATH AND CORONARY ANGIOGRAPHY;  Surgeon: Nelva Bush, MD;  Location: [place] [facility];  Service: Cardiovascular;  Laterality: N/A;  . PROCTOSCOPY N/A 12/26/2017   Procedure: PROCTOSCOPY;  Surgeon: Leighton Ruff, MD;  [facility];  Service: General;  Laterality: N/A;  . [place]  History   Socioeconomic History  . Marital status: Divorced    Spouse name: Not on file  . Number of children: 3  . Years of education: Not on file  . Highest education level: Not on file  Occupational History  . Occupation: Magazine features editor    Comment: [employer]  Social Needs  . Financial resource strain: Not on file  . Food insecurity:    Worry: Not on file    Inability: Not on file  . Transportation needs:    Medical: Not on file    Non-medical: Not on file  Tobacco Use  . Smoking status: Former Smoker    Last attempt to quit: 07/10/1981    Years since quitting: 37.0  . Smokeless tobacco: Never Used  Substance and Sexual Activity  . Alcohol use: No  . Drug use: No  . Sexual activity: Not on file  Lifestyle  . Physical activity:    Days per week: Not on file    Minutes per session: Not on file  . Stress: Not on file  Relationships  . Social connections:    Talks on phone: Not on file    Gets together: Not on file    Attends religious service: Not on file    Active member of club or organization: Not on file    Attends meetings of clubs or organizations: Not on file    Relationship status: Not on file  . Intimate partner violence:    Fear of current or ex partner: Not on file    Emotionally abused: Not on file    Physically abused: Not on file    Forced sexual activity: Not on file  Other Topics Concern  . Not on file  Social History Narrative   Regular exercise          Allergies  Allergen Reactions  . Gadolinium Derivatives Hives, Itching and Other (See Comments)    Dr. Jeralyn Ruths s/w Mr. Shabazz. We observed him for 15 minutes. He did not need to take Benadryl. The symptoms began to subside before he left.     Family History  Problem Relation Age of Onset  . Heart attack Father 37  . Arthritis Sister   . Hypothyroidism Daughter     Prior to Admission medications   Medication Sig Start Date End Date Taking? Authorizing Provider    atorvastatin (LIPITOR) 40 MG tablet TAKE ONE TABLET DAILY AT 6PM Patient taking differently: Take 40 mg by mouth daily.  11/13/17  Yes Burnell Blanks, MD  clopidogrel (PLAVIX) 75 MG tablet TAKE ONE TABLET EACH DAY Patient taking differently: Take 75 mg by mouth daily.  09/03/17  Yes Burnell Blanks, MD  lisinopril (PRINIVIL,ZESTRIL) 5 MG tablet Take 0.5 tablets (2.5 mg total) by mouth daily. 03/13/18  Yes Burnell Blanks, MD  metoprolol succinate (TOPROL-XL) 25 MG 24 hr tablet Take 3 tablets (75 mg  total) by mouth daily. 01/07/18  Yes Burnell Blanks, MD  vitamin B-12 (CYANOCOBALAMIN) 1000 MCG tablet Take 1,000 mcg by mouth daily.   Yes [provider]  warfarin (COUMADIN) 5 MG tablet TAKE AS DIRECTED BY COUMADIN CLINIC Patient taking differently: Take 5-7.5 mg by mouth See admin instructions. Take as directed by coumadin clinic; 7.5 mg (5 mg x 1.5) every Fri; 5 mg (5 mg x 1) all other days 06/28/18  Yes Burnell Blanks, MD    Physical Exam: Vitals:   07/09/18 1345 07/09/18 1400 07/09/18 1430 07/09/18 1500  BP: (!) 118/52 (!) 133/59 (!) 141/89 140/68  Pulse: 63 62 64 64  Resp: 18 18 16 16   Temp:      SpO2: 96% 98% 96% 100%  Weight:      Height:         General:  Appears calm and comfortable and is NAD; he is quite conversant without dyspnea, chest tube in place Eyes:   EOMI, normal lids, iris ENT:  grossly normal hearing, lips & tongue, mmm; poor/mostly absent dentition Neck:  no LAD, masses or thyromegaly Cardiovascular:  RRR, no m/r/g. No LE edema.  Respiratory:   CTA bilaterally with no wheezes/rales/rhonchi.  Normal respiratory effort.  Chest tube in place in left upper chest. Abdomen:  soft, NT, ND, NABS Skin:  no rash or induration seen on limited exam Musculoskeletal:  grossly normal tone BUE/BLE, good ROM, no bony abnormality Psychiatric:  grossly normal mood and affect, speech fluent and appropriate, AOx3 Neurologic:  CN 2-12  grossly intact, moves all extremities in coordinated fashion, sensation intact    Radiological Exams on Admission: No results found.  EKG: not done   Labs on Admission: I have personally reviewed the available labs and imaging studies at the time of the admission.  Pertinent labs:   Normal CBC INR 1.17   Assessment/Plan Principal Problem:   Pneumothorax of left lung after biopsy Active Problems:   Atrial fibrillation (HCC)   Hypertension   Long term (current) use of anticoagulants [Z79.01]   Cancer of sigmoid colon s/p sigmoid colectomy 12/26/2017   Lung nodule   Pneumothorax after lung biopsy -Patient had CT-guided biopsy -He was found to have a pneumothorax immediately following the procedure -Chest tube was placed -CT surgery consulted by IR -Requested to observe the patient overnight on Med Surg  -Likely able to remove chest tube tomorrow afternoon or the following morning, and probable d/c to home 1/2  Lung nodule -Recent (12/18) PET scan indicates hypermetabolic LUL nodule with increasing size of solid nodule concerning for bronchogenic carcinoma -CT guided core biopsy performed after CT placement -2 biopsies obtained and pending  Afib on Coumadin -Coumadin is on hold for procedure, as is Plavix; continue to hold for now given his current PTX  HTN -Continue Lisinopril and Toprol, resuming both medications tomorrow AM  Colon CA  -Patient has some chronic symptoms with discomfort in his lower abdomen daily upon wakening and then improvement through the day -He also recently has had worsening GERD symptoms -Recent PET scan showed increased radiotracer activity in the gastric fundus/body without a mass lesion - possible gastritis > malignancy -EGD has been encouraged, but this will be delayed for now given his acute issue -Will start Protonix PO BID, since the patient thinks that gastritis seems probable   DVT prophylaxis:  Lovenox Code Status:  DNR -  confirmed with patient/family Family Communication: Daughter and [facility] CRNA friend (?) were present throughout evaluation Disposition  Plan:  Home once clinically improved Consults called: IR; CT surgery  Admission status: It is my clinical opinion that referral for OBSERVATION is reasonable and necessary in this patient based on the above information provided. The aforementioned taken together are felt to place the patient at high risk for further clinical deterioration. However it is anticipated that the patient may be medically stable for discharge from the hospital within 24 to 48 hours.    Karmen Bongo MD [facility]  If note is complete, please contact covering daytime or nighttime physician. [email or web address] Password [email or web address]  07/09/2018, 3:26 PM

## 2018-07-09 NOTE — Procedures (Signed)
Interventional Radiology Procedure:   Indications: Suspicious left upper lobe nodule.  History of colon cancer  Procedure: CT guided left lung nodule biopsy.  CT guided placement of pleural catheter for pneumothorax  Findings: Immediate pneumothorax after placement of biopsy needle.   10 Fr pigtail drain placed without complication and lung re-inflated immediately.  CT guided core biopsy performed after chest tube placement.  2 cores obtained.   Complications: Pneumothorax requiring chest tube placement     EBL: Minimal  Plan: Admit to hospital for pneumothorax and chest tube management.     Akiel Fennell R. Anselm Pancoast, MD  Pager: [phone]

## 2018-07-09 NOTE — Telephone Encounter (Signed)
Pt's daughter calling states we need to call Dr.McAlheny's office to get clearance for BT. States we can call today and someone should be able to assist with the request. Pt's daughter is requesting call back to know what to do.

## 2018-07-09 NOTE — Telephone Encounter (Signed)
   [facility] Pre-operative Risk Assessment    Request for surgical clearance:  1. What type of surgery is being performed?  EGD (patient had lung biopsy on 07/09/18   2. When is this surgery scheduled? Friday January 3   3. What type of clearance is required (medical clearance vs. Pharmacy clearance to hold med vs. Both)? Both (PA to review for Plavix and Pharmacy for Coumadin)  4. Are there any medications that need to be held prior to surgery and how long? Plavix and Coumadin (see instructions from [facility]); however procedure date is 07/12/18 not 07/11/18  5. Practice name and name of physician performing surgery? Dr. Scarlette Shorts   6. What is your office phone number [phone]    7.   What is your office fax number [phone]  8.   Anesthesia type (None, local, MAC, general) ? MAC (Propofol)    [name] 07/09/2018, 12:48 PM  _________________________________________________________________   (provider comments below)

## 2018-07-09 NOTE — Sedation Documentation (Addendum)
Dr. Lorin Mercy at bedside talking with patient and family.

## 2018-07-09 NOTE — Telephone Encounter (Signed)
Received call from [place], patient is being admitted following lung biopsy today. This procedure will be rescheduled at a later time.

## 2018-07-09 NOTE — Sedation Documentation (Signed)
Patient requiring chest tube. Dr. Anselm Pancoast preparing at this time.

## 2018-07-09 NOTE — Telephone Encounter (Signed)
Spoke with pts daughter and pt is having a CT guided lung bx today, he has been off his coumadin an plavix for this bx. EGD can be done in the [facility] 07/11/18@10 :30am. Daughter states she would like for procedure to be done soon so pt will not have to start and stop anticoag drugs again. Call placed to Dr. Cristy Friedlander office and he is out of the office this week. [name] at his office is having pharmacist review to see about coumadin. Pts daughter is texting Dr. Glennie Hawk to see if she can get information regarding holding the plavix.

## 2018-07-10 ENCOUNTER — Observation Stay (HOSPITAL_COMMUNITY): Payer: Medicare Other

## 2018-07-10 DIAGNOSIS — C187 Malignant neoplasm of sigmoid colon: Secondary | ICD-10-CM

## 2018-07-10 DIAGNOSIS — N400 Enlarged prostate without lower urinary tract symptoms: Secondary | ICD-10-CM | POA: Diagnosis present

## 2018-07-10 DIAGNOSIS — J9601 Acute respiratory failure with hypoxia: Secondary | ICD-10-CM | POA: Diagnosis present

## 2018-07-10 DIAGNOSIS — Z9689 Presence of other specified functional implants: Secondary | ICD-10-CM | POA: Diagnosis not present

## 2018-07-10 DIAGNOSIS — T797XXA Traumatic subcutaneous emphysema, initial encounter: Secondary | ICD-10-CM | POA: Diagnosis not present

## 2018-07-10 DIAGNOSIS — Z955 Presence of coronary angioplasty implant and graft: Secondary | ICD-10-CM | POA: Diagnosis not present

## 2018-07-10 DIAGNOSIS — J9382 Other air leak: Secondary | ICD-10-CM | POA: Diagnosis not present

## 2018-07-10 DIAGNOSIS — J939 Pneumothorax, unspecified: Secondary | ICD-10-CM | POA: Diagnosis not present

## 2018-07-10 DIAGNOSIS — I10 Essential (primary) hypertension: Secondary | ICD-10-CM

## 2018-07-10 DIAGNOSIS — E785 Hyperlipidemia, unspecified: Secondary | ICD-10-CM | POA: Diagnosis present

## 2018-07-10 DIAGNOSIS — I4819 Other persistent atrial fibrillation: Secondary | ICD-10-CM | POA: Diagnosis not present

## 2018-07-10 DIAGNOSIS — I480 Paroxysmal atrial fibrillation: Secondary | ICD-10-CM

## 2018-07-10 DIAGNOSIS — N179 Acute kidney failure, unspecified: Secondary | ICD-10-CM | POA: Diagnosis not present

## 2018-07-10 DIAGNOSIS — Z9049 Acquired absence of other specified parts of digestive tract: Secondary | ICD-10-CM | POA: Diagnosis not present

## 2018-07-10 DIAGNOSIS — I255 Ischemic cardiomyopathy: Secondary | ICD-10-CM | POA: Diagnosis present

## 2018-07-10 DIAGNOSIS — Z87891 Personal history of nicotine dependence: Secondary | ICD-10-CM | POA: Diagnosis not present

## 2018-07-10 DIAGNOSIS — I252 Old myocardial infarction: Secondary | ICD-10-CM | POA: Diagnosis not present

## 2018-07-10 DIAGNOSIS — I2510 Atherosclerotic heart disease of native coronary artery without angina pectoris: Secondary | ICD-10-CM | POA: Diagnosis present

## 2018-07-10 DIAGNOSIS — I129 Hypertensive chronic kidney disease with stage 1 through stage 4 chronic kidney disease, or unspecified chronic kidney disease: Secondary | ICD-10-CM | POA: Diagnosis present

## 2018-07-10 DIAGNOSIS — Z7902 Long term (current) use of antithrombotics/antiplatelets: Secondary | ICD-10-CM | POA: Diagnosis not present

## 2018-07-10 DIAGNOSIS — K219 Gastro-esophageal reflux disease without esophagitis: Secondary | ICD-10-CM | POA: Diagnosis present

## 2018-07-10 DIAGNOSIS — J95811 Postprocedural pneumothorax: Secondary | ICD-10-CM | POA: Diagnosis present

## 2018-07-10 DIAGNOSIS — I739 Peripheral vascular disease, unspecified: Secondary | ICD-10-CM | POA: Diagnosis present

## 2018-07-10 DIAGNOSIS — Z79899 Other long term (current) drug therapy: Secondary | ICD-10-CM | POA: Diagnosis not present

## 2018-07-10 DIAGNOSIS — N183 Chronic kidney disease, stage 3 (moderate): Secondary | ICD-10-CM | POA: Diagnosis present

## 2018-07-10 DIAGNOSIS — R911 Solitary pulmonary nodule: Secondary | ICD-10-CM | POA: Diagnosis not present

## 2018-07-10 DIAGNOSIS — C3412 Malignant neoplasm of upper lobe, left bronchus or lung: Secondary | ICD-10-CM | POA: Diagnosis present

## 2018-07-10 DIAGNOSIS — Z4682 Encounter for fitting and adjustment of non-vascular catheter: Secondary | ICD-10-CM | POA: Diagnosis not present

## 2018-07-10 DIAGNOSIS — Z66 Do not resuscitate: Secondary | ICD-10-CM | POA: Diagnosis present

## 2018-07-10 DIAGNOSIS — Y848 Other medical procedures as the cause of abnormal reaction of the patient, or of later complication, without mention of misadventure at the time of the procedure: Secondary | ICD-10-CM | POA: Diagnosis present

## 2018-07-10 DIAGNOSIS — Z85038 Personal history of other malignant neoplasm of large intestine: Secondary | ICD-10-CM | POA: Diagnosis not present

## 2018-07-10 DIAGNOSIS — Z7901 Long term (current) use of anticoagulants: Secondary | ICD-10-CM | POA: Diagnosis not present

## 2018-07-10 LAB — CBC
HCT: 38.9 % — ABNORMAL LOW (ref 39.0–52.0)
Hemoglobin: 12.4 g/dL — ABNORMAL LOW (ref 13.0–17.0)
MCH: 27.9 pg (ref 26.0–34.0)
MCHC: 31.9 g/dL (ref 30.0–36.0)
MCV: 87.4 fL (ref 80.0–100.0)
Platelets: 198 10*3/uL (ref 150–400)
RBC: 4.45 MIL/uL (ref 4.22–5.81)
RDW: 13.2 % (ref 11.5–15.5)
WBC: 7.2 10*3/uL (ref 4.0–10.5)
nRBC: 0 % (ref 0.0–0.2)

## 2018-07-10 LAB — BASIC METABOLIC PANEL
Anion gap: 8 (ref 5–15)
BUN: 20 mg/dL (ref 8–23)
CO2: 25 mmol/L (ref 22–32)
Calcium: 8.7 mg/dL — ABNORMAL LOW (ref 8.9–10.3)
Chloride: 104 mmol/L (ref 98–111)
Creatinine, Ser: 1.23 mg/dL (ref 0.61–1.24)
GFR calc Af Amer: >60 mL/min (ref >60)
GFR, EST NON AFRICAN AMERICAN: 53 mL/min — AB (ref >60)
Glucose, Bld: 149 mg/dL — ABNORMAL HIGH (ref 70–99)
Potassium: 4.1 mmol/L (ref 3.5–5.1)
SODIUM: 137 mmol/L (ref 135–145)

## 2018-07-10 MED ORDER — ENOXAPARIN SODIUM 80 MG/0.8ML ~~LOC~~ SOLN
80.0000 mg | Freq: Two times a day (BID) | SUBCUTANEOUS | Status: DC
  Administered 2018-07-10 – 2018-07-13 (×3): 80 mg via SUBCUTANEOUS
  Filled 2018-07-10 (×4): qty 0.8

## 2018-07-10 NOTE — Progress Notes (Signed)
Chief Complaint: Patient was seen today for follow up (L)PTX after lung bx  Referring Physician(s): Grace Isaac  Supervising Physician: Arne Cleveland  Patient Status: [facility] - In-pt  Subjective: Pt sitting up in bed. C/o some discomfort at tube site and with deep inspiration.  Objective: Physical Exam: BP (!) 141/65 (BP Location: Right Arm)   Pulse 74   Temp 98 F (36.7 C) (Oral)   Resp 16   Ht 5\' 11"  (1.803 m)   Wt 78.9 kg   SpO2 94%   BMI 24.27 kg/m  (R)ant chest tube intact. Site clean, no hematoma All connections tight. Still small airleak, small amount serous fluid output.   Current Facility-Administered Medications:  .  acetaminophen (TYLENOL) tablet 650 mg, 650 mg, Oral, Q6H PRN **OR** acetaminophen (TYLENOL) suppository 650 mg, 650 mg, Rectal, Q6H PRN, Karmen Bongo, MD .  atorvastatin (LIPITOR) tablet 40 mg, 40 mg, Oral, Daily, Karmen Bongo, MD .  docusate sodium (COLACE) capsule 100 mg, 100 mg, Oral, BID, Karmen Bongo, MD .  enoxaparin (LOVENOX) injection 40 mg, 40 mg, Subcutaneous, Q24H, Karmen Bongo, MD, 40 mg at 07/09/18 1646 .  fentaNYL (SUBLIMAZE) injection, , Intravenous, PRN, Markus Daft, MD, 25 mcg at 07/09/18 1215 .  lisinopril (PRINIVIL,ZESTRIL) tablet 2.5 mg, 2.5 mg, Oral, Daily, Karmen Bongo, MD .  metoprolol succinate (TOPROL-XL) 24 hr tablet 75 mg, 75 mg, Oral, Daily, Karmen Bongo, MD .  midazolam (VERSED) injection, , Intravenous, PRN, Markus Daft, MD, 0.5 mg at 07/09/18 1216 .  morphine 2 MG/ML injection 2 mg, 2 mg, Intravenous, Q2H PRN, Karmen Bongo, MD, 2 mg at 07/10/18 0603 .  ondansetron (ZOFRAN) tablet 4 mg, 4 mg, Oral, Q6H PRN **OR** ondansetron (ZOFRAN) injection 4 mg, 4 mg, Intravenous, Q6H PRN, Karmen Bongo, MD .  pantoprazole (PROTONIX) EC tablet 40 mg, 40 mg, Oral, BID Meliton Rattan, MD, 40 mg at 07/09/18 1646  Labs: CBC Recent Labs    07/09/18 0941 07/10/18 0225  WBC 8.4 7.2  HGB 13.5 12.4*    HCT 43.2 38.9*  PLT 231 198   BMET Recent Labs    07/09/18 1901 07/10/18 0225  NA 139 137  K 4.3 4.1  CL 103 104  CO2 26 25  GLUCOSE 148* 149*  BUN 18 20  CREATININE 1.12 1.23  CALCIUM 9.0 8.7*   LFT No results for input(s): PROT, ALBUMIN, AST, ALT, ALKPHOS, BILITOT, BILIDIR, IBILI, LIPASE in the last 72 hours. PT/INR Recent Labs    07/09/18 0941  LABPROT 14.8  INR 1.17     Studies/Results: Ct Biopsy  Result Date: 07/09/2018 INDICATION: 85 year old with a suspicious lesion in the left upper lobe. History of colon cancer. Plan for CT-guided left upper lobe nodule biopsy. EXAM: CT-GUIDED BIOPSY OF LEFT UPPER LOBE PULMONARY NODULE CT-GUIDED PLACEMENT OF LEFT CHEST TUBE MEDICATIONS: None. ANESTHESIA/SEDATION: Moderate (conscious) sedation was employed during this procedure. A total of Versed 1.5 mg and Fentanyl 50 mcg was administered intravenously. Moderate Sedation Time: 32 minutes. The patient's level of consciousness and vital signs were monitored continuously by radiology nursing throughout the procedure under my direct supervision. FLUOROSCOPY TIME:  None COMPLICATIONS: Pneumothorax that required chest tube placement. PROCEDURE: Informed written consent was obtained from the patient after a thorough discussion of the procedural risks, benefits and alternatives. All questions were addressed. A timeout was performed prior to the initiation of the procedure. Patient was placed supine. CT images through the chest were obtained. Small lesion in the periphery of the left  upper lobe was identified and targeted. The anterior chest was prepped with chlorhexidine and sterile field was created. Maximal barrier sterile technique was utilized including caps, mask, sterile gowns, sterile gloves, sterile drape, hand hygiene and skin antiseptic. Skin was anesthetized with 1% lidocaine. A 17 gauge coaxial needle was directed into the left lung with CT guidance and there was an immediate left  pneumothorax. Left pneumothorax continued to enlarge over a few minutes although the patient was asymptomatic. Decision was made to place a chest tube. Amplatz wire was advanced through the existing needle and the tract was dilated to accommodate a 10.2 [place] multipurpose drain. Drain was attached to a PleurEvac and wall suction. After placement to suction, the pneumothorax resolved. A 17 gauge needle was then directed slightly lateral to the existing chest tube and into the lesion with CT guidance. Two core biopsies were obtained with an 18 gauge core device. Specimens placed in formalin. Needle was removed without complication. The chest tube was sutured to the skin. FINDINGS: Small irregular opacity in the periphery of the left upper lobe. Patient immediately developed a pneumothorax following placement of the needle into the left lung. The pneumothorax was enlarging over a few minutes although the patient remained asymptomatic. Chest tube was required in order to perform the biopsy. Chest tube successfully placed along the anterior left lung. Biopsy needle was confirmed within the lesion. Two core biopsies were obtained. At the end of the procedure, there was minimal parenchymal hemorrhage around the lesion. Pneumothorax had essentially resolved with the left anterior chest tube. IMPRESSION: CT-guided biopsy of the left upper lobe lung lesion. Procedure was complicated by a pneumothorax. Pneumothorax was successfully treated with a CT-guided chest tube placement. Patient will be admitted for chest tube management. Electronically Signed   By: Markus Daft M.D.   On: 07/09/2018 16:35   Dg Chest Port 1 View  Result Date: 07/09/2018 CLINICAL DATA:  Left chest tube. EXAM: PORTABLE CHEST 1 VIEW COMPARISON:  08/08/2017.  Chest CT for biopsy earlier today. FINDINGS: Left chest tube is in place. Tiny left apical pneumothorax. Heart is normal size. Coronary artery stents noted. No confluent airspace opacities or  effusions. IMPRESSION: Left chest tube in place with tiny left apical pneumothorax. Electronically Signed   By: Rolm Baptise M.D.   On: 07/09/2018 16:57   Ct Perc Pleural Drain W/indwell Cath W/img Guide  Result Date: 07/09/2018 INDICATION: 85 year old with a suspicious lesion in the left upper lobe. History of colon cancer. Plan for CT-guided left upper lobe nodule biopsy. EXAM: CT-GUIDED BIOPSY OF LEFT UPPER LOBE PULMONARY NODULE CT-GUIDED PLACEMENT OF LEFT CHEST TUBE MEDICATIONS: None. ANESTHESIA/SEDATION: Moderate (conscious) sedation was employed during this procedure. A total of Versed 1.5 mg and Fentanyl 50 mcg was administered intravenously. Moderate Sedation Time: 32 minutes. The patient's level of consciousness and vital signs were monitored continuously by radiology nursing throughout the procedure under my direct supervision. FLUOROSCOPY TIME:  None COMPLICATIONS: Pneumothorax that required chest tube placement. PROCEDURE: Informed written consent was obtained from the patient after a thorough discussion of the procedural risks, benefits and alternatives. All questions were addressed. A timeout was performed prior to the initiation of the procedure. Patient was placed supine. CT images through the chest were obtained. Small lesion in the periphery of the left upper lobe was identified and targeted. The anterior chest was prepped with chlorhexidine and sterile field was created. Maximal barrier sterile technique was utilized including caps, mask, sterile gowns, sterile gloves, sterile drape, hand hygiene  and skin antiseptic. Skin was anesthetized with 1% lidocaine. A 17 gauge coaxial needle was directed into the left lung with CT guidance and there was an immediate left pneumothorax. Left pneumothorax continued to enlarge over a few minutes although the patient was asymptomatic. Decision was made to place a chest tube. Amplatz wire was advanced through the existing needle and the tract was dilated to  accommodate a 10.2 [place] multipurpose drain. Drain was attached to a PleurEvac and wall suction. After placement to suction, the pneumothorax resolved. A 17 gauge needle was then directed slightly lateral to the existing chest tube and into the lesion with CT guidance. Two core biopsies were obtained with an 18 gauge core device. Specimens placed in formalin. Needle was removed without complication. The chest tube was sutured to the skin. FINDINGS: Small irregular opacity in the periphery of the left upper lobe. Patient immediately developed a pneumothorax following placement of the needle into the left lung. The pneumothorax was enlarging over a few minutes although the patient remained asymptomatic. Chest tube was required in order to perform the biopsy. Chest tube successfully placed along the anterior left lung. Biopsy needle was confirmed within the lesion. Two core biopsies were obtained. At the end of the procedure, there was minimal parenchymal hemorrhage around the lesion. Pneumothorax had essentially resolved with the left anterior chest tube. IMPRESSION: CT-guided biopsy of the left upper lobe lung lesion. Procedure was complicated by a pneumothorax. Pneumothorax was successfully treated with a CT-guided chest tube placement. Patient will be admitted for chest tube management. Electronically Signed   By: Markus Daft M.D.   On: 07/09/2018 16:35    Assessment/Plan: S/p (L)lung bx yesterday PTX s/p chest tube placement. CXR pending but still +air leak so tube needs to remain on suction Encouraged IS use. Repeat CXR tomorrow, hopefully will be able to switch to water seal    LOS: 0 days   I spent a total of 15 minutes in face to face in clinical consultation, greater than 50% of which was counseling/coordinating care for (L)PTX after bx  Ascencion Dike PA-C 07/10/2018 9:04 AM

## 2018-07-10 NOTE — Progress Notes (Signed)
PROGRESS NOTE    [name]   OZD:[id number]  DOB: 05-01-1930  DOA: 07/09/2018 PCP: Marton Redwood, MD   Brief Narrative:  [name]   is a 85 y.o. male with medical history significant of PVD; CAD; HTN; HLD; CKD; and afib presenting for lung biopsy, who suffered a pneumothorax.   He came to get a lung biopsy by IR and subsequently developed a pneumothorax. A chest tube was placed and [facility] were asked to admit.    Subjective: Having pain at chest tube site. ROS: no complaints of nausea, vomiting, constipation diarrhea, cough, dyspnea or dysuria. No other complaints.   Assessment & Plan:   Principal Problem:   Pneumothorax of left lung after biopsy - management per IR- repeat CXR show resolved pneumothorax  Active Problems:   Atrial fibrillation  - resume Coumadin- continue Metoprolol    Lung nodule- PET positive - f/u on biopsy    Cancer of sigmoid colon s/p sigmoid colectomy 12/26/2017   DVT prophylaxis: INR therapeutic Code Status: DNR Family Communication:  Disposition Plan: home when PTX resolves Consultants:   IR Procedures:   Chest tube Antimicrobials:  Anti-infectives (From admission, onward)   None       Objective: Vitals:   07/09/18 1615 07/09/18 2122 07/10/18 0213 07/10/18 0608  BP: 124/60 (!) 113/50 (!) 126/59 (!) 141/65  Pulse: 64 63 65 74  Resp: 16 16 18 16   Temp: 98.4 F (36.9 C) 98 F (36.7 C) 97.7 F (36.5 C) 98 F (36.7 C)  TempSrc: Oral Oral Oral Oral  SpO2: 96% 94%    Weight: 78.9 kg     Height: 5\' 11"  (1.803 m)       Intake/Output Summary (Last 24 hours) at 07/10/2018 1416 Last data filed at 07/09/2018 2300 Gross per 24 hour  Intake -  Output 0 ml  Net 0 ml   Filed Weights   07/09/18 0918 07/09/18 1615  Weight: 78.9 kg 78.9 kg    Examination: General exam: Appears comfortable  HEENT: PERRLA, oral mucosa moist, no sclera icterus or thrush Respiratory system: Clear to auscultation. Respiratory effort  normal.- chest tube in place Cardiovascular system: S1 & S2 heard, RRR.   Gastrointestinal system: Abdomen soft, non-tender, nondistended. Normal bowel sounds. Central nervous system: Alert and oriented. No focal neurological deficits. Extremities: No cyanosis, clubbing or edema Skin: No rashes or ulcers Psychiatry:  Mood & affect appropriate.     Data Reviewed: I have personally reviewed following labs and imaging studies  CBC: Recent Labs  Lab 07/09/18 0941 07/10/18 0225  WBC 8.4 7.2  HGB 13.5 12.4*  HCT 43.2 38.9*  MCV 88.7 87.4  PLT 231 259   Basic Metabolic Panel: Recent Labs  Lab 07/09/18 1901 07/10/18 0225  NA 139 137  K 4.3 4.1  CL 103 104  CO2 26 25  GLUCOSE 148* 149*  BUN 18 20  CREATININE 1.12 1.23  CALCIUM 9.0 8.7*   GFR: Estimated Creatinine Clearance: 45.9 mL/min (by C-G formula based on SCr of 1.23 mg/dL). Liver Function Tests: No results for input(s): AST, ALT, ALKPHOS, BILITOT, PROT, ALBUMIN in the last 168 hours. No results for input(s): LIPASE, AMYLASE in the last 168 hours. No results for input(s): AMMONIA in the last 168 hours. Coagulation Profile: Recent Labs  Lab 07/09/18 0941  INR 1.17   Cardiac Enzymes: No results for input(s): CKTOTAL, CKMB, CKMBINDEX, TROPONINI in the last 168 hours. BNP (last 3 results) No results for input(s): PROBNP in  the last 8760 hours. HbA1C: No results for input(s): HGBA1C in the last 72 hours. CBG: No results for input(s): GLUCAP in the last 168 hours. Lipid Profile: No results for input(s): CHOL, HDL, LDLCALC, TRIG, CHOLHDL, LDLDIRECT in the last 72 hours. Thyroid Function Tests: No results for input(s): TSH, T4TOTAL, FREET4, T3FREE, THYROIDAB in the last 72 hours. Anemia Panel: No results for input(s): VITAMINB12, FOLATE, FERRITIN, TIBC, IRON, RETICCTPCT in the last 72 hours. Urine analysis: @LABRCNTIP (procalcitonin:4,lacticidven:4) )No results found for this or any previous visit (from the past  240 hour(s)).    Radiology Studies: Ct Biopsy  Result Date: 07/23/2018 INDICATION: 85 year old with a suspicious lesion in the left upper lobe. History of colon cancer. Plan for CT-guided left upper lobe nodule biopsy. EXAM: CT-GUIDED BIOPSY OF LEFT UPPER LOBE PULMONARY NODULE CT-GUIDED PLACEMENT OF LEFT CHEST TUBE MEDICATIONS: None. ANESTHESIA/SEDATION: Moderate (conscious) sedation was employed during this procedure. A total of Versed 1.5 mg and Fentanyl 50 mcg was administered intravenously. Moderate Sedation Time: 32 minutes. The patient's level of consciousness and vital signs were monitored continuously by radiology nursing throughout the procedure under my direct supervision. FLUOROSCOPY TIME:  None COMPLICATIONS: Pneumothorax that required chest tube placement. PROCEDURE: Informed written consent was obtained from the patient after a thorough discussion of the procedural risks, benefits and alternatives. All questions were addressed. A timeout was performed prior to the initiation of the procedure. Patient was placed supine. CT images through the chest were obtained. Small lesion in the periphery of the left upper lobe was identified and targeted. The anterior chest was prepped with chlorhexidine and sterile field was created. Maximal barrier sterile technique was utilized including caps, mask, sterile gowns, sterile gloves, sterile drape, hand hygiene and skin antiseptic. Skin was anesthetized with 1% lidocaine. A 17 gauge coaxial needle was directed into the left lung with CT guidance and there was an immediate left pneumothorax. Left pneumothorax continued to enlarge over a few minutes although the patient was asymptomatic. Decision was made to place a chest tube. Amplatz wire was advanced through the existing needle and the tract was dilated to accommodate a 10.2 [place] multipurpose drain. Drain was attached to a PleurEvac and wall suction. After placement to suction, the pneumothorax resolved. A  17 gauge needle was then directed slightly lateral to the existing chest tube and into the lesion with CT guidance. Two core biopsies were obtained with an 18 gauge core device. Specimens placed in formalin. Needle was removed without complication. The chest tube was sutured to the skin. FINDINGS: Small irregular opacity in the periphery of the left upper lobe. Patient immediately developed a pneumothorax following placement of the needle into the left lung. The pneumothorax was enlarging over a few minutes although the patient remained asymptomatic. Chest tube was required in order to perform the biopsy. Chest tube successfully placed along the anterior left lung. Biopsy needle was confirmed within the lesion. Two core biopsies were obtained. At the end of the procedure, there was minimal parenchymal hemorrhage around the lesion. Pneumothorax had essentially resolved with the left anterior chest tube. IMPRESSION: CT-guided biopsy of the left upper lobe lung lesion. Procedure was complicated by a pneumothorax. Pneumothorax was successfully treated with a CT-guided chest tube placement. Patient will be admitted for chest tube management. Electronically Signed   By: Markus Daft M.D.   On: 2018/07/23 16:35   Dg Chest Port 1 View  Result Date: 07/10/2018 CLINICAL DATA:  Pneumothorax after biopsy EXAM: PORTABLE CHEST - 1 VIEW COMPARISON:  the  previous day's study FINDINGS: The left pigtail chest tube has retracted somewhat, sideholes still projecting within the left hemithorax. No convincing residual apical pneumothorax visualized on this portable exam. Some mild subsegmental atelectasis in the lung bases. Heart size and mediastinal contours are within normal limits. Aortic Atherosclerosis (ICD10-170.0). Coronary stents. No effusion. Visualized bones unremarkable. IMPRESSION: 1. Partial retraction of chest tube.  No definite pneumothorax. Electronically Signed   By: Lucrezia Europe M.D.   On: 07/10/2018 09:03   Dg Chest  Port 1 View  Result Date: 07/09/2018 CLINICAL DATA:  Left chest tube. EXAM: PORTABLE CHEST 1 VIEW COMPARISON:  08/08/2017.  Chest CT for biopsy earlier today. FINDINGS: Left chest tube is in place. Tiny left apical pneumothorax. Heart is normal size. Coronary artery stents noted. No confluent airspace opacities or effusions. IMPRESSION: Left chest tube in place with tiny left apical pneumothorax. Electronically Signed   By: Rolm Baptise M.D.   On: 07/09/2018 16:57   Ct Perc Pleural Drain W/indwell Cath W/img Guide  Result Date: 07/09/2018 INDICATION: 85 year old with a suspicious lesion in the left upper lobe. History of colon cancer. Plan for CT-guided left upper lobe nodule biopsy. EXAM: CT-GUIDED BIOPSY OF LEFT UPPER LOBE PULMONARY NODULE CT-GUIDED PLACEMENT OF LEFT CHEST TUBE MEDICATIONS: None. ANESTHESIA/SEDATION: Moderate (conscious) sedation was employed during this procedure. A total of Versed 1.5 mg and Fentanyl 50 mcg was administered intravenously. Moderate Sedation Time: 32 minutes. The patient's level of consciousness and vital signs were monitored continuously by radiology nursing throughout the procedure under my direct supervision. FLUOROSCOPY TIME:  None COMPLICATIONS: Pneumothorax that required chest tube placement. PROCEDURE: Informed written consent was obtained from the patient after a thorough discussion of the procedural risks, benefits and alternatives. All questions were addressed. A timeout was performed prior to the initiation of the procedure. Patient was placed supine. CT images through the chest were obtained. Small lesion in the periphery of the left upper lobe was identified and targeted. The anterior chest was prepped with chlorhexidine and sterile field was created. Maximal barrier sterile technique was utilized including caps, mask, sterile gowns, sterile gloves, sterile drape, hand hygiene and skin antiseptic. Skin was anesthetized with 1% lidocaine. A 17 gauge coaxial  needle was directed into the left lung with CT guidance and there was an immediate left pneumothorax. Left pneumothorax continued to enlarge over a few minutes although the patient was asymptomatic. Decision was made to place a chest tube. Amplatz wire was advanced through the existing needle and the tract was dilated to accommodate a 10.2 [place] multipurpose drain. Drain was attached to a PleurEvac and wall suction. After placement to suction, the pneumothorax resolved. A 17 gauge needle was then directed slightly lateral to the existing chest tube and into the lesion with CT guidance. Two core biopsies were obtained with an 18 gauge core device. Specimens placed in formalin. Needle was removed without complication. The chest tube was sutured to the skin. FINDINGS: Small irregular opacity in the periphery of the left upper lobe. Patient immediately developed a pneumothorax following placement of the needle into the left lung. The pneumothorax was enlarging over a few minutes although the patient remained asymptomatic. Chest tube was required in order to perform the biopsy. Chest tube successfully placed along the anterior left lung. Biopsy needle was confirmed within the lesion. Two core biopsies were obtained. At the end of the procedure, there was minimal parenchymal hemorrhage around the lesion. Pneumothorax had essentially resolved with the left anterior chest tube. IMPRESSION: CT-guided  biopsy of the left upper lobe lung lesion. Procedure was complicated by a pneumothorax. Pneumothorax was successfully treated with a CT-guided chest tube placement. Patient will be admitted for chest tube management. Electronically Signed   By: Markus Daft M.D.   On: 07/09/2018 16:35      Scheduled Meds: . atorvastatin  40 mg Oral Daily  . docusate sodium  100 mg Oral BID  . enoxaparin (LOVENOX) injection  40 mg Subcutaneous Q24H  . lisinopril  2.5 mg Oral Daily  . metoprolol succinate  75 mg Oral Daily  .  pantoprazole  40 mg Oral BID AC   Continuous Infusions:   LOS: 0 days    Time spent in minutes: 30    Debbe Odea, MD [facility] Pager: [email or web address] Password [email or web address] 07/10/2018, 2:16 PM

## 2018-07-10 NOTE — Telephone Encounter (Signed)
Agree. EGD is elective and should only be scheduled and performed when appropriate.

## 2018-07-10 NOTE — Plan of Care (Signed)
  Problem: Clinical Measurements: Goal: Postoperative complications will be avoided or minimized Outcome: Progressing   

## 2018-07-10 NOTE — Plan of Care (Signed)
  Problem: Clinical Measurements: Goal: Postoperative complications will be avoided or minimized 07/10/2018 0458 by Fransisco Hertz, RN Outcome: Progressing 07/10/2018 [place] by Fransisco Hertz, RN Outcome: Progressing

## 2018-07-10 NOTE — Progress Notes (Signed)
ANTICOAGULATION CONSULT NOTE - Follow Up Consult  Pharmacy Consult for Lovenox Indication: atrial fibrillation  Allergies  Allergen Reactions  . Gadolinium Derivatives Hives, Itching and Other (See Comments)    Dr. Jeralyn Ruths s/w [name]. We observed him for 15 minutes. He did not need to take Benadryl. The symptoms began to subside before he left.     Patient Measurements: Height: 5\' 11"  (180.3 cm) Weight: 174 lb (78.9 kg) IBW/kg (Calculated) : 75.3  Vital Signs: Temp: 98 F (36.7 C) (01/01 0608) Temp Source: Oral (01/01 0608) BP: 141/65 (01/01 0608) Pulse Rate: 74 (01/01 0608)  Labs: Recent Labs    07/09/18 0941 07/09/18 1901 07/10/18 0225  HGB 13.5  --  12.4*  HCT 43.2  --  38.9*  PLT 231  --  198  LABPROT 14.8  --   --   INR 1.17  --   --   CREATININE  --  1.12 1.23    Estimated Creatinine Clearance: 45.9 mL/min (by C-G formula based on SCr of 1.23 mg/dL).  Assessment: 85 year old male on warfarin prior to admission for Afib.  Warfarin continues to remain on hold, bridging on Lovenox until then  Goal of Therapy:  Anti-Xa level 0.6-1 units/ml 4hrs after LMWH dose given Monitor platelets by anticoagulation protocol: Yes   Plan:  Lovenox 80 mg sq Q 12 hours Continue to follow   Thank you Anette Guarneri, PharmD [phone]  07/10/2018,2:36 PM

## 2018-07-11 ENCOUNTER — Inpatient Hospital Stay (HOSPITAL_COMMUNITY): Payer: Medicare Other

## 2018-07-11 ENCOUNTER — Encounter (HOSPITAL_COMMUNITY): Payer: Self-pay

## 2018-07-11 NOTE — Progress Notes (Signed)
Patient accidentally stepped on chest tube and it is now out. Vaseline gauze placed. MD notified and IR notified.

## 2018-07-11 NOTE — Progress Notes (Signed)
PROGRESS NOTE    [name]   CVE:[id number]  DOB: [birth year]  DOA: 07/09/2018 PCP: Marton Redwood, MD   Brief Narrative:  [name]   is a 85 y.o. male with medical history significant of PVD; CAD; HTN; HLD; CKD; and afib.   He came to have a lung biopsy by IR and subsequently developed a pneumothorax. A chest tube was placed and [facility] were asked to admit.    Subjective: No complaints today.   Assessment & Plan:   Principal Problem:   Pneumothorax of left lung after biopsy - management per IR- repeat CXR on 1/1 show resolved pneumothorax but still has air leak  Active Problems:  Paroxysmal  Atrial fibrillation  - resumed Coumadin per pharmacy yesterday - it was on hold for his biopsy - continue Metoprolol    Lung nodule- PET positive - f/u on biopsy    Cancer of sigmoid colon s/p sigmoid colectomy 12/26/2017   DVT prophylaxis: SCDs, ambulating Code Status: DNR Family Communication:  Disposition Plan: home when PTX resolves Consultants:   IR Procedures:   Chest tube Antimicrobials:  Anti-infectives (From admission, onward)   None       Objective: Vitals:   07/10/18 2058 07/11/18 0553 07/11/18 1001 07/11/18 1328  BP: 130/74 (!) 115/58 137/63 (!) 115/50  Pulse: 70 (!) 54 62 61  Resp: 20 20  18   Temp: 99.4 F (37.4 C) 98.2 F (36.8 C)  98.9 F (37.2 C)  TempSrc: Oral Oral  Oral  SpO2: 97% 98%  98%  Weight:      Height:        Intake/Output Summary (Last 24 hours) at 07/11/2018 1434 Last data filed at 07/11/2018 1325 Gross per 24 hour  Intake 600 ml  Output 690 ml  Net -90 ml   Filed Weights   07/09/18 0918 07/09/18 1615  Weight: 78.9 kg 78.9 kg    Examination: General exam: Appears comfortable  HEENT: PERRLA, oral mucosa moist, no sclera icterus or thrush Respiratory system: Clear to auscultation. Respiratory effort normal. Left Chest tube in place Cardiovascular system: S1 & S2 heard,  No murmurs  Gastrointestinal  system: Abdomen soft, non-tender, nondistended. Normal bowel sound. No organomegaly Central nervous system: Alert and oriented. No focal neurological deficits. Extremities: No cyanosis, clubbing or edema Skin: No rashes or ulcers Psychiatry:  Mood & affect appropriate. .     Data Reviewed: I have personally reviewed following labs and imaging studies  CBC: Recent Labs  Lab 07/09/18 0941 07/10/18 0225  WBC 8.4 7.2  HGB 13.5 12.4*  HCT 43.2 38.9*  MCV 88.7 87.4  PLT 231 025   Basic Metabolic Panel: Recent Labs  Lab 07/09/18 1901 07/10/18 0225  NA 139 137  K 4.3 4.1  CL 103 104  CO2 26 25  GLUCOSE 148* 149*  BUN 18 20  CREATININE 1.12 1.23  CALCIUM 9.0 8.7*   GFR: Estimated Creatinine Clearance: 45.9 mL/min (by C-G formula based on SCr of 1.23 mg/dL). Liver Function Tests: No results for input(s): AST, ALT, ALKPHOS, BILITOT, PROT, ALBUMIN in the last 168 hours. No results for input(s): LIPASE, AMYLASE in the last 168 hours. No results for input(s): AMMONIA in the last 168 hours. Coagulation Profile: Recent Labs  Lab 07/09/18 0941  INR 1.17   Cardiac Enzymes: No results for input(s): CKTOTAL, CKMB, CKMBINDEX, TROPONINI in the last 168 hours. BNP (last 3 results) No results for input(s): PROBNP in the last 8760 hours. HbA1C: No results  for input(s): HGBA1C in the last 72 hours. CBG: No results for input(s): GLUCAP in the last 168 hours. Lipid Profile: No results for input(s): CHOL, HDL, LDLCALC, TRIG, CHOLHDL, LDLDIRECT in the last 72 hours. Thyroid Function Tests: No results for input(s): TSH, T4TOTAL, FREET4, T3FREE, THYROIDAB in the last 72 hours. Anemia Panel: No results for input(s): VITAMINB12, FOLATE, FERRITIN, TIBC, IRON, RETICCTPCT in the last 72 hours. Urine analysis: @LABRCNTIP (procalcitonin:4,lacticidven:4) )No results found for this or any previous visit (from the past 240 hour(s)).    Radiology Studies: Dg Chest Port 1 View  Result Date:  07/10/2018 CLINICAL DATA:  Pneumothorax after biopsy EXAM: PORTABLE CHEST - 1 VIEW COMPARISON:  the previous day's study FINDINGS: The left pigtail chest tube has retracted somewhat, sideholes still projecting within the left hemithorax. No convincing residual apical pneumothorax visualized on this portable exam. Some mild subsegmental atelectasis in the lung bases. Heart size and mediastinal contours are within normal limits. Aortic Atherosclerosis (ICD10-170.0). Coronary stents. No effusion. Visualized bones unremarkable. IMPRESSION: 1. Partial retraction of chest tube.  No definite pneumothorax. Electronically Signed   By: Lucrezia Europe M.D.   On: 07/10/2018 09:03   Dg Chest Port 1 View  Result Date: 07/09/2018 CLINICAL DATA:  Left chest tube. EXAM: PORTABLE CHEST 1 VIEW COMPARISON:  08/08/2017.  Chest CT for biopsy earlier today. FINDINGS: Left chest tube is in place. Tiny left apical pneumothorax. Heart is normal size. Coronary artery stents noted. No confluent airspace opacities or effusions. IMPRESSION: Left chest tube in place with tiny left apical pneumothorax. Electronically Signed   By: Rolm Baptise M.D.   On: 07/09/2018 16:57      Scheduled Meds: . atorvastatin  40 mg Oral Daily  . docusate sodium  100 mg Oral BID  . enoxaparin (LOVENOX) injection  80 mg Subcutaneous Q12H  . lisinopril  2.5 mg Oral Daily  . metoprolol succinate  75 mg Oral Daily  . pantoprazole  40 mg Oral BID AC   Continuous Infusions:   LOS: 1 day    Time spent in minutes: 30    Debbe Odea, MD [facility] Pager: [email or web address] Password [email or web address] 07/11/2018, 2:34 PM

## 2018-07-11 NOTE — Progress Notes (Signed)
Referring Physician(s): Grace Isaac  Supervising Physician: Marybelle Killings  Patient Status:  [facility] - In-pt  Chief Complaint:  Left lung bx 12/31 Post PTX - chest tube placement  Subjective:  Adm to [facility] Pt up in chair-- eating solid meal No complaints Did have a one time productive cough of small amount mucus with drop of blood None since  CXR yesterday: 1. Partial retraction of chest tube.  No definite pneumothorax.   Allergies: Gadolinium derivatives  Medications: Prior to Admission medications   Medication Sig Start Date End Date Taking? Authorizing Provider  atorvastatin (LIPITOR) 40 MG tablet TAKE ONE TABLET DAILY AT 6PM Patient taking differently: Take 40 mg by mouth daily.  11/13/17  Yes Burnell Blanks, MD  clopidogrel (PLAVIX) 75 MG tablet TAKE ONE TABLET EACH DAY Patient taking differently: Take 75 mg by mouth daily.  09/03/17  Yes Burnell Blanks, MD  lisinopril (PRINIVIL,ZESTRIL) 5 MG tablet Take 0.5 tablets (2.5 mg total) by mouth daily. 03/13/18  Yes Burnell Blanks, MD  metoprolol succinate (TOPROL-XL) 25 MG 24 hr tablet Take 3 tablets (75 mg total) by mouth daily. 01/07/18  Yes Burnell Blanks, MD  vitamin B-12 (CYANOCOBALAMIN) 1000 MCG tablet Take 1,000 mcg by mouth daily.   Yes [provider]  warfarin (COUMADIN) 5 MG tablet TAKE AS DIRECTED BY COUMADIN CLINIC Patient taking differently: Take 5-7.5 mg by mouth See admin instructions. Take as directed by coumadin clinic; 7.5 mg (5 mg x 1.5) every Fri; 5 mg (5 mg x 1) all other days 06/28/18  Yes Burnell Blanks, MD     Vital Signs: BP (!) 115/58 (BP Location: Left Arm)   Pulse (!) 54   Temp 98.2 F (36.8 C) (Oral)   Resp 20   Ht 5\' 11"  (1.803 m)   Wt 174 lb (78.9 kg)   SpO2 98%   BMI 24.27 kg/m   Physical Exam Vitals signs reviewed.  Pulmonary:     Effort: Pulmonary effort is normal.     Breath sounds: Normal breath sounds.  Skin:    General:  Skin is warm and dry.     Comments: Site of chest tube is clean and dry NT no bleeding + air leak at chest tube chamber     Imaging: Ct Biopsy  Result Date: 07/09/2018 INDICATION: 85 year old with a suspicious lesion in the left upper lobe. History of colon cancer. Plan for CT-guided left upper lobe nodule biopsy. EXAM: CT-GUIDED BIOPSY OF LEFT UPPER LOBE PULMONARY NODULE CT-GUIDED PLACEMENT OF LEFT CHEST TUBE MEDICATIONS: None. ANESTHESIA/SEDATION: Moderate (conscious) sedation was employed during this procedure. A total of Versed 1.5 mg and Fentanyl 50 mcg was administered intravenously. Moderate Sedation Time: 32 minutes. The patient's level of consciousness and vital signs were monitored continuously by radiology nursing throughout the procedure under my direct supervision. FLUOROSCOPY TIME:  None COMPLICATIONS: Pneumothorax that required chest tube placement. PROCEDURE: Informed written consent was obtained from the patient after a thorough discussion of the procedural risks, benefits and alternatives. All questions were addressed. A timeout was performed prior to the initiation of the procedure. Patient was placed supine. CT images through the chest were obtained. Small lesion in the periphery of the left upper lobe was identified and targeted. The anterior chest was prepped with chlorhexidine and sterile field was created. Maximal barrier sterile technique was utilized including caps, mask, sterile gowns, sterile gloves, sterile drape, hand hygiene and skin antiseptic. Skin was anesthetized with 1% lidocaine. A 17  gauge coaxial needle was directed into the left lung with CT guidance and there was an immediate left pneumothorax. Left pneumothorax continued to enlarge over a few minutes although the patient was asymptomatic. Decision was made to place a chest tube. Amplatz wire was advanced through the existing needle and the tract was dilated to accommodate a 10.2 [place] multipurpose drain. Drain  was attached to a PleurEvac and wall suction. After placement to suction, the pneumothorax resolved. A 17 gauge needle was then directed slightly lateral to the existing chest tube and into the lesion with CT guidance. Two core biopsies were obtained with an 18 gauge core device. Specimens placed in formalin. Needle was removed without complication. The chest tube was sutured to the skin. FINDINGS: Small irregular opacity in the periphery of the left upper lobe. Patient immediately developed a pneumothorax following placement of the needle into the left lung. The pneumothorax was enlarging over a few minutes although the patient remained asymptomatic. Chest tube was required in order to perform the biopsy. Chest tube successfully placed along the anterior left lung. Biopsy needle was confirmed within the lesion. Two core biopsies were obtained. At the end of the procedure, there was minimal parenchymal hemorrhage around the lesion. Pneumothorax had essentially resolved with the left anterior chest tube. IMPRESSION: CT-guided biopsy of the left upper lobe lung lesion. Procedure was complicated by a pneumothorax. Pneumothorax was successfully treated with a CT-guided chest tube placement. Patient will be admitted for chest tube management. Electronically Signed   By: Markus Daft M.D.   On: 07/09/2018 16:35   Dg Chest Port 1 View  Result Date: 07/10/2018 CLINICAL DATA:  Pneumothorax after biopsy EXAM: PORTABLE CHEST - 1 VIEW COMPARISON:  the previous day's study FINDINGS: The left pigtail chest tube has retracted somewhat, sideholes still projecting within the left hemithorax. No convincing residual apical pneumothorax visualized on this portable exam. Some mild subsegmental atelectasis in the lung bases. Heart size and mediastinal contours are within normal limits. Aortic Atherosclerosis (ICD10-170.0). Coronary stents. No effusion. Visualized bones unremarkable. IMPRESSION: 1. Partial retraction of chest tube.  No  definite pneumothorax. Electronically Signed   By: Lucrezia Europe M.D.   On: 07/10/2018 09:03   Dg Chest Port 1 View  Result Date: 07/09/2018 CLINICAL DATA:  Left chest tube. EXAM: PORTABLE CHEST 1 VIEW COMPARISON:  08/08/2017.  Chest CT for biopsy earlier today. FINDINGS: Left chest tube is in place. Tiny left apical pneumothorax. Heart is normal size. Coronary artery stents noted. No confluent airspace opacities or effusions. IMPRESSION: Left chest tube in place with tiny left apical pneumothorax. Electronically Signed   By: Rolm Baptise M.D.   On: 07/09/2018 16:57   Ct Perc Pleural Drain W/[place] Cath W/img Guide  Result Date: 07/09/2018 INDICATION: 85 year old with a suspicious lesion in the left upper lobe. History of colon cancer. Plan for CT-guided left upper lobe nodule biopsy. EXAM: CT-GUIDED BIOPSY OF LEFT UPPER LOBE PULMONARY NODULE CT-GUIDED PLACEMENT OF LEFT CHEST TUBE MEDICATIONS: None. ANESTHESIA/SEDATION: Moderate (conscious) sedation was employed during this procedure. A total of Versed 1.5 mg and Fentanyl 50 mcg was administered intravenously. Moderate Sedation Time: 32 minutes. The patient's level of consciousness and vital signs were monitored continuously by radiology nursing throughout the procedure under my direct supervision. FLUOROSCOPY TIME:  None COMPLICATIONS: Pneumothorax that required chest tube placement. PROCEDURE: Informed written consent was obtained from the patient after a thorough discussion of the procedural risks, benefits and alternatives. All questions were addressed. A timeout was performed  prior to the initiation of the procedure. Patient was placed supine. CT images through the chest were obtained. Small lesion in the periphery of the left upper lobe was identified and targeted. The anterior chest was prepped with chlorhexidine and sterile field was created. Maximal barrier sterile technique was utilized including caps, mask, sterile gowns, sterile gloves, sterile  drape, hand hygiene and skin antiseptic. Skin was anesthetized with 1% lidocaine. A 17 gauge coaxial needle was directed into the left lung with CT guidance and there was an immediate left pneumothorax. Left pneumothorax continued to enlarge over a few minutes although the patient was asymptomatic. Decision was made to place a chest tube. Amplatz wire was advanced through the existing needle and the tract was dilated to accommodate a 10.2 [place] multipurpose drain. Drain was attached to a PleurEvac and wall suction. After placement to suction, the pneumothorax resolved. A 17 gauge needle was then directed slightly lateral to the existing chest tube and into the lesion with CT guidance. Two core biopsies were obtained with an 18 gauge core device. Specimens placed in formalin. Needle was removed without complication. The chest tube was sutured to the skin. FINDINGS: Small irregular opacity in the periphery of the left upper lobe. Patient immediately developed a pneumothorax following placement of the needle into the left lung. The pneumothorax was enlarging over a few minutes although the patient remained asymptomatic. Chest tube was required in order to perform the biopsy. Chest tube successfully placed along the anterior left lung. Biopsy needle was confirmed within the lesion. Two core biopsies were obtained. At the end of the procedure, there was minimal parenchymal hemorrhage around the lesion. Pneumothorax had essentially resolved with the left anterior chest tube. IMPRESSION: CT-guided biopsy of the left upper lobe lung lesion. Procedure was complicated by a pneumothorax. Pneumothorax was successfully treated with a CT-guided chest tube placement. Patient will be admitted for chest tube management. Electronically Signed   By: Markus Daft M.D.   On: 07/09/2018 16:35    Labs:  CBC: Recent Labs    12/28/17 0426 12/29/17 0507 07/09/18 0941 07/10/18 0225  WBC 11.2* 8.6 8.4 7.2  HGB 12.5* 11.5* 13.5  12.4*  HCT 38.9* 35.6* 43.2 38.9*  PLT 169 168 231 198    COAGS: Recent Labs    05/30/18 0958 06/13/18 0929 06/27/18 1320 07/09/18 0941  INR 3.3* 4.0* 3.2* 1.17    BMP: Recent Labs    03/12/18 1610 03/27/18 1057 07/09/18 1901 07/10/18 0225  NA 145* 143 139 137  K 3.9 4.2 4.3 4.1  CL 104 103 103 104  CO2 25 26 26 25   GLUCOSE 95 92 148* 149*  BUN 24 25 18 20   CALCIUM 8.9 9.3 9.0 8.7*  CREATININE 1.34* 1.26 1.12 1.23  GFRNONAA 48* 51* 59* 53*  GFRAA 55* 59* >60 >60    LIVER FUNCTION TESTS: No results for input(s): BILITOT, AST, ALT, ALKPHOS, PROT, ALBUMIN in the last 8760 hours.  Assessment and Plan:  Left chest tube placement post PTX after left lung nodule bx on 12/31 Doing well No complaints + air leak Will discuss with Dr Barbie Banner   Electronically Signed: Lavonia Drafts, PA-C 07/11/2018, 8:57 AM   I spent a total of 15 Minutes at the the patient's bedside AND on the patient's hospital floor or unit, greater than 50% of which was counseling/coordinating care for Left chest tube

## 2018-07-11 NOTE — Progress Notes (Signed)
Patient ID: [name], male   DOB: 1929-12-02, 85 y.o.   MRN: [id number]  The patient accidentally pulled out his chest tube while on the commode.  A stat chest x-ray was performed and I evaluated him at the bedside.  He is resting comfortably in bed.  His pulse ox is 95% on room air.  He denies any chest pain or difficulty breathing.  He actually feels better with the chest tube out.  He did have a small air leak noted this morning.  The follow-up chest x-ray demonstrates that the chest tube is out and there is a very small 5% left apical pneumothorax.  I discussed replacing the chest tube with the patient immediately.  It would be difficult given the small pneumothorax and would be performed without sedation.  We both agreed that he will be followed through the night.  A chest x-ray can be performed if he develops respiratory distress and if a large pneumothorax is present, chest tube can be performed.  Otherwise, follow-up chest x-ray will be performed in the a.m.

## 2018-07-12 ENCOUNTER — Encounter: Payer: Medicare Other | Admitting: Internal Medicine

## 2018-07-12 ENCOUNTER — Inpatient Hospital Stay (HOSPITAL_COMMUNITY): Payer: Medicare Other

## 2018-07-12 DIAGNOSIS — I4819 Other persistent atrial fibrillation: Secondary | ICD-10-CM

## 2018-07-12 DIAGNOSIS — J9601 Acute respiratory failure with hypoxia: Secondary | ICD-10-CM | POA: Diagnosis not present

## 2018-07-12 DIAGNOSIS — Z9689 Presence of other specified functional implants: Secondary | ICD-10-CM

## 2018-07-12 MED ORDER — IPRATROPIUM-ALBUTEROL 0.5-2.5 (3) MG/3ML IN SOLN
3.0000 mL | Freq: Three times a day (TID) | RESPIRATORY_TRACT | Status: DC
  Administered 2018-07-12 – 2018-07-15 (×8): 3 mL via RESPIRATORY_TRACT
  Filled 2018-07-12 (×8): qty 3

## 2018-07-12 MED ORDER — WARFARIN - PHARMACIST DOSING INPATIENT: Freq: Every day | Status: DC

## 2018-07-12 MED ORDER — IPRATROPIUM-ALBUTEROL 0.5-2.5 (3) MG/3ML IN SOLN
3.0000 mL | Freq: Four times a day (QID) | RESPIRATORY_TRACT | Status: DC
  Administered 2018-07-12: 3 mL via RESPIRATORY_TRACT
  Filled 2018-07-12: qty 3

## 2018-07-12 MED ORDER — GUAIFENESIN-DM 100-10 MG/5ML PO SYRP
5.0000 mL | ORAL_SOLUTION | ORAL | Status: DC | PRN
  Administered 2018-07-12 (×2): 5 mL via ORAL
  Filled 2018-07-12 (×2): qty 5

## 2018-07-12 MED ORDER — WARFARIN SODIUM 7.5 MG PO TABS
7.5000 mg | ORAL_TABLET | Freq: Once | ORAL | Status: AC
  Administered 2018-07-12: 7.5 mg via ORAL
  Filled 2018-07-12: qty 1

## 2018-07-12 MED ORDER — GUAIFENESIN ER 600 MG PO TB12
600.0000 mg | ORAL_TABLET | Freq: Two times a day (BID) | ORAL | Status: DC
  Administered 2018-07-12 – 2018-07-16 (×9): 600 mg via ORAL
  Filled 2018-07-12 (×9): qty 1

## 2018-07-12 NOTE — Progress Notes (Signed)
SATURATION QUALIFICATIONS: (This note is used to comply with regulatory documentation for home oxygen)  Patient Saturations on Room Air at Rest = 91%  Patient Saturations on Room Air while Ambulating = 85%  Patient Saturations on 0 Liters of oxygen while Ambulating = 84%  Please briefly explain why patient needs home oxygen:

## 2018-07-12 NOTE — Progress Notes (Signed)
ANTICOAGULATION CONSULT NOTE - Follow Up Consult  Pharmacy Consult for Lovenox and Coumadin Indication: atrial fibrillation  Allergies  Allergen Reactions  . Gadolinium Derivatives Hives, Itching and Other (See Comments)    Dr. Jeralyn Ruths s/w [name]. We observed him for 15 minutes. He did not need to take Benadryl. The symptoms began to subside before he left.     Patient Measurements: Height: 5\' 11"  (180.3 cm) Weight: 174 lb (78.9 kg) IBW/kg (Calculated) : 75.3  Vital Signs: Temp: 98.3 F (36.8 C) (01/03 1326) Temp Source: Oral (01/03 1326) BP: 149/70 (01/03 1326) Pulse Rate: 78 (01/03 1326)  Labs: Recent Labs    07/09/18 1901 07/10/18 0225  HGB  --  12.4*  HCT  --  38.9*  PLT  --  198  CREATININE 1.12 1.23    Estimated Creatinine Clearance: 45.9 mL/min (by C-G formula based on SCr of 1.23 mg/dL).  Assessment: 85 year old male on Coumadin 5mg  daily PTA exc 7.5mg  on Fri PTA for Afib. Admit INR 1.17. Coumadin held for lung biopsy, then developed pneumothorax requiring tube placement. Minimal output yesterday. Chest tube still in place. Lovenox started. Hgb 12.4, plts wnl.  Restarting Coumadin today.  Goal of Therapy:  Anti-Xa level 0.6-1 units/ml 4hrs after LMWH dose given Monitor platelets by anticoagulation protocol: Yes   Plan:  Continue enoxaparin 80mg  [place] Q12h Give Coumadin 7.5mg  PO x 1 tonight Monitor daily INR, CBC, s/s of bleed  Elenor Quinones, PharmD, BCPS, [facility] Clinical Pharmacist Phone number [phone] 07/12/2018 1:56 PM

## 2018-07-12 NOTE — Progress Notes (Addendum)
PROGRESS NOTE    [name]   ZOX:[id number]  DOB: 1930/05/01  DOA: 07/09/2018 PCP: Marton Redwood, MD   Brief Narrative:  [name]   is a 85 y.o. male with medical history significant of PVD; CAD; HTN; HLD; CKD; and afib.   He came to have a lung biopsy by IR done on 07/09/18, and subsequently developed a pneumothorax. A chest tube was placed by IR same day, and [facility] were asked to admit.    Subjective: Chest tube accidentally pulled out overnight.  CXR last night after CT out showed small PTX of 5% >  IR saw patient at 5 pm and felt ok to keep it out.  Repeat CXR this am is unchanged.  Pt got up w/ tech and ambulated , SpO2 dropped into 88% range w/ walking on RA.  Pt states chest pain is much better w/ tube out.    Home meds:  - lisinopril 2.5 qd/ metoprolol xl 75 qd  - atorvastatin 40 qd/ clopidogrel 75 qd  - warfarin 5mg  qd except 7.5 on friday    Assessment & Plan:    Pneumothorax of left lung after biopsy - management per IR - repeat CXR daily - still hypoxemic, not other etiology on exam noted - pt did have 40 pk yrs tobacco use, quit in his 36's - slightly wheezy, will start Duoneb scheduled and hopefully SpO2 will improve over next 24-48hrs   Acute hypoxemic resp failure: as above   Paroxysmal  Atrial fibrillation: CHADsVASC2 = 4 - resumed Coumadin per pharmacy- it was on hold for his biopsy - continue Metoprolol, pt in NSR here   CAD without angina: severe 2V CAD hx recurrent stents in LAD and LCx. Will continue Plavix, statin and beta blocker.     HTN: BP is controlled. No changes   Ischemic cardiomyopathy: EF 40% in 07/2017 improved to 60% when checked in Sept 2019, prob due to revasc / PCI procedures done in January . Continue the beta blocker. His Ace-inh was held      Lung nodule- PET positive - f/u on biopsy results from 07/09/18    Cancer of sigmoid colon s/p sigmoid colectomy 12/26/2017   DVT prophylaxis: SCDs,  ambulating Code Status: DNR Family Communication: none present Disposition Plan: home when PTX resolves and SpO2 walking > 90%  Kelly Splinter MD [facility] pgr [phone] 07/12/2018, 12:32 PM   Consultants:   IR Procedures:   Chest tube Antimicrobials:  Anti-infectives (From admission, onward)   None       Objective: Vitals:   07/11/18 2118 07/12/18 0451 07/12/18 1040 07/12/18 1108  BP: 135/69 (!) 144/56  138/86  Pulse: 63 60  79  Resp: 18 20    Temp: 98.1 F (36.7 C) 97.8 F (36.6 C)    TempSrc: Oral Oral    SpO2: 96% 96% 96%   Weight:      Height:        Intake/Output Summary (Last 24 hours) at 07/12/2018 1227 Last data filed at 07/12/2018 1059 Gross per 24 hour  Intake 600 ml  Output 725 ml  Net -125 ml   Filed Weights   07/09/18 0918 07/09/18 1615  Weight: 78.9 kg 78.9 kg    Examination: General exam: Appears comfortable  HEENT: PERRLA, oral mucosa moist, no sclera icterus or thrush Respiratory system: Clear to auscultation. Respiratory effort normal. Left Chest tube in place Cardiovascular system: S1 & S2 heard,  No murmurs  Gastrointestinal  system: Abdomen soft, non-tender, nondistended. Normal bowel sound. No organomegaly Central nervous system: Alert and oriented. No focal neurological deficits. Extremities: No cyanosis, clubbing or edema Skin: No rashes or ulcers Psychiatry:  Mood & affect appropriate. .     Data Reviewed: I have personally reviewed following labs and imaging studies  CBC: Recent Labs  Lab 07/09/18 0941 07/10/18 0225  WBC 8.4 7.2  HGB 13.5 12.4*  HCT 43.2 38.9*  MCV 88.7 87.4  PLT 231 284   Basic Metabolic Panel: Recent Labs  Lab 07/09/18 1901 07/10/18 0225  NA 139 137  K 4.3 4.1  CL 103 104  CO2 26 25  GLUCOSE 148* 149*  BUN 18 20  CREATININE 1.12 1.23  CALCIUM 9.0 8.7*   GFR: Estimated Creatinine Clearance: 45.9 mL/min (by C-G formula based on SCr of 1.23 mg/dL). Liver Function Tests: No  results for input(s): AST, ALT, ALKPHOS, BILITOT, PROT, ALBUMIN in the last 168 hours. No results for input(s): LIPASE, AMYLASE in the last 168 hours. No results for input(s): AMMONIA in the last 168 hours. Coagulation Profile: Recent Labs  Lab 07/09/18 0941  INR 1.17   Cardiac Enzymes: No results for input(s): CKTOTAL, CKMB, CKMBINDEX, TROPONINI in the last 168 hours. BNP (last 3 results) No results for input(s): PROBNP in the last 8760 hours. HbA1C: No results for input(s): HGBA1C in the last 72 hours. CBG: No results for input(s): GLUCAP in the last 168 hours. Lipid Profile: No results for input(s): CHOL, HDL, LDLCALC, TRIG, CHOLHDL, LDLDIRECT in the last 72 hours. Thyroid Function Tests: No results for input(s): TSH, T4TOTAL, FREET4, T3FREE, THYROIDAB in the last 72 hours. Anemia Panel: No results for input(s): VITAMINB12, FOLATE, FERRITIN, TIBC, IRON, RETICCTPCT in the last 72 hours. Urine analysis: @LABRCNTIP (procalcitonin:4,lacticidven:4) )No results found for this or any previous visit (from the past 240 hour(s)).    Radiology Studies: Dg Chest Port 1 View  Result Date: 07/12/2018 CLINICAL DATA:  Left pneumothorax. EXAM: PORTABLE CHEST 1 VIEW COMPARISON:  Radiograph of July 11, 2018. FINDINGS: Stable cardiomediastinal silhouette. Atherosclerosis of thoracic aorta is noted. Right lung is clear. Stable mild left apical pneumothorax is noted with minimal left basilar component. Mild left basilar subsegmental atelectasis is noted. Mild amount of subcutaneous emphysema seen over left lateral chest wall left supraclavicular region. Bony thorax is unremarkable. IMPRESSION: Mild left apical pneumothorax with minimal left basilar component. Subcutaneous emphysema is seen over left lateral chest wall supraclavicular region. Aortic Atherosclerosis (ICD10-I70.0). Electronically Signed   By: Marijo Conception, M.D.   On: 07/12/2018 08:19   Dg Chest Port 1 View  Result Date:  07/11/2018 CLINICAL DATA:  Follow-up left pneumothorax. EXAM: PORTABLE CHEST 1 VIEW COMPARISON:  Chest x-ray from yesterday. FINDINGS: Interval removal of the left pigtail chest tube with recurrent small left pneumothorax. The heart size and mediastinal contours are within normal limits. Normal pulmonary vascularity. No consolidation or pleural effusion. No acute osseous abnormality. Subcutaneous emphysema in the left chest wall. IMPRESSION: 1. Recurrent small left pneumothorax. These results will be called to the ordering clinician or representative by the Radiologist Assistant, and communication documented in the PACS or zVision Dashboard. Electronically Signed   By: Titus Dubin M.D.   On: 07/11/2018 17:00      Scheduled Meds: . atorvastatin  40 mg Oral Daily  . docusate sodium  100 mg Oral BID  . enoxaparin (LOVENOX) injection  80 mg Subcutaneous Q12H  . guaiFENesin  600 mg Oral BID  . ipratropium-albuterol  3  mL Nebulization Q6H  . lisinopril  2.5 mg Oral Daily  . metoprolol succinate  75 mg Oral Daily  . pantoprazole  40 mg Oral BID AC   Continuous Infusions:   LOS: 2 days    Time spent in minutes: Bush, MD [facility] Pager: [email or web address] Password [facility] 07/12/2018, 12:27 PM

## 2018-07-12 NOTE — Progress Notes (Signed)
Chief Complaint: Patient was seen today for follow up (L)PTX  Referring Physician(s): Grace Isaac  Supervising Physician: Aletta Edouard  Patient Status: [facility] - In-pt  Subjective: Chest tube inadvertently pulled yesterday. CXR has been stable. Pt feeling okay, denies SOB, but O2 sats do drop into the 80s    Objective: Physical Exam: BP (!) 149/70 (BP Location: Right Arm)   Pulse 78   Temp 98.3 F (36.8 C) (Oral)   Resp 18   Ht 5\' 11"  (1.803 m)   Wt 78.9 kg   SpO2 96%   BMI 24.27 kg/m  (L)ant chest site clean.    Current Facility-Administered Medications:  .  acetaminophen (TYLENOL) tablet 650 mg, 650 mg, Oral, Q6H PRN **OR** acetaminophen (TYLENOL) suppository 650 mg, 650 mg, Rectal, Q6H PRN, Karmen Bongo, MD .  atorvastatin (LIPITOR) tablet 40 mg, 40 mg, Oral, Daily, Karmen Bongo, MD, 40 mg at 07/12/18 1109 .  docusate sodium (COLACE) capsule 100 mg, 100 mg, Oral, BID, Karmen Bongo, MD, 100 mg at 07/12/18 1108 .  enoxaparin (LOVENOX) injection 80 mg, 80 mg, Subcutaneous, Q12H, Rizwan, Saima, MD, 80 mg at 07/11/18 0359 .  fentaNYL (SUBLIMAZE) injection, , Intravenous, PRN, Markus Daft, MD, 25 mcg at 07/09/18 1215 .  guaiFENesin (MUCINEX) 12 hr tablet 600 mg, 600 mg, Oral, BID, Roney Jaffe, MD .  guaiFENesin-dextromethorphan (ROBITUSSIN DM) 100-10 MG/5ML syrup 5 mL, 5 mL, Oral, Q4H PRN, Debbe Odea, MD, 5 mL at 07/12/18 0311 .  ipratropium-albuterol (DUONEB) 0.5-2.5 (3) MG/3ML nebulizer solution 3 mL, 3 mL, Nebulization, Q6H, Roney Jaffe, MD .  lisinopril (PRINIVIL,ZESTRIL) tablet 2.5 mg, 2.5 mg, Oral, Daily, Karmen Bongo, MD, 2.5 mg at 07/12/18 1108 .  metoprolol succinate (TOPROL-XL) 24 hr tablet 75 mg, 75 mg, Oral, Daily, Karmen Bongo, MD, 75 mg at 07/12/18 1108 .  midazolam (VERSED) injection, , Intravenous, PRN, Markus Daft, MD, 0.5 mg at 07/09/18 1216 .  morphine 2 MG/ML injection 2 mg, 2 mg, Intravenous, Q2H PRN, Karmen Bongo, MD, 2  mg at 07/11/18 0352 .  ondansetron (ZOFRAN) tablet 4 mg, 4 mg, Oral, Q6H PRN **OR** ondansetron (ZOFRAN) injection 4 mg, 4 mg, Intravenous, Q6H PRN, Karmen Bongo, MD .  pantoprazole (PROTONIX) EC tablet 40 mg, 40 mg, Oral, BID AC, Karmen Bongo, MD, 40 mg at 07/12/18 0835  Labs: CBC Recent Labs    07/10/18 0225  WBC 7.2  HGB 12.4*  HCT 38.9*  PLT 198   BMET Recent Labs    07/09/18 1901 07/10/18 0225  NA 139 137  K 4.3 4.1  CL 103 104  CO2 26 25  GLUCOSE 148* 149*  BUN 18 20  CREATININE 1.12 1.23  CALCIUM 9.0 8.7*   LFT No results for input(s): PROT, ALBUMIN, AST, ALT, ALKPHOS, BILITOT, BILIDIR, IBILI, LIPASE in the last 72 hours. PT/INR No results for input(s): LABPROT, INR in the last 72 hours.   Studies/Results: Dg Chest Port 1 View  Result Date: 07/12/2018 CLINICAL DATA:  Left pneumothorax. EXAM: PORTABLE CHEST 1 VIEW COMPARISON:  Radiograph of July 11, 2018. FINDINGS: Stable cardiomediastinal silhouette. Atherosclerosis of thoracic aorta is noted. Right lung is clear. Stable mild left apical pneumothorax is noted with minimal left basilar component. Mild left basilar subsegmental atelectasis is noted. Mild amount of subcutaneous emphysema seen over left lateral chest wall left supraclavicular region. Bony thorax is unremarkable. IMPRESSION: Mild left apical pneumothorax with minimal left basilar component. Subcutaneous emphysema is seen over left lateral chest wall supraclavicular region. Aortic Atherosclerosis (ICD10-I70.0).  Electronically Signed   By: Marijo Conception, M.D.   On: 07/12/2018 08:19   Dg Chest Port 1 View  Result Date: 07/11/2018 CLINICAL DATA:  Follow-up left pneumothorax. EXAM: PORTABLE CHEST 1 VIEW COMPARISON:  Chest x-ray from yesterday. FINDINGS: Interval removal of the left pigtail chest tube with recurrent small left pneumothorax. The heart size and mediastinal contours are within normal limits. Normal pulmonary vascularity. No consolidation or  pleural effusion. No acute osseous abnormality. Subcutaneous emphysema in the left chest wall. IMPRESSION: 1. Recurrent small left pneumothorax. These results will be called to the ordering clinician or representative by the Radiologist Assistant, and communication documented in the PACS or zVision Dashboard. Electronically Signed   By: Titus Dubin M.D.   On: 07/11/2018 17:00    Assessment/Plan: S/p (L)lung biopsy 12/31 with PTX Chest tube placed 12/31. Chest tube accidentally removed yesterday. CXR stable, no worse. Residual PTX too small for chest tube placement. Appreciate medicine team help, may need home O2    LOS: 2 days   I spent a total of 15 minutes in face to face in clinical consultation, greater than 50% of which was counseling/coordinating care for (L)PTX after biopsy  Ascencion Dike PA-C 07/12/2018 1:37 PM

## 2018-07-12 NOTE — Care Management Important Message (Signed)
Important Message  Patient Details  Name: [name] MRN: [id number] Date of Birth: 05/12/1930   Medicare Important Message Given:  Yes    Cosme Jacob [address] 07/12/2018, 3:31 PM

## 2018-07-13 ENCOUNTER — Encounter (HOSPITAL_COMMUNITY): Payer: Self-pay

## 2018-07-13 ENCOUNTER — Inpatient Hospital Stay (HOSPITAL_COMMUNITY): Payer: Medicare Other

## 2018-07-13 DIAGNOSIS — Z7901 Long term (current) use of anticoagulants: Secondary | ICD-10-CM

## 2018-07-13 DIAGNOSIS — R911 Solitary pulmonary nodule: Secondary | ICD-10-CM

## 2018-07-13 LAB — BASIC METABOLIC PANEL
Anion gap: 5 (ref 5–15)
BUN: 23 mg/dL (ref 8–23)
CO2: 30 mmol/L (ref 22–32)
Calcium: 8.6 mg/dL — ABNORMAL LOW (ref 8.9–10.3)
Chloride: 101 mmol/L (ref 98–111)
Creatinine, Ser: 1.28 mg/dL — ABNORMAL HIGH (ref 0.61–1.24)
GFR calc Af Amer: 58 mL/min — ABNORMAL LOW (ref >60)
GFR, EST NON AFRICAN AMERICAN: 50 mL/min — AB (ref >60)
Glucose, Bld: 129 mg/dL — ABNORMAL HIGH (ref 70–99)
Potassium: 4.2 mmol/L (ref 3.5–5.1)
Sodium: 136 mmol/L (ref 135–145)

## 2018-07-13 LAB — CBC
HCT: 39.3 % (ref 39.0–52.0)
Hemoglobin: 12.9 g/dL — ABNORMAL LOW (ref 13.0–17.0)
MCH: 29.1 pg (ref 26.0–34.0)
MCHC: 32.8 g/dL (ref 30.0–36.0)
MCV: 88.5 fL (ref 80.0–100.0)
NRBC: 0 % (ref 0.0–0.2)
Platelets: 235 10*3/uL (ref 150–400)
RBC: 4.44 MIL/uL (ref 4.22–5.81)
RDW: 13.2 % (ref 11.5–15.5)
WBC: 8.4 10*3/uL (ref 4.0–10.5)

## 2018-07-13 LAB — PROTIME-INR
INR: 1.2
PROTHROMBIN TIME: 15.1 s (ref 11.4–15.2)

## 2018-07-13 MED ORDER — MIDAZOLAM HCL 2 MG/2ML IJ SOLN
INTRAMUSCULAR | Status: AC | PRN
  Administered 2018-07-13: 1 mg via INTRAVENOUS

## 2018-07-13 MED ORDER — FENTANYL CITRATE (PF) 100 MCG/2ML IJ SOLN
INTRAMUSCULAR | Status: AC | PRN
  Administered 2018-07-13: 50 ug via INTRAVENOUS

## 2018-07-13 MED ORDER — FENTANYL CITRATE (PF) 100 MCG/2ML IJ SOLN
INTRAMUSCULAR | Status: AC
  Filled 2018-07-13: qty 4

## 2018-07-13 MED ORDER — SODIUM CHLORIDE 0.9 % IV BOLUS: 500.0000 mL | Freq: Once | INTRAVENOUS | Status: DC

## 2018-07-13 MED ORDER — CLOPIDOGREL BISULFATE 75 MG PO TABS: 75.0000 mg | ORAL_TABLET | Freq: Every day | ORAL | Status: DC

## 2018-07-13 MED ORDER — MIDAZOLAM HCL 2 MG/2ML IJ SOLN
INTRAMUSCULAR | Status: AC
  Filled 2018-07-13: qty 6

## 2018-07-13 MED ORDER — WARFARIN SODIUM 5 MG PO TABS: 5.0000 mg | ORAL_TABLET | Freq: Once | ORAL | Status: DC

## 2018-07-13 MED ORDER — SODIUM CHLORIDE 0.9 % IV SOLN
INTRAVENOUS | Status: AC
  Administered 2018-07-13: 11:00:00 via INTRAVENOUS

## 2018-07-13 NOTE — Progress Notes (Signed)
Case discussed with IR.  They had previously managed chest tube and removal.  They are planning to insert a new chest tube today.  PCCM will be available if needed.    Noe Gens, NP-C [facility] Pgr: [phone] or if no answer [phone] 07/13/2018, 12:02 PM

## 2018-07-13 NOTE — Progress Notes (Addendum)
ANTICOAGULATION CONSULT NOTE - Follow Up Consult  Pharmacy Consult for Lovenox and Coumadin Indication: atrial fibrillation  Allergies  Allergen Reactions  . Gadolinium Derivatives Hives, Itching and Other (See Comments)    Dr. Jeralyn Ruths s/w Mr. Mata. We observed him for 15 minutes. He did not need to take Benadryl. The symptoms began to subside before he left.     Patient Measurements: Height: 5\' 11"  (180.3 cm) Weight: 173 lb 11.6 oz (78.8 kg) IBW/kg (Calculated) : 75.3  Vital Signs: Temp: 98.4 F (36.9 C) (01/04 0531) Temp Source: Oral (01/04 0531) BP: 129/70 (01/04 0531) Pulse Rate: 73 (01/04 0531)  Labs: Recent Labs    07/13/18 0303  HGB 12.9*  HCT 39.3  PLT 235  CREATININE 1.28*    Estimated Creatinine Clearance: 44.1 mL/min (A) (by C-G formula based on SCr of 1.28 mg/dL (H)).  Assessment: 85 year old male on Coumadin 5mg  daily PTA exc 7.5mg  on Fri PTA for Afib. Admit INR 1.17. Coumadin held for lung biopsy, then developed pneumothorax requiring tube placement. Now bridging back to Coumadin with Lovenox. Hgb 12.9, plts wnl.  Goal of Therapy:  Anti-Xa level 0.6-1 units/ml 4hrs after LMWH dose given Monitor platelets by anticoagulation protocol: Yes   Plan:  Hold Lovenox and Coumadin today Monitor daily INR, CBC, s/s of bleed May need another chest tube placed  Elenor Quinones, PharmD, BCPS, [facility] Clinical Pharmacist Phone number [phone] 07/13/2018 7:46 AM

## 2018-07-13 NOTE — Procedures (Signed)
Interventional Radiology Procedure Note  Procedure: Left chest thoracostomy tube placement under CT guidance  Complications: None  Estimated Blood Loss: None  Findings: Left PTX now approximately 40% in volume by CT. 12 Fr anterior pigtail chest tube placed and angled superiorly. Attached to wall suction at -20 cm H2O.  Will follow.  Venetia Night. Kathlene Cote, M.D Pager:  [phone]

## 2018-07-13 NOTE — Consult Note (Signed)
Chief Complaint: Patient was seen in consultation today for left chest tube drain replacement at the request of Gerhardt,Edward B  Referring Physician(s): Gerhardt,Edward B Dr Mickel Crow  Supervising Physician: Aletta Edouard  Patient Status: [facility] - In-pt  History of Present Illness: [name] is a 85 y.o. male   Left lung bx 12/31 Post PTX requiring chest tube Admitted to [facility] for management of medical issues  Pt pulled ot chest tube accidentally 1/2 CXR this am: IMPRESSION: Slight enlargement of left apical and lateral pneumothorax estimated to be 10-15% in volume. There also is increase in subcutaneous emphysema in the chest wall and neck.  Dr Jonnie Finner has had to go up on O2 sats at 93%  Dr Kathlene Cote has reviewed status and imaging Now planned for left chest tube reinsertion     Past Medical History:  Diagnosis Date  . A-fib ([place])   . Arthritis   . BPH (benign prostatic hyperplasia)   . Chest pain, unspecified   . Chronic kidney disease   . Coronary atherosclerosis of unspecified type of vessel, native or graft   . Dizziness and giddiness   . Hyperlipidemia   . Hypertension   . Myocardial infarction ([place])    x2  . Non-ST elevation (NSTEMI) myocardial infarction ([place])   . Peripheral neuropathy   . PVD (peripheral vascular disease) ([place])   . Shortness of breath    exertion  . Vitamin B12 deficiency     Past Surgical History:  Procedure Laterality Date  . COLON SURGERY     partial colectomy Dr. Marcello Moores 12-14-17  . CORONARY ANGIOPLASTY     4 stents  . CORONARY STENT INTERVENTION N/A 08/09/2017   Procedure: CORONARY STENT INTERVENTION;  Surgeon: Nelva Bush, MD;  Location: [place] [facility];  Service: Cardiovascular;  Laterality: N/A;  . FLEXIBLE SIGMOIDOSCOPY N/A 11/12/2017   Procedure: FLEXIBLE SIGMOIDOSCOPY;  Surgeon: Irene Shipper, MD;  Location: [facility];  Service: Endoscopy;  Laterality: N/A;  . HERNIA REPAIR     right inguinal  .  INGUINAL HERNIA REPAIR  06/14/2012   Procedure: HERNIA REPAIR INGUINAL ADULT;  Surgeon: Odis Hollingshead, MD;  Location: [place];  Service: General;  Laterality: Right;  . INSERTION OF MESH  06/14/2012   Procedure: INSERTION OF MESH;  Surgeon: Odis Hollingshead, MD;  Location: [place];  Service: General;  Laterality: Right;  . LAPAROSCOPIC PARTIAL COLECTOMY N/A 12/26/2017   Procedure: LAPAROSCOPIC PARTIAL COLECTOMY ERAS PATHWAY;  Surgeon: Leighton Ruff, MD;  Location: [facility];  Service: General;  Laterality: N/A;  . LEFT HEART CATH AND CORONARY ANGIOGRAPHY N/A 08/09/2017   Procedure: LEFT HEART CATH AND CORONARY ANGIOGRAPHY;  Surgeon: Nelva Bush, MD;  Location: [place] [facility];  Service: Cardiovascular;  Laterality: N/A;  . PROCTOSCOPY N/A 12/26/2017   Procedure: PROCTOSCOPY;  Surgeon: Leighton Ruff, MD;  Location: [facility];  Service: General;  Laterality: N/A;  . SINUS SURGERY WITH INSTATRAK  1985    Allergies: Gadolinium derivatives  Medications: Prior to Admission medications   Medication Sig Start Date End Date Taking? Authorizing Provider  atorvastatin (LIPITOR) 40 MG tablet TAKE ONE TABLET DAILY AT 6PM Patient taking differently: Take 40 mg by mouth daily.  11/13/17  Yes Burnell Blanks, MD  clopidogrel (PLAVIX) 75 MG tablet TAKE ONE TABLET EACH DAY Patient taking differently: Take 75 mg by mouth daily.  09/03/17  Yes Burnell Blanks, MD  lisinopril (PRINIVIL,ZESTRIL) 5 MG tablet Take 0.5 tablets (2.5 mg total)  by mouth daily. 03/13/18  Yes Burnell Blanks, MD  metoprolol succinate (TOPROL-XL) 25 MG 24 hr tablet Take 3 tablets (75 mg total) by mouth daily. 01/07/18  Yes Burnell Blanks, MD  vitamin B-12 (CYANOCOBALAMIN) 1000 MCG tablet Take 1,000 mcg by mouth daily.   Yes [provider]  warfarin (COUMADIN) 5 MG tablet TAKE AS DIRECTED BY COUMADIN CLINIC Patient taking differently: Take 5-7.5 mg by mouth See admin instructions. Take as directed by  coumadin clinic; 7.5 mg (5 mg x 1.5) every Fri; 5 mg (5 mg x 1) all other days 06/28/18  Yes Burnell Blanks, MD     Family History  Problem Relation Age of Onset  . Heart attack Father 5  . Arthritis Sister   . Hypothyroidism Daughter     Social History   Socioeconomic History  . Marital status: Divorced    Spouse name: Not on file  . Number of children: 3  . Years of education: Not on file  . Highest education level: Not on file  Occupational History  . Occupation: Magazine features editor    Comment: [employer]  Social Needs  . Financial resource strain: Not on file  . Food insecurity:    Worry: Not on file    Inability: Not on file  . Transportation needs:    Medical: Not on file    Non-medical: Not on file  Tobacco Use  . Smoking status: Former Smoker    Last attempt to quit: 07/10/1981    Years since quitting: 37.0  . Smokeless tobacco: Never Used  Substance and Sexual Activity  . Alcohol use: No  . Drug use: No  . Sexual activity: Not on file  Lifestyle  . Physical activity:    Days per week: Not on file    Minutes per session: Not on file  . Stress: Not on file  Relationships  . Social connections:    Talks on phone: Not on file    Gets together: Not on file    Attends religious service: Not on file    Active member of club or organization: Not on file    Attends meetings of clubs or organizations: Not on file    Relationship status: Not on file  Other Topics Concern  . Not on file  Social History Narrative   Regular exercise          Review of Systems: A 12 point ROS discussed and pertinent positives are indicated in the HPI above.  All other systems are negative.  Review of Systems  Respiratory: Positive for wheezing. Negative for cough.   Psychiatric/Behavioral: Negative for behavioral problems and confusion.    Vital Signs: BP 129/70 (BP Location: Left Arm)   Pulse 73   Temp 98.4 F (36.9 C) (Oral)   Resp 20   Ht 5\' 11"   (1.803 m)   Wt 173 lb 11.6 oz (78.8 kg)   SpO2 93%   BMI 24.23 kg/m   Physical Exam Vitals signs reviewed.  Cardiovascular:     Rate and Rhythm: Normal rate and regular rhythm.  Pulmonary:     Effort: Pulmonary effort is normal.     Breath sounds: Wheezing present.  Musculoskeletal: Normal range of motion.  Skin:    General: Skin is warm and dry.     Comments: Definite emphysematous at neck and chest  Neurological:     General: No focal deficit present.     Mental Status: He is alert and  oriented to person, place, and time.  Psychiatric:        Mood and Affect: Mood normal.        Behavior: Behavior normal.        Thought Content: Thought content normal.        Judgment: Judgment normal.     Imaging: Dg Chest 2 View  Result Date: 07/13/2018 CLINICAL DATA:  Left pneumothorax after recent lung biopsy. EXAM: CHEST - 2 VIEW COMPARISON:  07/12/2018 FINDINGS: The heart size and mediastinal contours are within normal limits. There is slight enlargement of left apical and lateral pneumothorax estimated to be roughly 10-15% in volume. There also is increase in subcutaneous emphysema across the chest wall and extending into the neck. Bibasilar atelectasis present. No pulmonary edema or pleural fluid identified. The visualized skeletal structures are unremarkable. IMPRESSION: Slight enlargement of left apical and lateral pneumothorax estimated to be 10-15% in volume. There also is increase in subcutaneous emphysema in the chest wall and neck. Electronically Signed   By: Aletta Edouard M.D.   On: 07/13/2018 09:24   Nm Pet Image Initial (pi) Skull Base To Thigh  Result Date: 06/26/2018 CLINICAL DATA:  Initial treatment strategy for sigmoid colon carcinoma. EXAM: NUCLEAR MEDICINE PET SKULL BASE TO THIGH TECHNIQUE: 9.6 mCi F-18 FDG was injected intravenously. Full-ring PET imaging was performed from the skull base to thigh after the radiotracer. CT data was obtained and used for attenuation  correction and anatomic localization. Fasting blood glucose: 99 mg/dl COMPARISON:  None. FINDINGS: Mediastinal blood pool activity: SUV max 1.81 NECK: No hypermetabolic lymph nodes in the neck. Incidental CT findings: none CHEST: Within the LEFT upper lobe solid and sub solid nodule is again noted measuring up to 20 mm including the sub solid portion. This central solid portion measures 11 mm and has associated metabolic activity (SUV max equal 4.5). No additional hypermetabolic pulmonary nodules. No hypermetabolic mediastinal lymph nodes. Incidental CT findings: Coronary artery calcification and aortic atherosclerotic calcification. ABDOMEN/PELVIS: There is intense circumferential metabolic activity involving the gastric fundus, gastric body and extending to the gastric antrum. Activity is intense with SUV max equal 14.2. There is no mass lesion identified on CT portion of exam. There is mild activity through the distal esophagus from the level the carina to the GE junction. No abnormal activity in the liver. No hypermetabolic abdominopelvic lymph nodes. Symmetric activity in the adrenal glands noted. Prostate is enlarged. Anastomosis in the sigmoid colon noted. No evidence obstruction no metabolic at nodularity. No hypermetabolic pelvic lymph nodes. Incidental CT findings: none SKELETON: No focal hypermetabolic activity to suggest skeletal metastasis. Incidental CT findings: none IMPRESSION: 1. Hypermetabolic LEFT upper lobe pulmonary nodule. The solid portion of this nodule is increased over time. Findings concerning for BRONCHOGENIC CARCINOMA. 2. Intense radiotracer activity within the gastric fundus and body which is greater than normal physiologic activity. Differential would include gastritis versus lymphoma or primary gastric malignancy. As there is no mass lesion identified, malignancy is less favored. Recommend upper endoscopy and potentially tissue sampling. 3. Diffuse activity associated the distal  esophagus may relate to abnormal hypermetabolic stomach. 4. No evidence of local colorectal carcinoma at the sigmoid anastomosis. 5. No evidence of metastatic disease in the abdomen pelvis. Electronically Signed   By: Suzy Bouchard M.D.   On: 06/26/2018 17:10   Ct Biopsy  Result Date: 07/09/2018 INDICATION: 85 year old with a suspicious lesion in the left upper lobe. History of colon cancer. Plan for CT-guided left upper lobe nodule biopsy. EXAM:  CT-GUIDED BIOPSY OF LEFT UPPER LOBE PULMONARY NODULE CT-GUIDED PLACEMENT OF LEFT CHEST TUBE MEDICATIONS: None. ANESTHESIA/SEDATION: Moderate (conscious) sedation was employed during this procedure. A total of Versed 1.5 mg and Fentanyl 50 mcg was administered intravenously. Moderate Sedation Time: 32 minutes. The patient's level of consciousness and vital signs were monitored continuously by radiology nursing throughout the procedure under my direct supervision. FLUOROSCOPY TIME:  None COMPLICATIONS: Pneumothorax that required chest tube placement. PROCEDURE: Informed written consent was obtained from the patient after a thorough discussion of the procedural risks, benefits and alternatives. All questions were addressed. A timeout was performed prior to the initiation of the procedure. Patient was placed supine. CT images through the chest were obtained. Small lesion in the periphery of the left upper lobe was identified and targeted. The anterior chest was prepped with chlorhexidine and sterile field was created. Maximal barrier sterile technique was utilized including caps, mask, sterile gowns, sterile gloves, sterile drape, hand hygiene and skin antiseptic. Skin was anesthetized with 1% lidocaine. A 17 gauge coaxial needle was directed into the left lung with CT guidance and there was an immediate left pneumothorax. Left pneumothorax continued to enlarge over a few minutes although the patient was asymptomatic. Decision was made to place a chest tube. Amplatz  wire was advanced through the existing needle and the tract was dilated to accommodate a 10.2 [place] multipurpose drain. Drain was attached to a PleurEvac and wall suction. After placement to suction, the pneumothorax resolved. A 17 gauge needle was then directed slightly lateral to the existing chest tube and into the lesion with CT guidance. Two core biopsies were obtained with an 18 gauge core device. Specimens placed in formalin. Needle was removed without complication. The chest tube was sutured to the skin. FINDINGS: Small irregular opacity in the periphery of the left upper lobe. Patient immediately developed a pneumothorax following placement of the needle into the left lung. The pneumothorax was enlarging over a few minutes although the patient remained asymptomatic. Chest tube was required in order to perform the biopsy. Chest tube successfully placed along the anterior left lung. Biopsy needle was confirmed within the lesion. Two core biopsies were obtained. At the end of the procedure, there was minimal parenchymal hemorrhage around the lesion. Pneumothorax had essentially resolved with the left anterior chest tube. IMPRESSION: CT-guided biopsy of the left upper lobe lung lesion. Procedure was complicated by a pneumothorax. Pneumothorax was successfully treated with a CT-guided chest tube placement. Patient will be admitted for chest tube management. Electronically Signed   By: Markus Daft M.D.   On: 07/09/2018 16:35   Dg Chest Port 1 View  Result Date: 07/12/2018 CLINICAL DATA:  Left pneumothorax. EXAM: PORTABLE CHEST 1 VIEW COMPARISON:  Radiograph of July 11, 2018. FINDINGS: Stable cardiomediastinal silhouette. Atherosclerosis of thoracic aorta is noted. Right lung is clear. Stable mild left apical pneumothorax is noted with minimal left basilar component. Mild left basilar subsegmental atelectasis is noted. Mild amount of subcutaneous emphysema seen over left lateral chest wall left  supraclavicular region. Bony thorax is unremarkable. IMPRESSION: Mild left apical pneumothorax with minimal left basilar component. Subcutaneous emphysema is seen over left lateral chest wall supraclavicular region. Aortic Atherosclerosis (ICD10-I70.0). Electronically Signed   By: Marijo Conception, M.D.   On: 07/12/2018 08:19   Dg Chest Port 1 View  Result Date: 07/11/2018 CLINICAL DATA:  Follow-up left pneumothorax. EXAM: PORTABLE CHEST 1 VIEW COMPARISON:  Chest x-ray from yesterday. FINDINGS: Interval removal of the left pigtail chest tube  with recurrent small left pneumothorax. The heart size and mediastinal contours are within normal limits. Normal pulmonary vascularity. No consolidation or pleural effusion. No acute osseous abnormality. Subcutaneous emphysema in the left chest wall. IMPRESSION: 1. Recurrent small left pneumothorax. These results will be called to the ordering clinician or representative by the Radiologist Assistant, and communication documented in the PACS or zVision Dashboard. Electronically Signed   By: Titus Dubin M.D.   On: 07/11/2018 17:00   Dg Chest Port 1 View  Result Date: 07/10/2018 CLINICAL DATA:  Pneumothorax after biopsy EXAM: PORTABLE CHEST - 1 VIEW COMPARISON:  the previous day's study FINDINGS: The left pigtail chest tube has retracted somewhat, sideholes still projecting within the left hemithorax. No convincing residual apical pneumothorax visualized on this portable exam. Some mild subsegmental atelectasis in the lung bases. Heart size and mediastinal contours are within normal limits. Aortic Atherosclerosis (ICD10-170.0). Coronary stents. No effusion. Visualized bones unremarkable. IMPRESSION: 1. Partial retraction of chest tube.  No definite pneumothorax. Electronically Signed   By: Lucrezia Europe M.D.   On: 07/10/2018 09:03   Dg Chest Port 1 View  Result Date: 07/09/2018 CLINICAL DATA:  Left chest tube. EXAM: PORTABLE CHEST 1 VIEW COMPARISON:  08/08/2017.  Chest  CT for biopsy earlier today. FINDINGS: Left chest tube is in place. Tiny left apical pneumothorax. Heart is normal size. Coronary artery stents noted. No confluent airspace opacities or effusions. IMPRESSION: Left chest tube in place with tiny left apical pneumothorax. Electronically Signed   By: Rolm Baptise M.D.   On: 07/09/2018 16:57   Ct Perc Pleural Drain W/[place] Cath W/img Guide  Result Date: 07/09/2018 INDICATION: 85 year old with a suspicious lesion in the left upper lobe. History of colon cancer. Plan for CT-guided left upper lobe nodule biopsy. EXAM: CT-GUIDED BIOPSY OF LEFT UPPER LOBE PULMONARY NODULE CT-GUIDED PLACEMENT OF LEFT CHEST TUBE MEDICATIONS: None. ANESTHESIA/SEDATION: Moderate (conscious) sedation was employed during this procedure. A total of Versed 1.5 mg and Fentanyl 50 mcg was administered intravenously. Moderate Sedation Time: 32 minutes. The patient's level of consciousness and vital signs were monitored continuously by radiology nursing throughout the procedure under my direct supervision. FLUOROSCOPY TIME:  None COMPLICATIONS: Pneumothorax that required chest tube placement. PROCEDURE: Informed written consent was obtained from the patient after a thorough discussion of the procedural risks, benefits and alternatives. All questions were addressed. A timeout was performed prior to the initiation of the procedure. Patient was placed supine. CT images through the chest were obtained. Small lesion in the periphery of the left upper lobe was identified and targeted. The anterior chest was prepped with chlorhexidine and sterile field was created. Maximal barrier sterile technique was utilized including caps, mask, sterile gowns, sterile gloves, sterile drape, hand hygiene and skin antiseptic. Skin was anesthetized with 1% lidocaine. A 17 gauge coaxial needle was directed into the left lung with CT guidance and there was an immediate left pneumothorax. Left pneumothorax continued to  enlarge over a few minutes although the patient was asymptomatic. Decision was made to place a chest tube. Amplatz wire was advanced through the existing needle and the tract was dilated to accommodate a 10.2 [place] multipurpose drain. Drain was attached to a PleurEvac and wall suction. After placement to suction, the pneumothorax resolved. A 17 gauge needle was then directed slightly lateral to the existing chest tube and into the lesion with CT guidance. Two core biopsies were obtained with an 18 gauge core device. Specimens placed in formalin. Needle was removed without complication.  The chest tube was sutured to the skin. FINDINGS: Small irregular opacity in the periphery of the left upper lobe. Patient immediately developed a pneumothorax following placement of the needle into the left lung. The pneumothorax was enlarging over a few minutes although the patient remained asymptomatic. Chest tube was required in order to perform the biopsy. Chest tube successfully placed along the anterior left lung. Biopsy needle was confirmed within the lesion. Two core biopsies were obtained. At the end of the procedure, there was minimal parenchymal hemorrhage around the lesion. Pneumothorax had essentially resolved with the left anterior chest tube. IMPRESSION: CT-guided biopsy of the left upper lobe lung lesion. Procedure was complicated by a pneumothorax. Pneumothorax was successfully treated with a CT-guided chest tube placement. Patient will be admitted for chest tube management. Electronically Signed   By: Markus Daft M.D.   On: 07/09/2018 16:35    Labs:  CBC: Recent Labs    12/29/17 0507 07/09/18 0941 07/10/18 0225 07/13/18 0303  WBC 8.6 8.4 7.2 8.4  HGB 11.5* 13.5 12.4* 12.9*  HCT 35.6* 43.2 38.9* 39.3  PLT 168 231 198 235    COAGS: Recent Labs    05/30/18 0958 06/13/18 0929 06/27/18 1320 07/09/18 0941  INR 3.3* 4.0* 3.2* 1.17    BMP: Recent Labs    03/27/18 1057 07/09/18 1901  07/10/18 0225 07/13/18 0303  NA 143 139 137 136  K 4.2 4.3 4.1 4.2  CL 103 103 104 101  CO2 26 26 25 30   GLUCOSE 92 148* 149* 129*  BUN 25 18 20 23   CALCIUM 9.3 9.0 8.7* 8.6*  CREATININE 1.26 1.12 1.23 1.28*  GFRNONAA 51* 59* 53* 50*  GFRAA 59* >60 >60 58*    LIVER FUNCTION TESTS: No results for input(s): BILITOT, AST, ALT, ALKPHOS, PROT, ALBUMIN in the last 8760 hours.  TUMOR MARKERS: No results for input(s): AFPTM, CEA, CA199, CHROMGRNA in the last 8760 hours.  Assessment and Plan:  Left lung bx 12/31 Post PTX-- +chest tube placed and admission Chest tube inadvertently removed by pt 1/2 New CXR shows larger PTX and subcu emphysema Now for chest tube reinsertion Risks and benefits discussed with the patient including bleeding, infection, damage to adjacent structures, and sepsis.  All of the patient's questions were answered, patient is agreeable to proceed. Consent signed and in chart.    Thank you for this interesting consult.  I greatly enjoyed meeting DEUNTE BLEDSOE and look forward to participating in their care.  A copy of this report was sent to the requesting provider on this date.  Electronically Signed: Lavonia Drafts, PA-C 07/13/2018, 10:13 AM   I spent a total of 40 Minutes    in face to face in clinical consultation, greater than 50% of which was counseling/coordinating care for left chest tube placement

## 2018-07-13 NOTE — Progress Notes (Addendum)
PROGRESS NOTE    [name]   DUK:[id number]  DOB: Nov 11, 1929  DOA: 07/09/2018 PCP: Marton Redwood, MD   Brief Narrative:  [name]   is a 85 y.o. male with medical history significant of PVD; CAD; HTN; HLD; CKD; and afib.   He came to have a lung biopsy by IR done on 07/09/18, and subsequently developed a pneumothorax. A chest tube was placed by IR same day, and [facility] were asked to admit.    Subjective: O2 Sats dropped this am and needed to ^ to 3L [place].  Pt has no new c/o's.  CXR 2 view just back showing ^'d SQ emphysema bilat chest and ^'d PTX 10-15%.     Home meds:  - lisinopril 2.5 qd/ metoprolol xl 75 qd  - atorvastatin 40 qd/ clopidogrel 75 qd  - warfarin 5mg  qd except 7.5 on friday    Assessment & Plan:    Pneumothorax of left lung after biopsy - sp biopsy of LUL nodule done 08/09/17 - required placement of L chest tube (pigtail) for procedure related PTX 08/09/17 - admitted to [facility] after CT placed - accidentally pt pulled out his chest tube on 07/11/18 in the evening - initial CXR's were stable at 5% ptx but today's showing ^10-15% ptx and worsening bilat SQ air, also ^'d O2 requirement to 3L [place] this am > IR is evaluating and will likely need another chest tube, made NPO - cont to hold plavix, put coumadin back on hold and hold lovenox, resume after procedure   Acute hypoxemic resp failure: as above   Paroxysmal  Atrial fibrillation: CHADsVASC2 = 4 - coumadin back on hold for procedure - continue Metoprolol, pt in NSR here    CAD without angina: severe 2V CAD hx recurrent stents in LAD and LCx. At home takes plavix, statin and beta blocker.     HTN: BP is controlled. No changes   Ischemic cardiomyopathy: EF 40% in 07/2017 improved to 60% when checked in Sept 2019, prob due to revasc / PCI procedures done in January . Continue the beta blocker.      Lung nodule- PET positive - f/u on biopsy results from 07/09/18    Cancer of sigmoid  colon s/p sigmoid colectomy 12/26/2017   DVT prophylaxis: lovenox  Code Status: DNR Family Communication: none present Disposition Plan: none at present  Kelly Splinter MD [place] pgr [phone] 07/13/2018, 10:00 AM   Consultants:   IR Procedures:   Chest tube Antimicrobials:  Anti-infectives (From admission, onward)   None       Objective: Vitals:   07/12/18 2113 07/13/18 0435 07/13/18 0440 07/13/18 0531  BP: 138/64   129/70  Pulse: 74   73  Resp: 20   20  Temp: 98.9 F (37.2 C)   98.4 F (36.9 C)  TempSrc: Oral   Oral  SpO2: 94%   93%  Weight:  78.8 kg 78.8 kg   Height:        Intake/Output Summary (Last 24 hours) at 07/13/2018 1000 Last data filed at 07/13/2018 0532 Gross per 24 hour  Intake 690 ml  Output 625 ml  Net 65 ml   Filed Weights   07/09/18 1615 07/13/18 0435 07/13/18 0440  Weight: 78.9 kg 78.8 kg 78.8 kg    Examination: General exam: Appears comfortable  HEENT: PERRLA, oral mucosa moist, no sclera icterus or thrush Respiratory system: Clear to auscultation. Respiratory effort normal. Left Chest tube in place  Cardiovascular system: S1 & S2 heard,  No murmurs  Gastrointestinal system: Abdomen soft, non-tender, nondistended. Normal bowel sound. No organomegaly Central nervous system: Alert and oriented. No focal neurological deficits. Extremities: No cyanosis, clubbing or edema Skin: No rashes or ulcers Psychiatry:  Mood & affect appropriate. .     Data Reviewed: I have personally reviewed following labs and imaging studies  CBC: Recent Labs  Lab 07/09/18 0941 07/10/18 0225 07/13/18 0303  WBC 8.4 7.2 8.4  HGB 13.5 12.4* 12.9*  HCT 43.2 38.9* 39.3  MCV 88.7 87.4 88.5  PLT 231 198 786   Basic Metabolic Panel: Recent Labs  Lab 07/09/18 1901 07/10/18 0225 07/13/18 0303  NA 139 137 136  K 4.3 4.1 4.2  CL 103 104 101  CO2 26 25 30   GLUCOSE 148* 149* 129*  BUN 18 20 23   CREATININE 1.12 1.23 1.28*  CALCIUM 9.0  8.7* 8.6*   GFR: Estimated Creatinine Clearance: 44.1 mL/min (A) (by C-G formula based on SCr of 1.28 mg/dL (H)). Liver Function Tests: No results for input(s): AST, ALT, ALKPHOS, BILITOT, PROT, ALBUMIN in the last 168 hours. No results for input(s): LIPASE, AMYLASE in the last 168 hours. No results for input(s): AMMONIA in the last 168 hours. Coagulation Profile: Recent Labs  Lab 07/09/18 0941  INR 1.17   Cardiac Enzymes: No results for input(s): CKTOTAL, CKMB, CKMBINDEX, TROPONINI in the last 168 hours. BNP (last 3 results) No results for input(s): PROBNP in the last 8760 hours. HbA1C: No results for input(s): HGBA1C in the last 72 hours. CBG: No results for input(s): GLUCAP in the last 168 hours. Lipid Profile: No results for input(s): CHOL, HDL, LDLCALC, TRIG, CHOLHDL, LDLDIRECT in the last 72 hours. Thyroid Function Tests: No results for input(s): TSH, T4TOTAL, FREET4, T3FREE, THYROIDAB in the last 72 hours. Anemia Panel: No results for input(s): VITAMINB12, FOLATE, FERRITIN, TIBC, IRON, RETICCTPCT in the last 72 hours. Urine analysis: @LABRCNTIP (procalcitonin:4,lacticidven:4) )No results found for this or any previous visit (from the past 240 hour(s)).    Radiology Studies: Dg Chest 2 View  Result Date: 07/13/2018 CLINICAL DATA:  Left pneumothorax after recent lung biopsy. EXAM: CHEST - 2 VIEW COMPARISON:  07/12/2018 FINDINGS: The heart size and mediastinal contours are within normal limits. There is slight enlargement of left apical and lateral pneumothorax estimated to be roughly 10-15% in volume. There also is increase in subcutaneous emphysema across the chest wall and extending into the neck. Bibasilar atelectasis present. No pulmonary edema or pleural fluid identified. The visualized skeletal structures are unremarkable. IMPRESSION: Slight enlargement of left apical and lateral pneumothorax estimated to be 10-15% in volume. There also is increase in subcutaneous  emphysema in the chest wall and neck. Electronically Signed   By: Aletta Edouard M.D.   On: 07/13/2018 09:24   Dg Chest Port 1 View  Result Date: 07/12/2018 CLINICAL DATA:  Left pneumothorax. EXAM: PORTABLE CHEST 1 VIEW COMPARISON:  Radiograph of July 11, 2018. FINDINGS: Stable cardiomediastinal silhouette. Atherosclerosis of thoracic aorta is noted. Right lung is clear. Stable mild left apical pneumothorax is noted with minimal left basilar component. Mild left basilar subsegmental atelectasis is noted. Mild amount of subcutaneous emphysema seen over left lateral chest wall left supraclavicular region. Bony thorax is unremarkable. IMPRESSION: Mild left apical pneumothorax with minimal left basilar component. Subcutaneous emphysema is seen over left lateral chest wall supraclavicular region. Aortic Atherosclerosis (ICD10-I70.0). Electronically Signed   By: Marijo Conception, M.D.   On: 07/12/2018 08:19  Dg Chest Port 1 View  Result Date: 07/11/2018 CLINICAL DATA:  Follow-up left pneumothorax. EXAM: PORTABLE CHEST 1 VIEW COMPARISON:  Chest x-ray from yesterday. FINDINGS: Interval removal of the left pigtail chest tube with recurrent small left pneumothorax. The heart size and mediastinal contours are within normal limits. Normal pulmonary vascularity. No consolidation or pleural effusion. No acute osseous abnormality. Subcutaneous emphysema in the left chest wall. IMPRESSION: 1. Recurrent small left pneumothorax. These results will be called to the ordering clinician or representative by the Radiologist Assistant, and communication documented in the PACS or zVision Dashboard. Electronically Signed   By: Titus Dubin M.D.   On: 07/11/2018 17:00      Scheduled Meds: . atorvastatin  40 mg Oral Daily  . clopidogrel  75 mg Oral Daily  . docusate sodium  100 mg Oral BID  . guaiFENesin  600 mg Oral BID  . ipratropium-albuterol  3 mL Nebulization TID  . lisinopril  2.5 mg Oral Daily  . metoprolol  succinate  75 mg Oral Daily  . pantoprazole  40 mg Oral BID AC  . warfarin  5 mg Oral ONCE-1800  . Warfarin - Pharmacist Dosing Inpatient   Does not apply q1800   Continuous Infusions: . sodium chloride       LOS: 3 days    Time spent in minutes: 30    Sol Blazing, MD [facility] Pager: [email or web address] Password [email or web address] 07/13/2018, 10:00 AM

## 2018-07-13 NOTE — Discharge Instructions (Signed)

## 2018-07-14 ENCOUNTER — Inpatient Hospital Stay (HOSPITAL_COMMUNITY): Payer: Medicare Other

## 2018-07-14 LAB — BASIC METABOLIC PANEL
Anion gap: 8 (ref 5–15)
Anion gap: 10 (ref 5–15)
BUN: 37 mg/dL — ABNORMAL HIGH (ref 8–23)
BUN: 38 mg/dL — ABNORMAL HIGH (ref 8–23)
CO2: 26 mmol/L (ref 22–32)
CO2: 24 mmol/L (ref 22–32)
Calcium: 8.3 mg/dL — ABNORMAL LOW (ref 8.9–10.3)
Calcium: 8.7 mg/dL — ABNORMAL LOW (ref 8.9–10.3)
Chloride: 101 mmol/L (ref 98–111)
Chloride: 100 mmol/L (ref 98–111)
Creatinine, Ser: 1.81 mg/dL — ABNORMAL HIGH (ref 0.61–1.24)
Creatinine, Ser: 1.7 mg/dL — ABNORMAL HIGH (ref 0.61–1.24)
GFR calc Af Amer: 38 mL/min — ABNORMAL LOW (ref >60)
GFR calc non Af Amer: 33 mL/min — ABNORMAL LOW (ref >60)
GFR, EST AFRICAN AMERICAN: 41 mL/min — AB (ref >60)
GFR, EST NON AFRICAN AMERICAN: 36 mL/min — AB (ref >60)
Glucose, Bld: 118 mg/dL — ABNORMAL HIGH (ref 70–99)
Glucose, Bld: 165 mg/dL — ABNORMAL HIGH (ref 70–99)
POTASSIUM: 4.2 mmol/L (ref 3.5–5.1)
Potassium: 4.2 mmol/L (ref 3.5–5.1)
Sodium: 135 mmol/L (ref 135–145)
Sodium: 134 mmol/L — ABNORMAL LOW (ref 135–145)

## 2018-07-14 LAB — MAGNESIUM: Magnesium: 1.8 mg/dL (ref 1.7–2.4)

## 2018-07-14 LAB — PHOSPHORUS: PHOSPHORUS: 3.8 mg/dL (ref 2.5–4.6)

## 2018-07-14 LAB — PROTIME-INR
INR: 1.29
Prothrombin Time: 16 seconds — ABNORMAL HIGH (ref 11.4–15.2)

## 2018-07-14 LAB — TROPONIN I

## 2018-07-14 MED ORDER — METOPROLOL TARTRATE 25 MG PO TABS
25.0000 mg | ORAL_TABLET | Freq: Once | ORAL | Status: AC
  Administered 2018-07-14: 25 mg via ORAL
  Filled 2018-07-14: qty 1

## 2018-07-14 MED ORDER — SODIUM CHLORIDE 0.45 % IV SOLN
INTRAVENOUS | Status: DC
  Administered 2018-07-14 – 2018-07-15 (×2): via INTRAVENOUS

## 2018-07-14 NOTE — Progress Notes (Signed)
PROGRESS NOTE    [name]   ZOX:[id number]  DOB: 21-Apr-1930  DOA: 07/09/2018 PCP: Marton Redwood, MD   Brief Narrative:  [name]   is a 85 y.o. male with medical history significant of PVD; CAD; HTN; HLD; CKD; and afib.   He came to have a lung biopsy by IR done on 07/09/18, and subsequently developed a pneumothorax. A chest tube was placed by IR same day, and [facility] were asked to admit.    Subjective: Has had a new CT done today, chest pain is better. Family updated at bedside      Assessment & Plan:    Pneumothorax of left lung after biopsy - sp biopsy of LUL nodule done 08/09/17 - required placement of L chest tube (pigtail) for procedure related PTX 08/09/17 - admitted to [facility] after CT placed - accidentally pt pulled out his chest tube on 07/11/18 in the evening - IR put in new CT today -associated [place] Emphysema  AKI Pt seems dry Gentle hydration with f/u of renal function- consider renal ultrasound with ua if not improvement   Paroxysmal  Atrial fibrillation  CHADsVASC2 = 4 Dose of Metoprolol increased for better rate control- watch BP   CAD without angina severe 2V CAD hx recurrent stents in LAD and LCx.  At home takes plavix, statin and beta blocker.     HTN BP is controlled. No changes  Ischemic cardiomyopathy EF 40% in 07/2017 improved to 60% when checked in Sept 2019, prob due to revasc / PCI procedures done in January . Continue the beta blocker.     Lung nodule PET positive f/u on biopsy results from 07/09/18     DVT prophylaxis: lovenox  Code Status: DNR Family Communication: none present Disposition Plan: none at present    Consultants:   IR Procedures:   Chest tube Antimicrobials:  Anti-infectives (From admission, onward)   None       Objective: Vitals:   07/14/18 0500 07/14/18 0528 07/14/18 0834 07/14/18 1024  BP:  (!) 111/52  (!) 104/58  Pulse:  67    Resp:  17    Temp:  98.2 F (36.8 C)      TempSrc:  Oral    SpO2:  94% 90%   Weight: 78.8 kg     Height:        Intake/Output Summary (Last 24 hours) at 07/14/2018 1146 Last data filed at 07/14/2018 1005 Gross per 24 hour  Intake 1135.3 ml  Output 560 ml  Net 575.3 ml   Filed Weights   07/13/18 0435 07/13/18 0440 07/14/18 0500  Weight: 78.8 kg 78.8 kg 78.8 kg    Examination: General exam: Appears comfortable  HEENT: PERRLA, oral mucosa moist, no sclera icterus or thrush Respiratory system: Diminished BS(+). Respiratory effort normal. Left Chest tube in place Cardiovascular system: S1 & S2 heard,  No murmurs  Gastrointestinal system: Abdomen soft, non-tender, nondistended. Normal bowel sound. No organomegaly Central nervous system: Alert and oriented. No focal neurological deficits. Extremities: No cyanosis, clubbing or edema Skin: No rashes or ulcers Psychiatry:  Mood & affect appropriate. .     Data Reviewed: I have personally reviewed following labs and imaging studies  CBC: Recent Labs  Lab 07/09/18 0941 07/10/18 0225 07/13/18 0303  WBC 8.4 7.2 8.4  HGB 13.5 12.4* 12.9*  HCT 43.2 38.9* 39.3  MCV 88.7 87.4 88.5  PLT 231 198 811   Basic Metabolic Panel: Recent Labs  Lab 07/09/18 1901  07/10/18 0225 07/13/18 0303 07/14/18 0532  NA 139 137 136 135  K 4.3 4.1 4.2 4.2  CL 103 104 101 101  CO2 26 25 30 26   GLUCOSE 148* 149* 129* 118*  BUN 18 20 23  37*  CREATININE 1.12 1.23 1.28* 1.81*  CALCIUM 9.0 8.7* 8.6* 8.3*   GFR: Estimated Creatinine Clearance: 31.2 mL/min (A) (by C-G formula based on SCr of 1.81 mg/dL (H)). Liver Function Tests: No results for input(s): AST, ALT, ALKPHOS, BILITOT, PROT, ALBUMIN in the last 168 hours. No results for input(s): LIPASE, AMYLASE in the last 168 hours. No results for input(s): AMMONIA in the last 168 hours. Coagulation Profile: Recent Labs  Lab 07/09/18 0941 07/13/18 0834 07/14/18 0532  INR 1.17 1.20 1.29   Cardiac Enzymes: No results for input(s):  CKTOTAL, CKMB, CKMBINDEX, TROPONINI in the last 168 hours. BNP (last 3 results) No results for input(s): PROBNP in the last 8760 hours. HbA1C: No results for input(s): HGBA1C in the last 72 hours. CBG: No results for input(s): GLUCAP in the last 168 hours. Lipid Profile: No results for input(s): CHOL, HDL, LDLCALC, TRIG, CHOLHDL, LDLDIRECT in the last 72 hours. Thyroid Function Tests: No results for input(s): TSH, T4TOTAL, FREET4, T3FREE, THYROIDAB in the last 72 hours. Anemia Panel: No results for input(s): VITAMINB12, FOLATE, FERRITIN, TIBC, IRON, RETICCTPCT in the last 72 hours. Urine analysis: @LABRCNTIP (procalcitonin:4,lacticidven:4) )No results found for this or any previous visit (from the past 240 hour(s)).    Radiology Studies: Dg Chest 2 View  Result Date: 07/14/2018 CLINICAL DATA:  Pneumothorax. EXAM: CHEST - 2 VIEW COMPARISON:  07/13/2018, 07/10/2018 and CT 07/13/2018 FINDINGS: Interval placement of a left pigtail pleural catheter with tip over the lateral mid thorax. No surgical left-sided pneumothorax visualized. Lungs are hypoinflated with mild stable opacification over the medial lung bases likely small amount of layering pleural fluid and atelectasis. Small nodule opacity over the lateral left midlung not well seen on the recent prior exams. Minimal prominence of the central pulmonary vessels unchanged. Mild stable cardiomegaly. Moderate subcutaneous emphysema over the thorax unchanged. Remainder of the exam is unchanged. IMPRESSION: Stable medial bibasilar opacification likely layering effusions with atelectasis. Left pleural catheter in place. No residual left-sided pneumothorax visualized. Small nodular density over the lateral left midlung not well seen on recent prior exams. Recommend attention on follow-up. Electronically Signed   By: Marin Olp M.D.   On: 07/14/2018 07:16   Dg Chest 2 View  Result Date: 07/13/2018 CLINICAL DATA:  Left pneumothorax after recent lung  biopsy. EXAM: CHEST - 2 VIEW COMPARISON:  07/12/2018 FINDINGS: The heart size and mediastinal contours are within normal limits. There is slight enlargement of left apical and lateral pneumothorax estimated to be roughly 10-15% in volume. There also is increase in subcutaneous emphysema across the chest wall and extending into the neck. Bibasilar atelectasis present. No pulmonary edema or pleural fluid identified. The visualized skeletal structures are unremarkable. IMPRESSION: Slight enlargement of left apical and lateral pneumothorax estimated to be 10-15% in volume. There also is increase in subcutaneous emphysema in the chest wall and neck. Electronically Signed   By: Aletta Edouard M.D.   On: 07/13/2018 09:24   Ct Perc Pleural Drain W/indwell Cath W/img Guide  Result Date: 07/14/2018 INDICATION: Recurrent and enlarging left pneumothorax following percutaneous biopsy of left upper lobe lung nodule on 07/09/2018. A pigtail chest tube was placed at that time and inadvertently removed on 07/11/2018. Left-sided pneumothorax has been increasing since that time with increased  oxygen requirement. The patient now presents for new left chest tube placement. EXAM: CT-GUIDED INSERTION OF PERCUTANEOUS PLEURAL DRAINAGE CATHETER MEDICATIONS: No additional medications other than sedative medicines listed below. ANESTHESIA/SEDATION: Fentanyl 50 mcg IV; Versed 1.0 mg IV Moderate Sedation Time:  15 minutes. The patient was continuously monitored during the procedure by the interventional radiology nurse under my direct supervision. COMPLICATIONS: None immediate. PROCEDURE: Informed written consent was obtained from the patient after a thorough discussion of the procedural risks, benefits and alternatives. All questions were addressed. Maximal Sterile Barrier Technique was utilized including caps, mask, sterile gowns, sterile gloves, sterile drape, hand hygiene and skin antiseptic. A timeout was performed prior to the  initiation of the procedure. CT was performed in a supine position through the mid to lower chest. After choosing a site for percutaneous entry along the lower anterior left chest wall, an 18 gauge trocar needle was advanced into the anterior pleural space. After confirming needle tip position, a guidewire was advanced and the needle removed. The tract was dilated and a 12 French pigtail catheter advanced into the pleural space. Catheter position was confirmed by CT after placement. The catheter was secured at the skin with a Prolene retention suture, StatLock device and Vaseline gauze dressing. The catheter was connected to a [employer] Pleur-evac device and attached to wall suction at -20 cm of water. FINDINGS: Since chest x-ray earlier this morning at 0900 hours, there is definite significant increase in size of the left pneumothorax now estimated to be 40-50% in volume. Bilateral lower lobe atelectasis present as well as small bilateral pleural effusions. Subcutaneous air is present in the anterior chest wall. The 12 French catheter was advanced into the lateral left pleural space and angled to ascend superiorly. IMPRESSION: Placement of 12 French left-sided pleural thoracostomy tube to treat recurrent and enlarging left pneumothorax. The catheter was attached to a Pleur-evac device and attached to wall suction at -20 cm of water. Electronically Signed   By: Aletta Edouard M.D.   On: 07/14/2018 08:17      Scheduled Meds: . atorvastatin  40 mg Oral Daily  . docusate sodium  100 mg Oral BID  . guaiFENesin  600 mg Oral BID  . ipratropium-albuterol  3 mL Nebulization TID  . lisinopril  2.5 mg Oral Daily  . metoprolol succinate  75 mg Oral Daily  . pantoprazole  40 mg Oral BID AC  . Warfarin - Pharmacist Dosing Inpatient   Does not apply q1800   Continuous Infusions:    LOS: 4 days    Time spent in minutes: [place], MD [facility] Pager:[phone] [email or web address] Password [email or web address] 07/14/2018, 11:46 AM

## 2018-07-14 NOTE — Progress Notes (Signed)
Per Md order Lopressor 25 mg given at 1700.  Pt hart rate fluctuating from low 100's to 130's on telemetry but low 100's on cont pulse ox. MD prefers to go by cont. Pulse ox monitor.  Pt continues asymptomatic.  Will continue to monitor.

## 2018-07-14 NOTE — Progress Notes (Signed)
Referring Physician(s): Grace Isaac  Supervising Physician: Aletta Edouard  Patient Status:  [facility] - In-pt  Chief Complaint:  Left PTX post bx 12/31 First chest tube pulled out accidentally 1/2 New chest tube placed 1/4  Subjective:  Feels better today Still with definite sub cu emphysema No air leak   Allergies: Gadolinium derivatives  Medications: Prior to Admission medications   Medication Sig Start Date End Date Taking? Authorizing Provider  atorvastatin (LIPITOR) 40 MG tablet TAKE ONE TABLET DAILY AT 6PM Patient taking differently: Take 40 mg by mouth daily.  11/13/17  Yes Burnell Blanks, MD  clopidogrel (PLAVIX) 75 MG tablet TAKE ONE TABLET EACH DAY Patient taking differently: Take 75 mg by mouth daily.  09/03/17  Yes Burnell Blanks, MD  lisinopril (PRINIVIL,ZESTRIL) 5 MG tablet Take 0.5 tablets (2.5 mg total) by mouth daily. 03/13/18  Yes Burnell Blanks, MD  metoprolol succinate (TOPROL-XL) 25 MG 24 hr tablet Take 3 tablets (75 mg total) by mouth daily. 01/07/18  Yes Burnell Blanks, MD  vitamin B-12 (CYANOCOBALAMIN) 1000 MCG tablet Take 1,000 mcg by mouth daily.   Yes [provider]  warfarin (COUMADIN) 5 MG tablet TAKE AS DIRECTED BY COUMADIN CLINIC Patient taking differently: Take 5-7.5 mg by mouth See admin instructions. Take as directed by coumadin clinic; 7.5 mg (5 mg x 1.5) every Fri; 5 mg (5 mg x 1) all other days 06/28/18  Yes Burnell Blanks, MD     Vital Signs: BP (!) 111/52 (BP Location: Right Arm)   Pulse 67   Temp 98.2 F (36.8 C) (Oral)   Resp 17   Ht 5\' 11"  (1.803 m)   Wt 173 lb 11.6 oz (78.8 kg)   SpO2 90%   BMI 24.23 kg/m   Physical Exam Vitals signs reviewed.  Pulmonary:     Effort: Pulmonary effort is normal.  Skin:    General: Skin is warm and dry.     Comments: +sub cu emphysema crackels when palpated About same as yesterday  Skin site for chest tube is clean and  dry NT No bleeding No air leak in CT  Neurological:     Mental Status: He is alert.     Imaging: Dg Chest 2 View  Result Date: 07/14/2018 CLINICAL DATA:  Pneumothorax. EXAM: CHEST - 2 VIEW COMPARISON:  07/13/2018, 07/10/2018 and CT 07/13/2018 FINDINGS: Interval placement of a left pigtail pleural catheter with tip over the lateral mid thorax. No surgical left-sided pneumothorax visualized. Lungs are hypoinflated with mild stable opacification over the medial lung bases likely small amount of layering pleural fluid and atelectasis. Small nodule opacity over the lateral left midlung not well seen on the recent prior exams. Minimal prominence of the central pulmonary vessels unchanged. Mild stable cardiomegaly. Moderate subcutaneous emphysema over the thorax unchanged. Remainder of the exam is unchanged. IMPRESSION: Stable medial bibasilar opacification likely layering effusions with atelectasis. Left pleural catheter in place. No residual left-sided pneumothorax visualized. Small nodular density over the lateral left midlung not well seen on recent prior exams. Recommend attention on follow-up. Electronically Signed   By: Marin Olp M.D.   On: 07/14/2018 07:16   Dg Chest 2 View  Result Date: 07/13/2018 CLINICAL DATA:  Left pneumothorax after recent lung biopsy. EXAM: CHEST - 2 VIEW COMPARISON:  07/12/2018 FINDINGS: The heart size and mediastinal contours are within normal limits. There is slight enlargement of left apical and lateral pneumothorax estimated to be roughly 10-15% in volume. There  also is increase in subcutaneous emphysema across the chest wall and extending into the neck. Bibasilar atelectasis present. No pulmonary edema or pleural fluid identified. The visualized skeletal structures are unremarkable. IMPRESSION: Slight enlargement of left apical and lateral pneumothorax estimated to be 10-15% in volume. There also is increase in subcutaneous emphysema in the chest wall and neck.  Electronically Signed   By: Aletta Edouard M.D.   On: 07/13/2018 09:24   Dg Chest Port 1 View  Result Date: 07/12/2018 CLINICAL DATA:  Left pneumothorax. EXAM: PORTABLE CHEST 1 VIEW COMPARISON:  Radiograph of July 11, 2018. FINDINGS: Stable cardiomediastinal silhouette. Atherosclerosis of thoracic aorta is noted. Right lung is clear. Stable mild left apical pneumothorax is noted with minimal left basilar component. Mild left basilar subsegmental atelectasis is noted. Mild amount of subcutaneous emphysema seen over left lateral chest wall left supraclavicular region. Bony thorax is unremarkable. IMPRESSION: Mild left apical pneumothorax with minimal left basilar component. Subcutaneous emphysema is seen over left lateral chest wall supraclavicular region. Aortic Atherosclerosis (ICD10-I70.0). Electronically Signed   By: Marijo Conception, M.D.   On: 07/12/2018 08:19   Dg Chest Port 1 View  Result Date: 07/11/2018 CLINICAL DATA:  Follow-up left pneumothorax. EXAM: PORTABLE CHEST 1 VIEW COMPARISON:  Chest x-ray from yesterday. FINDINGS: Interval removal of the left pigtail chest tube with recurrent small left pneumothorax. The heart size and mediastinal contours are within normal limits. Normal pulmonary vascularity. No consolidation or pleural effusion. No acute osseous abnormality. Subcutaneous emphysema in the left chest wall. IMPRESSION: 1. Recurrent small left pneumothorax. These results will be called to the ordering clinician or representative by the Radiologist Assistant, and communication documented in the PACS or zVision Dashboard. Electronically Signed   By: Titus Dubin M.D.   On: 07/11/2018 17:00   Ct Perc Pleural Drain W/indwell Cath W/img Guide  Result Date: 07/14/2018 INDICATION: Recurrent and enlarging left pneumothorax following percutaneous biopsy of left upper lobe lung nodule on 07/09/2018. A pigtail chest tube was placed at that time and inadvertently removed on 07/11/2018. Left-sided  pneumothorax has been increasing since that time with increased oxygen requirement. The patient now presents for new left chest tube placement. EXAM: CT-GUIDED INSERTION OF PERCUTANEOUS PLEURAL DRAINAGE CATHETER MEDICATIONS: No additional medications other than sedative medicines listed below. ANESTHESIA/SEDATION: Fentanyl 50 mcg IV; Versed 1.0 mg IV Moderate Sedation Time:  15 minutes. The patient was continuously monitored during the procedure by the interventional radiology nurse under my direct supervision. COMPLICATIONS: None immediate. PROCEDURE: Informed written consent was obtained from the patient after a thorough discussion of the procedural risks, benefits and alternatives. All questions were addressed. Maximal Sterile Barrier Technique was utilized including caps, mask, sterile gowns, sterile gloves, sterile drape, hand hygiene and skin antiseptic. A timeout was performed prior to the initiation of the procedure. CT was performed in a supine position through the mid to lower chest. After choosing a site for percutaneous entry along the lower anterior left chest wall, an 18 gauge trocar needle was advanced into the anterior pleural space. After confirming needle tip position, a guidewire was advanced and the needle removed. The tract was dilated and a 12 French pigtail catheter advanced into the pleural space. Catheter position was confirmed by CT after placement. The catheter was secured at the skin with a Prolene retention suture, StatLock device and Vaseline gauze dressing. The catheter was connected to a Sahara Pleur-evac device and attached to wall suction at -20 cm of water. FINDINGS: Since chest x-ray earlier  this morning at 0900 hours, there is definite significant increase in size of the left pneumothorax now estimated to be 40-50% in volume. Bilateral lower lobe atelectasis present as well as small bilateral pleural effusions. Subcutaneous air is present in the anterior chest wall. The 12 French  catheter was advanced into the lateral left pleural space and angled to ascend superiorly. IMPRESSION: Placement of 12 French left-sided pleural thoracostomy tube to treat recurrent and enlarging left pneumothorax. The catheter was attached to a Pleur-evac device and attached to wall suction at -20 cm of water. Electronically Signed   By: Aletta Edouard M.D.   On: 07/14/2018 08:17    Labs:  CBC: Recent Labs    12/29/17 0507 07/09/18 0941 07/10/18 0225 07/13/18 0303  WBC 8.6 8.4 7.2 8.4  HGB 11.5* 13.5 12.4* 12.9*  HCT 35.6* 43.2 38.9* 39.3  PLT 168 231 198 235    COAGS: Recent Labs    06/27/18 1320 07/09/18 0941 07/13/18 0834 07/14/18 0532  INR 3.2* 1.17 1.20 1.29    BMP: Recent Labs    07/09/18 1901 07/10/18 0225 07/13/18 0303 07/14/18 0532  NA 139 137 136 135  K 4.3 4.1 4.2 4.2  CL 103 104 101 101  CO2 26 25 30 26   GLUCOSE 148* 149* 129* 118*  BUN 18 20 23  37*  CALCIUM 9.0 8.7* 8.6* 8.3*  CREATININE 1.12 1.23 1.28* 1.81*  GFRNONAA 59* 53* 50* 33*  GFRAA >60 >60 58* 38*    LIVER FUNCTION TESTS: No results for input(s): BILITOT, AST, ALT, ALKPHOS, PROT, ALBUMIN in the last 8760 hours.  Assessment and Plan:  Left chest tube intact Will follow  Electronically Signed: Lavonia Drafts, PA-C 07/14/2018, 8:36 AM   I spent a total of 15 Minutes at the the patient's bedside AND on the patient's hospital floor or unit, greater than 50% of which was counseling/coordinating care for left chest tube

## 2018-07-14 NOTE — Progress Notes (Signed)
Pt to Xray.

## 2018-07-14 NOTE — Progress Notes (Signed)
Telemetry called, Pt has rans of VT. Checked pt in room. Pt resting quietly in bed. Alert when awaken. Pt denies SOB. Complains of pain, Pain med given. Will monitor pt.

## 2018-07-14 NOTE — Progress Notes (Signed)
ANTICOAGULATION CONSULT NOTE - Follow Up Consult  Pharmacy Consult for Lovenox and Coumadin Indication: atrial fibrillation  Allergies  Allergen Reactions  . Gadolinium Derivatives Hives, Itching and Other (See Comments)    Dr. Jeralyn Ruths s/w Mr. Trull. We observed him for 15 minutes. He did not need to take Benadryl. The symptoms began to subside before he left.     Patient Measurements: Height: 5\' 11"  (180.3 cm) Weight: 173 lb 11.6 oz (78.8 kg) IBW/kg (Calculated) : 75.3  Vital Signs: Temp: 98 F (36.7 C) (01/05 1442) Temp Source: Oral (01/05 1442) BP: 124/90 (01/05 1442) Pulse Rate: 62 (01/05 1442)  Labs: Recent Labs    07/13/18 0303 07/13/18 0834 07/14/18 0532  HGB 12.9*  --   --   HCT 39.3  --   --   PLT 235  --   --   LABPROT  --  15.1 16.0*  INR  --  1.20 1.29  CREATININE 1.28*  --  1.81*    Estimated Creatinine Clearance: 31.2 mL/min (A) (by C-G formula based on SCr of 1.81 mg/dL (H)).  Assessment: 85 year old male on Coumadin 5mg  daily PTA exc 7.5mg  on Fri PTA for Afib. Admit INR 1.17. Coumadin held for lung biopsy, then developed pneumothorax requiring tube placement. Then restarted anticoag but now requiring a new chest tube on 1/4. Hgb 12.9, plts wnl.  Goal of Therapy:  Anti-Xa level 0.6-1 units/ml 4hrs after LMWH dose given Monitor platelets by anticoagulation protocol: Yes   Plan:  Hold Lovenox and Coumadin until MD ok with resuming Monitor daily INR, CBC, s/s of bleed  Elenor Quinones, PharmD, BCPS, [facility] Clinical Pharmacist Phone number [phone] 07/14/2018 3:16 PM

## 2018-07-14 NOTE — Progress Notes (Addendum)
Notified by telemetry that patient was having several runs of PVC episodes the longest being 35 beats.  Pt is asymptomatic, sitting up in his chair.  Md notified via amion and by phone.  Will continue to monitor.

## 2018-07-15 LAB — COMPREHENSIVE METABOLIC PANEL
ALT: 16 U/L (ref 0–44)
ANION GAP: 8 (ref 5–15)
AST: 19 U/L (ref 15–41)
Albumin: 2.2 g/dL — ABNORMAL LOW (ref 3.5–5.0)
Alkaline Phosphatase: 63 U/L (ref 38–126)
BUN: 35 mg/dL — ABNORMAL HIGH (ref 8–23)
CALCIUM: 8.4 mg/dL — AB (ref 8.9–10.3)
CO2: 24 mmol/L (ref 22–32)
Chloride: 103 mmol/L (ref 98–111)
Creatinine, Ser: 1.47 mg/dL — ABNORMAL HIGH (ref 0.61–1.24)
GFR calc Af Amer: 49 mL/min — ABNORMAL LOW (ref >60)
GFR calc non Af Amer: 43 mL/min — ABNORMAL LOW (ref >60)
Glucose, Bld: 138 mg/dL — ABNORMAL HIGH (ref 70–99)
Potassium: 4.3 mmol/L (ref 3.5–5.1)
Sodium: 135 mmol/L (ref 135–145)
TOTAL PROTEIN: 6 g/dL — AB (ref 6.5–8.1)
Total Bilirubin: 1.1 mg/dL (ref 0.3–1.2)

## 2018-07-15 LAB — PROTIME-INR
INR: 1.33
Prothrombin Time: 16.4 seconds — ABNORMAL HIGH (ref 11.4–15.2)

## 2018-07-15 MED ORDER — WARFARIN SODIUM 7.5 MG PO TABS
7.5000 mg | ORAL_TABLET | Freq: Once | ORAL | Status: AC
  Administered 2018-07-15: 7.5 mg via ORAL
  Filled 2018-07-15: qty 1

## 2018-07-15 MED ORDER — IPRATROPIUM-ALBUTEROL 0.5-2.5 (3) MG/3ML IN SOLN
3.0000 mL | Freq: Two times a day (BID) | RESPIRATORY_TRACT | Status: DC
  Administered 2018-07-15 – 2018-07-16 (×2): 3 mL via RESPIRATORY_TRACT
  Filled 2018-07-15 (×2): qty 3

## 2018-07-15 NOTE — Progress Notes (Signed)
Referring Physician(s): Grace Isaac  Supervising Physician: Arne Cleveland  Patient Status:  [facility] - In-pt  Chief Complaint: Follow up left chest tube s/p post procedure pneumothorax -- originally placed 12/31, dislodged 1/2, replaced 1/4  Subjective:  Patient sitting up in bed eating lunch tray, denies any complaints currently, is looking forward to having the chest tube removed soon so he can go home. He states he was told that "there are no more bubbles so it looks good for it to come out soon."   Allergies: Gadolinium derivatives  Medications: Prior to Admission medications   Medication Sig Start Date End Date Taking? Authorizing Provider  atorvastatin (LIPITOR) 40 MG tablet TAKE ONE TABLET DAILY AT 6PM Patient taking differently: Take 40 mg by mouth daily.  11/13/17  Yes Burnell Blanks, MD  clopidogrel (PLAVIX) 75 MG tablet TAKE ONE TABLET EACH DAY Patient taking differently: Take 75 mg by mouth daily.  09/03/17  Yes Burnell Blanks, MD  lisinopril (PRINIVIL,ZESTRIL) 5 MG tablet Take 0.5 tablets (2.5 mg total) by mouth daily. 03/13/18  Yes Burnell Blanks, MD  metoprolol succinate (TOPROL-XL) 25 MG 24 hr tablet Take 3 tablets (75 mg total) by mouth daily. 01/07/18  Yes Burnell Blanks, MD  vitamin B-12 (CYANOCOBALAMIN) 1000 MCG tablet Take 1,000 mcg by mouth daily.   Yes [provider]  warfarin (COUMADIN) 5 MG tablet TAKE AS DIRECTED BY COUMADIN CLINIC Patient taking differently: Take 5-7.5 mg by mouth See admin instructions. Take as directed by coumadin clinic; 7.5 mg (5 mg x 1.5) every Fri; 5 mg (5 mg x 1) all other days 06/28/18  Yes Burnell Blanks, MD     Vital Signs: BP 109/60   Pulse 77   Temp 98.2 F (36.8 C) (Oral)   Resp 19   Ht 5\' 11"  (1.803 m)   Wt 173 lb 11.6 oz (78.8 kg)   SpO2 90%   BMI 24.23 kg/m   Physical Exam Vitals signs and nursing note reviewed.  Constitutional:      General: He is not in  acute distress. HENT:     Head: Normocephalic and atraumatic.  Cardiovascular:     Rate and Rhythm: Normal rate and regular rhythm.  Pulmonary:     Effort: Pulmonary effort is normal.     Breath sounds: Normal breath sounds.     Comments: (+) Left sided chest tube - remains to suction, ~10 cc serosanguineous OP in pleur vac, (-) air leak, insertion site unremarkable.  Skin:    General: Skin is warm and dry.  Neurological:     Mental Status: He is alert. Mental status is at baseline.     Imaging: Dg Chest 2 View  Result Date: 07/14/2018 CLINICAL DATA:  Pneumothorax. EXAM: CHEST - 2 VIEW COMPARISON:  07/13/2018, 07/10/2018 and CT 07/13/2018 FINDINGS: Interval placement of a left pigtail pleural catheter with tip over the lateral mid thorax. No surgical left-sided pneumothorax visualized. Lungs are hypoinflated with mild stable opacification over the medial lung bases likely small amount of layering pleural fluid and atelectasis. Small nodule opacity over the lateral left midlung not well seen on the recent prior exams. Minimal prominence of the central pulmonary vessels unchanged. Mild stable cardiomegaly. Moderate subcutaneous emphysema over the thorax unchanged. Remainder of the exam is unchanged. IMPRESSION: Stable medial bibasilar opacification likely layering effusions with atelectasis. Left pleural catheter in place. No residual left-sided pneumothorax visualized. Small nodular density over the lateral left midlung not well seen  on recent prior exams. Recommend attention on follow-up. Electronically Signed   By: Marin Olp M.D.   On: 07/14/2018 07:16   Dg Chest 2 View  Result Date: 07/13/2018 CLINICAL DATA:  Left pneumothorax after recent lung biopsy. EXAM: CHEST - 2 VIEW COMPARISON:  07/12/2018 FINDINGS: The heart size and mediastinal contours are within normal limits. There is slight enlargement of left apical and lateral pneumothorax estimated to be roughly 10-15% in volume. There also  is increase in subcutaneous emphysema across the chest wall and extending into the neck. Bibasilar atelectasis present. No pulmonary edema or pleural fluid identified. The visualized skeletal structures are unremarkable. IMPRESSION: Slight enlargement of left apical and lateral pneumothorax estimated to be 10-15% in volume. There also is increase in subcutaneous emphysema in the chest wall and neck. Electronically Signed   By: Aletta Edouard M.D.   On: 07/13/2018 09:24   Dg Chest Port 1 View  Result Date: 07/12/2018 CLINICAL DATA:  Left pneumothorax. EXAM: PORTABLE CHEST 1 VIEW COMPARISON:  Radiograph of July 11, 2018. FINDINGS: Stable cardiomediastinal silhouette. Atherosclerosis of thoracic aorta is noted. Right lung is clear. Stable mild left apical pneumothorax is noted with minimal left basilar component. Mild left basilar subsegmental atelectasis is noted. Mild amount of subcutaneous emphysema seen over left lateral chest wall left supraclavicular region. Bony thorax is unremarkable. IMPRESSION: Mild left apical pneumothorax with minimal left basilar component. Subcutaneous emphysema is seen over left lateral chest wall supraclavicular region. Aortic Atherosclerosis (ICD10-I70.0). Electronically Signed   By: Marijo Conception, M.D.   On: 07/12/2018 08:19   Dg Chest Port 1 View  Result Date: 07/11/2018 CLINICAL DATA:  Follow-up left pneumothorax. EXAM: PORTABLE CHEST 1 VIEW COMPARISON:  Chest x-ray from yesterday. FINDINGS: Interval removal of the left pigtail chest tube with recurrent small left pneumothorax. The heart size and mediastinal contours are within normal limits. Normal pulmonary vascularity. No consolidation or pleural effusion. No acute osseous abnormality. Subcutaneous emphysema in the left chest wall. IMPRESSION: 1. Recurrent small left pneumothorax. These results will be called to the ordering clinician or representative by the Radiologist Assistant, and communication documented in the  PACS or zVision Dashboard. Electronically Signed   By: Titus Dubin M.D.   On: 07/11/2018 17:00   Ct Perc Pleural Drain W/indwell Cath W/img Guide  Result Date: 07/14/2018 INDICATION: Recurrent and enlarging left pneumothorax following percutaneous biopsy of left upper lobe lung nodule on 07/09/2018. A pigtail chest tube was placed at that time and inadvertently removed on 07/11/2018. Left-sided pneumothorax has been increasing since that time with increased oxygen requirement. The patient now presents for new left chest tube placement. EXAM: CT-GUIDED INSERTION OF PERCUTANEOUS PLEURAL DRAINAGE CATHETER MEDICATIONS: No additional medications other than sedative medicines listed below. ANESTHESIA/SEDATION: Fentanyl 50 mcg IV; Versed 1.0 mg IV Moderate Sedation Time:  15 minutes. The patient was continuously monitored during the procedure by the interventional radiology nurse under my direct supervision. COMPLICATIONS: None immediate. PROCEDURE: Informed written consent was obtained from the patient after a thorough discussion of the procedural risks, benefits and alternatives. All questions were addressed. Maximal Sterile Barrier Technique was utilized including caps, mask, sterile gowns, sterile gloves, sterile drape, hand hygiene and skin antiseptic. A timeout was performed prior to the initiation of the procedure. CT was performed in a supine position through the mid to lower chest. After choosing a site for percutaneous entry along the lower anterior left chest wall, an 18 gauge trocar needle was advanced into the anterior pleural space.  After confirming needle tip position, a guidewire was advanced and the needle removed. The tract was dilated and a 12 French pigtail catheter advanced into the pleural space. Catheter position was confirmed by CT after placement. The catheter was secured at the skin with a Prolene retention suture, StatLock device and Vaseline gauze dressing. The catheter was connected to a  Sahara Pleur-evac device and attached to wall suction at -20 cm of water. FINDINGS: Since chest x-ray earlier this morning at 0900 hours, there is definite significant increase in size of the left pneumothorax now estimated to be 40-50% in volume. Bilateral lower lobe atelectasis present as well as small bilateral pleural effusions. Subcutaneous air is present in the anterior chest wall. The 12 French catheter was advanced into the lateral left pleural space and angled to ascend superiorly. IMPRESSION: Placement of 12 French left-sided pleural thoracostomy tube to treat recurrent and enlarging left pneumothorax. The catheter was attached to a Pleur-evac device and attached to wall suction at -20 cm of water. Electronically Signed   By: Aletta Edouard M.D.   On: 07/14/2018 08:17    Labs:  CBC: Recent Labs    12/29/17 0507 07/09/18 0941 07/10/18 0225 07/13/18 0303  WBC 8.6 8.4 7.2 8.4  HGB 11.5* 13.5 12.4* 12.9*  HCT 35.6* 43.2 38.9* 39.3  PLT 168 231 198 235    COAGS: Recent Labs    07/09/18 0941 07/13/18 0834 07/14/18 0532 07/15/18 0150  INR 1.17 1.20 1.29 1.33    BMP: Recent Labs    07/13/18 0303 07/14/18 0532 07/14/18 1628 07/15/18 0150  NA 136 135 134* 135  K 4.2 4.2 4.2 4.3  CL 101 101 100 103  CO2 30 26 24 24   GLUCOSE 129* 118* 165* 138*  BUN 23 37* 38* 35*  CALCIUM 8.6* 8.3* 8.7* 8.4*  CREATININE 1.28* 1.81* 1.70* 1.47*  GFRNONAA 50* 33* 36* 43*  GFRAA 58* 38* 41* 49*    LIVER FUNCTION TESTS: Recent Labs    07/15/18 0150  BILITOT 1.1  AST 19  ALT 16  ALKPHOS 63  PROT 6.0*  ALBUMIN 2.2*    Assessment and Plan:  Patient s/p left chest tube placement follow lung biopsy in IR on 12/31 -- original tube was placed 12/31, dislodged 1/2, replaced 1/4 due to enlarging ptx. Tube remains to suction currently, no air leak noted, 10 cc serosanguineous OP in pleur vac. Patient denies dyspnea/chest pain. Follow up CXR yesterday AM shows no residual left  pneumothorax.   Will place to water seal now, repeat CXR AM of 1/7 to evaluate for possible removal.   Please call IR with questions or concerns.  Electronically Signed: Joaquim Nam, PA-C 07/15/2018, 3:43 PM   I spent a total of 25 Minutes at the the patient's bedside AND on the patient's hospital floor or unit, greater than 50% of which was counseling/coordinating care for follow up left chest tube.

## 2018-07-15 NOTE — Progress Notes (Signed)
ANTICOAGULATION CONSULT NOTE  Pharmacy Consult:  Lovenox / Coumadin Indication: atrial fibrillation  Allergies  Allergen Reactions  . Gadolinium Derivatives Hives, Itching and Other (See Comments)    Dr. Jeralyn Ruths s/w [name]. We observed him for 15 minutes. He did not need to take Benadryl. The symptoms began to subside before he left.     Patient Measurements: Height: 5\' 11"  (180.3 cm) Weight: 173 lb 11.6 oz (78.8 kg) IBW/kg (Calculated) : 75.3  Vital Signs: Temp: 98.2 F (36.8 C) (01/06 1322) Temp Source: Oral (01/06 1322) BP: 109/60 (01/06 1322) Pulse Rate: 77 (01/06 1322)  Labs: Recent Labs    07/13/18 0303 07/13/18 0834 07/14/18 0532 07/14/18 1628 07/15/18 0150  HGB 12.9*  --   --   --   --   HCT 39.3  --   --   --   --   PLT 235  --   --   --   --   LABPROT  --  15.1 16.0*  --  16.4*  INR  --  1.20 1.29  --  1.33  CREATININE 1.28*  --  1.81* 1.70* 1.47*  TROPONINI  --   --   --  <0.03  --     Estimated Creatinine Clearance: 38.4 mL/min (A) (by C-G formula based on SCr of 1.47 mg/dL (H)).  Assessment: 85 year old male on Coumadin 5mg  daily PTA except 7.5mg  on Fri PTA for Afib.  Coumadin held for lung biopsy, then developed pneumothorax requiring tube placement. Then restarted AC but now requiring a new chest tube on 1/4 and AC held again.  Spoke to Dr. Allyson Sabal, okay to resume Coumadin.  INR sub-therapeutic as expected.  No bleeding reported.  Goal of Therapy:  INR 2 - 3 Anti-Xa level 0.6-1 units/ml 4hrs after LMWH dose given Monitor platelets by anticoagulation protocol: Yes    Plan:  Coumadin 7.5mg  PO today Daily PT / INR  F/U with restarting Lovenox bridge   [name], PharmD, BCPS, [place] 07/15/2018, 2:19 PM

## 2018-07-15 NOTE — Progress Notes (Signed)
PROGRESS NOTE    [name]   RKY:[id number]  DOB: Jun 22, 1930  DOA: 07/09/2018 PCP: Marton Redwood, MD   Brief Narrative:  [name]   is a 85 y.o. male with medical history significant of PVD; CAD; HTN; HLD; CKD; and afib.   He came to have a lung biopsy by IR done on 07/09/18, and subsequently developed a pneumothorax. A chest tube was placed by IR same day, and [facility] were asked to admit.    Subjective:  chest x-ray last night looked reassuring, patient wants to know when the chest tube will be removed He has chest pain with deep inspiration at the site of the chest tube     Assessment & Plan:    Pneumothorax of left lung after biopsy - sp biopsy of LUL nodule done 07/09/18 followed by development of pneumothorax - required placement of L chest tube (pigtail) for procedure related PTX 07/09/18 - admitted to [facility] after CT placed - accidentally pt pulled out his chest tube on 07/11/18 in the evening - IR put in new Left chest thoracostomy tube placement under CT guidance 07/13/18  On 07/14/18 chest x-ray showed complete reexpansion of the left lung and no pneumothorax. No air leak on exam.recommend leaving pigtail to wall suction at least another 24-48 hours given recurrence of PTX requiring 2 chest tubes IR to remove chest tube when appropriate  AKI Pt seems dry Gentle hydration with f/u of renal function-improving, down from 1.81>1.47 renal ultrasound could be considered if a AKI persist   Paroxysmal  Atrial fibrillation  CHADsVASC2 = 4 Dose of Metoprolol increased for better rate control- watch BP Blood pressure borderline low Consulted cardiology for further management Coumadin  Being dosed by pharmacy  CAD without angina severe 2V CAD hx recurrent stents in LAD and LCx.  At home takes plavix, statin and beta blocker.   Resume Plavix   HTN BP is controlled. No changes  Ischemic cardiomyopathy EF 40% in 07/2017 improved to 60% when checked  in Sept 2019, prob due to revasc / PCI procedures done in January . Continue the beta blocker.     Lung nodule PET positive f/u on biopsy results from 07/09/18     DVT prophylaxis: lovenox  Code Status: DNR Family Communication: none present Disposition Plan: patient will need PT OT evaluation, cardiology consult, possible placement    Consultants:   IR  Cardiology   Procedures:   Chest tube Antimicrobials:  Anti-infectives (From admission, onward)   None       Objective: Vitals:   07/14/18 1739 07/14/18 2040 07/14/18 2040 07/15/18 0439  BP: (!) 113/58  115/68 122/70  Pulse: (!) 134 93 81 84  Resp:  18 19 19   Temp: 97.8 F (36.6 C)  98.4 F (36.9 C) 98.1 F (36.7 C)  TempSrc: Oral  Oral Oral  SpO2:  93% 91% 94%  Weight:      Height:        Intake/Output Summary (Last 24 hours) at 07/15/2018 0800 Last data filed at 07/15/2018 0521 Gross per 24 hour  Intake 875.88 ml  Output 810 ml  Net 65.88 ml   Filed Weights   07/13/18 0435 07/13/18 0440 07/14/18 0500  Weight: 78.8 kg 78.8 kg 78.8 kg    Examination: General exam: Appears comfortable  HEENT: PERRLA, oral mucosa moist, no sclera icterus or thrush Respiratory system: Diminished BS(+). Respiratory effort normal. Left Chest tube in place Cardiovascular system: S1 & S2 heard,  No murmurs  Gastrointestinal system: Abdomen soft, non-tender, nondistended. Normal bowel sound. No organomegaly Central nervous system: Alert and oriented. No focal neurological deficits. Extremities: No cyanosis, clubbing or edema Skin: No rashes or ulcers Psychiatry:  Mood & affect appropriate. .     Data Reviewed: I have personally reviewed following labs and imaging studies  CBC: Recent Labs  Lab 07/09/18 0941 07/10/18 0225 07/13/18 0303  WBC 8.4 7.2 8.4  HGB 13.5 12.4* 12.9*  HCT 43.2 38.9* 39.3  MCV 88.7 87.4 88.5  PLT 231 198 443   Basic Metabolic Panel: Recent Labs  Lab 07/10/18 0225 07/13/18 0303  07/14/18 0532 07/14/18 1628 07/15/18 0150  NA 137 136 135 134* 135  K 4.1 4.2 4.2 4.2 4.3  CL 104 101 101 100 103  CO2 25 30 26 24 24   GLUCOSE 149* 129* 118* 165* 138*  BUN 20 23 37* 38* 35*  CREATININE 1.23 1.28* 1.81* 1.70* 1.47*  CALCIUM 8.7* 8.6* 8.3* 8.7* 8.4*  MG  --   --   --  1.8  --   PHOS  --   --   --  3.8  --    GFR: Estimated Creatinine Clearance: 38.4 mL/min (A) (by C-G formula based on SCr of 1.47 mg/dL (H)). Liver Function Tests: Recent Labs  Lab 07/15/18 0150  AST 19  ALT 16  ALKPHOS 63  BILITOT 1.1  PROT 6.0*  ALBUMIN 2.2*   No results for input(s): LIPASE, AMYLASE in the last 168 hours. No results for input(s): AMMONIA in the last 168 hours. Coagulation Profile: Recent Labs  Lab 07/09/18 0941 07/13/18 0834 07/14/18 0532 07/15/18 0150  INR 1.17 1.20 1.29 1.33   Cardiac Enzymes: Recent Labs  Lab 07/14/18 1628  TROPONINI <0.03   BNP (last 3 results) No results for input(s): PROBNP in the last 8760 hours. HbA1C: No results for input(s): HGBA1C in the last 72 hours. CBG: No results for input(s): GLUCAP in the last 168 hours. Lipid Profile: No results for input(s): CHOL, HDL, LDLCALC, TRIG, CHOLHDL, LDLDIRECT in the last 72 hours. Thyroid Function Tests: No results for input(s): TSH, T4TOTAL, FREET4, T3FREE, THYROIDAB in the last 72 hours. Anemia Panel: No results for input(s): VITAMINB12, FOLATE, FERRITIN, TIBC, IRON, RETICCTPCT in the last 72 hours. Urine analysis: @LABRCNTIP (procalcitonin:4,lacticidven:4) )No results found for this or any previous visit (from the past 240 hour(s)).    Radiology Studies: Dg Chest 2 View  Result Date: 07/14/2018 CLINICAL DATA:  Pneumothorax. EXAM: CHEST - 2 VIEW COMPARISON:  07/13/2018, 07/10/2018 and CT 07/13/2018 FINDINGS: Interval placement of a left pigtail pleural catheter with tip over the lateral mid thorax. No surgical left-sided pneumothorax visualized. Lungs are hypoinflated with mild stable  opacification over the medial lung bases likely small amount of layering pleural fluid and atelectasis. Small nodule opacity over the lateral left midlung not well seen on the recent prior exams. Minimal prominence of the central pulmonary vessels unchanged. Mild stable cardiomegaly. Moderate subcutaneous emphysema over the thorax unchanged. Remainder of the exam is unchanged. IMPRESSION: Stable medial bibasilar opacification likely layering effusions with atelectasis. Left pleural catheter in place. No residual left-sided pneumothorax visualized. Small nodular density over the lateral left midlung not well seen on recent prior exams. Recommend attention on follow-up. Electronically Signed   By: Marin Olp M.D.   On: 07/14/2018 07:16   Dg Chest 2 View  Result Date: 07/13/2018 CLINICAL DATA:  Left pneumothorax after recent lung biopsy. EXAM: CHEST - 2 VIEW COMPARISON:  07/12/2018  FINDINGS: The heart size and mediastinal contours are within normal limits. There is slight enlargement of left apical and lateral pneumothorax estimated to be roughly 10-15% in volume. There also is increase in subcutaneous emphysema across the chest wall and extending into the neck. Bibasilar atelectasis present. No pulmonary edema or pleural fluid identified. The visualized skeletal structures are unremarkable. IMPRESSION: Slight enlargement of left apical and lateral pneumothorax estimated to be 10-15% in volume. There also is increase in subcutaneous emphysema in the chest wall and neck. Electronically Signed   By: Aletta Edouard M.D.   On: 07/13/2018 09:24   Ct Perc Pleural Drain W/indwell Cath W/img Guide  Result Date: 07/14/2018 INDICATION: Recurrent and enlarging left pneumothorax following percutaneous biopsy of left upper lobe lung nodule on 07/09/2018. A pigtail chest tube was placed at that time and inadvertently removed on 07/11/2018. Left-sided pneumothorax has been increasing since that time with increased oxygen  requirement. The patient now presents for new left chest tube placement. EXAM: CT-GUIDED INSERTION OF PERCUTANEOUS PLEURAL DRAINAGE CATHETER MEDICATIONS: No additional medications other than sedative medicines listed below. ANESTHESIA/SEDATION: Fentanyl 50 mcg IV; Versed 1.0 mg IV Moderate Sedation Time:  15 minutes. The patient was continuously monitored during the procedure by the interventional radiology nurse under my direct supervision. COMPLICATIONS: None immediate. PROCEDURE: Informed written consent was obtained from the patient after a thorough discussion of the procedural risks, benefits and alternatives. All questions were addressed. Maximal Sterile Barrier Technique was utilized including caps, mask, sterile gowns, sterile gloves, sterile drape, hand hygiene and skin antiseptic. A timeout was performed prior to the initiation of the procedure. CT was performed in a supine position through the mid to lower chest. After choosing a site for percutaneous entry along the lower anterior left chest wall, an 18 gauge trocar needle was advanced into the anterior pleural space. After confirming needle tip position, a guidewire was advanced and the needle removed. The tract was dilated and a 12 French pigtail catheter advanced into the pleural space. Catheter position was confirmed by CT after placement. The catheter was secured at the skin with a Prolene retention suture, StatLock device and Vaseline gauze dressing. The catheter was connected to a [place] Pleur-evac device and attached to wall suction at -20 cm of water. FINDINGS: Since chest x-ray earlier this morning at 0900 hours, there is definite significant increase in size of the left pneumothorax now estimated to be 40-50% in volume. Bilateral lower lobe atelectasis present as well as small bilateral pleural effusions. Subcutaneous air is present in the anterior chest wall. The 12 French catheter was advanced into the lateral left pleural space and angled  to ascend superiorly. IMPRESSION: Placement of 12 French left-sided pleural thoracostomy tube to treat recurrent and enlarging left pneumothorax. The catheter was attached to a Pleur-evac device and attached to wall suction at -20 cm of water. Electronically Signed   By: Aletta Edouard M.D.   On: 07/14/2018 08:17      Scheduled Meds: . atorvastatin  40 mg Oral Daily  . docusate sodium  100 mg Oral BID  . guaiFENesin  600 mg Oral BID  . ipratropium-albuterol  3 mL Nebulization TID  . lisinopril  2.5 mg Oral Daily  . metoprolol succinate  75 mg Oral Daily  . pantoprazole  40 mg Oral BID AC  . Warfarin - Pharmacist Dosing Inpatient   Does not apply q1800   Continuous Infusions: . sodium chloride 50 mL/hr at 07/15/18 0521     LOS:  5 days    Time spent in minutes: 30    Reyne Dumas, MD [facility] Pager:[phone] [email or web address] Password [email or web address] 07/15/2018, 8:00 AM

## 2018-07-16 ENCOUNTER — Encounter: Payer: Self-pay | Admitting: *Deleted

## 2018-07-16 ENCOUNTER — Encounter: Payer: Medicare Other | Admitting: Cardiothoracic Surgery

## 2018-07-16 ENCOUNTER — Inpatient Hospital Stay (HOSPITAL_COMMUNITY): Payer: Medicare Other

## 2018-07-16 ENCOUNTER — Telehealth: Payer: Self-pay | Admitting: *Deleted

## 2018-07-16 DIAGNOSIS — R911 Solitary pulmonary nodule: Secondary | ICD-10-CM

## 2018-07-16 LAB — COMPREHENSIVE METABOLIC PANEL
ALT: 19 U/L (ref 0–44)
AST: 23 U/L (ref 15–41)
Albumin: 2.3 g/dL — ABNORMAL LOW (ref 3.5–5.0)
Alkaline Phosphatase: 60 U/L (ref 38–126)
Anion gap: 8 (ref 5–15)
BUN: 27 mg/dL — ABNORMAL HIGH (ref 8–23)
CO2: 26 mmol/L (ref 22–32)
Calcium: 8.4 mg/dL — ABNORMAL LOW (ref 8.9–10.3)
Chloride: 103 mmol/L (ref 98–111)
Creatinine, Ser: 1.34 mg/dL — ABNORMAL HIGH (ref 0.61–1.24)
GFR calc non Af Amer: 48 mL/min — ABNORMAL LOW (ref >60)
GFR, EST AFRICAN AMERICAN: 55 mL/min — AB (ref >60)
Glucose, Bld: 117 mg/dL — ABNORMAL HIGH (ref 70–99)
Potassium: 3.9 mmol/L (ref 3.5–5.1)
Sodium: 137 mmol/L (ref 135–145)
Total Bilirubin: 0.9 mg/dL (ref 0.3–1.2)
Total Protein: 6 g/dL — ABNORMAL LOW (ref 6.5–8.1)

## 2018-07-16 LAB — PROTIME-INR
INR: 1.2
Prothrombin Time: 15.1 seconds (ref 11.4–15.2)

## 2018-07-16 MED ORDER — IPRATROPIUM-ALBUTEROL 0.5-2.5 (3) MG/3ML IN SOLN: 3.0000 mL | Freq: Four times a day (QID) | RESPIRATORY_TRACT | Status: DC | PRN

## 2018-07-16 MED ORDER — LISINOPRIL 5 MG PO TABS: 2.5000 mg | ORAL_TABLET | Freq: Every day | ORAL | Status: DC

## 2018-07-16 MED ORDER — WARFARIN SODIUM 7.5 MG PO TABS
7.5000 mg | ORAL_TABLET | Freq: Once | ORAL | Status: DC
  Filled 2018-07-16: qty 1

## 2018-07-16 MED ORDER — PANTOPRAZOLE SODIUM 40 MG PO TBEC: 40.0000 mg | DELAYED_RELEASE_TABLET | Freq: Every day | ORAL | 2 refills | Status: DC

## 2018-07-16 MED ORDER — GUAIFENESIN ER 600 MG PO TB12: 600.0000 mg | ORAL_TABLET | Freq: Two times a day (BID) | ORAL | 0 refills | Status: DC

## 2018-07-16 NOTE — Care Management Note (Signed)
Case Management Note  Patient Details  Name: [name] MRN: [id number] Date of Birth: 10/27/1929  Subjective/Objective:                    Action/Plan:  Patient requested NCM to call daughter [name] [phone]. Medicare.gov list provided over phone.   Referral given to [facility] with [facility] for home health and walker. Expected Discharge Date:  07/16/18               Expected Discharge Plan:  [place]  In-House Referral:     Discharge planning Services  CM Consult  Post Acute Care Choice:  Home Health, Durable Medical Equipment Choice offered to:  Patient  DME Arranged:  Walker rolling DME Agency:  [place] Arranged:  PT, OT, Nurse's Aide, Respirator Therapy [place] Agency:  [place]  Status of Service:  Completed, signed off  If discussed at [place] of Stay Meetings, dates discussed:    Additional Comments:  Marilu Favre, RN 07/16/2018, 3:31 PM

## 2018-07-16 NOTE — Consult Note (Signed)
[facility] CM Primary Care Navigator  07/16/2018  [name] 08-28-1929 [id number]   Met with patient and son (Sam) at the bedside toidentify possible discharge needs.   Patient reportsthat he came to the hospital for a lung biopsy by IR and subsequently developed a pneumothorax- requiring chest-tube placement. (left upper lobe pulmonary nodule, pneumothorax of left lung after biopsy, atrial fibrillation, HTN)  Patientconfirms Dr. Marton Redwood with [facility] ashisprimarycareprovider.   [facility] [address] [facility] without difficulty.  Patientstates that he has beenmanaging hisown medications at homewith use of "pill box" system filled once a week.  Patientreports thathe has been driving prior to admission but  his daughter [name]) will be able to provide transportation to his doctors' appointments after discharge.  He states that he lives alone but daughter lives next door to him and will serve as his primary caregiver at home.  Anticipated plan for dischargeishome with home health services per therapy recommendation.  Patient and sonvoiced understandingto call primarycare provider's office when hereturnshome,for a post discharge follow-up visit within 1- 2 weeksor sooner if needs arise.Patient letter (with PCP's contact number) was provided asareminder. Patient plans to call primary care provider's office to request for an earlier appointment than what was previously scheduled for February.  Discussed with patientand son regarding [facility] CM services available for health management andresourcesat home but both declined services including EMMI calls for follow-up and stated that home health services is what would be preferred for now. Patient's son verbalized being knowledgeable and well-informed of ways to manage patient's health conditions once patient gets  home. Patientand son expressedunderstanding todiscuss with primary care provider onhisnext visit,aboutfurther needsand available assistance that may be needed to manage hishealthissues when he gets backhome. Patient and sonvoiced understandingto seekreferral from primary care provider to [facility] care management ifdeemed necessary and appropriatefor anyservicesin the nearfuture.   [facility] care management information was provided for future needs that patientmay have.  Primary care provider's office is listed as providing transition of care (TOC) follow-up.    For additional questions please contact:  Edwena Felty A. Casimira Sutphin, BSN, RN-BC [facility] PRIMARY CARE Navigator Cell: [phone]

## 2018-07-16 NOTE — Progress Notes (Signed)
[name] to be D/C'd  per MD order. Discussed with the patient and all questions fully answered.  VSS, Skin clean, dry and intact without evidence of skin break down, no evidence of skin tears noted.  IV catheter discontinued intact. Site without signs and symptoms of complications. Dressing and pressure applied.  An After Visit Summary was printed and given to the patient. Patient received prescription.  D/c education completed with patient/family including follow up instructions, medication list, d/c activities limitations if indicated, with other d/c instructions as indicated by MD - patient able to verbalize understanding, all questions fully answered.   Patient instructed to return to ED, call 911, or call MD for any changes in condition.   Patient to be escorted via [place], and D/C home via private auto.

## 2018-07-16 NOTE — Care Management Important Message (Signed)
Important Message  Patient Details  Name: [name] MRN: [id number] Date of Birth: 1929/12/21   Medicare Important Message Given:  Yes    Chesney Klimaszewski 07/16/2018, 3:52 PM

## 2018-07-16 NOTE — Procedures (Signed)
Left sided pleural catheter removed at bedside without complication, catheter removed in tact. Patient tolerated procedure well.  Follow up CXR in 3 hours.  Please call IR with questions or concerns.    Candiss Norse, PA-C

## 2018-07-16 NOTE — Evaluation (Signed)
Occupational Therapy Evaluation Patient Details Name: [name] MRN: [id number] DOB: 05/05/1930 Today's Date: 07/16/2018    History of Present Illness [name] is a 84 y.o. male with medical history significant of PVD; CAD; HTN; HLD; CKD; and afib presenting for lung biopsy, who suffered a pneumothorax.   Clinical Impression   Pt is an 85 yo male s/p above diagnoses. Pt PTA: living alone, independent with ADLs and mobility with SPC intermittently. Currently, pt performing UB ADL with set-upA and LB ADL minguardA to minA in standing. Pt with fair balance when using BUE bilateral integration. Pt performing LB dressing and toilet hygiene with MinguardA; grooming at sink with set-upA in standing, fair balance. Pt performing ADL functional mobility with RW in room with minguardA to supervisionA due to L chest tube. Pt would benefit from continued OT skilled services for ADL, mobility and safety in [place] setting.     Follow Up Recommendations  Home health OT;Supervision - Intermittent    Equipment Recommendations  None recommended by OT    Recommendations for Other Services       Precautions / Restrictions Precautions Precautions: None Precaution Comments: L chest tube Restrictions Weight Bearing Restrictions: No      Mobility Bed Mobility Overal bed mobility: Needs Assistance             General bed mobility comments: up in recliner  Transfers Overall transfer level: Needs assistance Equipment used: Rolling walker (2 wheeled) Transfers: Sit to/from Stand Sit to Stand: Min guard;From elevated surface              Balance Overall balance assessment: Mild deficits observed, not formally tested                                         ADL either performed or assessed with clinical judgement   ADL Overall ADL's : Needs assistance/impaired Eating/Feeding: Set up;Cueing for safety;Sitting   Grooming: Wash/dry hands;Wash/dry face;Oral care    Upper Body Bathing: Min guard;Standing;Cueing for safety   Lower Body Bathing: Min guard;Sit to/from stand   Upper Body Dressing : Min guard;Sitting   Lower Body Dressing: Minimal assistance;Cueing for safety;Sitting/lateral leans;Sit to/from stand   Toilet Transfer: Min guard;RW;Grab bars   Toileting- Water quality scientist and Hygiene: Minimal assistance;Sit to/from stand;Sitting/lateral lean       Functional mobility during ADLs: Min guard(for L chest tube- requires 1+ to 2+) General ADL Comments: Increased time for tasks, but pt able to perform own ADL     Vision Baseline Vision/History: Wears glasses       Perception     Praxis      Pertinent Vitals/Pain Pain Assessment: No/denies pain     Hand Dominance Right   Extremity/Trunk Assessment Upper Extremity Assessment Upper Extremity Assessment: Overall WFL for tasks assessed;Generalized weakness   Lower Extremity Assessment Lower Extremity Assessment: Generalized weakness   Cervical / Trunk Assessment Cervical / Trunk Assessment: Normal   Communication Communication Communication: No difficulties   Cognition Arousal/Alertness: Awake/alert Behavior During Therapy: WFL for tasks assessed/performed Overall Cognitive Status: Within Functional Limits for tasks assessed                                     General Comments       Exercises     Shoulder Instructions  Home Living Family/patient expects to be discharged to:: Private residence Living Arrangements: Alone Available Help at Discharge: Family;Available PRN/intermittently Type of Home: House Home Access: Stairs to enter [employer] of Steps: 3 Entrance Stairs-Rails: Left Home Layout: One level     Bathroom Shower/Tub: Occupational psychologist: Standard     Home Equipment: Cane - single point          Prior Functioning/Environment Level of Independence: Independent with assistive device(s)                  OT Problem List: Decreased strength;Impaired balance (sitting and/or standing);Decreased safety awareness      OT Treatment/Interventions: Self-care/ADL training;Therapeutic exercise;Energy conservation;Therapeutic activities;Balance training    OT Goals(Current goals can be found in the care plan section) Acute Rehab OT Goals Patient Stated Goal: To go home. I have the wildlife dinner to plan OT Goal Formulation: With patient Time For Goal Achievement: 07/30/18 Potential to Achieve Goals: Good ADL Goals Pt Will Perform Lower Body Dressing: with set-up;sit to/from stand Pt Will Perform Toileting - Clothing Manipulation and hygiene: with set-up;sit to/from stand Additional ADL Goal #1: Pt will perform ADL functional transfers and ADL functional mobility with good safety awareness and modified independence.  OT Frequency: Min 2X/week   Barriers to D/C:            Co-evaluation              AM-PAC OT "6 Clicks" Daily Activity     Outcome Measure Help from another person eating meals?: None Help from another person taking care of personal grooming?: A Little Help from another person toileting, which includes using toliet, bedpan, or urinal?: A Little Help from another person bathing (including washing, rinsing, drying)?: A Little Help from another person to put on and taking off regular upper body clothing?: A Little Help from another person to put on and taking off regular lower body clothing?: A Little 6 Click Score: 19   End of Session Equipment Utilized During Treatment: Gait belt;Rolling walker Nurse Communication: Mobility status  Activity Tolerance: Patient tolerated treatment well;Patient limited by pain Patient left: in chair;with chair alarm set;with call bell/phone within reach  OT Visit Diagnosis: Unsteadiness on feet (R26.81);Muscle weakness (generalized) (M62.81)                Time: [phone] OT Time Calculation (min): 24  min Charges:  OT General Charges $OT Visit: 1 Visit OT Evaluation $OT Eval Moderate Complexity: 1 Mod OT Treatments $Self Care/Home Management : 8-22 mins  Ebony Hail Harold Hedge) Marsa Aris OTR/L Acute Rehabilitation Services Pager: [phone] Office: [phone]   Fredda Hammed 07/16/2018, 11:01 AM

## 2018-07-16 NOTE — Progress Notes (Signed)
ANTICOAGULATION CONSULT NOTE  Pharmacy Consult:  Coumadin Indication: atrial fibrillation  Allergies  Allergen Reactions  . Gadolinium Derivatives Hives, Itching and Other (See Comments)    Dr. Jeralyn Ruths s/w [name]. We observed him for 15 minutes. He did not need to take Benadryl. The symptoms began to subside before he left.     Patient Measurements: Height: 5\' 11"  (180.3 cm) Weight: 165 lb 8 oz (75.1 kg) IBW/kg (Calculated) : 75.3  Vital Signs: Temp: 97.9 F (36.6 C) (01/07 0547) Temp Source: Oral (01/07 0547) BP: 122/58 (01/07 1024) Pulse Rate: 70 (01/07 1024)  Labs: Recent Labs    07/14/18 0532 07/14/18 1628 07/15/18 0150 07/16/18 0410  LABPROT 16.0*  --  16.4* 15.1  INR 1.29  --  1.33 1.20  CREATININE 1.81* 1.70* 1.47* 1.34*  TROPONINI  --  <0.03  --   --     Estimated Creatinine Clearance: 42 mL/min (A) (by C-G formula based on SCr of 1.34 mg/dL (H)).  Assessment: 85 year old male on Coumadin 5mg  daily PTA except 7.5mg  on Fri PTA for Afib.  Coumadin held for lung biopsy, then developed pneumothorax requiring tube placement. Then restarted AC but now requiring a new chest tube on 1/4 and AC held again.  Coumadin resumed 07/15/18.  INR sub-therapeutic as expected.  No bleeding reported.  Goal of Therapy:  INR 2 - 3 Monitor platelets by anticoagulation protocol: Yes  Plan:  Repeat Coumadin 7.5mg  PO today Daily PT / INR F/U with restarting Lovenox bridge    [name] D. [name], PharmD, BCPS, [name] 07/16/2018, 10:37 AM

## 2018-07-16 NOTE — Progress Notes (Signed)
[address]       [place] [address]             [phone]                    [name] [place] Medical Record #[id number] Date of Birth: [birth year]  Referring: David Isaac, MD Primary Care: David Redwood, MD Primary Cardiologist: David Chandler, MD  Chief Complaint:    Lung mass    History of Present Illness:    [name] 85 y.o. male is seen in the office for evaluation of left lung, noted on ct of chest, pet scan done today. Patient has history of cad, recent coronary sten placement and afib.   Needle bx done last week with post procedure PTX, treated with chest tube , now planning to go home .  Path: Diagnosis Lung, needle/core biopsy(ies), Left upper Lobe - ADENOCARCINOMA. SEE COMMENT. Microscopic Comment A battery of immunohistochemical stains was performed. The lesion is strongly and diffusely positive for cytokeratin 7, TTF-1, and Napsin-A. It is negative for cytokeratin 20 and CDX-2. This immunohistochemical staining pattern supports a diagnosis of primary pulmonary adenocarcinoma. This case was reviewed by Dr. Saralyn Sandoval. (MJ:ah 07/12/18) David Martinique MD Pathologist, Electronic Signature (Case signed 07/12/2018)  Current Activity/ Functional Status:  Patient is independent with mobility/ambulation, transfers, ADL's, IADL's.   Zubrod Score: At the time of surgery this patient's most appropriate activity status/level should be described as: []     0    Normal activity, no symptoms [x]     1    Restricted in physical strenuous activity but ambulatory, able to do out light work []     2    Ambulatory and capable of self care, unable to do work activities, up and about               >50 % of waking hours                              []     3    Only limited self care, in bed greater than 50% of waking hours []     4    Completely disabled, no self care, confined to bed or chair []     5    Moribund   Past Medical History:   Diagnosis Date  . A-fib ([place])   . Arthritis   . BPH (benign prostatic hyperplasia)   . Chest pain, unspecified   . Chronic kidney disease   . Coronary atherosclerosis of unspecified type of vessel, native or graft   . Dizziness and giddiness   . Hyperlipidemia   . Hypertension   . Myocardial infarction ([place])    x2  . Non-ST elevation (NSTEMI) myocardial infarction ([place])   . Peripheral neuropathy   . PVD (peripheral vascular disease) ([place])   . Shortness of breath    exertion  . Vitamin B12 deficiency     Past Surgical History:  Procedure Laterality Date  . COLON SURGERY     partial colectomy Dr. Marcello Moores 12-14-17  . CORONARY ANGIOPLASTY     4 stents  . CORONARY STENT INTERVENTION N/A 08/09/2017   Procedure: CORONARY STENT INTERVENTION;  Surgeon: Nelva Bush, MD;  Location: [facility];  Service: Cardiovascular;  Laterality: N/A;  . FLEXIBLE SIGMOIDOSCOPY N/A 11/12/2017   Procedure: FLEXIBLE SIGMOIDOSCOPY;  Surgeon: Irene Shipper, MD;  Location:  [facility];  Service: Endoscopy;  Laterality: N/A;  . HERNIA REPAIR     right inguinal  . INGUINAL HERNIA REPAIR  06/14/2012   Procedure: HERNIA REPAIR INGUINAL ADULT;  Surgeon: Odis Hollingshead, MD;  Location: [facility];  Service: General;  Laterality: Right;  . INSERTION OF MESH  06/14/2012   Procedure: INSERTION OF MESH;  Surgeon: Odis Hollingshead, MD;  Location: [facility];  Service: General;  Laterality: Right;  . LAPAROSCOPIC PARTIAL COLECTOMY N/A 12/26/2017   Procedure: LAPAROSCOPIC PARTIAL COLECTOMY ERAS PATHWAY;  Surgeon: Leighton Ruff, MD;  Location: [facility];  Service: General;  Laterality: N/A;  . LEFT HEART CATH AND CORONARY ANGIOGRAPHY N/A 08/09/2017   Procedure: LEFT HEART CATH AND CORONARY ANGIOGRAPHY;  Surgeon: Nelva Bush, MD;  Location: [facility];  Service: Cardiovascular;  Laterality: N/A;  . PROCTOSCOPY N/A 12/26/2017   Procedure: PROCTOSCOPY;  Surgeon: Leighton Ruff, MD;  Location: [facility];  Service:  General;  Laterality: N/A;  . SINUS SURGERY WITH INSTATRAK  1985    Family History  Problem Relation Age of Onset  . Heart attack Father 51  . Arthritis Sister   . Hypothyroidism Daughter      Social History   Tobacco Use  Smoking Status Former Smoker  . Last attempt to quit: 07/10/1981  . Years since quitting: 37.0  Smokeless Tobacco Never Used    Social History   Substance and Sexual Activity  Alcohol Use No     Allergies  Allergen Reactions  . Gadolinium Derivatives Hives, Itching and Other (See Comments)    [name] s/w [name]. We observed him for 15 minutes. He did not need to take Benadryl. The symptoms began to subside before he left.     Current Facility-Administered Medications  Medication Dose Route Frequency Provider Last Rate Last Dose  . 0.45 % sodium chloride infusion   Intravenous Continuous Osei-Bonsu, Iona Beard, MD 50 mL/hr at 07/15/18 1502    . acetaminophen (TYLENOL) tablet 650 mg  650 mg Oral Q6H PRN Karmen Bongo, MD   650 mg at 07/15/18 0229   Or  . acetaminophen (TYLENOL) suppository 650 mg  650 mg Rectal Q6H PRN Karmen Bongo, MD      . atorvastatin (LIPITOR) tablet 40 mg  40 mg Oral Daily Karmen Bongo, MD   40 mg at 07/16/18 1025  . docusate sodium (COLACE) capsule 100 mg  100 mg Oral BID Karmen Bongo, MD   100 mg at 07/16/18 1024  . guaiFENesin (MUCINEX) 12 hr tablet 600 mg  600 mg Oral BID Roney Jaffe, MD   600 mg at 07/16/18 1025  . guaiFENesin-dextromethorphan (ROBITUSSIN DM) 100-10 MG/5ML syrup 5 mL  5 mL Oral Q4H PRN Debbe Odea, MD   5 mL at 07/12/18 1845  . ipratropium-albuterol (DUONEB) 0.5-2.5 (3) MG/3ML nebulizer solution 3 mL  3 mL Nebulization Q6H PRN Reyne Dumas, MD      . lisinopril (PRINIVIL,ZESTRIL) tablet 2.5 mg  2.5 mg Oral Daily Karmen Bongo, MD   2.5 mg at 07/16/18 1025  . metoprolol succinate (TOPROL-XL) 24 hr tablet 75 mg  75 mg Oral Daily Karmen Bongo, MD   75 mg at 07/16/18 1025  . morphine 2  MG/ML injection 2 mg  2 mg Intravenous Q2H PRN Karmen Bongo, MD   2 mg at 07/14/18 0747  . ondansetron (ZOFRAN) tablet 4 mg  4 mg Oral Q6H PRN Karmen Bongo, MD       Or  . ondansetron [facility])  injection 4 mg  4 mg Intravenous Q6H PRN Karmen Bongo, MD      . pantoprazole (PROTONIX) EC tablet 40 mg  40 mg Oral BID Meliton Rattan, MD   40 mg at 07/16/18 1024  . warfarin (COUMADIN) tablet 7.5 mg  7.5 mg Oral ONCE-1800 Tyrone Apple, RPH      . Warfarin - Pharmacist Dosing Inpatient   Does not apply q1800 Reginia Naas, Kelly at 07/14/18 1800    Pertinent items are noted in HPI.   Review of Systems:     Cardiac Review of Systems: [Y] = yes  or   [ N ] = no   Chest Pain [ n   ]  Resting SOB [ n   ] Exertional SOB  Blue.Reese  ]  Orthopnea [ n ]   Pedal Edema [ n  ]    Palpitations [ y ] Syncope  [n  ]   Presyncope [  n ]   General Review of Systems: [Y] = yes [  ]=no Constitional: recent weight change [  ];  Wt loss over the last 3 months [   ] anorexia [  ]; fatigue [  ]; nausea [  ]; night sweats [  ]; fever [  ]; or chills [  ];           Eye : blurred vision [  ]; diplopia [   ]; vision changes [  ];  Amaurosis fugax[  ]; Resp: cough [  ];  wheezing[  ];  hemoptysis[  ]; shortness of breath[  ]; paroxysmal nocturnal dyspnea[  ]; dyspnea on exertion[  ]; or orthopnea[  ];  GI:  gallstones[  ], vomiting[  ];  dysphagia[  ]; melena[  ];  hematochezia [  ]; heartburn[  ];   Hx of  Colonoscopy[  ]; GU: kidney stones [  ]; hematuria[  ];   dysuria [  ];  nocturia[  ];  history of     obstruction [  ]; urinary frequency [ y ]             Skin: rash, swelling[  ];, hair loss[  ];  peripheral edema[  ];  or itching[  ]; Musculosketetal: myalgias[  ];  joint swelling[  ];  joint erythema[  ];  joint pain[y  ];  back pain[  ];  Heme/Lymph: bruising[  ];  bleeding[  ];  anemia[  ];  Neuro: TIA[  ];  headaches[  ];  stroke[  ];  vertigo[  ];  seizures[  ];   paresthesias[  ];   difficulty walking[  ];  Psych:depression[  ]; anxiety[  ];  Endocrine: diabetes[  ];  thyroid dysfunction[  ];  Immunizations: Flu up to date [ n ]; Pneumococcal up to date Florencio.Farrier  ];  Other:     PHYSICAL EXAMINATION: BP 122/76 (BP Location: Left Arm)   Pulse 68   Temp 97.8 F (36.6 C) (Oral)   Resp 18   Ht 5\' 11"  (1.803 m)   Wt 75.1 kg   SpO2 97%   BMI 23.08 kg/m  General appearance: alert, cooperative, appears stated age and no distress Head: Normocephalic, without obvious abnormality, atraumatic Neck: no adenopathy, no carotid bruit, no JVD, supple, symmetrical, trachea midline and thyroid not enlarged, symmetric, no tenderness/mass/nodules Lymph nodes: Cervical, supraclavicular, and axillary nodes normal. Resp: clear to auscultation bilaterally Cardio: irregularly irregular rhythm GI: soft, non-tender;  bowel sounds normal; no masses,  no organomegaly Extremities: extremities normal, atraumatic, no cyanosis or edema and Homans sign is negative, no sign of DVT Neurologic: Grossly normal  Diagnostic Studies & Laboratory data:     Recent Radiology Findings:   Ct Chest Wo Contrast  Result Date: 06/03/2018 CLINICAL DATA:  History of colon cancer. Follow-up pulmonary nodule. EXAM: CT CHEST WITHOUT CONTRAST TECHNIQUE: Multidetector CT imaging of the chest was performed following the standard protocol without IV contrast. COMPARISON:  11/20/2017 FINDINGS: Cardiovascular: The heart size appears within normal limits. No pericardial effusion. Aortic atherosclerosis. Calcification within the left main, lad, left circumflex and RCA coronary arteries noted. Mediastinum/Nodes: Normal appearance of the thyroid gland. The trachea appears patent and is midline. Normal appearance of the esophagus. No axillary or supraclavicular adenopathy. No mediastinal or hilar adenopathy identified. Lungs/Pleura: Moderate changes of emphysema. No airspace consolidation, atelectasis or pneumothorax. Again seen is  a part solid nodule within the anterolateral left upper lobe. On today's exam this measures 2.1 by 1.4 cm and has a central solid component measuring 1.3 cm, image 71/7. Previously this measured 1.9 x 1.3 cm with a central solid component measuring 0.5 cm. Pure ground-glass attenuating nodule within the superior segment of left lower lobe measures 1.4 cm, image 37/7. Stable Upper Abdomen: No acute abnormality within the abdomen. Aortic atherosclerosis. Musculoskeletal: Spondylosis within the thoracic spine. No aggressive lytic or sclerotic bone lesions. IMPRESSION: 1. There is been interval increase in size of the part solid nodule within the left upper lobe. The central solid component now measures 1.3 cm. Adenocarcinoma is considered. Thoracic surgery consultation is recommended. PET/CT should be considered for staging purposes. These recommendations are taken from: Recommendations for the Management of Subsolid Pulmonary Nodules Detected at CT: A Statement from the [facility] 2013; 266:1, 304-317. 2. Stable pure ground-glass attenuating nodule within the superior segment of left lower lobe measuring 1.4 cm. Adenocarcinoma cannot be excluded. Follow up by CT is recommended in 12 months, with continued annual surveillance for a minimum of 3 years. These recommendations are taken from: Recommendations for the Management of Subsolid Pulmonary Nodules Detected at CT: A Statement from the [facility] 2013; 266:1, 304-317. 3. Aortic Atherosclerosis (ICD10-I70.0) and Emphysema (ICD10-J43.9). 4. Coronary artery atherosclerotic calcifications. Electronically Signed   By: Kerby Moors M.D.   On: 06/03/2018 16:53   Nm Pet Image Initial (pi) Skull Base To Thigh  Result Date: 06/26/2018 CLINICAL DATA:  Initial treatment strategy for sigmoid colon carcinoma. EXAM: NUCLEAR MEDICINE PET SKULL BASE TO THIGH TECHNIQUE: 9.6 mCi F-18 FDG was injected intravenously. Full-ring PET imaging was  performed from the skull base to thigh after the radiotracer. CT data was obtained and used for attenuation correction and anatomic localization. Fasting blood glucose: 99 mg/dl COMPARISON:  None. FINDINGS: Mediastinal blood pool activity: SUV max 1.81 NECK: No hypermetabolic lymph nodes in the neck. Incidental CT findings: none CHEST: Within the LEFT upper lobe solid and sub solid nodule is again noted measuring up to 20 mm including the sub solid portion. This central solid portion measures 11 mm and has associated metabolic activity (SUV max equal 4.5). No additional hypermetabolic pulmonary nodules. No hypermetabolic mediastinal lymph nodes. Incidental CT findings: Coronary artery calcification and aortic atherosclerotic calcification. ABDOMEN/PELVIS: There is intense circumferential metabolic activity involving the gastric fundus, gastric body and extending to the gastric antrum. Activity is intense with SUV max equal 14.2. There is no mass lesion identified on CT portion of exam. There is mild  activity through the distal esophagus from the level the carina to the GE junction. No abnormal activity in the liver. No hypermetabolic abdominopelvic lymph nodes. Symmetric activity in the adrenal glands noted. Prostate is enlarged. Anastomosis in the sigmoid colon noted. No evidence obstruction no metabolic at nodularity. No hypermetabolic pelvic lymph nodes. Incidental CT findings: none SKELETON: No focal hypermetabolic activity to suggest skeletal metastasis. Incidental CT findings: none IMPRESSION: 1. Hypermetabolic LEFT upper lobe pulmonary nodule. The solid portion of this nodule is increased over time. Findings concerning for BRONCHOGENIC CARCINOMA. 2. Intense radiotracer activity within the gastric fundus and body which is greater than normal physiologic activity. Differential would include gastritis versus lymphoma or primary gastric malignancy. As there is no mass lesion identified, malignancy is less  favored. Recommend upper endoscopy and potentially tissue sampling. 3. Diffuse activity associated the distal esophagus may relate to abnormal hypermetabolic stomach. 4. No evidence of local colorectal carcinoma at the sigmoid anastomosis. 5. No evidence of metastatic disease in the abdomen pelvis. Electronically Signed   By: Suzy Bouchard M.D.   On: 06/26/2018 17:10     I have independently reviewed the above radiology studies  and reviewed the findings with the patient.   Recent Lab Findings: Lab Results  Component Value Date   WBC 8.4 07/13/2018   HGB 12.9 (L) 07/13/2018   HCT 39.3 07/13/2018   PLT 235 07/13/2018   GLUCOSE 117 (H) 07/16/2018   CHOL 160 08/09/2017   TRIG 74 08/09/2017   HDL 41 08/09/2017   LDLCALC 104 (H) 08/09/2017   ALT 19 07/16/2018   AST 23 07/16/2018   NA 137 07/16/2018   K 3.9 07/16/2018   CL 103 07/16/2018   CREATININE 1.34 (H) 07/16/2018   BUN 27 (H) 07/16/2018   CO2 26 07/16/2018   TSH 0.926 08/08/2017   INR 1.20 07/16/2018   HGBA1C 6.0 02/14/2011   Chronic Kidney Disease   Stage I     GFR >90  Stage II    GFR 60-89  Stage IIIA GFR 45-59  Stage IIIB GFR 30-44  Stage IV   GFR 15-29  Stage V    GFR  <15  Lab Results  Component Value Date   CREATININE 1.34 (H) 07/16/2018   Estimated Creatinine Clearance: 42 mL/min (A) (by C-G formula based on SCr of 1.34 mg/dL (H)).   Assessment / Plan:   1. Sigmoid colon cancer, stage II (T3N0), status post a sigmoidectomy 12/26/2017 Coronary stents February 2019 placed Anticoagulated on Coumadin and Plavix 2. Coronary artery disease 3. Atrial fibrillation 4. BPH 5.   Hypermetabolic LEFT upper lobe pulmonary nodule. Now proven to be adeno ca .The solid portion of this nodule is increased over time. I have recommended to the patient that we consider  stereotactic radiation therapy rather then surgical resection, due to his commodities . Genetic markers are to be sent. 6. ptx resolved    David Isaac MD      [address] [place] [address] Office [phone]   [place]  07/16/2018 4:05 PM

## 2018-07-16 NOTE — Progress Notes (Signed)
Oncology Nurse Navigator Documentation  Oncology Nurse Navigator Flowsheets 07/16/2018  Navigator Location [facility]  Navigator Encounter Type Other/Per Dr. Benay Spice, I completed referral to rad onc and requested foundation one and PDL 1 on Mr. Venezia.  I called and spoke with daughter with an update. She was thankful for the call.   Treatment Phase Pre-Tx/Tx Discussion  Barriers/Navigation Needs Education;Coordination of Care  Education Other  Interventions Coordination of Care;Education  Coordination of Care Other  Education Method Verbal  Acuity Level 2  Time Spent with Patient 1

## 2018-07-16 NOTE — Evaluation (Signed)
Physical Therapy Evaluation Patient Details Name: [name] MRN: [id number] DOB: 1929/10/21 Today's Date: 07/16/2018   History of Present Illness  [name] is a 85 y.o. male with medical history significant of PVD; CAD; HTN; HLD; CKD; and afib presenting for lung biopsy, who suffered a pneumothorax.  Clinical Impression  Pt admitted with above diagnosis. Pt currently with functional limitations due to the deficits listed below (see PT Problem List). Pt presents with some balance deficits which were likely preexisting, also repeats self frequently throughout session. Did demonstrate good carryover of safety cues from OT session to PT session though. Would benefit from balance training at home at d/c.  Pt will benefit from skilled PT to increase their independence and safety with mobility to allow discharge to the venue listed below.       Follow Up Recommendations Home health PT;Supervision - Intermittent    Equipment Recommendations  Rolling walker with 5" wheels    Recommendations for Other Services       Precautions / Restrictions Precautions Precautions: None Precaution Comments: L chest tube Restrictions Weight Bearing Restrictions: No      Mobility  Bed Mobility Overal bed mobility: Needs Assistance Bed Mobility: Sit to Supine;Supine to Sit     Supine to sit: Supervision Sit to supine: Supervision   General bed mobility comments: practiced getting in and back out of bed with rolling. Pt able to do so with supervision and vc's, he has difficulty with L shoulder due to bone spurs  Transfers Overall transfer level: Needs assistance Equipment used: Rolling walker (2 wheeled) Transfers: Sit to/from Stand Sit to Stand: Min guard         General transfer comment: pt able to verbalize safety cues from session with OT ealier in day  Ambulation/Gait Ambulation/Gait assistance: Min guard Gait Distance (Feet): 250 Feet Assistive device: Rolling walker (2  wheeled) Gait Pattern/deviations: Drifts right/left Gait velocity: decreased Gait velocity interpretation: 1.31 - 2.62 ft/sec, indicative of limited community ambulator General Gait Details: drifts R and L when distracted by conversation. Steady with RW but unsteady when he let go of it  Financial trader Rankin (Stroke Patients Only)       Balance Overall balance assessment: Needs assistance Sitting-balance support: No upper extremity supported Sitting balance-Leahy Scale: Good     Standing balance support: No upper extremity supported Standing balance-Leahy Scale: Fair Standing balance comment: limitation evident when pt does not have UE support                             Pertinent Vitals/Pain Pain Assessment: No/denies pain    Home Living Family/patient expects to be discharged to:: Private residence Living Arrangements: Alone Available Help at Discharge: Family;Available PRN/intermittently Type of Home: House Home Access: Stairs to enter Entrance Stairs-Rails: Left Entrance Stairs-Number of Steps: 3 Home Layout: One level Home Equipment: Cane - single point Additional Comments: pt used no AD around house and took [facility] in car with him for community ambulation    Prior Function Level of Independence: Independent with assistive device(s)         Comments: daughter lives next door, he can stay at her house first few nights     Hand Dominance   Dominant Hand: Right    Extremity/Trunk Assessment   Upper Extremity Assessment Upper Extremity Assessment: LUE deficits/detail LUE Deficits /  Details: pt reports he has bone spurs L shoulder with pain with certain mvmts, affected bed mobility mildly    Lower Extremity Assessment Lower Extremity Assessment: Generalized weakness    Cervical / Trunk Assessment Cervical / Trunk Assessment: Kyphotic  Communication   Communication: No difficulties  Cognition  Arousal/Alertness: Awake/alert Behavior During Therapy: WFL for tasks assessed/performed Overall Cognitive Status: No family/caregiver present to determine baseline cognitive functioning                                 General Comments: repeats self frequently      General Comments General comments (skin integrity, edema, etc.): VSS on RA    Exercises General Exercises - Lower Extremity Ankle Circles/Pumps: AROM;Both;20 reps;Seated   Assessment/Plan    PT Assessment Patient needs continued PT services  PT Problem List Decreased strength;Decreased activity tolerance;Decreased balance;Decreased mobility;Decreased knowledge of use of DME;Decreased knowledge of precautions       PT Treatment Interventions DME instruction;Gait training;Stair training;Functional mobility training;Therapeutic activities;Therapeutic exercise;Balance training;Patient/family education    PT Goals (Current goals can be found in the Care Plan section)  Acute Rehab PT Goals Patient Stated Goal: To go home. I have the wildlife dinner to plan PT Goal Formulation: With patient Time For Goal Achievement: 07/30/18 Potential to Achieve Goals: Good    Frequency Min 3X/week   Barriers to discharge Decreased caregiver support can stay with daughter short term but she has childrena dn will be busy, will likely be back at his home alone quickly    Co-evaluation               AM-PAC PT "6 Clicks" Mobility  Outcome Measure Help needed turning from your back to your side while in a flat bed without using bedrails?: A Little Help needed moving from lying on your back to sitting on the side of a flat bed without using bedrails?: A Little Help needed moving to and from a bed to a chair (including a wheelchair)?: A Little Help needed standing up from a chair using your arms (e.g., wheelchair or bedside chair)?: A Little Help needed to walk in hospital room?: A Little Help needed climbing 3-5 steps  with a railing? : A Little 6 Click Score: 18    End of Session   Activity Tolerance: Patient tolerated treatment well Patient left: in chair;with call bell/phone within reach;with chair alarm set Nurse Communication: Mobility status PT Visit Diagnosis: Unsteadiness on feet (R26.81);Muscle weakness (generalized) (M62.81);Difficulty in walking, not elsewhere classified (R26.2)    Time: 0623-7628 PT Time Calculation (min) (ACUTE ONLY): 35 min   Charges:   PT Evaluation $PT Eval Moderate Complexity: 1 Mod PT Treatments $Gait Training: 8-22 mins        Leighton Roach, PT  Acute Rehab Services  Pager [phone] Office [place] 07/16/2018, 2:33 PM

## 2018-07-16 NOTE — Discharge Summary (Signed)
Physician Discharge Summary  [name] MRN: [id number] DOB/AGE: 1929-08-16 85 y.o.  PCP: Marton Redwood, MD   Admit date: 07/09/2018 Discharge date: 07/16/2018  Discharge Diagnoses:   Principal Problem:   Pneumothorax of left lung after biopsy Active Problems:   Atrial fibrillation ([place])   Hypertension   Long term (current) use of anticoagulants [Z79.01]   Cancer of sigmoid colon s/p sigmoid colectomy 12/26/2017   Lung nodule   Acute respiratory failure with hypoxia (HCC)   Chest tube in place   Left upper lobe pulmonary nodule    Follow-up recommendations Follow-up with PCP in 3-5 days , including all  additional recommended appointments as below Follow-up CBC, CMP in 3-5 days Patient not started on Lovenox bridge because of recent procedures, recommended to continue INR and slowly allowed to drift up Patient recommended to follow-up with PCP for INR on 1/9 and 1/13, and to subsequently adjust his Coumadin dosing  he is being discharged with home health      Allergies as of 07/16/2018      Reactions   Gadolinium Derivatives Hives, Itching, Other (See Comments)   Dr. Jeralyn Ruths s/w [name]. We observed him for 15 minutes. He did not need to take Benadryl. The symptoms began to subside before he left.       Medication List    TAKE these medications   atorvastatin 40 MG tablet Commonly known as:  LIPITOR TAKE ONE TABLET DAILY AT 6PM What changed:  See the new instructions.   clopidogrel 75 MG tablet Commonly known as:  PLAVIX TAKE ONE TABLET EACH DAY What changed:    how much to take  how to take this  when to take this  additional instructions   guaiFENesin 600 MG 12 hr tablet Commonly known as:  MUCINEX Take 1 tablet (600 mg total) by mouth 2 (two) times daily.   lisinopril 5 MG tablet Commonly known as:  PRINIVIL,ZESTRIL Take 0.5 tablets (2.5 mg total) by mouth daily. Start taking on:  July 23, 2018 What changed:  These instructions start on  July 23, 2018. If you are unsure what to do until then, ask your doctor or other care provider.   metoprolol succinate 25 MG 24 hr tablet Commonly known as:  TOPROL-XL Take 3 tablets (75 mg total) by mouth daily.   pantoprazole 40 MG tablet Commonly known as:  PROTONIX Take 1 tablet (40 mg total) by mouth daily.   vitamin B-12 1000 MCG tablet Commonly known as:  CYANOCOBALAMIN Take 1,000 mcg by mouth daily.   warfarin 5 MG tablet Commonly known as:  COUMADIN Take as directed. If you are unsure how to take this medication, talk to your nurse or doctor. Original instructions:  TAKE AS DIRECTED BY COUMADIN CLINIC What changed:  See the new instructions.        Discharge Condition: stable *  Discharge Instructions Get Medicines reviewed and adjusted: Please take all your medications with you for your next visit with your Primary MD  Please request your Primary MD to go over all hospital tests and procedure/radiological results at the follow up, please ask your Primary MD to get all Hospital records sent to his/her office.  If you experience worsening of your admission symptoms, develop shortness of breath, life threatening emergency, suicidal or homicidal thoughts you must seek medical attention immediately by calling 911 or calling your MD immediately  if symptoms less severe.  You must read complete instructions/literature along with all the possible adverse reactions/side  effects for all the Medicines you take and that have been prescribed to you. Take any new Medicines after you have completely understood and accpet all the possible adverse reactions/side effects.   Do not drive when taking Pain medications.   Do not take more than prescribed Pain, Sleep and Anxiety Medications  Special Instructions: If you have smoked or chewed Tobacco  in the last 2 yrs please stop smoking, stop any regular Alcohol  and or any Recreational drug use.  Wear Seat belts while  driving.  Please note  You were cared for by a hospitalist during your hospital stay. Once you are discharged, your primary care physician will handle any further medical issues. Please note that NO REFILLS for any discharge medications will be authorized once you are discharged, as it is imperative that you return to your primary care physician (or establish a relationship with a primary care physician if you do not have one) for your aftercare needs so that they can reassess your need for medications and monitor your lab values.     Allergies  Allergen Reactions  . Gadolinium Derivatives Hives, Itching and Other (See Comments)    Dr. Jeralyn Ruths s/w [name]. We observed him for 15 minutes. He did not need to take Benadryl. The symptoms began to subside before he left.       Disposition: Discharge disposition: 01-Home or Self Care        Consults IR cardiology    Significant Diagnostic Studies:  Dg Chest 2 View  Result Date: 07/14/2018 CLINICAL DATA:  Pneumothorax. EXAM: CHEST - 2 VIEW COMPARISON:  07/13/2018, 07/10/2018 and CT 07/13/2018 FINDINGS: Interval placement of a left pigtail pleural catheter with tip over the lateral mid thorax. No surgical left-sided pneumothorax visualized. Lungs are hypoinflated with mild stable opacification over the medial lung bases likely small amount of layering pleural fluid and atelectasis. Small nodule opacity over the lateral left midlung not well seen on the recent prior exams. Minimal prominence of the central pulmonary vessels unchanged. Mild stable cardiomegaly. Moderate subcutaneous emphysema over the thorax unchanged. Remainder of the exam is unchanged. IMPRESSION: Stable medial bibasilar opacification likely layering effusions with atelectasis. Left pleural catheter in place. No residual left-sided pneumothorax visualized. Small nodular density over the lateral left midlung not well seen on recent prior exams. Recommend attention on  follow-up. Electronically Signed   By: Marin Olp M.D.   On: 07/14/2018 07:16   Dg Chest 2 View  Result Date: 07/13/2018 CLINICAL DATA:  Left pneumothorax after recent lung biopsy. EXAM: CHEST - 2 VIEW COMPARISON:  07/12/2018 FINDINGS: The heart size and mediastinal contours are within normal limits. There is slight enlargement of left apical and lateral pneumothorax estimated to be roughly 10-15% in volume. There also is increase in subcutaneous emphysema across the chest wall and extending into the neck. Bibasilar atelectasis present. No pulmonary edema or pleural fluid identified. The visualized skeletal structures are unremarkable. IMPRESSION: Slight enlargement of left apical and lateral pneumothorax estimated to be 10-15% in volume. There also is increase in subcutaneous emphysema in the chest wall and neck. Electronically Signed   By: Aletta Edouard M.D.   On: 07/13/2018 09:24   Nm Pet Image Initial (pi) Skull Base To Thigh  Result Date: 06/26/2018 CLINICAL DATA:  Initial treatment strategy for sigmoid colon carcinoma. EXAM: NUCLEAR MEDICINE PET SKULL BASE TO THIGH TECHNIQUE: 9.6 mCi F-18 FDG was injected intravenously. Full-ring PET imaging was performed from the skull base to thigh after the  radiotracer. CT data was obtained and used for attenuation correction and anatomic localization. Fasting blood glucose: 99 mg/dl COMPARISON:  None. FINDINGS: Mediastinal blood pool activity: SUV max 1.81 NECK: No hypermetabolic lymph nodes in the neck. Incidental CT findings: none CHEST: Within the LEFT upper lobe solid and sub solid nodule is again noted measuring up to 20 mm including the sub solid portion. This central solid portion measures 11 mm and has associated metabolic activity (SUV max equal 4.5). No additional hypermetabolic pulmonary nodules. No hypermetabolic mediastinal lymph nodes. Incidental CT findings: Coronary artery calcification and aortic atherosclerotic calcification. ABDOMEN/PELVIS:  There is intense circumferential metabolic activity involving the gastric fundus, gastric body and extending to the gastric antrum. Activity is intense with SUV max equal 14.2. There is no mass lesion identified on CT portion of exam. There is mild activity through the distal esophagus from the level the carina to the GE junction. No abnormal activity in the liver. No hypermetabolic abdominopelvic lymph nodes. Symmetric activity in the adrenal glands noted. Prostate is enlarged. Anastomosis in the sigmoid colon noted. No evidence obstruction no metabolic at nodularity. No hypermetabolic pelvic lymph nodes. Incidental CT findings: none SKELETON: No focal hypermetabolic activity to suggest skeletal metastasis. Incidental CT findings: none IMPRESSION: 1. Hypermetabolic LEFT upper lobe pulmonary nodule. The solid portion of this nodule is increased over time. Findings concerning for BRONCHOGENIC CARCINOMA. 2. Intense radiotracer activity within the gastric fundus and body which is greater than normal physiologic activity. Differential would include gastritis versus lymphoma or primary gastric malignancy. As there is no mass lesion identified, malignancy is less favored. Recommend upper endoscopy and potentially tissue sampling. 3. Diffuse activity associated the distal esophagus may relate to abnormal hypermetabolic stomach. 4. No evidence of local colorectal carcinoma at the sigmoid anastomosis. 5. No evidence of metastatic disease in the abdomen pelvis. Electronically Signed   By: Suzy Bouchard M.D.   On: 06/26/2018 17:10   Ct Biopsy  Result Date: 07/09/2018 INDICATION: 85 year old with a suspicious lesion in the left upper lobe. History of colon cancer. Plan for CT-guided left upper lobe nodule biopsy. EXAM: CT-GUIDED BIOPSY OF LEFT UPPER LOBE PULMONARY NODULE CT-GUIDED PLACEMENT OF LEFT CHEST TUBE MEDICATIONS: None. ANESTHESIA/SEDATION: Moderate (conscious) sedation was employed during this procedure. A  total of Versed 1.5 mg and Fentanyl 50 mcg was administered intravenously. Moderate Sedation Time: 32 minutes. The patient's level of consciousness and vital signs were monitored continuously by radiology nursing throughout the procedure under my direct supervision. FLUOROSCOPY TIME:  None COMPLICATIONS: Pneumothorax that required chest tube placement. PROCEDURE: Informed written consent was obtained from the patient after a thorough discussion of the procedural risks, benefits and alternatives. All questions were addressed. A timeout was performed prior to the initiation of the procedure. Patient was placed supine. CT images through the chest were obtained. Small lesion in the periphery of the left upper lobe was identified and targeted. The anterior chest was prepped with chlorhexidine and sterile field was created. Maximal barrier sterile technique was utilized including caps, mask, sterile gowns, sterile gloves, sterile drape, hand hygiene and skin antiseptic. Skin was anesthetized with 1% lidocaine. A 17 gauge coaxial needle was directed into the left lung with CT guidance and there was an immediate left pneumothorax. Left pneumothorax continued to enlarge over a few minutes although the patient was asymptomatic. Decision was made to place a chest tube. Amplatz wire was advanced through the existing needle and the tract was dilated to accommodate a 10.2 [place] multipurpose drain. Drain was  attached to a PleurEvac and wall suction. After placement to suction, the pneumothorax resolved. A 17 gauge needle was then directed slightly lateral to the existing chest tube and into the lesion with CT guidance. Two core biopsies were obtained with an 18 gauge core device. Specimens placed in formalin. Needle was removed without complication. The chest tube was sutured to the skin. FINDINGS: Small irregular opacity in the periphery of the left upper lobe. Patient immediately developed a pneumothorax following placement of  the needle into the left lung. The pneumothorax was enlarging over a few minutes although the patient remained asymptomatic. Chest tube was required in order to perform the biopsy. Chest tube successfully placed along the anterior left lung. Biopsy needle was confirmed within the lesion. Two core biopsies were obtained. At the end of the procedure, there was minimal parenchymal hemorrhage around the lesion. Pneumothorax had essentially resolved with the left anterior chest tube. IMPRESSION: CT-guided biopsy of the left upper lobe lung lesion. Procedure was complicated by a pneumothorax. Pneumothorax was successfully treated with a CT-guided chest tube placement. Patient will be admitted for chest tube management. Electronically Signed   By: Markus Daft M.D.   On: 07/09/2018 16:35   Dg Chest Port 1 View  Result Date: 07/16/2018 CLINICAL DATA:  Pneumothorax after left lung biopsy.  Chest tube. EXAM: PORTABLE CHEST 1 VIEW COMPARISON:  07/14/2018 FINDINGS: Pigtail chest tube on the left is unchanged in position. No residual pneumothorax or effusion on the left. Bibasilar atelectasis/infiltrate unchanged. Negative for edema. Small nodule left mid lung adjacent to the chest tube is less apparent on today's study. Subcutaneous gas is present in the chest bilaterally right greater than left. No pneumothorax on the right. IMPRESSION: Left chest tube remains in place.  No pneumothorax Bibasilar atelectasis/infiltrate unchanged. Electronically Signed   By: Franchot Gallo M.D.   On: 07/16/2018 06:53   Dg Chest Port 1 View  Result Date: 07/12/2018 CLINICAL DATA:  Left pneumothorax. EXAM: PORTABLE CHEST 1 VIEW COMPARISON:  Radiograph of July 11, 2018. FINDINGS: Stable cardiomediastinal silhouette. Atherosclerosis of thoracic aorta is noted. Right lung is clear. Stable mild left apical pneumothorax is noted with minimal left basilar component. Mild left basilar subsegmental atelectasis is noted. Mild amount of subcutaneous  emphysema seen over left lateral chest wall left supraclavicular region. Bony thorax is unremarkable. IMPRESSION: Mild left apical pneumothorax with minimal left basilar component. Subcutaneous emphysema is seen over left lateral chest wall supraclavicular region. Aortic Atherosclerosis (ICD10-I70.0). Electronically Signed   By: Marijo Conception, M.D.   On: 07/12/2018 08:19   Dg Chest Port 1 View  Result Date: 07/11/2018 CLINICAL DATA:  Follow-up left pneumothorax. EXAM: PORTABLE CHEST 1 VIEW COMPARISON:  Chest x-ray from yesterday. FINDINGS: Interval removal of the left pigtail chest tube with recurrent small left pneumothorax. The heart size and mediastinal contours are within normal limits. Normal pulmonary vascularity. No consolidation or pleural effusion. No acute osseous abnormality. Subcutaneous emphysema in the left chest wall. IMPRESSION: 1. Recurrent small left pneumothorax. These results will be called to the ordering clinician or representative by the Radiologist Assistant, and communication documented in the PACS or zVision Dashboard. Electronically Signed   By: Titus Dubin M.D.   On: 07/11/2018 17:00   Dg Chest Port 1 View  Result Date: 07/10/2018 CLINICAL DATA:  Pneumothorax after biopsy EXAM: PORTABLE CHEST - 1 VIEW COMPARISON:  the previous day's study FINDINGS: The left pigtail chest tube has retracted somewhat, sideholes still projecting within the left hemithorax.  No convincing residual apical pneumothorax visualized on this portable exam. Some mild subsegmental atelectasis in the lung bases. Heart size and mediastinal contours are within normal limits. Aortic Atherosclerosis (ICD10-170.0). Coronary stents. No effusion. Visualized bones unremarkable. IMPRESSION: 1. Partial retraction of chest tube.  No definite pneumothorax. Electronically Signed   By: Lucrezia Europe M.D.   On: 07/10/2018 09:03   Dg Chest Port 1 View  Result Date: 07/09/2018 CLINICAL DATA:  Left chest tube. EXAM:  PORTABLE CHEST 1 VIEW COMPARISON:  08/08/2017.  Chest CT for biopsy earlier today. FINDINGS: Left chest tube is in place. Tiny left apical pneumothorax. Heart is normal size. Coronary artery stents noted. No confluent airspace opacities or effusions. IMPRESSION: Left chest tube in place with tiny left apical pneumothorax. Electronically Signed   By: Rolm Baptise M.D.   On: 07/09/2018 16:57   Ct Perc Pleural Drain W/indwell Cath W/img Guide  Result Date: 07/14/2018 INDICATION: Recurrent and enlarging left pneumothorax following percutaneous biopsy of left upper lobe lung nodule on 07/09/2018. A pigtail chest tube was placed at that time and inadvertently removed on 07/11/2018. Left-sided pneumothorax has been increasing since that time with increased oxygen requirement. The patient now presents for new left chest tube placement. EXAM: CT-GUIDED INSERTION OF PERCUTANEOUS PLEURAL DRAINAGE CATHETER MEDICATIONS: No additional medications other than sedative medicines listed below. ANESTHESIA/SEDATION: Fentanyl 50 mcg IV; Versed 1.0 mg IV Moderate Sedation Time:  15 minutes. The patient was continuously monitored during the procedure by the interventional radiology nurse under my direct supervision. COMPLICATIONS: None immediate. PROCEDURE: Informed written consent was obtained from the patient after a thorough discussion of the procedural risks, benefits and alternatives. All questions were addressed. Maximal Sterile Barrier Technique was utilized including caps, mask, sterile gowns, sterile gloves, sterile drape, hand hygiene and skin antiseptic. A timeout was performed prior to the initiation of the procedure. CT was performed in a supine position through the mid to lower chest. After choosing a site for percutaneous entry along the lower anterior left chest wall, an 18 gauge trocar needle was advanced into the anterior pleural space. After confirming needle tip position, a guidewire was advanced and the needle  removed. The tract was dilated and a 12 French pigtail catheter advanced into the pleural space. Catheter position was confirmed by CT after placement. The catheter was secured at the skin with a Prolene retention suture, StatLock device and Vaseline gauze dressing. The catheter was connected to a [place] Pleur-evac device and attached to wall suction at -20 cm of water. FINDINGS: Since chest x-ray earlier this morning at 0900 hours, there is definite significant increase in size of the left pneumothorax now estimated to be 40-50% in volume. Bilateral lower lobe atelectasis present as well as small bilateral pleural effusions. Subcutaneous air is present in the anterior chest wall. The 12 French catheter was advanced into the lateral left pleural space and angled to ascend superiorly. IMPRESSION: Placement of 12 French left-sided pleural thoracostomy tube to treat recurrent and enlarging left pneumothorax. The catheter was attached to a Pleur-evac device and attached to wall suction at -20 cm of water. Electronically Signed   By: Aletta Edouard M.D.   On: 07/14/2018 08:17   Ct Perc Pleural Drain W/indwell Cath W/img Guide  Result Date: 07/09/2018 INDICATION: 85 year old with a suspicious lesion in the left upper lobe. History of colon cancer. Plan for CT-guided left upper lobe nodule biopsy. EXAM: CT-GUIDED BIOPSY OF LEFT UPPER LOBE PULMONARY NODULE CT-GUIDED PLACEMENT OF LEFT CHEST TUBE MEDICATIONS:  None. ANESTHESIA/SEDATION: Moderate (conscious) sedation was employed during this procedure. A total of Versed 1.5 mg and Fentanyl 50 mcg was administered intravenously. Moderate Sedation Time: 32 minutes. The patient's level of consciousness and vital signs were monitored continuously by radiology nursing throughout the procedure under my direct supervision. FLUOROSCOPY TIME:  None COMPLICATIONS: Pneumothorax that required chest tube placement. PROCEDURE: Informed written consent was obtained from the patient  after a thorough discussion of the procedural risks, benefits and alternatives. All questions were addressed. A timeout was performed prior to the initiation of the procedure. Patient was placed supine. CT images through the chest were obtained. Small lesion in the periphery of the left upper lobe was identified and targeted. The anterior chest was prepped with chlorhexidine and sterile field was created. Maximal barrier sterile technique was utilized including caps, mask, sterile gowns, sterile gloves, sterile drape, hand hygiene and skin antiseptic. Skin was anesthetized with 1% lidocaine. A 17 gauge coaxial needle was directed into the left lung with CT guidance and there was an immediate left pneumothorax. Left pneumothorax continued to enlarge over a few minutes although the patient was asymptomatic. Decision was made to place a chest tube. Amplatz wire was advanced through the existing needle and the tract was dilated to accommodate a 10.2 [place] multipurpose drain. Drain was attached to a PleurEvac and wall suction. After placement to suction, the pneumothorax resolved. A 17 gauge needle was then directed slightly lateral to the existing chest tube and into the lesion with CT guidance. Two core biopsies were obtained with an 18 gauge core device. Specimens placed in formalin. Needle was removed without complication. The chest tube was sutured to the skin. FINDINGS: Small irregular opacity in the periphery of the left upper lobe. Patient immediately developed a pneumothorax following placement of the needle into the left lung. The pneumothorax was enlarging over a few minutes although the patient remained asymptomatic. Chest tube was required in order to perform the biopsy. Chest tube successfully placed along the anterior left lung. Biopsy needle was confirmed within the lesion. Two core biopsies were obtained. At the end of the procedure, there was minimal parenchymal hemorrhage around the lesion.  Pneumothorax had essentially resolved with the left anterior chest tube. IMPRESSION: CT-guided biopsy of the left upper lobe lung lesion. Procedure was complicated by a pneumothorax. Pneumothorax was successfully treated with a CT-guided chest tube placement. Patient will be admitted for chest tube management. Electronically Signed   By: Markus Daft M.D.   On: 07/09/2018 16:35    echocardiogram       Filed Weights   07/13/18 0440 07/14/18 0500 07/16/18 0500  Weight: 78.8 kg 78.8 kg 75.1 kg     Microbiology: No results found for this or any previous visit (from the past 240 hour(s)).     Blood Culture No results found for: [place], [place], [place], REPTSTATUS    Labs: Results for orders placed or performed during the hospital encounter of 07/09/18 (from the past 48 hour(s))  Basic metabolic panel     Status: Abnormal   Collection Time: 07/14/18  4:28 PM  Result Value Ref Range   Sodium 134 (L) 135 - 145 mmol/L   Potassium 4.2 3.5 - 5.1 mmol/L   Chloride 100 98 - 111 mmol/L   CO2 24 22 - 32 mmol/L   Glucose, Bld 165 (H) 70 - 99 mg/dL   BUN 38 (H) 8 - 23 mg/dL   Creatinine, Ser 1.70 (H) 0.61 - 1.24 mg/dL   Calcium  8.7 (L) 8.9 - 10.3 mg/dL   GFR calc non Af Amer 36 (L) >60 mL/min   GFR calc Af Amer 41 (L) >60 mL/min   Anion gap 10 5 - 15    Comment: Performed at [facility] [address] [place], [place] [address]  Troponin I - ONCE - STAT     Status: None   Collection Time: 07/14/18  4:28 PM  Result Value Ref Range   Troponin I <0.03 <0.03 ng/mL    Comment: Performed at [facility], [place] [address], [place], [place] [address]  Phosphorus     Status: None   Collection Time: 07/14/18  4:28 PM  Result Value Ref Range   Phosphorus 3.8 2.5 - 4.6 mg/dL    Comment: Performed at [facility] [address], [place], [place] [address]  Magnesium     Status: None   Collection Time: 07/14/18  4:28 PM  Result Value Ref Range   Magnesium 1.8 1.7 - 2.4 mg/dL     Comment: Performed at [facility], [place] [address] [place], [place] [address]  Protime-INR     Status: Abnormal   Collection Time: 07/15/18  1:50 AM  Result Value Ref Range   Prothrombin Time 16.4 (H) 11.4 - 15.2 seconds   INR 1.33     Comment: Performed at [facility] [address] [place], [place] [address]  Comprehensive metabolic panel     Status: Abnormal   Collection Time: 07/15/18  1:50 AM  Result Value Ref Range   Sodium 135 135 - 145 mmol/L   Potassium 4.3 3.5 - 5.1 mmol/L   Chloride 103 98 - 111 mmol/L   CO2 24 22 - 32 mmol/L   Glucose, Bld 138 (H) 70 - 99 mg/dL   BUN 35 (H) 8 - 23 mg/dL   Creatinine, Ser 1.47 (H) 0.61 - 1.24 mg/dL   Calcium 8.4 (L) 8.9 - 10.3 mg/dL   Total Protein 6.0 (L) 6.5 - 8.1 g/dL   Albumin 2.2 (L) 3.5 - 5.0 g/dL   AST 19 15 - 41 U/L   ALT 16 0 - 44 U/L   Alkaline Phosphatase 63 38 - 126 U/L   Total Bilirubin 1.1 0.3 - 1.2 mg/dL   GFR calc non Af Amer 43 (L) >60 mL/min   GFR calc Af Amer 49 (L) >60 mL/min   Anion gap 8 5 - 15    Comment: Performed at [facility], [place] [address], [place], [place] [address]  Protime-INR     Status: None   Collection Time: 07/16/18  4:10 AM  Result Value Ref Range   Prothrombin Time 15.1 11.4 - 15.2 seconds   INR 1.20     Comment: Performed at [facility] [address], [place],  [address]  Comprehensive metabolic panel     Status: Abnormal   Collection Time: 07/16/18  4:10 AM  Result Value Ref Range   Sodium 137 135 - 145 mmol/L   Potassium 3.9 3.5 - 5.1 mmol/L   Chloride 103 98 - 111 mmol/L   CO2 26 22 - 32 mmol/L   Glucose, Bld 117 (H) 70 - 99 mg/dL   BUN 27 (H) 8 - 23 mg/dL   Creatinine, Ser 1.34 (H) 0.61 - 1.24 mg/dL   Calcium 8.4 (L) 8.9 - 10.3 mg/dL   Total Protein 6.0 (L) 6.5 - 8.1 g/dL   Albumin 2.3 (L)  3.5 - 5.0 g/dL   AST 23 15 - 41 U/L   ALT 19 0 - 44 U/L   Alkaline Phosphatase 60 38 - 126 U/L   Total Bilirubin 0.9 0.3 - 1.2 mg/dL   GFR calc non  Af Amer 48 (L) >60 mL/min   GFR calc Af Amer 55 (L) >60 mL/min   Anion gap 8 5 - 15    Comment: Performed at [facility] [address] [place], [place] [address]     Lipid Panel     Component Value Date/Time   CHOL 160 08/09/2017 0359   TRIG 74 08/09/2017 0359   HDL 41 08/09/2017 0359   CHOLHDL 3.9 08/09/2017 0359   VLDL 15 08/09/2017 0359   LDLCALC 104 (H) 08/09/2017 0359     Lab Results  Component Value Date   HGBA1C 6.0 02/14/2011   HGBA1C 5.9 11/09/2009   HGBA1C 6.0 12/14/2008     Lab Results  Component Value Date   LDLCALC 104 (H) 08/09/2017   CREATININE 1.34 (H) 07/16/2018     HPI :  [name]  is a 85 y.o.malewith medical history significant ofPVD; CAD; HTN; HLD; CKD; and afib. He came to have a lung biopsy by IR done on 07/09/18, and subsequently developed a pneumothorax. A chest tube was placed by IR same day, and [facility] were asked to admit.    HOSPITAL COURSE:   Pneumothorax of left lung after biopsy - sp biopsy of LUL nodule done 07/09/18 followed by development of pneumothorax - required placement of L chest tube (pigtail) for procedure related PTX 07/09/18 - admitted to [facility] team after CT placememt  - accidentally pt pulled out his chest tube on 07/11/18 in the evening - IR put in new Left chest thoracostomy tube placement under CT guidance 07/13/18  On 07/14/18 chest x-ray showed complete reexpansion of the left lung and no pneumothorax. No air leak on exam.recommend leaving pigtail to wall suction at least another 24-48 hours given recurrence of PTX requiring 2 chest tubes IR Removed pleural drainage catheter on the left side  1/7, follow-up chest x-ray found to be stable   AKI Pt seems dry Gentle hydration with f/u of renal function-improving, down from 1.81>1.47 >1.34 renal ultrasound could be considered if a AKI persist   Paroxysmal  Atrial fibrillation CHADsVASC2 = 4 Patient's heart rate was controlled on the day  of discharge, continue metoprolol at the current dose Blood pressure borderline low, will hold lisinopril until next week and then resume Patient is scheduled for a cardiology follow-up Coumadin  Being dosed by pharmacy, no Lovenox bridge due to recent procedures Patient will need follow-up INR on 1/9 and 1/13  CAD without angina severe 2V CAD hx recurrent stents in LAD and LCx.  At home takes plavix, statin and beta blocker.   Resume Plavix  HTN BP is controlled. Hold lisinopril for 1 more week, then resume  Ischemic cardiomyopathy EF 40% in 07/2017 improved to 60% when checked in Sept2019, prob due to revasc / PCI procedures done in January . Continue the beta blocker.   Lung nodule PET positive f/u on biopsy results from 07/09/18      Discharge Exam:  * Blood pressure 122/76, pulse 68, temperature 97.8 F (36.6 C), temperature source Oral, resp. rate 18, height 5\' 11"  (1.803 m), weight 75.1 kg, SpO2 97 %.  Cardiovascular system:S1 &S2 heard, No murmurs  Gastrointestinal system:Abdomen soft, non-tender, nondistended. Normal bowel sound. No organomegaly  Central nervous system:Alert and oriented. No focal neurological deficits. Extremities: No cyanosis, clubbing or edema    Follow-up Information    Marton Redwood, MD. Call.   Specialty:  Internal Medicine Why:  please call PCP for INR check on 1/9 and 1/13 all of the PCP in 3-5 days Follow-up CBC and CMP in 3-5 days Contact information: [place] [address] [phone]        Burnell Blanks, MD .   Specialty:  Cardiology Contact information: [place]. [address] [place] [address] [phone]           Signed: Reyne Dumas 07/16/2018, 3:00 PM      Time needed to  prepare  discharge, discussed with the patient and family 35 minutes

## 2018-07-17 ENCOUNTER — Telehealth: Payer: Self-pay | Admitting: *Deleted

## 2018-07-17 ENCOUNTER — Ambulatory Visit (INDEPENDENT_AMBULATORY_CARE_PROVIDER_SITE_OTHER): Payer: Medicare Other | Admitting: *Deleted

## 2018-07-17 DIAGNOSIS — Z7901 Long term (current) use of anticoagulants: Secondary | ICD-10-CM | POA: Diagnosis not present

## 2018-07-17 DIAGNOSIS — I480 Paroxysmal atrial fibrillation: Secondary | ICD-10-CM | POA: Diagnosis not present

## 2018-07-17 DIAGNOSIS — I4819 Other persistent atrial fibrillation: Secondary | ICD-10-CM

## 2018-07-17 LAB — POCT INR: INR: 1.3 — AB (ref 2.0–3.0)

## 2018-07-17 NOTE — Telephone Encounter (Signed)
Oncology Nurse Navigator Documentation  Oncology Nurse Navigator Flowsheets 07/17/2018  Navigator Location [facility]  Navigator Encounter Type Telephone/I received a message from Dr. Gearldine Shown nurse that patient's daughter called with questions.  I called to clarify.  She is not sure if her dad needs to see Dr. Servando Snare again.  I contacted Dr. Servando Snare to get an update.  I will let her know what he would like.  She is also waiting for rad onc to call with an appt. I contacted Rad Onc scheduling team and update them on referral and the daughter's phone number to call. She was thankful for the call.   Telephone Outgoing Call  Treatment Phase Pre-Tx/Tx Discussion  Barriers/Navigation Needs Education  Education Other  Interventions Education  Education Method Verbal  Acuity Level 2  Time Spent with Patient 30

## 2018-07-17 NOTE — Patient Instructions (Signed)
Description   Continue taking 1 tablet daily except 1.5 tablets on Fridays. Recheck INR in one  Week.  Call with any questions new medications or if scheduled for any other  procedures [phone]

## 2018-07-18 NOTE — Progress Notes (Signed)
Thoracic Location of Tumor / Histology: Left Upper Lobe lung -Not a surgical candidate   Patient presented for routine screening images for colon cancer management.  PET 06/26/2018: within the left upper lobe solid and sub-solid nodule is again noted measuring up to 20 mm including the sub solid portion.  This central solid portion measures 11 mm and has associated metabolic activity(SUV max equal to 4.5).  CT Chest 06/03/2018: Again seen is a part solid nodule within the anterolateral left upper lobe. On today's exam this measures 2.1 by 1.4 cm and has a central solid component measuring 1.3 cm, image 71/7. Previously this measured 1.9 x 1.3 cm with a central solid component measuring 0.5 cm. Pure ground-glass attenuating nodule within the superior segment of left lower lobe measures 1.4 cm, image 37/7. Stable Upper Abdomen: No acute abnormality within the abdomen.  The central solid component now measures 1.3 cm. Adenocarcinoma is considered. Thoracic surgery consultation is recommended. PET/CT should be considered for staging purposes.  Stable pure ground-glass attenuating nodule within the superior segment of left lower lobe measuring 1.4 cm. Adenocarcinoma cannot be excluded.   Biopsies of LUL lung 07/09/2018   Tobacco/Marijuana/Snuff/ETOH use: Former smoker, quit in 1983  Past/Anticipated interventions by cardiothoracic surgery, if any:  Dr. Servando Snare 79/08/4095 -Hypermetabolic left upper lobe pulmonary nodule.  The solid portion of this nodule is increased over time.  Findings concerning for Bronchogenic Carcinoma.  With his medical problems including CAD, afib I have recommended that we proceed with ct guided needle bx of the left lung lesion and depending on findings consider stereotactic radiation therapy.  I will plan to see back after bx and discuss treatment options.   Past/Anticipated interventions by medical oncology, if any:  Dr. Benay Spice 06/07/2018 -[name] remains in  clinical remission from colon cancer.  He will return for an office visit and CEA in 6 months. -I reviewed the chest CT images with him.  There are 2 inflammatory appearing lesions in the left lung versus indolent tumors.  I doubt these lesions represent metastatic colon cancer, but could represent indolent lung adenocarcinomas.    Signs/Symptoms  Weight changes, if any: Roughly 6 pounds down, attributed to hospital stay.  Respiratory complaints, if any:   Hemoptysis, if any:   Pain issues, if any:    BP (!) 150/59 (BP Location: Left Arm, Patient Position: Sitting)   Pulse (!) 52   Resp 10   Ht '5\' 11"'  (1.803 m)   Wt 168 lb (76.2 kg)   SpO2 99%   BMI 23.43 kg/m    Wt Readings from Last 3 Encounters:  07/23/18 168 lb (76.2 kg)  07/16/18 165 lb 8 oz (75.1 kg)  06/27/18 173 lb 3.2 oz (78.6 kg)    SAFETY ISSUES:  Prior radiation? No  Pacemaker/ICD? No  Possible current pregnancy? No  Is the patient on methotrexate? No  Current Complaints / other details:   1. Sigmoid colon cancer, stage II (T3N0), status post a sigmoidectomy 12/26/2017 ? Tumor involving the sigmoid colon and proximal rectum ? MSI-stable, no loss of mismatch repair protein expression ? Staging CTs of the chest, abdomen, and pelvis on 11/20/2017- focal wall thickening at the distal sigmoid colon, no evidence of metastatic disease, indeterminate 1.9 cm sub-solid left upper lobe nodule

## 2018-07-19 DIAGNOSIS — Z7901 Long term (current) use of anticoagulants: Secondary | ICD-10-CM | POA: Diagnosis not present

## 2018-07-19 DIAGNOSIS — R918 Other nonspecific abnormal finding of lung field: Secondary | ICD-10-CM | POA: Diagnosis not present

## 2018-07-19 DIAGNOSIS — Z9861 Coronary angioplasty status: Secondary | ICD-10-CM | POA: Diagnosis not present

## 2018-07-19 DIAGNOSIS — J939 Pneumothorax, unspecified: Secondary | ICD-10-CM | POA: Diagnosis not present

## 2018-07-19 DIAGNOSIS — Z6823 Body mass index (BMI) 23.0-23.9, adult: Secondary | ICD-10-CM | POA: Diagnosis not present

## 2018-07-19 DIAGNOSIS — N179 Acute kidney failure, unspecified: Secondary | ICD-10-CM | POA: Diagnosis not present

## 2018-07-19 DIAGNOSIS — I480 Paroxysmal atrial fibrillation: Secondary | ICD-10-CM | POA: Diagnosis not present

## 2018-07-19 DIAGNOSIS — I10 Essential (primary) hypertension: Secondary | ICD-10-CM | POA: Diagnosis not present

## 2018-07-19 DIAGNOSIS — I255 Ischemic cardiomyopathy: Secondary | ICD-10-CM | POA: Diagnosis not present

## 2018-07-22 ENCOUNTER — Ambulatory Visit (INDEPENDENT_AMBULATORY_CARE_PROVIDER_SITE_OTHER): Payer: Medicare Other

## 2018-07-22 DIAGNOSIS — I480 Paroxysmal atrial fibrillation: Secondary | ICD-10-CM | POA: Diagnosis not present

## 2018-07-22 DIAGNOSIS — Z7901 Long term (current) use of anticoagulants: Secondary | ICD-10-CM | POA: Diagnosis not present

## 2018-07-22 LAB — POCT INR: INR: 2.3 (ref 2.0–3.0)

## 2018-07-22 NOTE — Patient Instructions (Signed)
Description   Continue on same dosage 1 tablet daily except 1.5 tablets on Fridays. Recheck INR in 2 weeks.  Call with any questions new medications or if scheduled for any other  procedures [phone]

## 2018-07-23 ENCOUNTER — Telehealth: Payer: Self-pay

## 2018-07-23 ENCOUNTER — Other Ambulatory Visit: Payer: Self-pay

## 2018-07-23 ENCOUNTER — Ambulatory Visit
Admission: RE | Admit: 2018-07-23 | Discharge: 2018-07-23 | Disposition: A | Discharge status: Home / Self Care | Payer: Medicare Other | Source: Ambulatory Visit | Attending: Radiation Oncology | Admitting: Radiation Oncology

## 2018-07-23 ENCOUNTER — Encounter: Payer: Self-pay | Admitting: Radiation Oncology

## 2018-07-23 VITALS — BP 150/59 | HR 52 | Resp 10 | Ht 71.0 in | Wt 168.0 lb

## 2018-07-23 DIAGNOSIS — C3411 Malignant neoplasm of upper lobe, right bronchus or lung: Secondary | ICD-10-CM | POA: Insufficient documentation

## 2018-07-23 DIAGNOSIS — I129 Hypertensive chronic kidney disease with stage 1 through stage 4 chronic kidney disease, or unspecified chronic kidney disease: Secondary | ICD-10-CM | POA: Diagnosis not present

## 2018-07-23 DIAGNOSIS — I4891 Unspecified atrial fibrillation: Secondary | ICD-10-CM | POA: Diagnosis not present

## 2018-07-23 DIAGNOSIS — I252 Old myocardial infarction: Secondary | ICD-10-CM | POA: Insufficient documentation

## 2018-07-23 DIAGNOSIS — G629 Polyneuropathy, unspecified: Secondary | ICD-10-CM | POA: Diagnosis not present

## 2018-07-23 DIAGNOSIS — M129 Arthropathy, unspecified: Secondary | ICD-10-CM | POA: Diagnosis not present

## 2018-07-23 DIAGNOSIS — Z87891 Personal history of nicotine dependence: Secondary | ICD-10-CM | POA: Diagnosis not present

## 2018-07-23 DIAGNOSIS — N189 Chronic kidney disease, unspecified: Secondary | ICD-10-CM | POA: Diagnosis not present

## 2018-07-23 DIAGNOSIS — C3412 Malignant neoplasm of upper lobe, left bronchus or lung: Secondary | ICD-10-CM

## 2018-07-23 DIAGNOSIS — I700 Atherosclerosis of aorta: Secondary | ICD-10-CM | POA: Insufficient documentation

## 2018-07-23 DIAGNOSIS — N400 Enlarged prostate without lower urinary tract symptoms: Secondary | ICD-10-CM | POA: Insufficient documentation

## 2018-07-23 DIAGNOSIS — Z85038 Personal history of other malignant neoplasm of large intestine: Secondary | ICD-10-CM | POA: Insufficient documentation

## 2018-07-23 DIAGNOSIS — R933 Abnormal findings on diagnostic imaging of other parts of digestive tract: Secondary | ICD-10-CM | POA: Diagnosis not present

## 2018-07-23 DIAGNOSIS — I739 Peripheral vascular disease, unspecified: Secondary | ICD-10-CM | POA: Diagnosis not present

## 2018-07-23 DIAGNOSIS — E538 Deficiency of other specified B group vitamins: Secondary | ICD-10-CM | POA: Diagnosis not present

## 2018-07-23 DIAGNOSIS — Z79899 Other long term (current) drug therapy: Secondary | ICD-10-CM | POA: Insufficient documentation

## 2018-07-23 DIAGNOSIS — Z7901 Long term (current) use of anticoagulants: Secondary | ICD-10-CM | POA: Diagnosis not present

## 2018-07-23 DIAGNOSIS — C187 Malignant neoplasm of sigmoid colon: Secondary | ICD-10-CM | POA: Insufficient documentation

## 2018-07-23 DIAGNOSIS — E785 Hyperlipidemia, unspecified: Secondary | ICD-10-CM | POA: Insufficient documentation

## 2018-07-23 NOTE — Progress Notes (Signed)
Radiation Oncology         [phone] ________________________________  Name: [name]        MRN: [id number]  Date of Service: 07/23/2018 DOB: January 27, 1930  WU:[id number], Gwyndolyn Saxon, MD  Ladell Pier, MD     REFERRING PHYSICIAN: Ladell Pier, MD   DIAGNOSIS: The encounter diagnosis was Malignant neoplasm of upper lobe bronchus, left ([place]).   HISTORY OF PRESENT ILLNESS: [name] is a 85 y.o. male seen at the request of Dr. Servando Snare for a newly diagnosed NSCLC of the RUL with a history of pT3N0 adenocarcinoma of the sigmoid colon. The patient was found to have an adenocarcinoma of the sigmoid colon in the spring of 2019. His work up with CT chest on 11/20/17 revealed a nodule measuring 1.9 cm in the RUL, and he went on to proceed with robotic sigmoidectomy on 12/26/17 with Dr. Marcello Moores and this revealed a T3N0 cancer. He was counseled on surveillance, and he had repeat CT of the chest on 06/03/18 that revealed the lesion in the LUL measuring 2.1 x 1.4 cm and there was a solid component in the nodule that was larger at 1.3 cm, compared to 5 mm on prior scan. There was also a stable ground glass nodule also seen in the LLL measuring 1.4 cm. He was counseled on a PET scan and this was performed on 06/26/18 and revealed hypermetabolic change in the stomach with an SUV of 14.2, and hypermetabolism in the LUL lesion with an SUV of 4.5. He did undergo a CT guided biopsy on 07/09/18 that revealed an adenocarcinoma consistent with lung primary. He had a complicated course following his biopsy requiring a chest tube and hospitalization. His chest tube was dislodged accidentally by the patient and had to be replaced. Dr. Servando Snare has referred the patient to Dr. Lisbeth Renshaw to consider stereotactic body radiotherapy (SBRT) as an alternative to surgery as he was not felt to be a candidate for surgical resection. Of note, he's getting back on Dr. Blanch Media schedule to further evaluate the activity seen on PET in  the stomach.    PREVIOUS RADIATION THERAPY: No   PAST MEDICAL HISTORY:  Past Medical History:  Diagnosis Date  . A-fib ([place])   . Arthritis   . BPH (benign prostatic hyperplasia)   . Chest pain, unspecified   . Chronic kidney disease   . Coronary atherosclerosis of unspecified type of vessel, native or graft   . Dizziness and giddiness   . Hyperlipidemia   . Hypertension   . Myocardial infarction ([place])    x2  . Non-ST elevation (NSTEMI) myocardial infarction ([place])   . Peripheral neuropathy   . PVD (peripheral vascular disease) ([place])   . Shortness of breath    exertion  . Vitamin B12 deficiency        PAST SURGICAL HISTORY: Past Surgical History:  Procedure Laterality Date  . COLON SURGERY     partial colectomy Dr. Marcello Moores 12-14-17  . CORONARY ANGIOPLASTY     4 stents  . CORONARY STENT INTERVENTION N/A 08/09/2017   Procedure: CORONARY STENT INTERVENTION;  Surgeon: Nelva Bush, MD;  Location: [facility];  Service: Cardiovascular;  Laterality: N/A;  . FLEXIBLE SIGMOIDOSCOPY N/A 11/12/2017   Procedure: FLEXIBLE SIGMOIDOSCOPY;  Surgeon: Irene Shipper, MD;  Location: [facility];  Service: Endoscopy;  Laterality: N/A;  . HERNIA REPAIR     right inguinal  . INGUINAL HERNIA REPAIR  06/14/2012   Procedure: HERNIA REPAIR INGUINAL ADULT;  Surgeon: Odis Hollingshead, MD;  Location: [facility];  Service: General;  Laterality: Right;  . INSERTION OF MESH  06/14/2012   Procedure: INSERTION OF MESH;  Surgeon: Odis Hollingshead, MD;  Location: [facility];  Service: General;  Laterality: Right;  . LAPAROSCOPIC PARTIAL COLECTOMY N/A 12/26/2017   Procedure: LAPAROSCOPIC PARTIAL COLECTOMY ERAS PATHWAY;  Surgeon: Leighton Ruff, MD;  Location: [facility];  Service: General;  Laterality: N/A;  . LEFT HEART CATH AND CORONARY ANGIOGRAPHY N/A 08/09/2017   Procedure: LEFT HEART CATH AND CORONARY ANGIOGRAPHY;  Surgeon: Nelva Bush, MD;  Location: [facility];  Service: Cardiovascular;   Laterality: N/A;  . PROCTOSCOPY N/A 12/26/2017   Procedure: PROCTOSCOPY;  Surgeon: Leighton Ruff, MD;  Location: [facility];  Service: General;  Laterality: N/A;  . SINUS SURGERY WITH INSTATRAK  1985     FAMILY HISTORY:  Family History  Problem Relation Age of Onset  . Heart attack Father 75  . Arthritis Sister   . Hypothyroidism Daughter      SOCIAL HISTORY:  reports that he quit smoking about 37 years ago. He has never used smokeless tobacco. He reports that he does not drink alcohol or use drugs.   ALLERGIES: Gadolinium derivatives   MEDICATIONS:  Current Outpatient Medications  Medication Sig Dispense Refill  . atorvastatin (LIPITOR) 40 MG tablet TAKE ONE TABLET DAILY AT 6PM (Patient taking differently: Take 40 mg by mouth daily. ) 90 tablet 2  . clopidogrel (PLAVIX) 75 MG tablet TAKE ONE TABLET EACH DAY (Patient taking differently: Take 75 mg by mouth daily. ) 90 tablet 3  . guaiFENesin (MUCINEX) 600 MG 12 hr tablet Take 1 tablet (600 mg total) by mouth 2 (two) times daily. 20 tablet 0  . lisinopril (PRINIVIL,ZESTRIL) 5 MG tablet Take 0.5 tablets (2.5 mg total) by mouth daily. 45 tablet   . metoprolol succinate (TOPROL-XL) 25 MG 24 hr tablet Take 3 tablets (75 mg total) by mouth daily. 90 tablet 9  . pantoprazole (PROTONIX) 40 MG tablet Take 1 tablet (40 mg total) by mouth daily. 30 tablet 2  . vitamin B-12 (CYANOCOBALAMIN) 1000 MCG tablet Take 1,000 mcg by mouth daily.    Marland Kitchen warfarin (COUMADIN) 5 MG tablet TAKE AS DIRECTED BY COUMADIN CLINIC (Patient taking differently: Take 5-7.5 mg by mouth See admin instructions. Take as directed by coumadin clinic; 7.5 mg (5 mg x 1.5) every Fri; 5 mg (5 mg x 1) all other days) 35 tablet 2   No current facility-administered medications for this encounter.      REVIEW OF SYSTEMS: On review of systems, the patient reports that he is doing well overall. He does report some tightness in the chest with deep inspiration though he's been able to  tolerate breathing much better in the last few weeks however. He reports a dry cough, but after his chest tube, had some mild productive mucous.  He denies any fevers, chills, night sweats, unintended weight changes. He has had some issues with digestion, occasional constipation, and feeling like he can get a bit nauseated at times.  He denies any bladder disturbances, and denies abdominal pain or vomiting. He denies any new musculoskeletal or joint aches or pains. A complete review of systems is obtained and is otherwise negative.     PHYSICAL EXAM:  Wt Readings from Last 3 Encounters:  07/23/18 168 lb (76.2 kg)  07/16/18 165 lb 8 oz (75.1 kg)  06/27/18 173 lb 3.2 oz (78.6 kg)   Temp Readings  from Last 3 Encounters:  07/16/18 97.8 F (36.6 C) (Oral)  06/07/18 97.6 F (36.4 C) (Oral)   BP Readings from Last 3 Encounters:  07/23/18 (!) 150/59  07/16/18 122/76  06/27/18 140/68   Pulse Readings from Last 3 Encounters:  07/23/18 (!) 52  07/16/18 68  06/27/18 61   Pain Assessment Pain Score: 2 (Normal pains that come with aging.)/10  In general this is a well appearing caucasian male in no acute distress. He is alert and oriented x4 and appropriate throughout the examination. HEENT reveals that the patient is normocephalic, atraumatic. EOMs are intact.  Skin is intact without any evidence of gross lesions. Cardiovascular exam reveals a regular rate and rhythm, no clicks rubs or murmurs are auscultated. Chest is clear to auscultation bilaterally.   ECOG = 0  0 - Asymptomatic (Fully active, able to carry on all predisease activities without restriction)  1 - Symptomatic but completely ambulatory (Restricted in physically strenuous activity but ambulatory and able to carry out work of a light or sedentary nature. For example, light housework, office work)  2 - Symptomatic, <50% in bed during the day (Ambulatory and capable of all self care but unable to carry out any work activities. Up  and about more than 50% of waking hours)  3 - Symptomatic, >50% in bed, but not bedbound (Capable of only limited self-care, confined to bed or chair 50% or more of waking hours)  4 - Bedbound (Completely disabled. Cannot carry on any self-care. Totally confined to bed or chair)  5 - Death   Eustace Pen MM, Creech RH, Tormey DC, et al. [phone]). "Toxicity and response criteria of the [facility] Group". [place] Oncol. 5 (6): 649-55    LABORATORY DATA:  Lab Results  Component Value Date   WBC 8.4 07/13/2018   HGB 12.9 (L) 07/13/2018   HCT 39.3 07/13/2018   MCV 88.5 07/13/2018   PLT 235 07/13/2018   Lab Results  Component Value Date   NA 137 07/16/2018   K 3.9 07/16/2018   CL 103 07/16/2018   CO2 26 07/16/2018   Lab Results  Component Value Date   ALT 19 07/16/2018   AST 23 07/16/2018   ALKPHOS 60 07/16/2018   BILITOT 0.9 07/16/2018      RADIOGRAPHY: Dg Chest 2 View  Result Date: 07/14/2018 CLINICAL DATA:  Pneumothorax. EXAM: CHEST - 2 VIEW COMPARISON:  07/13/2018, 07/10/2018 and CT 07/13/2018 FINDINGS: Interval placement of a left pigtail pleural catheter with tip over the lateral mid thorax. No surgical left-sided pneumothorax visualized. Lungs are hypoinflated with mild stable opacification over the medial lung bases likely small amount of layering pleural fluid and atelectasis. Small nodule opacity over the lateral left midlung not well seen on the recent prior exams. Minimal prominence of the central pulmonary vessels unchanged. Mild stable cardiomegaly. Moderate subcutaneous emphysema over the thorax unchanged. Remainder of the exam is unchanged. IMPRESSION: Stable medial bibasilar opacification likely layering effusions with atelectasis. Left pleural catheter in place. No residual left-sided pneumothorax visualized. Small nodular density over the lateral left midlung not well seen on recent prior exams. Recommend attention on follow-up. Electronically Signed    By: Marin Olp M.D.   On: 07/14/2018 07:16   Dg Chest 2 View  Result Date: 07/13/2018 CLINICAL DATA:  Left pneumothorax after recent lung biopsy. EXAM: CHEST - 2 VIEW COMPARISON:  07/12/2018 FINDINGS: The heart size and mediastinal contours are within normal limits. There is slight enlargement of left  apical and lateral pneumothorax estimated to be roughly 10-15% in volume. There also is increase in subcutaneous emphysema across the chest wall and extending into the neck. Bibasilar atelectasis present. No pulmonary edema or pleural fluid identified. The visualized skeletal structures are unremarkable. IMPRESSION: Slight enlargement of left apical and lateral pneumothorax estimated to be 10-15% in volume. There also is increase in subcutaneous emphysema in the chest wall and neck. Electronically Signed   By: Aletta Edouard M.D.   On: 07/13/2018 09:24   Nm Pet Image Initial (pi) Skull Base To Thigh  Result Date: 06/26/2018 CLINICAL DATA:  Initial treatment strategy for sigmoid colon carcinoma. EXAM: NUCLEAR MEDICINE PET SKULL BASE TO THIGH TECHNIQUE: 9.6 mCi F-18 FDG was injected intravenously. Full-ring PET imaging was performed from the skull base to thigh after the radiotracer. CT data was obtained and used for attenuation correction and anatomic localization. Fasting blood glucose: 99 mg/dl COMPARISON:  None. FINDINGS: Mediastinal blood pool activity: SUV max 1.81 NECK: No hypermetabolic lymph nodes in the neck. Incidental CT findings: none CHEST: Within the LEFT upper lobe solid and sub solid nodule is again noted measuring up to 20 mm including the sub solid portion. This central solid portion measures 11 mm and has associated metabolic activity (SUV max equal 4.5). No additional hypermetabolic pulmonary nodules. No hypermetabolic mediastinal lymph nodes. Incidental CT findings: Coronary artery calcification and aortic atherosclerotic calcification. ABDOMEN/PELVIS: There is intense circumferential  metabolic activity involving the gastric fundus, gastric body and extending to the gastric antrum. Activity is intense with SUV max equal 14.2. There is no mass lesion identified on CT portion of exam. There is mild activity through the distal esophagus from the level the carina to the GE junction. No abnormal activity in the liver. No hypermetabolic abdominopelvic lymph nodes. Symmetric activity in the adrenal glands noted. Prostate is enlarged. Anastomosis in the sigmoid colon noted. No evidence obstruction no metabolic at nodularity. No hypermetabolic pelvic lymph nodes. Incidental CT findings: none SKELETON: No focal hypermetabolic activity to suggest skeletal metastasis. Incidental CT findings: none IMPRESSION: 1. Hypermetabolic LEFT upper lobe pulmonary nodule. The solid portion of this nodule is increased over time. Findings concerning for BRONCHOGENIC CARCINOMA. 2. Intense radiotracer activity within the gastric fundus and body which is greater than normal physiologic activity. Differential would include gastritis versus lymphoma or primary gastric malignancy. As there is no mass lesion identified, malignancy is less favored. Recommend upper endoscopy and potentially tissue sampling. 3. Diffuse activity associated the distal esophagus may relate to abnormal hypermetabolic stomach. 4. No evidence of local colorectal carcinoma at the sigmoid anastomosis. 5. No evidence of metastatic disease in the abdomen pelvis. Electronically Signed   By: Suzy Bouchard M.D.   On: 06/26/2018 17:10   Ct Biopsy  Result Date: 07/09/2018 INDICATION: 85 year old with a suspicious lesion in the left upper lobe. History of colon cancer. Plan for CT-guided left upper lobe nodule biopsy. EXAM: CT-GUIDED BIOPSY OF LEFT UPPER LOBE PULMONARY NODULE CT-GUIDED PLACEMENT OF LEFT CHEST TUBE MEDICATIONS: None. ANESTHESIA/SEDATION: Moderate (conscious) sedation was employed during this procedure. A total of Versed 1.5 mg and Fentanyl  50 mcg was administered intravenously. Moderate Sedation Time: 32 minutes. The patient's level of consciousness and vital signs were monitored continuously by radiology nursing throughout the procedure under my direct supervision. FLUOROSCOPY TIME:  None COMPLICATIONS: Pneumothorax that required chest tube placement. PROCEDURE: Informed written consent was obtained from the patient after a thorough discussion of the procedural risks, benefits and alternatives. All questions were addressed.  A timeout was performed prior to the initiation of the procedure. Patient was placed supine. CT images through the chest were obtained. Small lesion in the periphery of the left upper lobe was identified and targeted. The anterior chest was prepped with chlorhexidine and sterile field was created. Maximal barrier sterile technique was utilized including caps, mask, sterile gowns, sterile gloves, sterile drape, hand hygiene and skin antiseptic. Skin was anesthetized with 1% lidocaine. A 17 gauge coaxial needle was directed into the left lung with CT guidance and there was an immediate left pneumothorax. Left pneumothorax continued to enlarge over a few minutes although the patient was asymptomatic. Decision was made to place a chest tube. Amplatz wire was advanced through the existing needle and the tract was dilated to accommodate a 10.2 [place] multipurpose drain. Drain was attached to a PleurEvac and wall suction. After placement to suction, the pneumothorax resolved. A 17 gauge needle was then directed slightly lateral to the existing chest tube and into the lesion with CT guidance. Two core biopsies were obtained with an 18 gauge core device. Specimens placed in formalin. Needle was removed without complication. The chest tube was sutured to the skin. FINDINGS: Small irregular opacity in the periphery of the left upper lobe. Patient immediately developed a pneumothorax following placement of the needle into the left lung. The  pneumothorax was enlarging over a few minutes although the patient remained asymptomatic. Chest tube was required in order to perform the biopsy. Chest tube successfully placed along the anterior left lung. Biopsy needle was confirmed within the lesion. Two core biopsies were obtained. At the end of the procedure, there was minimal parenchymal hemorrhage around the lesion. Pneumothorax had essentially resolved with the left anterior chest tube. IMPRESSION: CT-guided biopsy of the left upper lobe lung lesion. Procedure was complicated by a pneumothorax. Pneumothorax was successfully treated with a CT-guided chest tube placement. Patient will be admitted for chest tube management. Electronically Signed   By: Markus Daft M.D.   On: 07/09/2018 16:35   Dg Chest Port 1 View  Result Date: 07/16/2018 CLINICAL DATA:  Pneumothorax after left lung biopsy. EXAM: PORTABLE CHEST 1 VIEW COMPARISON:  Earlier same day FINDINGS: 1530 hours. Subcutaneous emphysema again noted bilaterally. There is probably a skin fold over the left hemithorax although residual pleural gas is also possibility given the apparent deep sulcus sign. Patient has a skin fold projecting over the right lung. The cardio pericardial silhouette is enlarged. Interstitial markings are diffusely coarsened with chronic features. Telemetry leads overlie the chest. IMPRESSION: Interval removal of left pleural drain. Probable skin fold over the left mid lung although residual pleural gas can not be entirely excluded. Electronically Signed   By: Misty Stanley M.D.   On: 07/16/2018 15:44   Dg Chest Port 1 View  Result Date: 07/16/2018 CLINICAL DATA:  Pneumothorax after left lung biopsy.  Chest tube. EXAM: PORTABLE CHEST 1 VIEW COMPARISON:  07/14/2018 FINDINGS: Pigtail chest tube on the left is unchanged in position. No residual pneumothorax or effusion on the left. Bibasilar atelectasis/infiltrate unchanged. Negative for edema. Small nodule left mid lung adjacent to  the chest tube is less apparent on today's study. Subcutaneous gas is present in the chest bilaterally right greater than left. No pneumothorax on the right. IMPRESSION: Left chest tube remains in place.  No pneumothorax Bibasilar atelectasis/infiltrate unchanged. Electronically Signed   By: Franchot Gallo M.D.   On: 07/16/2018 06:53   Dg Chest Port 1 View  Result Date: 07/12/2018 CLINICAL  DATA:  Left pneumothorax. EXAM: PORTABLE CHEST 1 VIEW COMPARISON:  Radiograph of July 11, 2018. FINDINGS: Stable cardiomediastinal silhouette. Atherosclerosis of thoracic aorta is noted. Right lung is clear. Stable mild left apical pneumothorax is noted with minimal left basilar component. Mild left basilar subsegmental atelectasis is noted. Mild amount of subcutaneous emphysema seen over left lateral chest wall left supraclavicular region. Bony thorax is unremarkable. IMPRESSION: Mild left apical pneumothorax with minimal left basilar component. Subcutaneous emphysema is seen over left lateral chest wall supraclavicular region. Aortic Atherosclerosis (ICD10-I70.0). Electronically Signed   By: Marijo Conception, M.D.   On: 07/12/2018 08:19   Dg Chest Port 1 View  Result Date: 07/11/2018 CLINICAL DATA:  Follow-up left pneumothorax. EXAM: PORTABLE CHEST 1 VIEW COMPARISON:  Chest x-ray from yesterday. FINDINGS: Interval removal of the left pigtail chest tube with recurrent small left pneumothorax. The heart size and mediastinal contours are within normal limits. Normal pulmonary vascularity. No consolidation or pleural effusion. No acute osseous abnormality. Subcutaneous emphysema in the left chest wall. IMPRESSION: 1. Recurrent small left pneumothorax. These results will be called to the ordering clinician or representative by the Radiologist Assistant, and communication documented in the PACS or zVision Dashboard. Electronically Signed   By: Titus Dubin M.D.   On: 07/11/2018 17:00   Dg Chest Port 1 View  Result Date:  07/10/2018 CLINICAL DATA:  Pneumothorax after biopsy EXAM: PORTABLE CHEST - 1 VIEW COMPARISON:  the previous day's study FINDINGS: The left pigtail chest tube has retracted somewhat, sideholes still projecting within the left hemithorax. No convincing residual apical pneumothorax visualized on this portable exam. Some mild subsegmental atelectasis in the lung bases. Heart size and mediastinal contours are within normal limits. Aortic Atherosclerosis (ICD10-170.0). Coronary stents. No effusion. Visualized bones unremarkable. IMPRESSION: 1. Partial retraction of chest tube.  No definite pneumothorax. Electronically Signed   By: Lucrezia Europe M.D.   On: 07/10/2018 09:03   Dg Chest Port 1 View  Result Date: 07/09/2018 CLINICAL DATA:  Left chest tube. EXAM: PORTABLE CHEST 1 VIEW COMPARISON:  08/08/2017.  Chest CT for biopsy earlier today. FINDINGS: Left chest tube is in place. Tiny left apical pneumothorax. Heart is normal size. Coronary artery stents noted. No confluent airspace opacities or effusions. IMPRESSION: Left chest tube in place with tiny left apical pneumothorax. Electronically Signed   By: Rolm Baptise M.D.   On: 07/09/2018 16:57   Ct Perc Pleural Drain W/indwell Cath W/img Guide  Result Date: 07/14/2018 INDICATION: Recurrent and enlarging left pneumothorax following percutaneous biopsy of left upper lobe lung nodule on 07/09/2018. A pigtail chest tube was placed at that time and inadvertently removed on 07/11/2018. Left-sided pneumothorax has been increasing since that time with increased oxygen requirement. The patient now presents for new left chest tube placement. EXAM: CT-GUIDED INSERTION OF PERCUTANEOUS PLEURAL DRAINAGE CATHETER MEDICATIONS: No additional medications other than sedative medicines listed below. ANESTHESIA/SEDATION: Fentanyl 50 mcg IV; Versed 1.0 mg IV Moderate Sedation Time:  15 minutes. The patient was continuously monitored during the procedure by the interventional radiology  nurse under my direct supervision. COMPLICATIONS: None immediate. PROCEDURE: Informed written consent was obtained from the patient after a thorough discussion of the procedural risks, benefits and alternatives. All questions were addressed. Maximal Sterile Barrier Technique was utilized including caps, mask, sterile gowns, sterile gloves, sterile drape, hand hygiene and skin antiseptic. A timeout was performed prior to the initiation of the procedure. CT was performed in a supine position through the mid to lower chest.  After choosing a site for percutaneous entry along the lower anterior left chest wall, an 18 gauge trocar needle was advanced into the anterior pleural space. After confirming needle tip position, a guidewire was advanced and the needle removed. The tract was dilated and a 12 French pigtail catheter advanced into the pleural space. Catheter position was confirmed by CT after placement. The catheter was secured at the skin with a Prolene retention suture, StatLock device and Vaseline gauze dressing. The catheter was connected to a [place] Pleur-evac device and attached to wall suction at -20 cm of water. FINDINGS: Since chest x-ray earlier this morning at 0900 hours, there is definite significant increase in size of the left pneumothorax now estimated to be 40-50% in volume. Bilateral lower lobe atelectasis present as well as small bilateral pleural effusions. Subcutaneous air is present in the anterior chest wall. The 12 French catheter was advanced into the lateral left pleural space and angled to ascend superiorly. IMPRESSION: Placement of 12 French left-sided pleural thoracostomy tube to treat recurrent and enlarging left pneumothorax. The catheter was attached to a Pleur-evac device and attached to wall suction at -20 cm of water. Electronically Signed   By: Aletta Edouard M.D.   On: 07/14/2018 08:17   Ct Perc Pleural Drain W/indwell Cath W/img Guide  Result Date: 07/09/2018 INDICATION:  85 year old with a suspicious lesion in the left upper lobe. History of colon cancer. Plan for CT-guided left upper lobe nodule biopsy. EXAM: CT-GUIDED BIOPSY OF LEFT UPPER LOBE PULMONARY NODULE CT-GUIDED PLACEMENT OF LEFT CHEST TUBE MEDICATIONS: None. ANESTHESIA/SEDATION: Moderate (conscious) sedation was employed during this procedure. A total of Versed 1.5 mg and Fentanyl 50 mcg was administered intravenously. Moderate Sedation Time: 32 minutes. The patient's level of consciousness and vital signs were monitored continuously by radiology nursing throughout the procedure under my direct supervision. FLUOROSCOPY TIME:  None COMPLICATIONS: Pneumothorax that required chest tube placement. PROCEDURE: Informed written consent was obtained from the patient after a thorough discussion of the procedural risks, benefits and alternatives. All questions were addressed. A timeout was performed prior to the initiation of the procedure. Patient was placed supine. CT images through the chest were obtained. Small lesion in the periphery of the left upper lobe was identified and targeted. The anterior chest was prepped with chlorhexidine and sterile field was created. Maximal barrier sterile technique was utilized including caps, mask, sterile gowns, sterile gloves, sterile drape, hand hygiene and skin antiseptic. Skin was anesthetized with 1% lidocaine. A 17 gauge coaxial needle was directed into the left lung with CT guidance and there was an immediate left pneumothorax. Left pneumothorax continued to enlarge over a few minutes although the patient was asymptomatic. Decision was made to place a chest tube. Amplatz wire was advanced through the existing needle and the tract was dilated to accommodate a 10.2 [place] multipurpose drain. Drain was attached to a PleurEvac and wall suction. After placement to suction, the pneumothorax resolved. A 17 gauge needle was then directed slightly lateral to the existing chest tube and into the  lesion with CT guidance. Two core biopsies were obtained with an 18 gauge core device. Specimens placed in formalin. Needle was removed without complication. The chest tube was sutured to the skin. FINDINGS: Small irregular opacity in the periphery of the left upper lobe. Patient immediately developed a pneumothorax following placement of the needle into the left lung. The pneumothorax was enlarging over a few minutes although the patient remained asymptomatic. Chest tube was required in order to  perform the biopsy. Chest tube successfully placed along the anterior left lung. Biopsy needle was confirmed within the lesion. Two core biopsies were obtained. At the end of the procedure, there was minimal parenchymal hemorrhage around the lesion. Pneumothorax had essentially resolved with the left anterior chest tube. IMPRESSION: CT-guided biopsy of the left upper lobe lung lesion. Procedure was complicated by a pneumothorax. Pneumothorax was successfully treated with a CT-guided chest tube placement. Patient will be admitted for chest tube management. Electronically Signed   By: Markus Daft M.D.   On: 07/09/2018 16:35       IMPRESSION/PLAN: 1. Stage IA3, cT1cN0M0, NSCLC, adenocarcinoma of the RUL of the lung. Dr. Lisbeth Renshaw discusses the pathology findings and reviews the nature of early stage lung cancer. While surgery would be the standard, his comorbidities and age would prohibit him from a safe surgery. Rather, we discussed the options of stereotactic body radiotherapy (SBRT) as an alternative. We discussed the risks, benefits, short, and long term effects of radiotherapy, and the patient is interested in proceeding. Dr. Lisbeth Renshaw discusses the delivery and logistics of radiotherapy and anticipates a course of 5 fractions of radiotherapy. Written consent is obtained and placed in the chart, a copy was provided to the patient. Our staff will reach out to him to coordinate a simulation appointment. 2. Stage IIA, pT3N0M0  adenocarcinoma of the sigmoid colon. He will follow up with Dr. Benay Spice for surveillance.  3. Hypermetabolic activity in the stomach. He will follow back up with Dr. Henrene Pastor and he can do this in the midst of radiotherapy planning or treatment. We will follow up with the work up expectantly.  In a visit lasting 60 minutes, greater than 50% of the time was spent face to face discussing his case, and coordinating the patient's care.  The above documentation reflects my direct findings during this shared patient visit. Please see the separate note by Dr. Lisbeth Renshaw on this date for the remainder of the patient's plan of care.    Carola Rhine, PAC

## 2018-07-24 NOTE — Telephone Encounter (Signed)
Patient daughter [name] calling stating pt is now cleared from his lung collapsing and has been home since 1.8.2020. [name] states that [facility]-lung cancer radiologist states pt can have procedure while doing radiation, but pt is on BT coumadin. Pt daughter wanting to know if he can reschedule procedure or what next steps are.

## 2018-07-25 ENCOUNTER — Telehealth: Payer: Self-pay | Admitting: General Practice

## 2018-07-25 NOTE — Telephone Encounter (Signed)
[facility] Psychosocial Distress Screening Clinical Social Work  Clinical Social Work was referred by distress screening protocol.  The patient scored a 7 on the Psychosocial Distress Thermometer which indicates moderate distress. Clinical Social Worker contacted patient by phone to assess for distress and other psychosocial needs. Family would like him to "relax and enjoy life" at this point in his life.  Per daughter, patient has significant satisfaction w his life, is "at peace."  Faith resources are important to patient and family.  Lives alone in house next to daughter, daughter looks in on him frequently, "we make sure that all his needs are met."  Family is providing healthy meals to patient at home, helps him get exercise, transports him as needed.    ONCBCN DISTRESS SCREENING 07/23/2018  Screening Type Initial Screening  Distress experienced in past week (1-10) 7  Practical problem type Work/school  Emotional problem type Nervousness/Anxiety  Physical Problem type Pain;Getting around;Breathing;Loss of appetitie;Constipation/diarrhea;Changes in urination;Tingling hands/feet;Skin dry/itchy;Swollen arms/legs    Clinical Social Worker follow up needed: No.  If yes, follow up plan:  Beverely Pace, [place], LCSW Clinical Social Worker Phone:  [phone]

## 2018-07-26 ENCOUNTER — Telehealth: Payer: Self-pay

## 2018-07-26 NOTE — Telephone Encounter (Signed)
Direct EGD in the [facility] is okay.  However, please confirm with his cardiologist that we may hold his Coumadin 3 to 5 days prior to the procedure.  Please document this correspondence in the medical record.  Thank you

## 2018-07-26 NOTE — Telephone Encounter (Signed)
Pts daughter is calling wanting to schedule pts EGD. Do you want pt seen in the office first or direct EGD? Please advise.

## 2018-07-26 NOTE — Telephone Encounter (Signed)
OK to hold Plavix. Gerald Stabs

## 2018-07-26 NOTE — Telephone Encounter (Signed)
Pt scheduled for EGD 08/08/18@2 :30pm. Awaiting anticoag clearance.

## 2018-07-26 NOTE — Telephone Encounter (Signed)
   Primary Cardiologist: Lauree Chandler, MD  Chart reviewed as part of pre-operative protocol coverage.   Per pharmacy recommendations, patient can hold coumadin 4 days prior to his upcoming endoscopy scheduled for 08/08/18. He can also hold his plavix for 5-7 days prior to his procedure. He should restart both coumadin and plavix as soon as he is cleared to do so by Dr. Henrene Pastor.   I will route this recommendation to the requesting party via Epic fax function and remove from pre-op pool.  Please call with questions.  Abigail Butts, PA-C 07/26/2018, 4:19 PM

## 2018-07-26 NOTE — Telephone Encounter (Signed)
Patient with diagnosis of afib on warfarin for anticoagulation.    Procedure: endoscopy Date of procedure: 08/08/18  CHADS2-VASc score of  5 (CHF, HTN, AGE, DM2, stroke/tia x 2, CAD, AGE, male)  Per office protocol, patient can hold warfarin for 4 days prior to procedure.

## 2018-07-26 NOTE — Telephone Encounter (Signed)
[facility] Pre-operative Risk Assessment     Request for surgical clearance:     Endoscopy Procedure  What type of surgery is being performed?     Endoscopy  When is this surgery scheduled?     08/08/18  What type of clearance is required ?   Pharmacy  Are there any medications that need to be held prior to surgery and how long? Plavix 5-7 and Coumadin 3-5 days prior to procedure  Practice name and name of physician performing surgery?      [facility] Dr. Scarlette Shorts  What is your office phone and fax number?      Phone- [phone]  [phone]  Anesthesia type (None, local, MAC, general) ?       MAC

## 2018-07-28 NOTE — Telephone Encounter (Signed)
Linda, Noted. Thanks

## 2018-07-29 NOTE — Telephone Encounter (Signed)
Per pharmacy recommendations, patient can hold coumadin 4 days prior to his upcoming endoscopy scheduled for 08/08/18. He can also hold his plavix for 5-7 days prior to his procedure. He should restart both coumadin and plavix as soon as he is cleared to do so by Dr. Henrene Pastor.   Left message for pts daughter to call back, need to get pt scheduled for previsit.

## 2018-07-29 NOTE — Telephone Encounter (Signed)
Pt scheduled for previsit 07/30/18@10 :30am, egd scheduled in the [place] 08/13/18@10am . Pts daughter aware of appt.

## 2018-07-30 ENCOUNTER — Ambulatory Visit (AMBULATORY_SURGERY_CENTER): Payer: Self-pay | Admitting: *Deleted

## 2018-07-30 ENCOUNTER — Encounter (HOSPITAL_COMMUNITY): Payer: Self-pay | Admitting: Oncology

## 2018-07-30 VITALS — Ht 71.0 in | Wt 170.0 lb

## 2018-07-30 DIAGNOSIS — R948 Abnormal results of function studies of other organs and systems: Secondary | ICD-10-CM

## 2018-07-30 NOTE — Progress Notes (Signed)
Daughter with patient during [place] today. Patient denies any allergies to eggs or soy. Patient denies any problems with anesthesia/sedation. Patient denies any oxygen use at home. Patient denies taking any diet/weight loss medications. patient aware to hold blood thinners as directed.

## 2018-07-31 ENCOUNTER — Ambulatory Visit
Admission: RE | Admit: 2018-07-31 | Discharge: 2018-07-31 | Disposition: A | Discharge status: Home / Self Care | Payer: Medicare Other | Source: Ambulatory Visit | Attending: Radiation Oncology | Admitting: Radiation Oncology

## 2018-07-31 DIAGNOSIS — C3412 Malignant neoplasm of upper lobe, left bronchus or lung: Secondary | ICD-10-CM | POA: Insufficient documentation

## 2018-07-31 DIAGNOSIS — Z510 Encounter for antineoplastic radiation therapy: Secondary | ICD-10-CM | POA: Diagnosis not present

## 2018-07-31 NOTE — Progress Notes (Signed)
[facility] Simulation and Treatment Planning Note   Name:  [name] MRN: [id number]   Date: 07/31/2018  DOB: Nov 08, 1929  Status:outpatient    DIAGNOSIS:    ICD-10-CM   1. Malignant neoplasm of bronchus of left upper lobe (HCC) C34.12      CONSENT VERIFIED:yes   SET UP: Patient is setup supine   IMMOBILIZATION: The patient was immobilized using a customized Vac Loc bag/ blue bag and customized accuform device   NARRATIVE:The patient was brought to the [place].  Identity was confirmed.  All relevant records and images related to the planned course of therapy were reviewed.  Then, the patient was positioned in a stable reproducible clinical set-up for radiation therapy. Abdominal compression was applied by me.  4D CT images were obtained and reproducible breathing pattern was confirmed. Free breathing CT images were obtained.  Skin markings were placed.  The CT images were loaded into the planning software where the target and avoidance structures were contoured.  The radiation prescription was entered and confirmed.    TREATMENT PLANNING NOTE:  Treatment planning then occurred. I have requested : IMRT planning.This treatment technique is medically necessary due to the high-dose of radiation delivered to the target region which is in close proximity to adjacent critical normal structures.  3 dimensional simulation is performed and dose volume histogram of the gross tumor volume, planning tumor volume and criticial normal structures including the spinal cord and lungs were analyzed and requested.  Special treatment procedure was performed due to high dose per fraction.  The patient will be monitored for increased risk of toxicity.  Daily imaging using cone beam CT will be used for target localization.  I anticipate that the patient will receive 60 Gy in 5 fractions to target volume. Further adjustments will be made based on  the planning process is necessary.  ------------------------------------------------  Jodelle Gross, MD, PhD

## 2018-08-01 DIAGNOSIS — C3412 Malignant neoplasm of upper lobe, left bronchus or lung: Secondary | ICD-10-CM | POA: Diagnosis not present

## 2018-08-01 DIAGNOSIS — Z510 Encounter for antineoplastic radiation therapy: Secondary | ICD-10-CM | POA: Diagnosis not present

## 2018-08-01 NOTE — Progress Notes (Signed)
  Radiation Oncology         [phone] ________________________________  Name: [name] MRN: [id number]  Date: 07/31/2018  DOB: 1930-02-26  RESPIRATORY MOTION MANAGEMENT SIMULATION  NARRATIVE:  In order to account for effect of respiratory motion on target structures and other organs in the planning and delivery of radiotherapy, this patient underwent respiratory motion management simulation.  To accomplish this, when the patient was brought to the CT simulation planning suite, 4D respiratoy motion management CT images were obtained.  The CT images were loaded into the planning software.  Then, using a variety of tools including Cine, MIP, and standard views, the target volume and planning target volumes (PTV) were delineated.  Avoidance structures were contoured.  Treatment planning then occurred.  Dose volume histograms were generated and reviewed for each of the requested structure.  The resulting plan was carefully reviewed and approved today.   ------------------------------------------------  Jodelle Gross, MD, PhD

## 2018-08-05 ENCOUNTER — Ambulatory Visit (INDEPENDENT_AMBULATORY_CARE_PROVIDER_SITE_OTHER): Payer: Medicare Other | Admitting: *Deleted

## 2018-08-05 DIAGNOSIS — Z7901 Long term (current) use of anticoagulants: Secondary | ICD-10-CM

## 2018-08-05 DIAGNOSIS — I480 Paroxysmal atrial fibrillation: Secondary | ICD-10-CM | POA: Diagnosis not present

## 2018-08-05 LAB — POCT INR: INR: 4.9 — AB (ref 2.0–3.0)

## 2018-08-05 NOTE — Patient Instructions (Addendum)
Last dose on 08/08/2018 for your EGD on 08/13/2018.    You will resume your Coumadin after your procedure per  Dr. Henrene Pastor. Once you resume you will take an extra 1/2 tablet for 2 days then resume normal dose (extra 1/2 total will be 7.5mg  which is 1.5 tablets).  Description   Refer to patient instructions regarding holding for procedure. Do not take any Coumadin today and No Coumadin tomorrow then continue taking 1 tablet daily except 1.5 tablets on Fridays. Recheck INR in 1 week after procedure.  Call with any questions new medications or if scheduled for any other  procedures [phone]

## 2018-08-07 ENCOUNTER — Telehealth: Payer: Self-pay | Admitting: Cardiovascular Disease

## 2018-08-07 NOTE — Telephone Encounter (Signed)
Agree. Thanks

## 2018-08-07 NOTE — Telephone Encounter (Signed)
I will route to a Triage nurse as Zenaida Deed, RN is off.

## 2018-08-07 NOTE — Telephone Encounter (Signed)
New Message    Patient returning [name] call.

## 2018-08-07 NOTE — Telephone Encounter (Signed)
New Message:    Patient daughter calling about her father who has a appt next week to have a operation. She's worried about her fathers heart and being put to sleep  Patient daughter would like for a nurse to call her. Pease call patient daughter.

## 2018-08-07 NOTE — Telephone Encounter (Signed)
Spoke with pt's daughter at great length re pt's upcoming procedure daughter has concerns where endoscopy is being done and if needs to be done at hospital or okay to do at Dr perry's office .Daughter informed this would be up to Dr Henrene Pastor where he would want to do procedure .Daughter is going to call Dr Blanch Media office .Adonis Housekeeper

## 2018-08-07 NOTE — Telephone Encounter (Signed)
LM to call back ./cy 

## 2018-08-08 ENCOUNTER — Encounter: Payer: Medicare Other | Admitting: Internal Medicine

## 2018-08-13 ENCOUNTER — Ambulatory Visit: Payer: Medicare Other | Admitting: Radiation Oncology

## 2018-08-13 ENCOUNTER — Encounter: Payer: Self-pay | Admitting: Internal Medicine

## 2018-08-13 ENCOUNTER — Ambulatory Visit (AMBULATORY_SURGERY_CENTER): Payer: Medicare Other | Admitting: Internal Medicine

## 2018-08-13 ENCOUNTER — Other Ambulatory Visit: Payer: Self-pay | Admitting: *Deleted

## 2018-08-13 VITALS — BP 117/58 | HR 53 | Temp 98.0°F | Resp 12

## 2018-08-13 DIAGNOSIS — I10 Essential (primary) hypertension: Secondary | ICD-10-CM | POA: Diagnosis not present

## 2018-08-13 DIAGNOSIS — C187 Malignant neoplasm of sigmoid colon: Secondary | ICD-10-CM

## 2018-08-13 DIAGNOSIS — R933 Abnormal findings on diagnostic imaging of other parts of digestive tract: Secondary | ICD-10-CM | POA: Diagnosis not present

## 2018-08-13 DIAGNOSIS — R948 Abnormal results of function studies of other organs and systems: Secondary | ICD-10-CM

## 2018-08-13 DIAGNOSIS — I252 Old myocardial infarction: Secondary | ICD-10-CM | POA: Diagnosis not present

## 2018-08-13 DIAGNOSIS — R935 Abnormal findings on diagnostic imaging of other abdominal regions, including retroperitoneum: Secondary | ICD-10-CM | POA: Diagnosis not present

## 2018-08-13 DIAGNOSIS — I739 Peripheral vascular disease, unspecified: Secondary | ICD-10-CM | POA: Diagnosis not present

## 2018-08-13 DIAGNOSIS — I4891 Unspecified atrial fibrillation: Secondary | ICD-10-CM | POA: Diagnosis not present

## 2018-08-13 DIAGNOSIS — I2510 Atherosclerotic heart disease of native coronary artery without angina pectoris: Secondary | ICD-10-CM | POA: Diagnosis not present

## 2018-08-13 DIAGNOSIS — K219 Gastro-esophageal reflux disease without esophagitis: Secondary | ICD-10-CM | POA: Diagnosis not present

## 2018-08-13 MED ORDER — SODIUM CHLORIDE 0.9 % IV SOLN: 500.0000 mL | Freq: Once | INTRAVENOUS | Status: DC

## 2018-08-13 NOTE — Progress Notes (Signed)
Pt's states no medical or surgical changes since previsit or office visit. 

## 2018-08-13 NOTE — Progress Notes (Signed)
Per Dr. Benay Spice: Schedule patient for CEA and office visit in May 2020. Scheduling message sent. [facility] report returned: No reportable alterations with companion diagnostic claims.

## 2018-08-13 NOTE — Progress Notes (Signed)
Report to PACU, RN, vss, BBS= Clear.  

## 2018-08-13 NOTE — Patient Instructions (Signed)
  Continue present medications. Resume Plavix and Coumadin today.     YOU HAD AN ENDOSCOPIC PROCEDURE TODAY AT [facility]:   Refer to the procedure report that was given to you for any specific questions about what was found during the examination.  If the procedure report does not answer your questions, please call your gastroenterologist to clarify.  If you requested that your care partner not be given the details of your procedure findings, then the procedure report has been included in a sealed envelope for you to review at your convenience later.  YOU SHOULD EXPECT: Some feelings of bloating in the abdomen. Passage of more gas than usual.  Walking can help get rid of the air that was put into your GI tract during the procedure and reduce the bloating. If you had a lower endoscopy (such as a colonoscopy or flexible sigmoidoscopy) you may notice spotting of blood in your stool or on the toilet paper. If you underwent a bowel prep for your procedure, you may not have a normal bowel movement for a few days.  Please Note:  You might notice some irritation and congestion in your nose or some drainage.  This is from the oxygen used during your procedure.  There is no need for concern and it should clear up in a day or so.  SYMPTOMS TO REPORT IMMEDIATELY:     Following upper endoscopy (EGD)  Vomiting of blood or coffee ground material  New chest pain or pain under the shoulder blades  Painful or persistently difficult swallowing  New shortness of breath  Fever of 100F or higher  Black, tarry-looking stools  For urgent or emergent issues, a gastroenterologist can be reached at any hour by calling [phone].   DIET:  We do recommend a small meal at first, but then you may proceed to your regular diet.  Drink plenty of fluids but you should avoid alcoholic beverages for 24 hours.  ACTIVITY:  You should plan to take it easy for the rest of today and you should NOT DRIVE  or use heavy machinery until tomorrow (because of the sedation medicines used during the test).    FOLLOW UP: Our staff will call the number listed on your records the next business day following your procedure to check on you and address any questions or concerns that you may have regarding the information given to you following your procedure. If we do not reach you, we will leave a message.  However, if you are feeling well and you are not experiencing any problems, there is no need to return our call.  We will assume that you have returned to your regular daily activities without incident.  If any biopsies were taken you will be contacted by phone or by letter within the next 1-3 weeks.  Please call us at [phone] if you have not heard about the biopsies in 3 weeks.    SIGNATURES/CONFIDENTIALITY: You and/or your care partner have signed paperwork which will be entered into your electronic medical record.  These signatures attest to the fact that that the information above on your After Visit Summary has been reviewed and is understood.  Full responsibility of the confidentiality of this discharge information lies with you and/or your care-partner.

## 2018-08-13 NOTE — Op Note (Signed)
[facility] Patient Name: [name] Procedure Date: 08/13/2018 9:54 AM MRN: [id number] Endoscopist: Docia Chuck. Henrene Pastor , MD Age: 85 Referring MD:  Date of Birth: 10-25-1929 Gender: Male Account #: [id number] Procedure:                Upper GI endoscopy Indications:              Abnormal CT of the GI tract, Abnormal PET scan of                            the GI tract. History of colon cancer and lung                            cancer Medicines:                Monitored Anesthesia Care Procedure:                Pre-Anesthesia Assessment:                           - Prior to the procedure, a History and Physical                            was performed, and patient medications and                            allergies were reviewed. The patient's tolerance of                            previous anesthesia was also reviewed. The risks                            and benefits of the procedure and the sedation                            options and risks were discussed with the patient.                            All questions were answered, and informed consent                            was obtained. Prior Anticoagulants: The patient has                            taken Plavix (clopidogrel), last dose was 5 days                            prior to procedure. Patient has taken Coumadin,                            last dose 4 days prior to procedure. ASA Grade                            Assessment: III - A patient with severe systemic  disease. After reviewing the risks and benefits,                            the patient was deemed in satisfactory condition to                            undergo the procedure.                           After obtaining informed consent, the endoscope was                            passed under direct vision. Throughout the                            procedure, the patient's blood pressure, pulse, and   oxygen saturations were monitored continuously. The                            Endoscope was introduced through the mouth, and                            advanced to the second part of duodenum. The upper                            GI endoscopy was accomplished without difficulty.                            The patient tolerated the procedure well. Scope In: Scope Out: Findings:                 The esophagus was normal.                           The stomach was normal.                           The examined duodenum was normal.                           The cardia and gastric fundus were normal on                            retroflexion. Complications:            No immediate complications. Estimated Blood Loss:     Estimated blood loss: none. Impression:               - Normal esophagus.                           - Normal stomach.                           - Normal examined duodenum.                           - No specimens collected. Recommendation:           -  Patient has a contact number available for                            emergencies. The signs and symptoms of potential                            delayed complications were discussed with the                            patient. Return to normal activities tomorrow.                            Written discharge instructions were provided to the                            patient.                           - Resume previous diet.                           - Continue present medications.                           - Resume Plavix and Coumadin today [name] N. Henrene Pastor, MD 08/13/2018 10:57:42 AM This report has been signed electronically.

## 2018-08-14 ENCOUNTER — Ambulatory Visit
Admission: RE | Admit: 2018-08-14 | Discharge: 2018-08-14 | Disposition: A | Discharge status: Home / Self Care | Payer: Medicare Other | Source: Ambulatory Visit | Attending: Radiation Oncology | Admitting: Radiation Oncology

## 2018-08-14 ENCOUNTER — Telehealth: Payer: Self-pay | Admitting: Oncology

## 2018-08-14 ENCOUNTER — Ambulatory Visit: Payer: Medicare Other | Admitting: Radiation Oncology

## 2018-08-14 ENCOUNTER — Telehealth: Payer: Self-pay | Admitting: *Deleted

## 2018-08-14 DIAGNOSIS — C3412 Malignant neoplasm of upper lobe, left bronchus or lung: Secondary | ICD-10-CM | POA: Insufficient documentation

## 2018-08-14 DIAGNOSIS — Z510 Encounter for antineoplastic radiation therapy: Secondary | ICD-10-CM | POA: Diagnosis not present

## 2018-08-14 NOTE — Telephone Encounter (Signed)
  Follow up Call-  Call back number 08/13/2018  Post procedure Call Back phone  # [phone]  Permission to leave phone message Yes  Some recent data might be hidden     Patient questions:  Do you have a fever, pain , or abdominal swelling? No. Pain Score  0 *  Have you tolerated food without any problems? Yes.    Have you been able to return to your normal activities? Yes.    Do you have any questions about your discharge instructions: Diet   No. Medications  No. Follow up visit  No.  Do you have questions or concerns about your Care? No.  Actions: * If pain score is 4 or above: No action needed, pain <4.

## 2018-08-14 NOTE — Telephone Encounter (Signed)
Scheduled appt per 2/4 sch message - daughter is aware of appts.

## 2018-08-15 ENCOUNTER — Ambulatory Visit: Payer: Medicare Other | Admitting: Radiation Oncology

## 2018-08-16 ENCOUNTER — Ambulatory Visit
Admission: RE | Admit: 2018-08-16 | Discharge: 2018-08-16 | Disposition: A | Discharge status: Home / Self Care | Payer: Medicare Other | Source: Ambulatory Visit | Attending: Radiation Oncology | Admitting: Radiation Oncology

## 2018-08-16 ENCOUNTER — Ambulatory Visit: Payer: Medicare Other | Admitting: Radiation Oncology

## 2018-08-16 DIAGNOSIS — Z510 Encounter for antineoplastic radiation therapy: Secondary | ICD-10-CM | POA: Diagnosis not present

## 2018-08-16 DIAGNOSIS — C3412 Malignant neoplasm of upper lobe, left bronchus or lung: Secondary | ICD-10-CM | POA: Diagnosis not present

## 2018-08-19 ENCOUNTER — Ambulatory Visit
Admission: RE | Admit: 2018-08-19 | Discharge: 2018-08-19 | Disposition: A | Discharge status: Home / Self Care | Payer: Medicare Other | Source: Ambulatory Visit | Attending: Radiation Oncology | Admitting: Radiation Oncology

## 2018-08-19 DIAGNOSIS — C3412 Malignant neoplasm of upper lobe, left bronchus or lung: Secondary | ICD-10-CM | POA: Diagnosis not present

## 2018-08-19 DIAGNOSIS — Z510 Encounter for antineoplastic radiation therapy: Secondary | ICD-10-CM | POA: Diagnosis not present

## 2018-08-20 ENCOUNTER — Ambulatory Visit (INDEPENDENT_AMBULATORY_CARE_PROVIDER_SITE_OTHER): Payer: Medicare Other | Admitting: *Deleted

## 2018-08-20 DIAGNOSIS — I480 Paroxysmal atrial fibrillation: Secondary | ICD-10-CM

## 2018-08-20 DIAGNOSIS — Z7901 Long term (current) use of anticoagulants: Secondary | ICD-10-CM

## 2018-08-20 LAB — POCT INR: INR: 1.8 — AB (ref 2.0–3.0)

## 2018-08-20 NOTE — Patient Instructions (Addendum)
Description   Today take 1.5 tablets then continue taking 1 tablet daily except 1.5 tablets on Fridays. Recheck INR in 2 weeks. Call with any questions new medications or if scheduled for any other  procedures [phone]

## 2018-08-21 ENCOUNTER — Ambulatory Visit
Admission: RE | Admit: 2018-08-21 | Discharge: 2018-08-21 | Disposition: A | Discharge status: Home / Self Care | Payer: Medicare Other | Source: Ambulatory Visit | Attending: Radiation Oncology | Admitting: Radiation Oncology

## 2018-08-21 ENCOUNTER — Encounter (HOSPITAL_COMMUNITY): Payer: Self-pay | Admitting: Oncology

## 2018-08-21 DIAGNOSIS — Z510 Encounter for antineoplastic radiation therapy: Secondary | ICD-10-CM | POA: Diagnosis not present

## 2018-08-21 DIAGNOSIS — C3412 Malignant neoplasm of upper lobe, left bronchus or lung: Secondary | ICD-10-CM | POA: Diagnosis not present

## 2018-08-22 ENCOUNTER — Encounter: Payer: Self-pay | Admitting: Cardiovascular Disease

## 2018-08-23 ENCOUNTER — Ambulatory Visit
Admission: RE | Admit: 2018-08-23 | Discharge: 2018-08-23 | Disposition: A | Discharge status: Home / Self Care | Payer: Medicare Other | Source: Ambulatory Visit | Attending: Radiation Oncology | Admitting: Radiation Oncology

## 2018-08-23 ENCOUNTER — Encounter: Payer: Self-pay | Admitting: Radiation Oncology

## 2018-08-23 DIAGNOSIS — Z510 Encounter for antineoplastic radiation therapy: Secondary | ICD-10-CM | POA: Diagnosis not present

## 2018-08-23 DIAGNOSIS — C3412 Malignant neoplasm of upper lobe, left bronchus or lung: Secondary | ICD-10-CM | POA: Diagnosis not present

## 2018-08-30 DIAGNOSIS — E538 Deficiency of other specified B group vitamins: Secondary | ICD-10-CM | POA: Diagnosis not present

## 2018-08-30 DIAGNOSIS — E7849 Other hyperlipidemia: Secondary | ICD-10-CM | POA: Diagnosis not present

## 2018-08-30 DIAGNOSIS — I10 Essential (primary) hypertension: Secondary | ICD-10-CM | POA: Diagnosis not present

## 2018-08-30 DIAGNOSIS — R7301 Impaired fasting glucose: Secondary | ICD-10-CM | POA: Diagnosis not present

## 2018-08-30 DIAGNOSIS — R82998 Other abnormal findings in urine: Secondary | ICD-10-CM | POA: Diagnosis not present

## 2018-09-04 ENCOUNTER — Ambulatory Visit (INDEPENDENT_AMBULATORY_CARE_PROVIDER_SITE_OTHER): Payer: Medicare Other | Admitting: *Deleted

## 2018-09-04 DIAGNOSIS — I480 Paroxysmal atrial fibrillation: Secondary | ICD-10-CM | POA: Diagnosis not present

## 2018-09-04 DIAGNOSIS — Z7901 Long term (current) use of anticoagulants: Secondary | ICD-10-CM

## 2018-09-04 LAB — POCT INR: INR: 4 — AB (ref 2.0–3.0)

## 2018-09-04 NOTE — Patient Instructions (Addendum)
Description   Do not take any Coumadin today then continue taking 1 tablet daily except 1.5 tablets on Fridays. Recheck INR in 1 week with Md appt. Call with any questions new medications or if scheduled for any other  procedures [phone]

## 2018-09-09 ENCOUNTER — Ambulatory Visit (INDEPENDENT_AMBULATORY_CARE_PROVIDER_SITE_OTHER): Payer: Medicare Other | Admitting: Cardiovascular Disease

## 2018-09-09 ENCOUNTER — Ambulatory Visit (INDEPENDENT_AMBULATORY_CARE_PROVIDER_SITE_OTHER): Payer: Medicare Other | Admitting: *Deleted

## 2018-09-09 ENCOUNTER — Encounter: Payer: Self-pay | Admitting: Cardiovascular Disease

## 2018-09-09 VITALS — BP 140/58 | HR 45 | Ht 71.0 in | Wt 169.8 lb

## 2018-09-09 DIAGNOSIS — I255 Ischemic cardiomyopathy: Secondary | ICD-10-CM

## 2018-09-09 DIAGNOSIS — I480 Paroxysmal atrial fibrillation: Secondary | ICD-10-CM | POA: Diagnosis not present

## 2018-09-09 DIAGNOSIS — Z7901 Long term (current) use of anticoagulants: Secondary | ICD-10-CM | POA: Diagnosis not present

## 2018-09-09 DIAGNOSIS — I10 Essential (primary) hypertension: Secondary | ICD-10-CM

## 2018-09-09 DIAGNOSIS — Z1212 Encounter for screening for malignant neoplasm of rectum: Secondary | ICD-10-CM | POA: Diagnosis not present

## 2018-09-09 DIAGNOSIS — I2510 Atherosclerotic heart disease of native coronary artery without angina pectoris: Secondary | ICD-10-CM | POA: Diagnosis not present

## 2018-09-09 LAB — POCT INR: INR: 2.3 (ref 2.0–3.0)

## 2018-09-09 MED ORDER — ASPIRIN EC 81 MG PO TBEC: 81.0000 mg | DELAYED_RELEASE_TABLET | Freq: Every day | ORAL | 3 refills | Status: AC

## 2018-09-09 NOTE — Patient Instructions (Signed)
Description   Continue taking 1 tablet daily except 1.5 tablets on Fridays. Recheck INR in 2 weeks. Call with any questions new medications or if scheduled for any other  procedures [phone]

## 2018-09-09 NOTE — Patient Instructions (Addendum)
Medication Instructions:  Your physician has recommended you make the following change in your medication: Stop clopidogrel. Start aspirin 81 mg by mouth daily.   If you need a refill on your cardiac medications before your next appointment, please call your pharmacy.   Lab work: none If you have labs (blood work) drawn today and your tests are completely normal, you will receive your results only by: Marland Kitchen MyChart Message (if you have MyChart) OR . A paper copy in the mail If you have any lab test that is abnormal or we need to change your treatment, we will call you to review the results.  Testing/Procedures: none  Follow-Up: At [facility], you and your health needs are our priority.  As part of our continuing mission to provide you with exceptional heart care, we have created designated Provider Care Teams.  These Care Teams include your primary Cardiologist (physician) and Advanced Practice Providers (APPs -  Physician Assistants and Nurse Practitioners) who all work together to provide you with the care you need, when you need it. You will need a follow up appointment in 6 months.  Please call our office 2 months in advance to schedule this appointment.  You may see Lauree Chandler, MD or one of the following Advanced Practice Providers on your designated Care Team:   Dale, PA-C Melina Copa, PA-C . Ermalinda Barrios, PA-C  Any Other Special Instructions Will Be Listed Below (If Applicable).

## 2018-09-09 NOTE — Progress Notes (Signed)
Chief Complaint  Patient presents with  . Follow-up    CAD     History of Present Illness: 85 yo male with history of atrial fibrillation, ischemic cardiomyopathy, CAD, HTN, PVCs and colon cancer who is here today for follow up. First cardiac stents placed in the Circumflex and LAD in 2007. The stents were Taxus drug eluting stents. LV function was normal at that time. I saw him in July 2018 and he had c/o leg weakness, dizziness. He had one fall due to sudden onset of weakness in his left leg. 48 hour cardiac monitor showed sinus bradycardia with PVCs, PACs and a short run of a wide complex tachycardia. Echo 01/25/17 with normal LV systolic function, no significant valve disease. He was admitted to [facility] 08/08/17 with a NSTEMI and was found to have severe disease in the LAD and Circumflex, both treated with drug eluting stents. (2 drug eluting stents placed in the Circumflex and one drug eluting stent placed in the LAD). He was also found to have atrial fibrillation and was started on coumadin. He did not wish to start Eliquis due to lack of medication coverage with his insurance.  Echo February 2019 with LVEF=40% but repeat echo September 2019 with LVEF=60%, no significant valve disease. He had rectal bleeding and was found to have a mass in the sigmoid colon that turned out to be adenocarcinoma. He had a laparoscopic sigmoid colectomy in June 2019. He is being followed by Dr. Benay Spice in the Oncology office. Admitted to [facility] January 2020 after lung biopsy on left upper lobe nodule resulted in a pneumothorax requiring a chest tube placement.   He is here today for follow up. The patient denies any chest pain, dyspnea, palpitations, lower extremity edema, orthopnea, PND, dizziness, near syncope or syncope. He is feeling great.      Primary Care Physician: Marton Redwood, MD  Past Medical History:  Diagnosis Date  . A-fib ([place])   . Arthritis   . BPH (benign prostatic hyperplasia)   . Cancer [facility])     colon cancer   . Chest pain, unspecified   . Chronic kidney disease   . Coronary atherosclerosis of unspecified type of vessel, native or graft   . Dizziness and giddiness   . Hyperlipidemia   . Hypertension   . Lung cancer ([place])    bx's done 08/09/2017  . Myocardial infarction ([place]) 12 years ago   x2  . Non-ST elevation (NSTEMI) myocardial infarction ([place]) 08/2017   dx afib,replaced stent  . Peripheral neuropathy   . PVD (peripheral vascular disease) ([place])   . Shortness of breath    exertion  . Vitamin B12 deficiency     Past Surgical History:  Procedure Laterality Date  . COLON SURGERY     partial colectomy Dr. Marcello Moores 12-14-17  . CORONARY ANGIOPLASTY     4 stents  . CORONARY STENT INTERVENTION N/A 08/09/2017   Procedure: CORONARY STENT INTERVENTION;  Surgeon: Nelva Bush, MD;  Location: [place] CV LAB;  Service: Cardiovascular;  Laterality: N/A;  . FLEXIBLE SIGMOIDOSCOPY N/A 11/12/2017   Procedure: FLEXIBLE SIGMOIDOSCOPY;  Surgeon: Irene Shipper, MD;  Location: [facility] ENDOSCOPY;  Service: Endoscopy;  Laterality: N/A;  . HERNIA REPAIR     right inguinal  . INGUINAL HERNIA REPAIR  06/14/2012   Procedure: HERNIA REPAIR INGUINAL ADULT;  Surgeon: Odis Hollingshead, MD;  Location: [place];  Service: General;  Laterality: Right;  . INSERTION OF MESH  06/14/2012   Procedure:  INSERTION OF MESH;  Surgeon: Odis Hollingshead, MD;  Location: [place];  Service: General;  Laterality: Right;  . LAPAROSCOPIC PARTIAL COLECTOMY N/A 12/26/2017   Procedure: LAPAROSCOPIC PARTIAL COLECTOMY ERAS PATHWAY;  Surgeon: Leighton Ruff, MD;  Location: [facility] ORS;  Service: General;  Laterality: N/A;  . LEFT HEART CATH AND CORONARY ANGIOGRAPHY N/A 08/09/2017   Procedure: LEFT HEART CATH AND CORONARY ANGIOGRAPHY;  Surgeon: Nelva Bush, MD;  Location: [place] CV LAB;  Service: Cardiovascular;  Laterality: N/A;  . PROCTOSCOPY N/A 12/26/2017   Procedure: PROCTOSCOPY;  Surgeon: Leighton Ruff, MD;  Location: [facility]  ORS;  Service: General;  Laterality: N/A;  . SINUS SURGERY WITH INSTATRAK  1985    Current Outpatient Medications  Medication Sig Dispense Refill  . atorvastatin (LIPITOR) 40 MG tablet TAKE ONE TABLET DAILY AT 6PM 90 tablet 2  . lisinopril (PRINIVIL,ZESTRIL) 5 MG tablet Take 0.5 tablets (2.5 mg total) by mouth daily. 45 tablet   . metoprolol succinate (TOPROL-XL) 25 MG 24 hr tablet Take 3 tablets (75 mg total) by mouth daily. 90 tablet 9  . pantoprazole (PROTONIX) 40 MG tablet Take 1 tablet (40 mg total) by mouth daily. 30 tablet 2  . vitamin B-12 (CYANOCOBALAMIN) 1000 MCG tablet Take 1,000 mcg by mouth daily.    Marland Kitchen warfarin (COUMADIN) 5 MG tablet TAKE AS DIRECTED BY COUMADIN CLINIC (Patient taking differently: Take 5-7.5 mg by mouth See admin instructions. Take as directed by coumadin clinic; 7.5 mg (5 mg x 1.5) every Fri; 5 mg (5 mg x 1) all other days) 35 tablet 2  . aspirin EC 81 MG tablet Take 1 tablet (81 mg total) by mouth daily. 90 tablet 3   No current facility-administered medications for this visit.     Allergies  Allergen Reactions  . Gadolinium Derivatives Hives, Itching and Other (See Comments)    Dr. Jeralyn Ruths s/w Mr. Swanger. We observed him for 15 minutes. He did not need to take Benadryl. The symptoms began to subside before he left.     Social History   Socioeconomic History  . Marital status: Divorced    Spouse name: Not on file  . Number of children: 3  . Years of education: Not on file  . Highest education level: Not on file  Occupational History  . Occupation: Magazine features editor    Comment: [employer]  Social Needs  . Financial resource strain: Not on file  . Food insecurity:    Worry: Not on file    Inability: Not on file  . Transportation needs:    Medical: No    Non-medical: No  Tobacco Use  . Smoking status: Former Smoker    Last attempt to quit: 07/10/1981    Years since quitting: 37.1  . Smokeless tobacco: Never Used  Substance and  Sexual Activity  . Alcohol use: No  . Drug use: No  . Sexual activity: Not on file  Lifestyle  . Physical activity:    Days per week: Not on file    Minutes per session: Not on file  . Stress: Not on file  Relationships  . Social connections:    Talks on phone: Not on file    Gets together: Not on file    Attends religious service: Not on file    Active member of club or organization: Not on file    Attends meetings of clubs or organizations: Not on file    Relationship status: Not on file  . Intimate partner  violence:    Fear of current or ex partner: Not on file    Emotionally abused: Not on file    Physically abused: Not on file    Forced sexual activity: Not on file  Other Topics Concern  . Not on file  Social History Narrative   Regular exercise          Family History  Problem Relation Age of Onset  . Heart attack Father 26  . Arthritis Sister   . Hypothyroidism Daughter   . Colon cancer Neg Hx   . Esophageal cancer Neg Hx   . Rectal cancer Neg Hx   . Stomach cancer Neg Hx     Review of Systems:  As stated in the HPI and otherwise negative.   BP (!) 140/58   Pulse (!) 45   Ht 5\' 11"  (1.803 m)   Wt 77 kg   SpO2 (!) 85%   BMI 23.68 kg/m   Physical Examination:  General: Well developed, well nourished, NAD  HEENT: OP clear, mucus membranes moist  SKIN: warm, dry. No rashes. Neuro: No focal deficits  Musculoskeletal: Muscle strength 5/5 all ext  Psychiatric: Mood and affect normal  Neck: No JVD, no carotid bruits, no thyromegaly, no lymphadenopathy.  Lungs:Clear bilaterally, no wheezes, rhonci, crackles Cardiovascular: Regular rate and rhythm. No murmurs, gallops or rubs. Abdomen:Soft. Bowel sounds present. Non-tender.  Extremities: Trace bilateral lower extremity edema. Pulses are 2 + in the bilateral DP/PT.  Echo September 2019: - Left ventricle: The cavity size was normal. Wall thickness was   normal. Systolic function was normal. The estimated  ejection   fraction was in the range of 60% to 65%. Wall motion was normal;   there were no regional wall motion abnormalities. Features are   consistent with a pseudonormal left ventricular filling pattern,   with concomitant abnormal relaxation and increased filling   pressure (grade 2 diastolic dysfunction). - Aortic valve: There was trivial regurgitation.  Cardiac cath 08/09/17: 1. Severe 2-vessel coronary artery disease, including 80% in-stent restenosis of the mid LAD with disease extending beyond the distal stent edge, as well as 70% ostial/proximal LCx disease followed by multifocal mid and distal LCx in-stent restenosis of up to 99%. 2. Moderate, non-obstructive RCA disease. 3. Normal left ventricular filling pressure. 4. Successful PCI to mid LAD with placement of Resolute Onyx 2.75 x 34 mm drug eluting stent (post-dilated to 3.1 mm) with 0% residual stenosis and TIMI-3 flow. 5. Successful PCI to LCx with placement of Resolute Onyx drug-eluting stents extending from the ostium to the proximal segment of old stent (2.5 x 18 mm) and covering the distal 1/3 of old stent extending into the distal LCx (2.0 x 30 mm) with 0% residual stenosis and TIMI-3 flow.  EKG:  EKG is not ordered today. The ekg ordered today demonstrates   Recent Labs: 07/13/2018: Hemoglobin 12.9; Platelets 235 07/14/2018: Magnesium 1.8 07/16/2018: ALT 19; BUN 27; Creatinine, Ser 1.34; Potassium 3.9; Sodium 137   Lipid Panel    Component Value Date/Time   CHOL 160 08/09/2017 0359   TRIG 74 08/09/2017 0359   HDL 41 08/09/2017 0359   CHOLHDL 3.9 08/09/2017 0359   VLDL 15 08/09/2017 0359   LDLCALC 104 (H) 08/09/2017 0359     Wt Readings from Last 3 Encounters:  09/09/18 77 kg  07/30/18 77.1 kg  07/23/18 76.2 kg     Other studies Reviewed: Additional studies/ records that were reviewed today include: . Review of the above  records demonstrates:    Assessment and Plan:   1. CAD without angina: No chest  pain. Will stop Plavix and start ASA 81 mg daily. Will continue statin and beta blocker.   2. Atrial fibrillation, paroxysmal: He is in sinus today. Continue beta blocker and coumadin. He is going to the coumadin clinic today  3. HTN: BP controlled. No changes  4. Ischemic cardiomyopathy: LVEF=60% by echo September 2019. Continue Ace-inh and beta blocker.    5. Colon cancer/Lung cancer: He is s/p sigmoid colectomy. He is currently undergoing XRT for his lung cancer.   Current medicines are reviewed at length with the patient today.  The patient does not have concerns regarding medicines.  The following changes have been made:  no change  Labs/ tests ordered today include:   No orders of the defined types were placed in this encounter.   Disposition:   FU with me in 6  months  Signed, Lauree Chandler, MD 09/09/2018 9:45 AM    [facility] [address], [place], [place]  [address] Phone: [phone]; Fax: [phone]

## 2018-09-10 DIAGNOSIS — I10 Essential (primary) hypertension: Secondary | ICD-10-CM | POA: Diagnosis not present

## 2018-09-10 DIAGNOSIS — E7849 Other hyperlipidemia: Secondary | ICD-10-CM | POA: Diagnosis not present

## 2018-09-10 DIAGNOSIS — I255 Ischemic cardiomyopathy: Secondary | ICD-10-CM | POA: Diagnosis not present

## 2018-09-10 DIAGNOSIS — Z1339 Encounter for screening examination for other mental health and behavioral disorders: Secondary | ICD-10-CM | POA: Diagnosis not present

## 2018-09-10 DIAGNOSIS — R7301 Impaired fasting glucose: Secondary | ICD-10-CM | POA: Diagnosis not present

## 2018-09-10 DIAGNOSIS — Z6823 Body mass index (BMI) 23.0-23.9, adult: Secondary | ICD-10-CM | POA: Diagnosis not present

## 2018-09-10 DIAGNOSIS — Z85038 Personal history of other malignant neoplasm of large intestine: Secondary | ICD-10-CM | POA: Diagnosis not present

## 2018-09-10 DIAGNOSIS — Z1331 Encounter for screening for depression: Secondary | ICD-10-CM | POA: Diagnosis not present

## 2018-09-10 DIAGNOSIS — Z7901 Long term (current) use of anticoagulants: Secondary | ICD-10-CM | POA: Diagnosis not present

## 2018-09-10 DIAGNOSIS — Z9861 Coronary angioplasty status: Secondary | ICD-10-CM | POA: Diagnosis not present

## 2018-09-10 DIAGNOSIS — I252 Old myocardial infarction: Secondary | ICD-10-CM | POA: Diagnosis not present

## 2018-09-10 DIAGNOSIS — I480 Paroxysmal atrial fibrillation: Secondary | ICD-10-CM | POA: Diagnosis not present

## 2018-09-10 DIAGNOSIS — Z0000 Encounter for general adult medical examination without abnormal findings: Secondary | ICD-10-CM | POA: Diagnosis not present

## 2018-09-10 DIAGNOSIS — C3412 Malignant neoplasm of upper lobe, left bronchus or lung: Secondary | ICD-10-CM | POA: Diagnosis not present

## 2018-09-16 ENCOUNTER — Other Ambulatory Visit: Payer: Self-pay | Admitting: Cardiovascular Disease

## 2018-09-23 DIAGNOSIS — L821 Other seborrheic keratosis: Secondary | ICD-10-CM | POA: Diagnosis not present

## 2018-09-23 DIAGNOSIS — L218 Other seborrheic dermatitis: Secondary | ICD-10-CM | POA: Diagnosis not present

## 2018-09-23 DIAGNOSIS — Z85828 Personal history of other malignant neoplasm of skin: Secondary | ICD-10-CM | POA: Diagnosis not present

## 2018-09-23 DIAGNOSIS — D1801 Hemangioma of skin and subcutaneous tissue: Secondary | ICD-10-CM | POA: Diagnosis not present

## 2018-09-23 DIAGNOSIS — L570 Actinic keratosis: Secondary | ICD-10-CM | POA: Diagnosis not present

## 2018-09-24 ENCOUNTER — Ambulatory Visit (INDEPENDENT_AMBULATORY_CARE_PROVIDER_SITE_OTHER): Payer: Medicare Other | Admitting: Pharmacist

## 2018-09-24 ENCOUNTER — Telehealth: Payer: Self-pay | Admitting: Radiation Oncology

## 2018-09-24 ENCOUNTER — Other Ambulatory Visit: Payer: Self-pay

## 2018-09-24 DIAGNOSIS — Z7901 Long term (current) use of anticoagulants: Secondary | ICD-10-CM

## 2018-09-24 DIAGNOSIS — I480 Paroxysmal atrial fibrillation: Secondary | ICD-10-CM | POA: Diagnosis not present

## 2018-09-24 DIAGNOSIS — C3412 Malignant neoplasm of upper lobe, left bronchus or lung: Secondary | ICD-10-CM

## 2018-09-24 LAB — POCT INR: INR: 3.9 — AB (ref 2.0–3.0)

## 2018-09-24 NOTE — Telephone Encounter (Signed)
The patient's daughter called back and let us know that her dad has been doing well. She reports he has not had any symptoms of concern. We discussed cancelling the appt for Thursday due to the coronavirus concerns and that we would order a CT of the chest to assess his response to treatment. She is in agreement and will share this with her dad.

## 2018-09-24 NOTE — Telephone Encounter (Signed)
I tried reaching the patient but his line was busy. I called his daughter to let her know that given the coronavirus pandemic we're cancelling nonurgent medical appointments. I left her a message to call us back to discuss his appointments and plans for follow up.

## 2018-09-24 NOTE — Patient Instructions (Addendum)
Description   Do not take any more warfarin. Start taking Eliquis twice daily - first dose Thursday 3/19 in the morning. Space apart your doses every 12 hours. Please bring in a copy of your W2 or social security pay stub so that we can help with patient assistance. We will recheck labs in 2 months on Monday May 18th - come in any time after 7:30am.

## 2018-09-25 ENCOUNTER — Telehealth: Payer: Self-pay

## 2018-09-25 LAB — BASIC METABOLIC PANEL
BUN/Creatinine Ratio: 20 (ref 10–24)
BUN: 27 mg/dL (ref 8–27)
CO2: 26 mmol/L (ref 20–29)
CREATININE: 1.38 mg/dL — AB (ref 0.76–1.27)
Calcium: 8.9 mg/dL (ref 8.6–10.2)
Chloride: 102 mmol/L (ref 96–106)
GFR calc Af Amer: 53 mL/min/{1.73_m2} — ABNORMAL LOW (ref >59)
GFR calc non Af Amer: 46 mL/min/{1.73_m2} — ABNORMAL LOW (ref >59)
Glucose: 134 mg/dL — ABNORMAL HIGH (ref 65–99)
Potassium: 4.7 mmol/L (ref 3.5–5.2)
Sodium: 142 mmol/L (ref 134–144)

## 2018-09-25 LAB — CBC
Hematocrit: 39.2 % (ref 37.5–51.0)
Hemoglobin: 13.1 g/dL (ref 13.0–17.7)
MCH: 28.9 pg (ref 26.6–33.0)
MCHC: 33.4 g/dL (ref 31.5–35.7)
MCV: 86 fL (ref 79–97)
Platelets: 221 10*3/uL (ref 150–450)
RBC: 4.54 x10E6/uL (ref 4.14–5.80)
RDW: 13.9 % (ref 11.6–15.4)
WBC: 6.2 10*3/uL (ref 3.4–10.8)

## 2018-09-25 MED ORDER — APIXABAN 5 MG PO TABS: 5.0000 mg | ORAL_TABLET | Freq: Two times a day (BID) | ORAL | 0 refills | Status: AC

## 2018-09-25 NOTE — Telephone Encounter (Signed)
**Note De-Identified Quinnten Calvin Obfuscation** Dr Angelena Form has signed the application and I have faxed it to [employer] pt asst foundation.

## 2018-09-25 NOTE — Telephone Encounter (Signed)
**Note De-Identified [name] Obfuscation** The pt returned his [employer] pt asst application for Eliquis. I have completed the provider part of the application and gave it to Dr Alyse Low nurse awaiting his signature.

## 2018-09-25 NOTE — Addendum Note (Signed)
Addended by: Kourtnee Lahey E on: 09/25/2018 09:21 AM   Modules accepted: Orders

## 2018-09-26 ENCOUNTER — Ambulatory Visit: Payer: Self-pay | Admitting: Radiation Oncology

## 2018-09-26 DIAGNOSIS — Z23 Encounter for immunization: Secondary | ICD-10-CM | POA: Diagnosis not present

## 2018-09-26 DIAGNOSIS — Z13820 Encounter for screening for osteoporosis: Secondary | ICD-10-CM | POA: Diagnosis not present

## 2018-09-26 NOTE — Progress Notes (Signed)
  Radiation Oncology         [phone] ________________________________  Name: [name] MRN: [id number]  Date: 08/23/2018  DOB: 11/20/1929  End of Treatment Note  Diagnosis:   85 y.o. male with Stage IA3, cT1cN0M0, NSCLC, adenocarcinoma of the LUL of the lung    Indication for treatment:  Curative       Radiation treatment dates:   08/14/2018, 08/16/2018, 08/19/2018, 08/21/2018, 08/23/2018  Site/dose:   The tumor in the LUL was treated with a course of stereotactic body radiation treatment. The patient received 60 Gy in 5 fractions at 12 Gy per fraction.  Beams/energy:   SBRT/SRT-VMAT // 6X-FFF Photon   Narrative: The patient tolerated radiation treatment relatively well.   The patient did not have any signs of acute toxicity during treatment.  Plan: The patient has completed radiation treatment. The patient will return to radiation oncology clinic for routine followup in one month. I advised the patient to call or return sooner if they have any questions or concerns related to their recovery or treatment.   ------------------------------------------------  Jodelle Gross, MD, PhD  This document serves as a record of services personally performed by Kyung Rudd, MD. It was created on his behalf by Rae Lips, a trained medical scribe. The creation of this record is based on the scribe's personal observations and the provider's statements to them. This document has been checked and approved by the attending provider.

## 2018-10-07 NOTE — Addendum Note (Signed)
Addended by: Darina Hartwell E on: 10/07/2018 03:48 PM   Modules accepted: Orders

## 2018-10-08 ENCOUNTER — Telehealth: Payer: Self-pay | Admitting: Radiation Oncology

## 2018-10-08 NOTE — Telephone Encounter (Signed)
I called to check on the patient and we will continue our plans for CT scan in the near future, but discussed the importance of staying at home and not going out unless necessary given the Covid pandemic. He is in agreement and will proceed as outlined with his scan. I will call him with the results.

## 2018-10-14 NOTE — Telephone Encounter (Signed)
Received notification from [employer] that pt is eligible to receive Eliquis free of charge from 10/09/2018 through 07/10/2019.

## 2018-10-23 ENCOUNTER — Ambulatory Visit
Admission: RE | Admit: 2018-10-23 | Discharge: 2018-10-23 | Disposition: A | Discharge status: Home / Self Care | Payer: Medicare Other | Source: Ambulatory Visit | Attending: Radiation Oncology | Admitting: Radiation Oncology

## 2018-10-23 ENCOUNTER — Other Ambulatory Visit: Payer: Self-pay

## 2018-10-23 DIAGNOSIS — C3412 Malignant neoplasm of upper lobe, left bronchus or lung: Secondary | ICD-10-CM

## 2018-10-23 MED ORDER — IOPAMIDOL (ISOVUE-300) INJECTION 61%
75.0000 mL | Freq: Once | INTRAVENOUS | Status: AC | PRN
  Administered 2018-10-23: 75 mL via INTRAVENOUS

## 2018-10-24 ENCOUNTER — Telehealth: Payer: Self-pay | Admitting: Radiation Oncology

## 2018-10-24 DIAGNOSIS — C3412 Malignant neoplasm of upper lobe, left bronchus or lung: Secondary | ICD-10-CM

## 2018-10-24 NOTE — Telephone Encounter (Signed)
See previous note

## 2018-10-24 NOTE — Telephone Encounter (Deleted)
I also called the patient to let him know the results of his scan and plans. I recommended that he shelter at home which his daughter states he has not been doing.

## 2018-10-24 NOTE — Telephone Encounter (Signed)
I called the patient's daughter to review her father's recent CT scan and the recommendations for repeat in 3 months to follow up on the lesion in the lung and the 13 mm left suprahilar node.

## 2018-10-28 ENCOUNTER — Telehealth: Payer: Self-pay | Admitting: Radiation Oncology

## 2018-10-28 NOTE — Telephone Encounter (Signed)
I was able to speak with the patient about his CT and plans to repeat in 3 months.

## 2018-11-12 ENCOUNTER — Inpatient Hospital Stay (HOSPITAL_BASED_OUTPATIENT_CLINIC_OR_DEPARTMENT_OTHER): Payer: Medicare Other | Admitting: Oncology

## 2018-11-12 ENCOUNTER — Other Ambulatory Visit: Payer: Self-pay

## 2018-11-12 ENCOUNTER — Inpatient Hospital Stay: Payer: Medicare Other | Attending: Oncology

## 2018-11-12 VITALS — BP 137/68 | HR 49 | Temp 97.3°F | Resp 18 | Ht 71.0 in | Wt 166.8 lb

## 2018-11-12 DIAGNOSIS — R911 Solitary pulmonary nodule: Secondary | ICD-10-CM

## 2018-11-12 DIAGNOSIS — Z85038 Personal history of other malignant neoplasm of large intestine: Secondary | ICD-10-CM | POA: Diagnosis not present

## 2018-11-12 DIAGNOSIS — N289 Disorder of kidney and ureter, unspecified: Secondary | ICD-10-CM | POA: Diagnosis not present

## 2018-11-12 DIAGNOSIS — I4891 Unspecified atrial fibrillation: Secondary | ICD-10-CM | POA: Insufficient documentation

## 2018-11-12 DIAGNOSIS — J449 Chronic obstructive pulmonary disease, unspecified: Secondary | ICD-10-CM

## 2018-11-12 DIAGNOSIS — C187 Malignant neoplasm of sigmoid colon: Secondary | ICD-10-CM

## 2018-11-12 DIAGNOSIS — R918 Other nonspecific abnormal finding of lung field: Secondary | ICD-10-CM

## 2018-11-12 DIAGNOSIS — N400 Enlarged prostate without lower urinary tract symptoms: Secondary | ICD-10-CM

## 2018-11-12 LAB — CEA (IN HOUSE-CHCC): CEA (CHCC-In House): 1.64 ng/mL (ref 0.00–5.00)

## 2018-11-12 NOTE — Progress Notes (Signed)
  [name] OFFICE PROGRESS NOTE   Diagnosis: Colon cancer, non-small cell lung cancer  INTERVAL HISTORY:   [name] returns as scheduled.  He completed SBRT to the left lung lesion in February.  He reports a good appetite and energy level.  He has diffuse arthritis pain.  He has slow urination secondary to an enlarged prostate.  No difficulty with bowel function.  He has chronic balance difficulty.  Objective:  Vital signs in last 24 hours:  Blood pressure 137/68, pulse (!) 49, temperature (!) 97.3 F (36.3 C), temperature source Oral, resp. rate 18, height 5' 11" (1.803 m), weight 166 lb 12.8 oz (75.7 kg), SpO2 100 %.    HEENT: Neck without mass Lymphatics: No cervical, supraclavicular, axillary, or inguinal nodes Resp: Distant breath sounds, no respiratory distress Cardio: Regular rate and rhythm GI: No hepatosplenomegaly, nontender, no mass Vascular: No leg edema   Lab Results:  Lab Results  Component Value Date   WBC 6.2 09/24/2018   HGB 13.1 09/24/2018   HCT 39.2 09/24/2018   MCV 86 09/24/2018   PLT 221 09/24/2018   NEUTROABS 4.8 06/04/2012    CMP  Lab Results  Component Value Date   NA 142 09/24/2018   K 4.7 09/24/2018   CL 102 09/24/2018   CO2 26 09/24/2018   GLUCOSE 134 (H) 09/24/2018   BUN 27 09/24/2018   CREATININE 1.38 (H) 09/24/2018   CALCIUM 8.9 09/24/2018   PROT 6.0 (L) 07/16/2018   ALBUMIN 2.3 (L) 07/16/2018   AST 23 07/16/2018   ALT 19 07/16/2018   ALKPHOS 60 07/16/2018   BILITOT 0.9 07/16/2018   GFRNONAA 46 (L) 09/24/2018   GFRAA 53 (L) 09/24/2018    Lab Results  Component Value Date   CEA1 1.60 06/03/2018     Medications: I have reviewed the patient's current medications.   Assessment/Plan: 1. Sigmoid colon cancer, stage II (T3N0), status post a sigmoidectomy 12/26/2017 ? Tumor involving the sigmoid colon and proximal rectum ? MSI-stable, no loss of mismatch repair protein expression ? Staging CTs of the  chest, abdomen, and pelvis on 11/20/2017- focal wall thickening at the distal sigmoid colon, no evidence of metastatic disease, indeterminate 1.9 cm sub-solid left upper lobe nodule 2. Coronary artery disease 3. Atrial fibrillation 4. BPH 5. Left arm/hand intention tremor 6. Report of balance difficulty 7. COPD 8. Indeterminate left upper lobe nodule on the staging chest CT 11/20/2017  Increase in part solid nodule in the left upper lobe, stable groundglass nodule in the superior segment of the left lower lobe  CT biopsy- adenocarcinoma consistent with lung primary, MSS, tumor mutation burden 13, BRAF [id number], PDL-1 TPS 5%. No EGFR, ALK, ERBB2, ROS alteratation  SBRT 08/14/2018- 08/23/2018, 5 fractions  CT chest 415 2020-2.6 x 1.7 cm left upper lobe nodule-enlarged, 13 mm left suprahilar node-possibly new, 9 mm AP window node previously 6 mm 9. Renal insufficiency      Disposition: [name] is in clinical remission from colon cancer.  We will follow-up on the CEA from today.  He completed SBRT to the left lung mass and February.  The mass appeared larger on CT last month.  He is scheduled for a repeat CT in July.  I will see him after the next CT scan.  There is no clinical evidence of a progressive malignancy.  Betsy Coder, MD  11/12/2018  1:08 PM

## 2018-11-13 ENCOUNTER — Telehealth: Payer: Self-pay

## 2018-11-13 ENCOUNTER — Telehealth: Payer: Self-pay | Admitting: Oncology

## 2018-11-13 NOTE — Telephone Encounter (Signed)
Scheduled appt per 5/5 los. ° °A calendar will be mailed out. °

## 2018-11-13 NOTE — Telephone Encounter (Signed)
-----   Message from Ladell Pier, MD sent at 11/13/2018 11:06 AM EDT ----- Please call patient, cea is normal

## 2018-11-13 NOTE — Telephone Encounter (Signed)
Per chart ok to leave a detailed voice message. Left message in regards to lab results.

## 2018-11-14 ENCOUNTER — Telehealth: Payer: Self-pay

## 2018-11-14 NOTE — Telephone Encounter (Signed)
TC to pt per Dr Benay Spice to let him know that his cea is normal. Pt verbalized understanding. No further problems or concerns at this time.

## 2018-11-21 ENCOUNTER — Other Ambulatory Visit: Payer: Self-pay | Admitting: Cardiovascular Disease

## 2018-11-25 ENCOUNTER — Other Ambulatory Visit: Payer: Medicare Other

## 2018-11-26 ENCOUNTER — Telehealth: Payer: Self-pay | Admitting: *Deleted

## 2018-11-26 NOTE — Telephone Encounter (Signed)
Pt called and stated that he needed a lab follow up for eliquis, Returned a call to the pt and spoke with the daughter who stated the pt was [place] post office and would not return until 4pm.  Will attempt to call pt later.

## 2018-11-29 NOTE — Telephone Encounter (Signed)
Called pt and left message to reschedule BMET and CBC - this had been scheduled on 5/18 but pt did not show for lab work.

## 2018-12-03 NOTE — Telephone Encounter (Signed)
Called pt again, r/s lab work for this Thursday 5/28.

## 2018-12-05 ENCOUNTER — Other Ambulatory Visit: Payer: Medicare Other | Admitting: *Deleted

## 2018-12-05 ENCOUNTER — Other Ambulatory Visit: Payer: Self-pay

## 2018-12-05 ENCOUNTER — Encounter (INDEPENDENT_AMBULATORY_CARE_PROVIDER_SITE_OTHER): Payer: Self-pay

## 2018-12-05 DIAGNOSIS — I480 Paroxysmal atrial fibrillation: Secondary | ICD-10-CM

## 2018-12-05 DIAGNOSIS — Z7901 Long term (current) use of anticoagulants: Secondary | ICD-10-CM

## 2018-12-05 LAB — BASIC METABOLIC PANEL
BUN/Creatinine Ratio: 20 (ref 10–24)
BUN: 30 mg/dL — ABNORMAL HIGH (ref 8–27)
CO2: 26 mmol/L (ref 20–29)
Calcium: 9.3 mg/dL (ref 8.6–10.2)
Chloride: 100 mmol/L (ref 96–106)
Creatinine, Ser: 1.51 mg/dL — ABNORMAL HIGH (ref 0.76–1.27)
GFR calc Af Amer: 48 mL/min/{1.73_m2} — ABNORMAL LOW (ref >59)
GFR calc non Af Amer: 41 mL/min/{1.73_m2} — ABNORMAL LOW (ref >59)
Glucose: 114 mg/dL — ABNORMAL HIGH (ref 65–99)
Potassium: 4.5 mmol/L (ref 3.5–5.2)
Sodium: 140 mmol/L (ref 134–144)

## 2018-12-05 LAB — CBC
Hematocrit: 41 % (ref 37.5–51.0)
Hemoglobin: 13.2 g/dL (ref 13.0–17.7)
MCH: 29.2 pg (ref 26.6–33.0)
MCHC: 32.2 g/dL (ref 31.5–35.7)
MCV: 91 fL (ref 79–97)
Platelets: 201 10*3/uL (ref 150–450)
RBC: 4.52 x10E6/uL (ref 4.14–5.80)
RDW: 13.2 % (ref 11.6–15.4)
WBC: 6.5 10*3/uL (ref 3.4–10.8)

## 2018-12-09 ENCOUNTER — Telehealth: Payer: Self-pay | Admitting: Pharmacist

## 2018-12-09 DIAGNOSIS — Z7901 Long term (current) use of anticoagulants: Secondary | ICD-10-CM

## 2018-12-09 NOTE — Telephone Encounter (Signed)
Pt Scr returned at 1.51, age 85, based on this would meet criteria for dose decrease of Eliquis.   Since Scr has fluctuated some, but more recently been 1.3, will plan to repeat panel in 3 months to assess if dose decrease is warranted.   LMOM to discuss and schedule labs.

## 2018-12-10 ENCOUNTER — Other Ambulatory Visit: Payer: Medicare Other

## 2018-12-10 ENCOUNTER — Ambulatory Visit: Payer: Medicare Other | Admitting: Oncology

## 2018-12-10 NOTE — Telephone Encounter (Signed)
Spoke with pt who is aware to continue on Eliquis 5mg  BID. Scheduled recheck labs in mid August.

## 2019-01-23 ENCOUNTER — Ambulatory Visit
Admission: RE | Admit: 2019-01-23 | Discharge: 2019-01-23 | Disposition: A | Discharge status: Home / Self Care | Payer: Medicare Other | Source: Ambulatory Visit | Attending: Radiation Oncology | Admitting: Radiation Oncology

## 2019-01-23 ENCOUNTER — Other Ambulatory Visit: Payer: Self-pay

## 2019-01-23 DIAGNOSIS — C3412 Malignant neoplasm of upper lobe, left bronchus or lung: Secondary | ICD-10-CM | POA: Diagnosis not present

## 2019-01-23 DIAGNOSIS — Z85038 Personal history of other malignant neoplasm of large intestine: Secondary | ICD-10-CM | POA: Diagnosis not present

## 2019-01-23 MED ORDER — IOPAMIDOL (ISOVUE-300) INJECTION 61%
75.0000 mL | Freq: Once | INTRAVENOUS | Status: AC | PRN
  Administered 2019-01-23: 75 mL via INTRAVENOUS

## 2019-01-27 ENCOUNTER — Other Ambulatory Visit: Payer: Self-pay

## 2019-01-27 ENCOUNTER — Telehealth: Payer: Self-pay | Admitting: Oncology

## 2019-01-27 ENCOUNTER — Inpatient Hospital Stay: Payer: Medicare Other | Attending: Oncology | Admitting: Oncology

## 2019-01-27 VITALS — BP 120/60 | HR 96 | Temp 98.7°F | Resp 18 | Ht 71.0 in | Wt 167.8 lb

## 2019-01-27 DIAGNOSIS — Z85038 Personal history of other malignant neoplasm of large intestine: Secondary | ICD-10-CM

## 2019-01-27 DIAGNOSIS — I2510 Atherosclerotic heart disease of native coronary artery without angina pectoris: Secondary | ICD-10-CM

## 2019-01-27 DIAGNOSIS — C187 Malignant neoplasm of sigmoid colon: Secondary | ICD-10-CM

## 2019-01-27 DIAGNOSIS — J449 Chronic obstructive pulmonary disease, unspecified: Secondary | ICD-10-CM

## 2019-01-27 DIAGNOSIS — I4891 Unspecified atrial fibrillation: Secondary | ICD-10-CM

## 2019-01-27 DIAGNOSIS — N400 Enlarged prostate without lower urinary tract symptoms: Secondary | ICD-10-CM | POA: Diagnosis not present

## 2019-01-27 DIAGNOSIS — C3412 Malignant neoplasm of upper lobe, left bronchus or lung: Secondary | ICD-10-CM | POA: Diagnosis not present

## 2019-01-27 NOTE — Progress Notes (Signed)
[facility] OFFICE PROGRESS NOTE   Diagnosis: Colon cancer, non-small cell lung cancer  INTERVAL HISTORY:   [name] returns as scheduled.  He feels well.  He continues to have difficulty with leg weakness and balance.  These are chronic complaints.  No dyspnea.  He continues to live independently.  Objective:  Vital signs in last 24 hours:  Blood pressure 120/60, pulse 96, temperature 98.7 F (37.1 C), temperature source Oral, resp. rate 18, height '5\' 11"'  (1.803 m), weight 167 lb 12.8 oz (76.1 kg), SpO2 97 %.   Limited physical examination secondary to distancing with the COVID pandemic HEENT: Neck without mass Lymphatics: No cervical, supraclavicular, axillary, or inguinal nodes GI: No hepatosplenomegaly, no mass, nontender Vascular: No leg edema  Lab Results:  Lab Results  Component Value Date   WBC 6.5 12/05/2018   HGB 13.2 12/05/2018   HCT 41.0 12/05/2018   MCV 91 12/05/2018   PLT 201 12/05/2018   NEUTROABS 4.8 06/04/2012    CMP  Lab Results  Component Value Date   NA 140 12/05/2018   K 4.5 12/05/2018   CL 100 12/05/2018   CO2 26 12/05/2018   GLUCOSE 114 (H) 12/05/2018   BUN 30 (H) 12/05/2018   CREATININE 1.51 (H) 12/05/2018   CALCIUM 9.3 12/05/2018   PROT 6.0 (L) 07/16/2018   ALBUMIN 2.3 (L) 07/16/2018   AST 23 07/16/2018   ALT 19 07/16/2018   ALKPHOS 60 07/16/2018   BILITOT 0.9 07/16/2018   GFRNONAA 41 (L) 12/05/2018   GFRAA 48 (L) 12/05/2018    Lab Results  Component Value Date   CEA1 1.64 11/12/2018    Imaging:  Ct Chest W Contrast  Result Date: 01/23/2019 CLINICAL DATA:  Left upper lobe lung cancer. Restaging. Status post radiation therapy. History of colon cancer. Status post partial colectomy. EXAM: CT CHEST WITH CONTRAST TECHNIQUE: Multidetector CT imaging of the chest was performed during intravenous contrast administration. CONTRAST:  9m ISOVUE-300 IOPAMIDOL (ISOVUE-300) INJECTION 61% Creat[place]  [place] [facility] [address] Results: Creatinine 1.4 mg/dL. COMPARISON:  10/23/2018 FINDINGS: Cardiovascular: The heart size is within normal limits. No pericardial effusion. Aortic atherosclerosis. Lad, left circumflex and RCA coronary artery calcifications again noted. Mediastinum/Nodes: New mediastinal and progressive hilar adenopathy. -index left pre-vascular lymph node measures 1.7 cm, image 58/2. Previously 0.6 cm. -index left paratracheal lymph node measures 1.8 cm, image 55/2. Previously 0.9 cm. -Index left hilar lymph node measures 1.6 cm, image 63/2. Previously 1.1 cm. The trachea appears patent and is midline. Normal appearance of the thyroid gland. Normal appearance of the esophagus. Lungs/Pleura: Advanced change of emphysema. Geographic area of masslike architectural distortion with surrounding ground-glass attenuation and fibrosis is noted compatible with changes due to external beam radiation. This area measures 6.6 by 4.0 cm, image 65/3. Underlying lung primary is indistinguishable from masslike architectural distortion and cannot be remeasured. Stable patchy area of ground-glass attenuation within the superior segment of left lower lobe. Similar appearance of left a pickle pleuroparenchymal scarring and calcification. Upper Abdomen: No acute abnormality. Kidney cysts are again noted. No adrenal gland mass identified. Musculoskeletal: No chest wall abnormality. No acute or significant osseous findings. IMPRESSION: 1. Interval progression of left hilar adenopathy. There is also multiple new, pathologically enlarged left-sided mediastinal lymph nodes compatible with progressive metastatic adenopathy. 2. Large geographic area of masslike architectural distortion in the left upper lobe is presumed to represent changes secondary to external beam radiation. This obscures primary  left upper lobe lung lesion. 3. Aortic Atherosclerosis (ICD10-I70.0) and Emphysema (ICD10-J43.9). 4. Coronary  artery calcifications. Electronically Signed   By: Kerby Moors M.D.   On: 01/23/2019 16:06    Medications: I have reviewed the patient's current medications.   Assessment/Plan: 1. Sigmoid colon cancer, stage II (T3N0), status post a sigmoidectomy 12/26/2017 ? Tumor involving the sigmoid colon and proximal rectum ? MSI-stable, no loss of mismatch repair protein expression ? Staging CTs of the chest, abdomen, and pelvis on 11/20/2017- focal wall thickening at the distal sigmoid colon, no evidence of metastatic disease, indeterminate 1.9 cm sub-solid left upper lobe nodule 2. Coronary artery disease 3. Atrial fibrillation 4. BPH 5. Left arm/hand intention tremor 6. Report of balance difficulty 7. COPD 8. Indeterminate left upper lobe nodule on the staging chest CT 11/20/2017  Increase in part solid nodule in the left upper lobe, stable groundglass nodule in the superior segment of the left lower lobe  CT biopsy- adenocarcinoma consistent with lung primary, MSS, tumor mutation burden 13, BRAF [id number], PDL-1 TPS 5%. No EGFR, ALK, ERBB2, ROS alteratation  SBRT 08/14/2018- 08/23/2018, 5 fractions  CT chest 10/23/2018 -2.6 x 1.7 cm left upper lobe nodule-enlarged, 13 mm left suprahilar node-possibly new, 9 mm AP window node previously 6 mm  CT chest 01/23/2019- new mediastinal and progressive hilar adenopathy, 9. Renal insufficiency    Disposition: [name] appears unchanged.  The restaging chest CT reveals progressive left upper lung density and new/progressive mediastinal and hilar adenopathy.  I reviewed the CT images with [name].  He understands the lymphadenopathy likely reflects progression of the non-small cell lung cancer.  The progressive chest density may be related to tumor versus radiation change.  We discussed treatment options including supportive care, radiation to the mediastinum with concurrent Taxol/carboplatin, and immunotherapy.  [name] has a good performance  status.  He would like to proceed with definitive therapy.  I will make referral to Dr. Lisbeth Renshaw.  He will be referred for a PET scan.  I recommend Taxol/carboplatin and radiation to the mediastinum/hilar areas if the PET scan shows no evidence of distant metastatic disease.  We reviewed potential toxicities associated with the Taxol/carboplatin regimen including the chance for nausea, alopecia, and hematologic toxicity.  We discussed the allergic reaction and neuropathy associated with Taxol.  We discussed the allergic reaction associated with carboplatin.  He agrees to proceed.  He will be scheduled for a lab visit on the day of the staging PET scan.  He will attend a chemotherapy teaching class.  The current plan is to begin combined modality therapy during the week of 02/17/2019.  40 minutes were spent with the patient today.  The majority of the time was used for counseling and coordination of care.  Betsy Coder, MD  01/27/2019  11:35 AM

## 2019-01-27 NOTE — Progress Notes (Signed)
START ON PATHWAY REGIMEN - Non-Small Cell Lung     Administer weekly:     Paclitaxel      Carboplatin   **Always confirm dose/schedule in your pharmacy ordering system**  Patient Characteristics: Stage III - Unresectable, PS = 0, 1 AJCC T Category: Staged < 8th Ed. Current Disease Status: No Distant Mets or Local Recurrence AJCC N Category: Staged < 8th Ed. AJCC M Category: Staged < 8th Ed. AJCC 8 Stage Grouping: Staged < 8th Ed. ECOG Performance Status: 0 Intent of Therapy: Curative Intent, Discussed with Patient

## 2019-01-27 NOTE — Telephone Encounter (Signed)
Gave avs and calendar ° °

## 2019-02-03 ENCOUNTER — Inpatient Hospital Stay: Payer: Medicare Other

## 2019-02-03 ENCOUNTER — Other Ambulatory Visit: Payer: Self-pay

## 2019-02-03 ENCOUNTER — Ambulatory Visit (HOSPITAL_COMMUNITY)
Admission: RE | Admit: 2019-02-03 | Discharge: 2019-02-03 | Disposition: A | Discharge status: Home / Self Care | Payer: Medicare Other | Source: Ambulatory Visit | Attending: Oncology | Admitting: Oncology

## 2019-02-03 DIAGNOSIS — I4891 Unspecified atrial fibrillation: Secondary | ICD-10-CM | POA: Diagnosis not present

## 2019-02-03 DIAGNOSIS — C771 Secondary and unspecified malignant neoplasm of intrathoracic lymph nodes: Secondary | ICD-10-CM | POA: Diagnosis not present

## 2019-02-03 DIAGNOSIS — N281 Cyst of kidney, acquired: Secondary | ICD-10-CM | POA: Diagnosis not present

## 2019-02-03 DIAGNOSIS — I2510 Atherosclerotic heart disease of native coronary artery without angina pectoris: Secondary | ICD-10-CM | POA: Insufficient documentation

## 2019-02-03 DIAGNOSIS — R918 Other nonspecific abnormal finding of lung field: Secondary | ICD-10-CM | POA: Insufficient documentation

## 2019-02-03 DIAGNOSIS — C189 Malignant neoplasm of colon, unspecified: Secondary | ICD-10-CM | POA: Diagnosis not present

## 2019-02-03 DIAGNOSIS — N400 Enlarged prostate without lower urinary tract symptoms: Secondary | ICD-10-CM | POA: Diagnosis not present

## 2019-02-03 DIAGNOSIS — J449 Chronic obstructive pulmonary disease, unspecified: Secondary | ICD-10-CM | POA: Diagnosis not present

## 2019-02-03 DIAGNOSIS — C187 Malignant neoplasm of sigmoid colon: Secondary | ICD-10-CM | POA: Insufficient documentation

## 2019-02-03 DIAGNOSIS — C3490 Malignant neoplasm of unspecified part of unspecified bronchus or lung: Secondary | ICD-10-CM | POA: Diagnosis not present

## 2019-02-03 DIAGNOSIS — I700 Atherosclerosis of aorta: Secondary | ICD-10-CM | POA: Insufficient documentation

## 2019-02-03 DIAGNOSIS — Z85038 Personal history of other malignant neoplasm of large intestine: Secondary | ICD-10-CM | POA: Diagnosis not present

## 2019-02-03 DIAGNOSIS — C3412 Malignant neoplasm of upper lobe, left bronchus or lung: Secondary | ICD-10-CM | POA: Diagnosis not present

## 2019-02-03 LAB — CMP (CANCER CENTER ONLY)
ALT: 21 U/L (ref 0–44)
AST: 22 U/L (ref 15–41)
Albumin: 3.8 g/dL (ref 3.5–5.0)
Alkaline Phosphatase: 136 U/L — ABNORMAL HIGH (ref 38–126)
Anion gap: 7 (ref 5–15)
BUN: 22 mg/dL (ref 8–23)
CO2: 28 mmol/L (ref 22–32)
Calcium: 9.3 mg/dL (ref 8.9–10.3)
Chloride: 104 mmol/L (ref 98–111)
Creatinine: 1.39 mg/dL — ABNORMAL HIGH (ref 0.61–1.24)
GFR, Est AFR Am: 52 mL/min — ABNORMAL LOW (ref >60)
GFR, Estimated: 45 mL/min — ABNORMAL LOW (ref >60)
Glucose, Bld: 104 mg/dL — ABNORMAL HIGH (ref 70–99)
Potassium: 4.9 mmol/L (ref 3.5–5.1)
Sodium: 139 mmol/L (ref 135–145)
Total Bilirubin: 1.5 mg/dL — ABNORMAL HIGH (ref 0.3–1.2)
Total Protein: 7.3 g/dL (ref 6.5–8.1)

## 2019-02-03 LAB — GLUCOSE, CAPILLARY: Glucose-Capillary: 106 mg/dL — ABNORMAL HIGH (ref 70–99)

## 2019-02-03 LAB — CBC WITH DIFFERENTIAL (CANCER CENTER ONLY)
Abs Immature Granulocytes: 0.01 10*3/uL (ref 0.00–0.07)
Basophils Absolute: 0.1 10*3/uL (ref 0.0–0.1)
Basophils Relative: 1 %
Eosinophils Absolute: 0.2 10*3/uL (ref 0.0–0.5)
Eosinophils Relative: 3 %
HCT: 40.9 % (ref 39.0–52.0)
Hemoglobin: 13.1 g/dL (ref 13.0–17.0)
Immature Granulocytes: 0 %
Lymphocytes Relative: 18 %
Lymphs Abs: 1.3 10*3/uL (ref 0.7–4.0)
MCH: 29.6 pg (ref 26.0–34.0)
MCHC: 32 g/dL (ref 30.0–36.0)
MCV: 92.3 fL (ref 80.0–100.0)
Monocytes Absolute: 0.7 10*3/uL (ref 0.1–1.0)
Monocytes Relative: 10 %
Neutro Abs: 4.8 10*3/uL (ref 1.7–7.7)
Neutrophils Relative %: 68 %
Platelet Count: 184 10*3/uL (ref 150–400)
RBC: 4.43 MIL/uL (ref 4.22–5.81)
RDW: 13.7 % (ref 11.5–15.5)
WBC Count: 7 10*3/uL (ref 4.0–10.5)
nRBC: 0 % (ref 0.0–0.2)

## 2019-02-03 MED ORDER — FLUDEOXYGLUCOSE F - 18 (FDG) INJECTION
8.8000 | Freq: Once | INTRAVENOUS | Status: AC | PRN
  Administered 2019-02-03: 14:00:00 8.8 via INTRAVENOUS

## 2019-02-04 ENCOUNTER — Other Ambulatory Visit: Payer: Medicare Other

## 2019-02-04 ENCOUNTER — Ambulatory Visit
Admission: RE | Admit: 2019-02-04 | Discharge: 2019-02-04 | Disposition: A | Discharge status: Home / Self Care | Payer: Medicare Other | Source: Ambulatory Visit | Attending: Radiation Oncology | Admitting: Radiation Oncology

## 2019-02-04 ENCOUNTER — Telehealth: Payer: Self-pay | Admitting: *Deleted

## 2019-02-04 ENCOUNTER — Inpatient Hospital Stay: Payer: Medicare Other

## 2019-02-04 DIAGNOSIS — C3412 Malignant neoplasm of upper lobe, left bronchus or lung: Secondary | ICD-10-CM | POA: Diagnosis not present

## 2019-02-04 DIAGNOSIS — Z87891 Personal history of nicotine dependence: Secondary | ICD-10-CM | POA: Diagnosis not present

## 2019-02-04 DIAGNOSIS — Z923 Personal history of irradiation: Secondary | ICD-10-CM | POA: Diagnosis not present

## 2019-02-04 DIAGNOSIS — R590 Localized enlarged lymph nodes: Secondary | ICD-10-CM | POA: Diagnosis not present

## 2019-02-04 DIAGNOSIS — C187 Malignant neoplasm of sigmoid colon: Secondary | ICD-10-CM

## 2019-02-04 DIAGNOSIS — Z85038 Personal history of other malignant neoplasm of large intestine: Secondary | ICD-10-CM | POA: Diagnosis not present

## 2019-02-04 DIAGNOSIS — C3490 Malignant neoplasm of unspecified part of unspecified bronchus or lung: Secondary | ICD-10-CM

## 2019-02-04 NOTE — Progress Notes (Signed)
Radiation Oncology         [phone] ________________________________  Outpatient Reconsultation - Conducted via telephone due to current COVID-19 concerns for limiting patient exposure  I spoke with the patient to conduct this consult visit via telephone to spare the patient unnecessary potential exposure in the healthcare setting during the current COVID-19 pandemic. The patient was notified in advance and was offered a [place] meeting to allow for face to face communication but unfortunately reported that they did not have the appropriate resources/technology to support such a visit and instead preferred to proceed with a telephone visit.  ________________________________  Name: [name]        MRN: [id number]  Date of Service: 02/04/2019 DOB: [birth year]  SA:YTKZ, Gwyndolyn Saxon, MD  Ladell Pier, MD     REFERRING PHYSICIAN: Ladell Pier, MD   DIAGNOSIS: The primary encounter diagnosis was Cancer of sigmoid colon s/p sigmoid colectomy 12/26/2017. A diagnosis of Malignant neoplasm of bronchus of left upper lobe (HCC) was also pertinent to this visit.   HISTORY OF PRESENT ILLNESS: [name] is a 85 y.o. male originally seen at the request of Dr. Servando Snare for a newly diagnosed NSCLC of the RUL with a history of pT3N0 adenocarcinoma of the sigmoid colon followed by Dr. Benay Spice. The patient was found to have an adenocarcinoma of the sigmoid colon in the spring of 2019. His work up with CT chest on 11/20/17 revealed a nodule measuring 1.9 cm in the RUL, and he went on to proceed with robotic sigmoidectomy on 12/26/17 with Dr. Marcello Moores and this revealed a T3N0 cancer. He was counseled on surveillance, and he had repeat CT of the chest on 06/03/18 that revealed the lesion in the LUL measuring 2.1 x 1.4 cm and there was a solid component in the nodule that was larger at 1.3 cm, compared to 5 mm on prior scan. There was also a stable ground glass nodule also seen in the LLL measuring 1.4 cm.  He was counseled on a PET scan and this was performed on 06/26/18 and revealed hypermetabolic change in the stomach with an SUV of 14.2, and hypermetabolism in the LUL lesion with an SUV of 4.5. He did undergo a CT guided biopsy on 07/09/18 that revealed an adenocarcinoma consistent with lung primary. He had a complicated course following his biopsy requiring a chest tube and hospitalization. His chest tube was dislodged accidentally by the patient and had to be replaced. He was not a surgical candidate and received SBRT. Unfortunately surveillance imaging has shown an increase in the size of his thoracic nodes. April 2020's scan revealed a left suprahilar nodes at 13 mm, and 9 mm AP window node previously 6 mm. It was felt that these could be reactive. He had repeat imaging at a shorter interval to further evaluate this and on 01/23/2019 which showed an increase in his adenopathy, the prevascular node was 17 mm, left paratracheal node 18 mm, and left hilar node 16 mm. A PET scan yesterday revealed hypermetabolism in those nodes and in the LUL previously treated site but no metastatic disease. He's contemplating chemoRT and is contacted today to discuss this.   PREVIOUS RADIATION THERAPY:   08/14/2018-08/23/2018 SBRT Treatment. The tumor in the LUL was treated with a course of stereotactic body radiation treatment. The patient received 60 Gy in 5 fractions at 12 Gy per fraction.  PAST MEDICAL HISTORY:  Past Medical History:  Diagnosis Date   A-fib [facility])    Arthritis  BPH (benign prostatic hyperplasia)    Cancer (HCC)    colon cancer    Chest pain, unspecified    Chronic kidney disease    Coronary atherosclerosis of unspecified type of vessel, native or graft    Dizziness and giddiness    Hyperlipidemia    Hypertension    Lung cancer ([place])    bx's done 08/09/2017   Myocardial infarction ([place]) 12 years ago   x2   Non-ST elevation (NSTEMI) myocardial infarction ([place]) 08/2017   dx  afib,replaced stent   Peripheral neuropathy    PVD (peripheral vascular disease) ([place])    Shortness of breath    exertion   Vitamin B12 deficiency        PAST SURGICAL HISTORY: Past Surgical History:  Procedure Laterality Date   COLON SURGERY     partial colectomy Dr. Marcello Moores 12-14-17   CORONARY ANGIOPLASTY     4 stents   CORONARY STENT INTERVENTION N/A 08/09/2017   Procedure: CORONARY STENT INTERVENTION;  Surgeon: Nelva Bush, MD;  Location: [facility] CV LAB;  Service: Cardiovascular;  Laterality: N/A;   FLEXIBLE SIGMOIDOSCOPY N/A 11/12/2017   Procedure: FLEXIBLE SIGMOIDOSCOPY;  Surgeon: Irene Shipper, MD;  Location: [facility];  Service: Endoscopy;  Laterality: N/A;   HERNIA REPAIR     right inguinal   INGUINAL HERNIA REPAIR  06/14/2012   Procedure: HERNIA REPAIR INGUINAL ADULT;  Surgeon: Odis Hollingshead, MD;  Location: [facility];  Service: General;  Laterality: Right;   INSERTION OF MESH  06/14/2012   Procedure: INSERTION OF MESH;  Surgeon: Odis Hollingshead, MD;  Location: [facility];  Service: General;  Laterality: Right;   LAPAROSCOPIC PARTIAL COLECTOMY N/A 12/26/2017   Procedure: LAPAROSCOPIC PARTIAL COLECTOMY ERAS PATHWAY;  Surgeon: Leighton Ruff, MD;  Location: [facility];  Service: General;  Laterality: N/A;   LEFT HEART CATH AND CORONARY ANGIOGRAPHY N/A 08/09/2017   Procedure: LEFT HEART CATH AND CORONARY ANGIOGRAPHY;  Surgeon: Nelva Bush, MD;  Location: [facility] CV [facility];  Service: Cardiovascular;  Laterality: N/A;   PROCTOSCOPY N/A 12/26/2017   Procedure: PROCTOSCOPY;  Surgeon: Leighton Ruff, MD;  Location: [facility];  Service: General;  Laterality: N/A;   SINUS SURGERY WITH INSTATRAK  1985     FAMILY HISTORY:  Family History  Problem Relation Age of Onset   Heart attack Father 38   Arthritis Sister    Hypothyroidism Daughter    Colon cancer Neg Hx    Esophageal cancer Neg Hx    Rectal cancer Neg Hx    Stomach cancer Neg Hx      SOCIAL  HISTORY:  reports that he quit smoking about 37 years ago. He has never used smokeless tobacco. He reports that he does not drink alcohol or use drugs. The patient is widowed and lives in [place]. 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 His daughter [name] lives next door and helps with medical decision making.    ALLERGIES: Gadolinium derivatives   MEDICATIONS:  Current Outpatient Medications  Medication Sig Dispense Refill   apixaban (ELIQUIS) 5 MG TABS tablet Take 1 tablet (5 mg total) by mouth 2 (two) times daily. 60 tablet 0   aspirin EC 81 MG tablet Take 1 tablet (81 mg total) by mouth daily. 90 tablet 3   atorvastatin (LIPITOR) 40 MG tablet TAKE ONE TABLET EACH DAY AT 6PM 90 tablet 3   lisinopril (PRINIVIL,ZESTRIL) 5 MG tablet Take 0.5 tablets (2.5 mg total) by mouth daily. 45 tablet  metoprolol succinate (TOPROL-XL) 25 MG 24 hr tablet Take 3 tablets (75 mg total) by mouth daily. 90 tablet 9   vitamin B-12 (CYANOCOBALAMIN) 1000 MCG tablet Take 1,000 mcg by mouth daily.     No current facility-administered medications for this encounter.      REVIEW OF SYSTEMS: On review of systems, the patient reports that he's doing okay. He had one episode of numbness in the chest, that has not returned. He reports some imbalance and progressive memory loss. He  denies any chest pain, shortness of breath, cough, fevers, chills, night sweats, unintended weight changes. He denies any bowel or bladder disturbances, and denies abdominal pain, nausea or vomiting. He denies any new musculoskeletal or joint aches or pains, new skin lesions or concerns. A complete review of systems is obtained and is otherwise negative.      PHYSICAL EXAM:  Unable to assess due to nature of encounter   ECOG = 1  0 - Asymptomatic (Fully active, able to carry on all predisease activities without restriction)  1 - Symptomatic but completely ambulatory (Restricted in physically  strenuous activity but ambulatory and able to carry out work of a light or sedentary nature. For example, light housework, office work)  2 - Symptomatic, <50% in bed during the day (Ambulatory and capable of all self care but unable to carry out any work activities. Up and about more than 50% of waking hours)  3 - Symptomatic, >50% in bed, but not bedbound (Capable of only limited self-care, confined to bed or chair 50% or more of waking hours)  4 - Bedbound (Completely disabled. Cannot carry on any self-care. Totally confined to bed or chair)  5 - Death   Eustace Pen MM, Creech RH, Tormey DC, et al. [phone]). "Toxicity and response criteria of the [facility]". Daniel Oncol. 5 (6): 649-55    LABORATORY DATA:  Lab Results  Component Value Date   WBC 7.0 02/03/2019   HGB 13.1 02/03/2019   HCT 40.9 02/03/2019   MCV 92.3 02/03/2019   PLT 184 02/03/2019   Lab Results  Component Value Date   NA 139 02/03/2019   K 4.9 02/03/2019   CL 104 02/03/2019   CO2 28 02/03/2019   Lab Results  Component Value Date   ALT 21 02/03/2019   AST 22 02/03/2019   ALKPHOS 136 (H) 02/03/2019   BILITOT 1.5 (H) 02/03/2019      RADIOGRAPHY: Ct Chest W Contrast  Result Date: 01/23/2019 CLINICAL DATA:  Left upper lobe lung cancer. Restaging. Status post radiation therapy. History of colon cancer. Status post partial colectomy. EXAM: CT CHEST WITH CONTRAST TECHNIQUE: Multidetector CT imaging of the chest was performed during intravenous contrast administration. CONTRAST:  85mL ISOVUE-300 IOPAMIDOL (ISOVUE-300) INJECTION 61% Creatinine was obtained on site at [facility] at [address] Results: Creatinine 1.4 mg/dL. COMPARISON:  10/23/2018 FINDINGS: Cardiovascular: The heart size is within normal limits. No pericardial effusion. Aortic atherosclerosis. Lad, left circumflex and RCA coronary artery calcifications again noted. Mediastinum/Nodes: New mediastinal and progressive  hilar adenopathy. -index left pre-vascular lymph node measures 1.7 cm, image 58/2. Previously 0.6 cm. -index left paratracheal lymph node measures 1.8 cm, image 55/2. Previously 0.9 cm. -Index left hilar lymph node measures 1.6 cm, image 63/2. Previously 1.1 cm. The trachea appears patent and is midline. Normal appearance of the thyroid gland. Normal appearance of the esophagus. Lungs/Pleura: Advanced change of emphysema. Geographic area of masslike architectural distortion with surrounding ground-glass attenuation  and fibrosis is noted compatible with changes due to external beam radiation. This area measures 6.6 by 4.0 cm, image 65/3. Underlying lung primary is indistinguishable from masslike architectural distortion and cannot be remeasured. Stable patchy area of ground-glass attenuation within the superior segment of left lower lobe. Similar appearance of left a pickle pleuroparenchymal scarring and calcification. Upper Abdomen: No acute abnormality. Kidney cysts are again noted. No adrenal gland mass identified. Musculoskeletal: No chest wall abnormality. No acute or significant osseous findings. IMPRESSION: 1. Interval progression of left hilar adenopathy. There is also multiple new, pathologically enlarged left-sided mediastinal lymph nodes compatible with progressive metastatic adenopathy. 2. Large geographic area of masslike architectural distortion in the left upper lobe is presumed to represent changes secondary to external beam radiation. This obscures primary left upper lobe lung lesion. 3. Aortic Atherosclerosis (ICD10-I70.0) and Emphysema (ICD10-J43.9). 4. Coronary artery calcifications. Electronically Signed   By: Kerby Moors M.D.   On: 01/23/2019 16:06   Nm Pet Image Restag (ps) Skull Base To Thigh  Result Date: 02/03/2019 CLINICAL DATA:  Subsequent treatment strategy for lung and colon cancer. EXAM: NUCLEAR MEDICINE PET SKULL BASE TO THIGH TECHNIQUE: 8.8 mCi F-18 FDG was injected  intravenously. Full-ring PET imaging was performed from the skull base to thigh after the radiotracer. CT data was obtained and used for attenuation correction and anatomic localization. Fasting blood glucose: 106 mg/dl COMPARISON:  CT chest dated 01/23/2019.  PET-CT dated 06/26/2018. FINDINGS: Mediastinal blood pool activity: SUV max 2.8 Liver activity: SUV max NA NECK: No hypermetabolic cervical lymphadenopathy. Incidental CT findings: none CHEST: Progressive predominantly solid mass in the left upper lobe measuring 5.3 x 3.3 cm on the current study (series 8/image 30), max SUV 5.6. While some of this appearance may reflect radiation changes, the appearance is considered concerning for underlying viable tumor. Thoracic nodal metastases, including: --12 mm short axis left superior mediastinal node (series 4/image 44), max SUV 7.8 --Prevascular nodes measuring up to 17 mm short axis (series 4/image 56), max SUV 8.8 --17 mm short axis AP window node (series 4/image 55), max SUV 7.4 --16 mm short axis left hilar node (series 4/image 60), max SUV 8.5 Incidental CT findings: Atherosclerotic calcifications of the aortic arch. Three vessel coronary atherosclerosis. ABDOMEN/PELVIS: Prior sigmoid colon resection with suture line in the pelvis. No abnormal hypermetabolism in the liver, spleen, pancreas, or bilateral adrenal glands. No hypermetabolic abdominopelvic lymphadenopathy. Incidental CT findings: Atherosclerotic calcifications the abdominal aorta and branch vessels. Left renal cysts and renal sinus cysts. Prostatomegaly. Thick-walled bladder, suggesting bladder outlet obstruction, although underdistended. SKELETON: No focal hypermetabolic activity to suggest skeletal metastasis. Incidental CT findings: Degenerative changes of the visualized thoracolumbar spine. Suspected bilateral pars defects at L5-S1. IMPRESSION: Progressive left upper lobe mass measuring up to 5.3 cm. While some of this appearance may reflect  radiation changes, this is considered concerning for underlying viable tumor. Thoracic nodal metastases, as above. No evidence of metastatic disease in the abdomen/pelvis. Electronically Signed   By: Julian Hy M.D.   On: 02/03/2019 19:36       IMPRESSION/PLAN: 1. Progressive Stage IA3, cT1cN0M0, NSCLC, adenocarcinoma of the LUL of the lung. Dr. Lisbeth Renshaw discusses the pathology findings and reviews the nature of progressive lung disease. He appears to be a candidate for chemoRT, however we will complete his workup with an MRI of the brain first before proceeding. We discussed the risks, benefits, short, and long term effects of radiotherapy, and the patient is interested in proceeding. Dr. Lisbeth Renshaw discusses the  delivery and logistics of radiotherapy and anticipates a course of 6 1/2 weeks of radiotherapy to the thoracic adenopathy but not retreating the lung. We will follow up with his MRI prior to coordinating simulation.   2. Stage IIA, pT3N0M0 adenocarcinoma of the sigmoid colon. He will follow up with Dr. Benay Spice for surveillance.  3. Memory Loss and Imbalance. As above we will proceed with MRI of the brain. We will cancel his simulation tomorrow until we have the MRI results. He is aware if he had brain disease we would change the treatment plan outlined above and that radiation may be indicated.    Given current concerns for patient exposure during the COVID-19 pandemic, this encounter was conducted via telephone.  The patient has given verbal consent for this type of encounter. The time spent during this encounter was 45 minutes and 50% of that time was spent in the coordination of his care. The attendants for this meeting include Dr. Lisbeth Renshaw, Shona Simpson, [facility], [name] and his daughter Ferne Reus. During the encounter, Dr. Lisbeth Renshaw and Shona Simpson [facility] was located at [facility] On[facility] Department.  [name] and [place] were located at  home.   The above documentation reflects my direct findings during this shared patient visit. Please see the separate note by Dr. Lisbeth Renshaw on this date for the remainder of the patient's plan of care.    Carola Rhine, PAC

## 2019-02-05 ENCOUNTER — Telehealth: Payer: Self-pay

## 2019-02-05 ENCOUNTER — Telehealth: Payer: Self-pay | Admitting: *Deleted

## 2019-02-05 ENCOUNTER — Ambulatory Visit: Payer: Medicare Other | Admitting: Radiation Oncology

## 2019-02-05 NOTE — Telephone Encounter (Signed)
-----   Message from Ladell Pier, MD sent at 02/04/2019  1:44 PM EDT ----- Please call patient, PET scan shows hypermetabolic left lung mass and chest lymph nodes-this is what was expected, no evidence of distant metastatic disease, plan to proceed with chemotherapy and radiation scheduled

## 2019-02-05 NOTE — Telephone Encounter (Signed)
Left pt vm msg in regards to results below Per Dr Onalee Hua, David Price, MD  [place] [address]        Please call patient, PET scan shows hypermetabolic left lung mass and chest lymph nodes-this is what was expected, no evidence of distant metastatic disease, plan to proceed with chemotherapy and radiation scheduled

## 2019-02-05 NOTE — Telephone Encounter (Signed)
Notified of PET results and that his lung cancer shows not distant metastasis. Still needs to proceed with the brain MRI to complete staging. He is aware plan is still chemo/RT

## 2019-02-05 NOTE — Telephone Encounter (Signed)
Called patient 's daughter Ferne Reus to inform of MRI for 02-12-19 - arrival time- 12:30 pm @ [facility], patient to pick-up script no later than August 3, go to the drugstore and get it filled and take as directed by pharm., patient's daughter Ferne Reus verified understanding this appt. and the prep for the appt.

## 2019-02-11 ENCOUNTER — Telehealth: Payer: Self-pay | Admitting: Radiation Oncology

## 2019-02-11 NOTE — Telephone Encounter (Signed)
I spoke with the patient's daughter [name] to review that it would be a good idea to have [name]' upcoming board meeting remotely by phone or video call if possible rather than in person given the covid pandemic, and also plan to coordinate simulation following his MRI tomorrow if possible. I've asked simulation to give him a call to try to coordinate.

## 2019-02-12 ENCOUNTER — Ambulatory Visit (HOSPITAL_COMMUNITY)
Admission: RE | Admit: 2019-02-12 | Discharge: 2019-02-12 | Disposition: A | Discharge status: Home / Self Care | Payer: Medicare Other | Source: Ambulatory Visit | Attending: Radiation Oncology | Admitting: Radiation Oncology

## 2019-02-12 ENCOUNTER — Ambulatory Visit: Payer: Medicare Other

## 2019-02-12 ENCOUNTER — Telehealth: Payer: Self-pay | Admitting: *Deleted

## 2019-02-12 ENCOUNTER — Other Ambulatory Visit: Payer: Self-pay

## 2019-02-12 DIAGNOSIS — C3490 Malignant neoplasm of unspecified part of unspecified bronchus or lung: Secondary | ICD-10-CM | POA: Insufficient documentation

## 2019-02-12 DIAGNOSIS — C3492 Malignant neoplasm of unspecified part of left bronchus or lung: Secondary | ICD-10-CM | POA: Diagnosis not present

## 2019-02-12 MED ORDER — GADOBUTROL 1 MMOL/ML IV SOLN
7.0000 mL | Freq: Once | INTRAVENOUS | Status: AC | PRN
  Administered 2019-02-12: 14:00:00 7 mL via INTRAVENOUS

## 2019-02-12 NOTE — Telephone Encounter (Signed)
Per radiation oncology: 1st tx will be on 02/24/19. Left VM for patient that his appointments w/1st Taxol/Carbo will be moved out to 02/24/19. Scheduler will call him. Scheduling message sent with change.

## 2019-02-14 ENCOUNTER — Telehealth: Payer: Self-pay | Admitting: *Deleted

## 2019-02-14 ENCOUNTER — Ambulatory Visit
Admission: RE | Admit: 2019-02-14 | Discharge: 2019-02-14 | Disposition: A | Discharge status: Home / Self Care | Payer: Medicare Other | Source: Ambulatory Visit | Attending: Radiation Oncology | Admitting: Radiation Oncology

## 2019-02-14 ENCOUNTER — Other Ambulatory Visit: Payer: Self-pay

## 2019-02-14 ENCOUNTER — Encounter: Payer: Self-pay | Admitting: Urology

## 2019-02-14 ENCOUNTER — Telehealth: Payer: Self-pay | Admitting: Oncology

## 2019-02-14 DIAGNOSIS — Z510 Encounter for antineoplastic radiation therapy: Secondary | ICD-10-CM | POA: Insufficient documentation

## 2019-02-14 DIAGNOSIS — C3412 Malignant neoplasm of upper lobe, left bronchus or lung: Secondary | ICD-10-CM

## 2019-02-14 NOTE — Telephone Encounter (Signed)
Per Medical oncology chemo is to start on the day radiation starts.  Patient with some concerns that epic appointments are showing that his chemotherapy is to start on 02/17/2019.  Left a voicemail with Dr. Gearldine Shown nurse to call the patients daughter [name] to clarify this.  Will continue to follow as necessary.  Gloriajean Dell. Leonie Green, BSN

## 2019-02-14 NOTE — Telephone Encounter (Addendum)
Called patient to discuss his appointment calendar and daughter answered phone. Cancelled his lab/treatment on 8/10, but needs to keep OV since Dr. Benay Spice will be out of town on 8/17 when RT/chemo starts. She insists that she be in the office for the appointment and if she is not able to come, then neither will her father. Reports he is very HOH and does not understand much of what he is told. Informed her that I will forward her request to the DON, [facility]. He will cancel the lab at [facility] on 8/17 since the same labs will be done here for his chemo tx. High priority scheduling message sent with changes requested. Informed daughter that she is still not allowed in office since he is alert and oriented. She agrees to a WebEx visit with email address: [email or web address]. Notified Beverly in scheduling.

## 2019-02-14 NOTE — Progress Notes (Signed)
Patient has been granted special permission to have his daughter, [name], accompany him to visit/treatments within the [facility].  Nicholos Johns, MMS, PA-C [facility] [place]: [phone]  Fax: [phone]

## 2019-02-14 NOTE — Telephone Encounter (Signed)
Called patient regarding upcoming Webex appointments, completed a test run with patient's daughter and an e-mail has been sent.

## 2019-02-15 ENCOUNTER — Other Ambulatory Visit: Payer: Self-pay | Admitting: Oncology

## 2019-02-17 ENCOUNTER — Inpatient Hospital Stay: Payer: Medicare Other | Attending: Oncology | Admitting: Oncology

## 2019-02-17 ENCOUNTER — Other Ambulatory Visit: Payer: Medicare Other

## 2019-02-17 ENCOUNTER — Ambulatory Visit: Payer: Medicare Other

## 2019-02-17 ENCOUNTER — Telehealth: Payer: Self-pay | Admitting: *Deleted

## 2019-02-17 ENCOUNTER — Other Ambulatory Visit: Payer: Self-pay | Admitting: *Deleted

## 2019-02-17 DIAGNOSIS — K5900 Constipation, unspecified: Secondary | ICD-10-CM | POA: Insufficient documentation

## 2019-02-17 DIAGNOSIS — C3412 Malignant neoplasm of upper lobe, left bronchus or lung: Secondary | ICD-10-CM

## 2019-02-17 DIAGNOSIS — Z5111 Encounter for antineoplastic chemotherapy: Secondary | ICD-10-CM | POA: Insufficient documentation

## 2019-02-17 DIAGNOSIS — D701 Agranulocytosis secondary to cancer chemotherapy: Secondary | ICD-10-CM | POA: Insufficient documentation

## 2019-02-17 DIAGNOSIS — N400 Enlarged prostate without lower urinary tract symptoms: Secondary | ICD-10-CM | POA: Insufficient documentation

## 2019-02-17 DIAGNOSIS — T451X5A Adverse effect of antineoplastic and immunosuppressive drugs, initial encounter: Secondary | ICD-10-CM | POA: Insufficient documentation

## 2019-02-17 DIAGNOSIS — K208 Other esophagitis: Secondary | ICD-10-CM | POA: Insufficient documentation

## 2019-02-17 DIAGNOSIS — I255 Ischemic cardiomyopathy: Secondary | ICD-10-CM | POA: Diagnosis not present

## 2019-02-17 DIAGNOSIS — Z85038 Personal history of other malignant neoplasm of large intestine: Secondary | ICD-10-CM | POA: Insufficient documentation

## 2019-02-17 DIAGNOSIS — R590 Localized enlarged lymph nodes: Secondary | ICD-10-CM | POA: Insufficient documentation

## 2019-02-17 DIAGNOSIS — G252 Other specified forms of tremor: Secondary | ICD-10-CM | POA: Insufficient documentation

## 2019-02-17 DIAGNOSIS — J449 Chronic obstructive pulmonary disease, unspecified: Secondary | ICD-10-CM | POA: Insufficient documentation

## 2019-02-17 DIAGNOSIS — Y842 Radiological procedure and radiotherapy as the cause of abnormal reaction of the patient, or of later complication, without mention of misadventure at the time of the procedure: Secondary | ICD-10-CM | POA: Insufficient documentation

## 2019-02-17 DIAGNOSIS — I2510 Atherosclerotic heart disease of native coronary artery without angina pectoris: Secondary | ICD-10-CM | POA: Insufficient documentation

## 2019-02-17 DIAGNOSIS — I4891 Unspecified atrial fibrillation: Secondary | ICD-10-CM | POA: Insufficient documentation

## 2019-02-17 MED ORDER — PROCHLORPERAZINE MALEATE 5 MG PO TABS: 5.0000 mg | ORAL_TABLET | Freq: Four times a day (QID) | ORAL | 0 refills | Status: DC | PRN

## 2019-02-17 MED ORDER — DEXAMETHASONE 2 MG PO TABS: 2.0000 mg | ORAL_TABLET | ORAL | 0 refills | Status: DC

## 2019-02-17 NOTE — Progress Notes (Signed)
[name] OFFICE VISIT PROGRESS NOTE  I connected with@ on 02/17/19 at 11:00 AM EDT by video/telephone and verified that I am speaking with the correct person using two identifiers.   I discussed the limitations, risks, security and privacy concerns of performing an evaluation and management service by telemedicine and the availability of in-person appointments. I also discussed with the patient that there may be a patient responsible charge related to this service. The patient expressed understanding and agreed to proceed.  Other persons participating in the visit and their role in the encounter: Daughter  Patient's location: Home Provider's location: Office    Diagnosis: Non-small cell lung cancer  INTERVAL HISTORY:   [name] is seen today for a telehealth visit.  The visit started as a video visit, but we converted to a telephone visit due to technical difficulty with [name] computer microphone.  He is scheduled to begin concurrent chemotherapy and radiation on 02/24/2019.  The staging PET scan confirmed a hypermetabolic left upper lobe mass and hypermetabolic mediastinal/left hilar adenopathy.  No evidence of distant metastatic disease.  A brain MRI revealed no evidence of metastatic disease.  Lab Results:  Lab Results  Component Value Date   WBC 7.0 02/03/2019   HGB 13.1 02/03/2019   HCT 40.9 02/03/2019   MCV 92.3 02/03/2019   PLT 184 02/03/2019   NEUTROABS 4.8 02/03/2019     Medications: I have reviewed the patient's current medications.  Assessment/Plan: 1. Sigmoid colon cancer, stage II (T3N0), status post a sigmoidectomy 12/26/2017 ? Tumor involving the sigmoid colon and proximal rectum ? MSI-stable, no loss of mismatch repair protein expression ? Staging CTs of the chest, abdomen, and pelvis on 11/20/2017- focal wall thickening at the distal sigmoid colon, no evidence of metastatic disease, indeterminate  1.9 cm sub-solid left upper lobe nodule 2. Coronary artery disease 3. Atrial fibrillation 4. BPH 5. Left arm/hand intention tremor 6. Report of balance difficulty 7. COPD 8. Indeterminate left upper lobe nodule on the staging chest CT 11/20/2017  Increase in part solid nodule in the left upper lobe, stable groundglass nodule in the superior segment of the left lower lobe  CT biopsy- adenocarcinoma consistent with lung primary, MSS, tumor mutation burden 13, BRAF [id number], PDL-1 TPS 5%. No EGFR, ALK, ERBB2, ROS alteratation  SBRT 08/14/2018- 08/23/2018, 5 fractions  CT chest 10/23/2018 -2.6 x 1.7 cm left upper lobe nodule-enlarged, 13 mm left suprahilar node-possibly new, 9 mm AP window node previously 6 mm  CT chest 01/23/2019- new mediastinal and progressive hilar adenopathy,  PET scan 02/03/2019-hypermetabolic mediastinal and left hilar lymph nodes, hypermetabolic left upper lobe mass, no evidence of distant metastatic disease  MRI brain 02/12/2019- no metastatic disease 9. Renal insufficiency     Disposition: [name] has been diagnosed with clinical stage III non-small cell lung cancer.  He has progression of a left upper lobe mass after SBRT treatment earlier this year.  There is no evidence of distant metastatic disease.  [name] agrees to proceed with concurrent weekly Taxol/carboplatin radiation.  We reviewed potential toxicities associated with the chemotherapy regimen again today.  He agrees to proceed.  He will take home Decadron prior to the first treatment with Taxol.  He will be seen for an office visit on 03/03/2019.   I discussed the assessment and treatment plan with the patient. The patient was provided an opportunity to ask questions and all were answered. The patient agreed with the plan and  demonstrated an understanding of the instructions.   The patient was advised to call back or seek an in-person evaluation if the symptoms worsen or if the condition fails to  improve as anticipated.  I provided 25 minutes of video/telephone, chart review, and documentation time during this encounter, and > 50% was spent counseling as documented under my assessment & plan.  Betsy Coder ANP/GNP-BC   02/17/2019 11:52 AM

## 2019-02-17 NOTE — Telephone Encounter (Addendum)
Called with a question she forgot to ask Dr. Benay Spice when they were talking to him today. Scheduled for prep work appointment on 8/13 w/oral surgeon to prepare for a bridge. Will not have the bridge for several weeks after the prep visit. Asking if there are any restrictions on this during RT/chemo? Should they just delay the bridgework till after RT/chemo is completed? Per Dr. Benay Spice: OK to proceed as long as counts are OK. Would also be reasonable to postpone the procedure until after his RT/chemo are completed if the bridgework is not urgent. Left this response on daughter's voice mail.

## 2019-02-17 NOTE — Progress Notes (Signed)
Per Dr. Benay Spice: Needs Decadron premed for Taxol and prn compazine. Scripts sent to [facility]

## 2019-02-19 ENCOUNTER — Telehealth: Payer: Self-pay | Admitting: *Deleted

## 2019-02-19 NOTE — Telephone Encounter (Signed)
Daughter called to obtain appointment information for dates/times of his chemo. This was provided and a calendar will be printed for patient on Monday. Missing chemo appointment on 8/24 and message sent to scheduler to have this added. Daughter again INSISTING that she be present for appointments despite nurse telling her the policy and rationale. She is insisting on talking to the person in charge of this decision. Will make medical director aware of her request. Prior to ending of conversation made her aware the decadron and compazine have been called in and reviewed directions.

## 2019-02-20 ENCOUNTER — Telehealth: Payer: Self-pay | Admitting: Oncology

## 2019-02-20 DIAGNOSIS — Z510 Encounter for antineoplastic radiation therapy: Secondary | ICD-10-CM | POA: Diagnosis not present

## 2019-02-20 DIAGNOSIS — C3412 Malignant neoplasm of upper lobe, left bronchus or lung: Secondary | ICD-10-CM | POA: Diagnosis not present

## 2019-02-20 NOTE — Telephone Encounter (Signed)
Called and left detailed msg on daughters voicemail. Printout will be provided at 8/17 appt

## 2019-02-21 ENCOUNTER — Telehealth: Payer: Self-pay | Admitting: *Deleted

## 2019-02-24 ENCOUNTER — Other Ambulatory Visit: Payer: Self-pay

## 2019-02-24 ENCOUNTER — Ambulatory Visit
Admission: RE | Admit: 2019-02-24 | Discharge: 2019-02-24 | Disposition: A | Discharge status: Home / Self Care | Payer: Medicare Other | Source: Ambulatory Visit | Attending: Radiation Oncology | Admitting: Radiation Oncology

## 2019-02-24 ENCOUNTER — Inpatient Hospital Stay: Payer: Medicare Other

## 2019-02-24 ENCOUNTER — Other Ambulatory Visit: Payer: Medicare Other

## 2019-02-24 VITALS — BP 170/64 | HR 48 | Temp 97.5°F | Resp 18 | Ht 71.0 in | Wt 165.8 lb

## 2019-02-24 DIAGNOSIS — D701 Agranulocytosis secondary to cancer chemotherapy: Secondary | ICD-10-CM | POA: Diagnosis not present

## 2019-02-24 DIAGNOSIS — I4891 Unspecified atrial fibrillation: Secondary | ICD-10-CM | POA: Diagnosis not present

## 2019-02-24 DIAGNOSIS — K208 Other esophagitis: Secondary | ICD-10-CM | POA: Diagnosis not present

## 2019-02-24 DIAGNOSIS — G252 Other specified forms of tremor: Secondary | ICD-10-CM | POA: Diagnosis not present

## 2019-02-24 DIAGNOSIS — Y842 Radiological procedure and radiotherapy as the cause of abnormal reaction of the patient, or of later complication, without mention of misadventure at the time of the procedure: Secondary | ICD-10-CM | POA: Diagnosis not present

## 2019-02-24 DIAGNOSIS — R590 Localized enlarged lymph nodes: Secondary | ICD-10-CM | POA: Diagnosis not present

## 2019-02-24 DIAGNOSIS — N400 Enlarged prostate without lower urinary tract symptoms: Secondary | ICD-10-CM | POA: Diagnosis not present

## 2019-02-24 DIAGNOSIS — C3412 Malignant neoplasm of upper lobe, left bronchus or lung: Secondary | ICD-10-CM

## 2019-02-24 DIAGNOSIS — K5900 Constipation, unspecified: Secondary | ICD-10-CM | POA: Diagnosis not present

## 2019-02-24 DIAGNOSIS — Z5111 Encounter for antineoplastic chemotherapy: Secondary | ICD-10-CM | POA: Diagnosis not present

## 2019-02-24 DIAGNOSIS — I2510 Atherosclerotic heart disease of native coronary artery without angina pectoris: Secondary | ICD-10-CM | POA: Diagnosis not present

## 2019-02-24 DIAGNOSIS — I10 Essential (primary) hypertension: Secondary | ICD-10-CM

## 2019-02-24 DIAGNOSIS — Z85038 Personal history of other malignant neoplasm of large intestine: Secondary | ICD-10-CM | POA: Diagnosis not present

## 2019-02-24 DIAGNOSIS — T451X5A Adverse effect of antineoplastic and immunosuppressive drugs, initial encounter: Secondary | ICD-10-CM | POA: Diagnosis not present

## 2019-02-24 DIAGNOSIS — J449 Chronic obstructive pulmonary disease, unspecified: Secondary | ICD-10-CM | POA: Diagnosis not present

## 2019-02-24 DIAGNOSIS — Z510 Encounter for antineoplastic radiation therapy: Secondary | ICD-10-CM | POA: Diagnosis not present

## 2019-02-24 LAB — CBC WITH DIFFERENTIAL (CANCER CENTER ONLY)
Abs Immature Granulocytes: 0.04 10*3/uL (ref 0.00–0.07)
Basophils Absolute: 0 10*3/uL (ref 0.0–0.1)
Basophils Relative: 0 %
Eosinophils Absolute: 0 10*3/uL (ref 0.0–0.5)
Eosinophils Relative: 0 %
HCT: 41.2 % (ref 39.0–52.0)
Hemoglobin: 13.7 g/dL (ref 13.0–17.0)
Immature Granulocytes: 1 %
Lymphocytes Relative: 8 %
Lymphs Abs: 0.6 10*3/uL — ABNORMAL LOW (ref 0.7–4.0)
MCH: 29.8 pg (ref 26.0–34.0)
MCHC: 33.3 g/dL (ref 30.0–36.0)
MCV: 89.6 fL (ref 80.0–100.0)
Monocytes Absolute: 0 10*3/uL — ABNORMAL LOW (ref 0.1–1.0)
Monocytes Relative: 1 %
Neutro Abs: 6.2 10*3/uL (ref 1.7–7.7)
Neutrophils Relative %: 90 %
Platelet Count: 183 10*3/uL (ref 150–400)
RBC: 4.6 MIL/uL (ref 4.22–5.81)
RDW: 13.8 % (ref 11.5–15.5)
WBC Count: 6.9 10*3/uL (ref 4.0–10.5)
nRBC: 0 % (ref 0.0–0.2)

## 2019-02-24 LAB — CMP (CANCER CENTER ONLY)
ALT: 29 U/L (ref 0–44)
AST: 22 U/L (ref 15–41)
Albumin: 3.6 g/dL (ref 3.5–5.0)
Alkaline Phosphatase: 126 U/L (ref 38–126)
Anion gap: 12 (ref 5–15)
BUN: 29 mg/dL — ABNORMAL HIGH (ref 8–23)
CO2: 21 mmol/L — ABNORMAL LOW (ref 22–32)
Calcium: 9.3 mg/dL (ref 8.9–10.3)
Chloride: 105 mmol/L (ref 98–111)
Creatinine: 1.38 mg/dL — ABNORMAL HIGH (ref 0.61–1.24)
GFR, Est AFR Am: 53 mL/min — ABNORMAL LOW (ref >60)
GFR, Estimated: 46 mL/min — ABNORMAL LOW (ref >60)
Glucose, Bld: 238 mg/dL — ABNORMAL HIGH (ref 70–99)
Potassium: 4.4 mmol/L (ref 3.5–5.1)
Sodium: 138 mmol/L (ref 135–145)
Total Bilirubin: 1.2 mg/dL (ref 0.3–1.2)
Total Protein: 7.2 g/dL (ref 6.5–8.1)

## 2019-02-24 MED ORDER — SODIUM CHLORIDE 0.9 % IV SOLN
Freq: Once | INTRAVENOUS | Status: AC
  Administered 2019-02-24: 09:00:00 via INTRAVENOUS
  Filled 2019-02-24: qty 250

## 2019-02-24 MED ORDER — SODIUM CHLORIDE 0.9 % IV SOLN
20.0000 mg | Freq: Once | INTRAVENOUS | Status: AC
  Administered 2019-02-24: 20 mg via INTRAVENOUS
  Filled 2019-02-24: qty 20

## 2019-02-24 MED ORDER — FAMOTIDINE IN NACL 20-0.9 MG/50ML-% IV SOLN
20.0000 mg | Freq: Once | INTRAVENOUS | Status: AC
  Administered 2019-02-24: 10:00:00 20 mg via INTRAVENOUS

## 2019-02-24 MED ORDER — CLONIDINE HCL 0.1 MG PO TABS
ORAL_TABLET | ORAL | Status: AC
  Filled 2019-02-24: qty 1

## 2019-02-24 MED ORDER — SODIUM CHLORIDE 0.9 % IV SOLN
131.2000 mg | Freq: Once | INTRAVENOUS | Status: AC
  Administered 2019-02-24: 130 mg via INTRAVENOUS
  Filled 2019-02-24: qty 13

## 2019-02-24 MED ORDER — PALONOSETRON HCL INJECTION 0.25 MG/5ML
INTRAVENOUS | Status: AC
  Filled 2019-02-24: qty 5

## 2019-02-24 MED ORDER — DIPHENHYDRAMINE HCL 50 MG/ML IJ SOLN
25.0000 mg | Freq: Once | INTRAMUSCULAR | Status: AC
  Administered 2019-02-24: 25 mg via INTRAVENOUS

## 2019-02-24 MED ORDER — DIPHENHYDRAMINE HCL 50 MG/ML IJ SOLN
INTRAMUSCULAR | Status: AC
  Filled 2019-02-24: qty 1

## 2019-02-24 MED ORDER — PALONOSETRON HCL INJECTION 0.25 MG/5ML
0.2500 mg | Freq: Once | INTRAVENOUS | Status: AC
  Administered 2019-02-24: 09:00:00 0.25 mg via INTRAVENOUS

## 2019-02-24 MED ORDER — CLONIDINE HCL 0.1 MG PO TABS
0.1000 mg | ORAL_TABLET | Freq: Once | ORAL | Status: AC
  Administered 2019-02-24: 0.1 mg via ORAL

## 2019-02-24 MED ORDER — FAMOTIDINE IN NACL 20-0.9 MG/50ML-% IV SOLN
INTRAVENOUS | Status: AC
  Filled 2019-02-24: qty 50

## 2019-02-24 MED ORDER — SODIUM CHLORIDE 0.9 % IV SOLN
45.0000 mg/m2 | Freq: Once | INTRAVENOUS | Status: AC
  Administered 2019-02-24: 90 mg via INTRAVENOUS
  Filled 2019-02-24: qty 15

## 2019-02-24 NOTE — Progress Notes (Signed)
11:25 While obtaining pt's vitals for his 2nd Taxol increase, observed that pt's BP was steadily increasing. Pt denied any chest pain, SOB, headache, or other symptoms, and stated that he has taken his morning BP medications. Paused infusion and called [facility] Client with update. Received orders to administer 0.1 mg Clonidine PO and continue with Taxol infusion. Orders placed and medication administered at 11:35.  Will continue to monitor  Pt able to complete Taxol and Carboplatin infusions. BP came back down closer to baseline. Pt again denied any chest pain, SOB, headache, or other symptoms. Ambulated out of clinic without issue.

## 2019-02-24 NOTE — Patient Instructions (Addendum)
[facility] Discharge Instructions for Patients Receiving Chemotherapy:  **For nausea take Compazine 5 mg every 6 hours as needed**Take a dose at bedtime tonight and then as needed. Call if not effective. Drink plenty of fluids and avoid greasy or spicy foods for few days after treatment.  Today you received the following chemotherapy agents: Paclitaxel (Taxol) and Carboplatin (Paraplatin)  To help prevent nausea and vomiting after your treatment, we encourage you to take your nausea medication as directed.   If you develop nausea and vomiting that is not controlled by your nausea medication, call the clinic.   BELOW ARE SYMPTOMS THAT SHOULD BE REPORTED IMMEDIATELY:  *FEVER GREATER THAN 100.5 F  *CHILLS WITH OR WITHOUT FEVER  NAUSEA AND VOMITING THAT IS NOT CONTROLLED WITH YOUR NAUSEA MEDICATION  *UNUSUAL SHORTNESS OF BREATH  *UNUSUAL BRUISING OR BLEEDING  TENDERNESS IN MOUTH AND THROAT WITH OR WITHOUT PRESENCE OF ULCERS  *URINARY PROBLEMS  *BOWEL PROBLEMS  UNUSUAL RASH Items with * indicate a potential emergency and should be followed up as soon as possible.  Feel free to call the clinic should you have any questions or concerns. The clinic phone number is [phone].  Please show the [facility] at check-in to the Emergency Department and triage nurse.  Paclitaxel injection What is this medicine? PACLITAXEL (PAK li TAX el) is a chemotherapy drug. It targets fast dividing cells, like cancer cells, and causes these cells to die. This medicine is used to treat ovarian cancer, breast cancer, lung cancer, Kaposi's sarcoma, and other cancers. This medicine may be used for other purposes; ask your health care provider or pharmacist if you have questions. COMMON BRAND NAME(S): Onxol, Taxol What should I tell my health care provider before I take this medicine? They need to know if you have any of these conditions:  history of irregular heartbeat  liver  disease  low blood counts, like low white cell, platelet, or red cell counts  lung or breathing disease, like asthma  tingling of the fingers or toes, or other nerve disorder  an unusual or allergic reaction to paclitaxel, alcohol, polyoxyethylated castor oil, other chemotherapy, other medicines, foods, dyes, or preservatives  pregnant or trying to get pregnant  breast-feeding How should I use this medicine? This drug is given as an infusion into a vein. It is administered in a hospital or clinic by a specially trained health care professional. Talk to your pediatrician regarding the use of this medicine in children. Special care may be needed. Overdosage: If you think you have taken too much of this medicine contact a poison control center or emergency room at once. NOTE: This medicine is only for you. Do not share this medicine with others. What if I miss a dose? It is important not to miss your dose. Call your doctor or health care professional if you are unable to keep an appointment. What may interact with this medicine? Do not take this medicine with any of the following medications:  disulfiram  metronidazole This medicine may also interact with the following medications:  antiviral medicines for hepatitis, HIV or AIDS  certain antibiotics like erythromycin and clarithromycin  certain medicines for fungal infections like ketoconazole and itraconazole  certain medicines for seizures like carbamazepine, phenobarbital, phenytoin  gemfibrozil  nefazodone  rifampin  St. John's wort This list may not describe all possible interactions. Give your health care provider a list of all the medicines, herbs, non-prescription drugs, or dietary supplements you use. Also tell  them if you smoke, drink alcohol, or use illegal drugs. Some items may interact with your medicine. What should I watch for while using this medicine? Your condition will be monitored carefully while you are  receiving this medicine. You will need important blood work done while you are taking this medicine. This medicine can cause serious allergic reactions. To reduce your risk you will need to take other medicine(s) before treatment with this medicine. If you experience allergic reactions like skin rash, itching or hives, swelling of the face, lips, or tongue, tell your doctor or health care professional right away. In some cases, you may be given additional medicines to help with side effects. Follow all directions for their use. This drug may make you feel generally unwell. This is not uncommon, as chemotherapy can affect healthy cells as well as cancer cells. Report any side effects. Continue your course of treatment even though you feel ill unless your doctor tells you to stop. Call your doctor or health care professional for advice if you get a fever, chills or sore throat, or other symptoms of a cold or flu. Do not treat yourself. This drug decreases your body's ability to fight infections. Try to avoid being around people who are sick. This medicine may increase your risk to bruise or bleed. Call your doctor or health care professional if you notice any unusual bleeding. Be careful brushing and flossing your teeth or using a toothpick because you may get an infection or bleed more easily. If you have any dental work done, tell your dentist you are receiving this medicine. Avoid taking products that contain aspirin, acetaminophen, ibuprofen, naproxen, or ketoprofen unless instructed by your doctor. These medicines may hide a fever. Do not become pregnant while taking this medicine. Women should inform their doctor if they wish to become pregnant or think they might be pregnant. There is a potential for serious side effects to an unborn child. Talk to your health care professional or pharmacist for more information. Do not breast-feed an infant while taking this medicine. Men are advised not to father a  child while receiving this medicine. This product may contain alcohol. Ask your pharmacist or healthcare provider if this medicine contains alcohol. Be sure to tell all healthcare providers you are taking this medicine. Certain medicines, like metronidazole and disulfiram, can cause an unpleasant reaction when taken with alcohol. The reaction includes flushing, headache, nausea, vomiting, sweating, and increased thirst. The reaction can last from 30 minutes to several hours. What side effects may I notice from receiving this medicine? Side effects that you should report to your doctor or health care professional as soon as possible:  allergic reactions like skin rash, itching or hives, swelling of the face, lips, or tongue  breathing problems  changes in vision  fast, irregular heartbeat  high or low blood pressure  mouth sores  pain, tingling, numbness in the hands or feet  signs of decreased platelets or bleeding - bruising, pinpoint red spots on the skin, black, tarry stools, blood in the urine  signs of decreased red blood cells - unusually weak or tired, feeling faint or lightheaded, falls  signs of infection - fever or chills, cough, sore throat, pain or difficulty passing urine  signs and symptoms of liver injury like dark yellow or brown urine; general ill feeling or flu-like symptoms; light-colored stools; loss of appetite; nausea; right upper belly pain; unusually weak or tired; yellowing of the eyes or skin  swelling of the ankles,  feet, hands  unusually slow heartbeat Side effects that usually do not require medical attention (report to your doctor or health care professional if they continue or are bothersome):  diarrhea  hair loss  loss of appetite  muscle or joint pain  nausea, vomiting  pain, redness, or irritation at site where injected  tiredness This list may not describe all possible side effects. Call your doctor for medical advice about side effects.  You may report side effects to FDA at 1-800-FDA-1088. Where should I keep my medicine? This drug is given in a hospital or clinic and will not be stored at home. NOTE: This sheet is a summary. It may not cover all possible information. If you have questions about this medicine, talk to your doctor, pharmacist, or health care provider.  2020 Elsevier/Gold Standard (2017-02-27 13:14:55)  Carboplatin injection What is this medicine? CARBOPLATIN (KAR boe pla tin) is a chemotherapy drug. It targets fast dividing cells, like cancer cells, and causes these cells to die. This medicine is used to treat ovarian cancer and many other cancers. This medicine may be used for other purposes; ask your health care provider or pharmacist if you have questions. COMMON BRAND NAME(S): Paraplatin What should I tell my health care provider before I take this medicine? They need to know if you have any of these conditions:  blood disorders  hearing problems  kidney disease  recent or ongoing radiation therapy  an unusual or allergic reaction to carboplatin, cisplatin, other chemotherapy, other medicines, foods, dyes, or preservatives  pregnant or trying to get pregnant  breast-feeding How should I use this medicine? This drug is usually given as an infusion into a vein. It is administered in a hospital or clinic by a specially trained health care professional. Talk to your pediatrician regarding the use of this medicine in children. Special care may be needed. Overdosage: If you think you have taken too much of this medicine contact a poison control center or emergency room at once. NOTE: This medicine is only for you. Do not share this medicine with others. What if I miss a dose? It is important not to miss a dose. Call your doctor or health care professional if you are unable to keep an appointment. What may interact with this medicine?  medicines for seizures  medicines to increase blood counts  like filgrastim, pegfilgrastim, sargramostim  some antibiotics like amikacin, gentamicin, neomycin, streptomycin, tobramycin  vaccines Talk to your doctor or health care professional before taking any of these medicines:  acetaminophen  aspirin  ibuprofen  ketoprofen  naproxen This list may not describe all possible interactions. Give your health care provider a list of all the medicines, herbs, non-prescription drugs, or dietary supplements you use. Also tell them if you smoke, drink alcohol, or use illegal drugs. Some items may interact with your medicine. What should I watch for while using this medicine? Your condition will be monitored carefully while you are receiving this medicine. You will need important blood work done while you are taking this medicine. This drug may make you feel generally unwell. This is not uncommon, as chemotherapy can affect healthy cells as well as cancer cells. Report any side effects. Continue your course of treatment even though you feel ill unless your doctor tells you to stop. In some cases, you may be given additional medicines to help with side effects. Follow all directions for their use. Call your doctor or health care professional for advice if you get a fever,  chills or sore throat, or other symptoms of a cold or flu. Do not treat yourself. This drug decreases your body's ability to fight infections. Try to avoid being around people who are sick. This medicine may increase your risk to bruise or bleed. Call your doctor or health care professional if you notice any unusual bleeding. Be careful brushing and flossing your teeth or using a toothpick because you may get an infection or bleed more easily. If you have any dental work done, tell your dentist you are receiving this medicine. Avoid taking products that contain aspirin, acetaminophen, ibuprofen, naproxen, or ketoprofen unless instructed by your doctor. These medicines may hide a fever. Do not  become pregnant while taking this medicine. Women should inform their doctor if they wish to become pregnant or think they might be pregnant. There is a potential for serious side effects to an unborn child. Talk to your health care professional or pharmacist for more information. Do not breast-feed an infant while taking this medicine. What side effects may I notice from receiving this medicine? Side effects that you should report to your doctor or health care professional as soon as possible:  allergic reactions like skin rash, itching or hives, swelling of the face, lips, or tongue  signs of infection - fever or chills, cough, sore throat, pain or difficulty passing urine  signs of decreased platelets or bleeding - bruising, pinpoint red spots on the skin, black, tarry stools, nosebleeds  signs of decreased red blood cells - unusually weak or tired, fainting spells, lightheadedness  breathing problems  changes in hearing  changes in vision  chest pain  high blood pressure  low blood counts - This drug may decrease the number of white blood cells, red blood cells and platelets. You may be at increased risk for infections and bleeding.  nausea and vomiting  pain, swelling, redness or irritation at the injection site  pain, tingling, numbness in the hands or feet  problems with balance, talking, walking  trouble passing urine or change in the amount of urine Side effects that usually do not require medical attention (report to your doctor or health care professional if they continue or are bothersome):  hair loss  loss of appetite  metallic taste in the mouth or changes in taste This list may not describe all possible side effects. Call your doctor for medical advice about side effects. You may report side effects to FDA at 1-800-FDA-1088. Where should I keep my medicine? This drug is given in a hospital or clinic and will not be stored at home. NOTE: This sheet is a  summary. It may not cover all possible information. If you have questions about this medicine, talk to your doctor, pharmacist, or health care provider.  2020 Elsevier/Gold Standard (2007-10-01 14:38:05)  Coronavirus (COVID-19) Are you at risk?  Are you at risk for the Coronavirus (COVID-19)?  To be considered HIGH RISK for Coronavirus (COVID-19), you have to meet the following criteria:  . Traveled to [place], [place], [place], [place] or [place]; or in the [place] to [place], [place], [place], or [place]; and have fever, cough, and shortness of breath within the last 2 weeks of travel OR . Been in close contact with a person diagnosed with COVID-19 within the last 2 weeks and have fever, cough, and shortness of breath . IF YOU DO NOT MEET THESE CRITERIA, YOU ARE CONSIDERED LOW RISK FOR COVID-19.  What to do if you are HIGH RISK  for COVID-19?  Marland Kitchen If you are having a medical emergency, call 911. . Seek medical care right away. Before you go to a doctor's office, urgent care or emergency department, call ahead and tell them about your recent travel, contact with someone diagnosed with COVID-19, and your symptoms. You should receive instructions from your physician's office regarding next steps of care.  . When you arrive at healthcare provider, tell the healthcare staff immediately you have returned from visiting [place], [place], [place], [place] or [place]; or traveled in the [place] to [place], [place], [place], or [place]; in the last two weeks or you have been in close contact with a person diagnosed with COVID-19 in the last 2 weeks.   . Tell the health care staff about your symptoms: fever, cough and shortness of breath. . After you have been seen by a medical provider, you will be either: o Tested for (COVID-19) and discharged home on quarantine except to seek medical care if symptoms worsen, and asked to  - Stay home and avoid contact with others until you get  your results (4-5 days)  - Avoid travel on public transportation if possible (such as bus, train, or airplane) or o Sent to the Emergency Department by EMS for evaluation, COVID-19 testing, and possible admission depending on your condition and test results.  What to do if you are LOW RISK for COVID-19?  Reduce your risk of any infection by using the same precautions used for avoiding the common cold or flu:  Marland Kitchen Wash your hands often with soap and warm water for at least 20 seconds.  If soap and water are not readily available, use an alcohol-based hand sanitizer with at least 60% alcohol.  . If coughing or sneezing, cover your mouth and nose by coughing or sneezing into the elbow areas of your shirt or coat, into a tissue or into your sleeve (not your hands). . Avoid shaking hands with others and consider head nods or verbal greetings only. . Avoid touching your eyes, nose, or mouth with unwashed hands.  . Avoid close contact with people who are sick. . Avoid places or events with large numbers of people in one location, like concerts or sporting events. . Carefully consider travel plans you have or are making. . If you are planning any travel outside or inside the [place], visit the CDC's Travelers' Health webpage for the latest health notices. . If you have some symptoms but not all symptoms, continue to monitor at home and seek medical attention if your symptoms worsen. . If you are having a medical emergency, call 911.   [place] / e-Visit: [email or web address]         [facility]: [facility]: [phone]                   [facility]: [phone]

## 2019-02-25 ENCOUNTER — Other Ambulatory Visit: Payer: Self-pay

## 2019-02-25 ENCOUNTER — Telehealth: Payer: Self-pay | Admitting: *Deleted

## 2019-02-25 ENCOUNTER — Telehealth: Payer: Self-pay | Admitting: Pharmacist

## 2019-02-25 ENCOUNTER — Ambulatory Visit
Admission: RE | Admit: 2019-02-25 | Discharge: 2019-02-25 | Disposition: A | Discharge status: Home / Self Care | Payer: Medicare Other | Source: Ambulatory Visit | Attending: Radiation Oncology | Admitting: Radiation Oncology

## 2019-02-25 DIAGNOSIS — Z510 Encounter for antineoplastic radiation therapy: Secondary | ICD-10-CM | POA: Diagnosis not present

## 2019-02-25 DIAGNOSIS — C3412 Malignant neoplasm of upper lobe, left bronchus or lung: Secondary | ICD-10-CM | POA: Diagnosis not present

## 2019-02-25 NOTE — Telephone Encounter (Signed)
Scr is stable over the last 3 weeks and is <1.5. Called patient and advised that we will keep his Eliquis dose the same 5mg  BID.

## 2019-02-26 ENCOUNTER — Ambulatory Visit
Admission: RE | Admit: 2019-02-26 | Discharge: 2019-02-26 | Disposition: A | Discharge status: Home / Self Care | Payer: Medicare Other | Source: Ambulatory Visit | Attending: Radiation Oncology | Admitting: Radiation Oncology

## 2019-02-26 ENCOUNTER — Other Ambulatory Visit: Payer: Self-pay

## 2019-02-26 DIAGNOSIS — Z510 Encounter for antineoplastic radiation therapy: Secondary | ICD-10-CM | POA: Diagnosis not present

## 2019-02-26 DIAGNOSIS — C3412 Malignant neoplasm of upper lobe, left bronchus or lung: Secondary | ICD-10-CM | POA: Diagnosis not present

## 2019-02-27 ENCOUNTER — Other Ambulatory Visit: Payer: Self-pay

## 2019-02-27 ENCOUNTER — Ambulatory Visit
Admission: RE | Admit: 2019-02-27 | Discharge: 2019-02-27 | Disposition: A | Discharge status: Home / Self Care | Payer: Medicare Other | Source: Ambulatory Visit | Attending: Radiation Oncology | Admitting: Radiation Oncology

## 2019-02-27 DIAGNOSIS — Z510 Encounter for antineoplastic radiation therapy: Secondary | ICD-10-CM | POA: Diagnosis not present

## 2019-02-27 DIAGNOSIS — C3412 Malignant neoplasm of upper lobe, left bronchus or lung: Secondary | ICD-10-CM | POA: Diagnosis not present

## 2019-02-28 ENCOUNTER — Ambulatory Visit
Admission: RE | Admit: 2019-02-28 | Discharge: 2019-02-28 | Disposition: A | Discharge status: Home / Self Care | Payer: Medicare Other | Source: Ambulatory Visit | Attending: Radiation Oncology | Admitting: Radiation Oncology

## 2019-02-28 ENCOUNTER — Other Ambulatory Visit: Payer: Self-pay

## 2019-02-28 DIAGNOSIS — C3412 Malignant neoplasm of upper lobe, left bronchus or lung: Secondary | ICD-10-CM

## 2019-02-28 DIAGNOSIS — Z510 Encounter for antineoplastic radiation therapy: Secondary | ICD-10-CM | POA: Diagnosis not present

## 2019-02-28 MED ORDER — SONAFINE EX EMUL
1.0000 "application " | Freq: Two times a day (BID) | CUTANEOUS | Status: DC
  Administered 2019-02-28: 1 via TOPICAL

## 2019-02-28 NOTE — Progress Notes (Signed)
Pt here for patient teaching.  Pt given Radiation and You booklet and Sonafine.  Reviewed areas of pertinence such as fatigue, hair loss, mouth changes, skin changes, throat changes, cough and shortness of breath . Pt able to give teach back of to pat skin, use unscented/gentle soap and drink plenty of water,apply Sonafine bid and avoid applying anything to skin within 4 hours of treatment. Pt verbalizes understanding of information given and will contact nursing with any questions or concerns.  Daughter was present for education.  Gloriajean Dell. Leonie Green, BSN

## 2019-03-02 ENCOUNTER — Other Ambulatory Visit: Payer: Self-pay | Admitting: Oncology

## 2019-03-03 ENCOUNTER — Ambulatory Visit: Payer: Medicare Other | Admitting: Nurse Practitioner

## 2019-03-03 ENCOUNTER — Other Ambulatory Visit: Payer: Self-pay

## 2019-03-03 ENCOUNTER — Ambulatory Visit
Admission: RE | Admit: 2019-03-03 | Discharge: 2019-03-03 | Disposition: A | Discharge status: Home / Self Care | Payer: Medicare Other | Source: Ambulatory Visit | Attending: Radiation Oncology | Admitting: Radiation Oncology

## 2019-03-03 ENCOUNTER — Other Ambulatory Visit: Payer: Medicare Other

## 2019-03-03 DIAGNOSIS — C187 Malignant neoplasm of sigmoid colon: Secondary | ICD-10-CM

## 2019-03-03 DIAGNOSIS — Z510 Encounter for antineoplastic radiation therapy: Secondary | ICD-10-CM | POA: Diagnosis not present

## 2019-03-03 DIAGNOSIS — C3412 Malignant neoplasm of upper lobe, left bronchus or lung: Secondary | ICD-10-CM | POA: Diagnosis not present

## 2019-03-04 ENCOUNTER — Inpatient Hospital Stay: Payer: Medicare Other

## 2019-03-04 ENCOUNTER — Encounter: Payer: Self-pay | Admitting: Nurse Practitioner

## 2019-03-04 ENCOUNTER — Inpatient Hospital Stay (HOSPITAL_BASED_OUTPATIENT_CLINIC_OR_DEPARTMENT_OTHER): Payer: Medicare Other | Admitting: Nurse Practitioner

## 2019-03-04 ENCOUNTER — Other Ambulatory Visit: Payer: Self-pay

## 2019-03-04 ENCOUNTER — Ambulatory Visit
Admission: RE | Admit: 2019-03-04 | Discharge: 2019-03-04 | Disposition: A | Discharge status: Home / Self Care | Payer: Medicare Other | Source: Ambulatory Visit | Attending: Radiation Oncology | Admitting: Radiation Oncology

## 2019-03-04 VITALS — HR 54

## 2019-03-04 VITALS — BP 128/64 | HR 17 | Temp 98.0°F | Resp 17 | Ht 71.0 in | Wt 164.2 lb

## 2019-03-04 DIAGNOSIS — C3412 Malignant neoplasm of upper lobe, left bronchus or lung: Secondary | ICD-10-CM

## 2019-03-04 DIAGNOSIS — K5900 Constipation, unspecified: Secondary | ICD-10-CM | POA: Diagnosis not present

## 2019-03-04 DIAGNOSIS — C187 Malignant neoplasm of sigmoid colon: Secondary | ICD-10-CM

## 2019-03-04 DIAGNOSIS — I4891 Unspecified atrial fibrillation: Secondary | ICD-10-CM | POA: Diagnosis not present

## 2019-03-04 DIAGNOSIS — I255 Ischemic cardiomyopathy: Secondary | ICD-10-CM

## 2019-03-04 DIAGNOSIS — Z510 Encounter for antineoplastic radiation therapy: Secondary | ICD-10-CM | POA: Diagnosis not present

## 2019-03-04 DIAGNOSIS — Z85038 Personal history of other malignant neoplasm of large intestine: Secondary | ICD-10-CM | POA: Diagnosis not present

## 2019-03-04 DIAGNOSIS — I2510 Atherosclerotic heart disease of native coronary artery without angina pectoris: Secondary | ICD-10-CM | POA: Diagnosis not present

## 2019-03-04 DIAGNOSIS — Z5111 Encounter for antineoplastic chemotherapy: Secondary | ICD-10-CM | POA: Diagnosis not present

## 2019-03-04 LAB — CBC WITH DIFFERENTIAL (CANCER CENTER ONLY)
Abs Immature Granulocytes: 0.04 10*3/uL (ref 0.00–0.07)
Basophils Absolute: 0 10*3/uL (ref 0.0–0.1)
Basophils Relative: 1 %
Eosinophils Absolute: 0.2 10*3/uL (ref 0.0–0.5)
Eosinophils Relative: 4 %
HCT: 37 % — ABNORMAL LOW (ref 39.0–52.0)
Hemoglobin: 11.9 g/dL — ABNORMAL LOW (ref 13.0–17.0)
Immature Granulocytes: 1 %
Lymphocytes Relative: 20 %
Lymphs Abs: 0.8 10*3/uL (ref 0.7–4.0)
MCH: 29.2 pg (ref 26.0–34.0)
MCHC: 32.2 g/dL (ref 30.0–36.0)
MCV: 90.9 fL (ref 80.0–100.0)
Monocytes Absolute: 0.3 10*3/uL (ref 0.1–1.0)
Monocytes Relative: 8 %
Neutro Abs: 2.7 10*3/uL (ref 1.7–7.7)
Neutrophils Relative %: 66 %
Platelet Count: 181 10*3/uL (ref 150–400)
RBC: 4.07 MIL/uL — ABNORMAL LOW (ref 4.22–5.81)
RDW: 13.4 % (ref 11.5–15.5)
WBC Count: 4 10*3/uL (ref 4.0–10.5)
nRBC: 0 % (ref 0.0–0.2)

## 2019-03-04 LAB — CMP (CANCER CENTER ONLY)
ALT: 19 U/L (ref 0–44)
AST: 20 U/L (ref 15–41)
Albumin: 3.5 g/dL (ref 3.5–5.0)
Alkaline Phosphatase: 96 U/L (ref 38–126)
Anion gap: 9 (ref 5–15)
BUN: 24 mg/dL — ABNORMAL HIGH (ref 8–23)
CO2: 25 mmol/L (ref 22–32)
Calcium: 8.8 mg/dL — ABNORMAL LOW (ref 8.9–10.3)
Chloride: 106 mmol/L (ref 98–111)
Creatinine: 1.23 mg/dL (ref 0.61–1.24)
GFR, Est AFR Am: >60 mL/min (ref >60)
GFR, Estimated: 52 mL/min — ABNORMAL LOW (ref >60)
Glucose, Bld: 102 mg/dL — ABNORMAL HIGH (ref 70–99)
Potassium: 3.9 mmol/L (ref 3.5–5.1)
Sodium: 140 mmol/L (ref 135–145)
Total Bilirubin: 1.3 mg/dL — ABNORMAL HIGH (ref 0.3–1.2)
Total Protein: 6.6 g/dL (ref 6.5–8.1)

## 2019-03-04 MED ORDER — SODIUM CHLORIDE 0.9 % IV SOLN
Freq: Once | INTRAVENOUS | Status: AC
  Administered 2019-03-04: 12:00:00 via INTRAVENOUS
  Filled 2019-03-04: qty 250

## 2019-03-04 MED ORDER — PALONOSETRON HCL INJECTION 0.25 MG/5ML
INTRAVENOUS | Status: AC
  Filled 2019-03-04: qty 5

## 2019-03-04 MED ORDER — SODIUM CHLORIDE 0.9 % IV SOLN
141.0000 mg | Freq: Once | INTRAVENOUS | Status: AC
  Administered 2019-03-04: 14:00:00 140 mg via INTRAVENOUS
  Filled 2019-03-04: qty 14

## 2019-03-04 MED ORDER — DEXAMETHASONE SODIUM PHOSPHATE 10 MG/ML IJ SOLN
INTRAMUSCULAR | Status: AC
  Filled 2019-03-04: qty 1

## 2019-03-04 MED ORDER — FAMOTIDINE IN NACL 20-0.9 MG/50ML-% IV SOLN
INTRAVENOUS | Status: AC
  Filled 2019-03-04: qty 50

## 2019-03-04 MED ORDER — SODIUM CHLORIDE 0.9 % IV SOLN
45.0000 mg/m2 | Freq: Once | INTRAVENOUS | Status: AC
  Administered 2019-03-04: 90 mg via INTRAVENOUS
  Filled 2019-03-04: qty 15

## 2019-03-04 MED ORDER — DIPHENHYDRAMINE HCL 50 MG/ML IJ SOLN
INTRAMUSCULAR | Status: AC
  Filled 2019-03-04: qty 1

## 2019-03-04 MED ORDER — DIPHENHYDRAMINE HCL 50 MG/ML IJ SOLN
25.0000 mg | Freq: Once | INTRAMUSCULAR | Status: AC
  Administered 2019-03-04: 12:00:00 25 mg via INTRAVENOUS

## 2019-03-04 MED ORDER — PALONOSETRON HCL INJECTION 0.25 MG/5ML
0.2500 mg | Freq: Once | INTRAVENOUS | Status: AC
  Administered 2019-03-04: 0.25 mg via INTRAVENOUS

## 2019-03-04 MED ORDER — DEXAMETHASONE SODIUM PHOSPHATE 10 MG/ML IJ SOLN
10.0000 mg | Freq: Once | INTRAMUSCULAR | Status: AC
  Administered 2019-03-04: 12:00:00 10 mg via INTRAVENOUS

## 2019-03-04 MED ORDER — SODIUM CHLORIDE 0.9 % IV SOLN: 10.0000 mg | Freq: Once | INTRAVENOUS | Status: DC

## 2019-03-04 MED ORDER — FAMOTIDINE IN NACL 20-0.9 MG/50ML-% IV SOLN
20.0000 mg | Freq: Once | INTRAVENOUS | Status: AC
  Administered 2019-03-04: 20 mg via INTRAVENOUS

## 2019-03-04 NOTE — Progress Notes (Signed)
[facility] OFFICE PROGRESS NOTE   Diagnosis: Non-small cell lung cancer  INTERVAL HISTORY:   [name] returns as scheduled.  He began radiation 02/24/2019.  He completed cycle 1 weekly Taxol/carboplatin 02/24/2019.  He had a single episode of mild nausea a few days after chemotherapy.  He took his home nausea medication with relief.  No further episodes.  He had an ulcer on the left side of his tongue which has resolved.  He thinks this was likely not related to the chemotherapy as he has had similar ulcers in the past.  He is having some constipation.  He is taking MiraLAX.  He has stable numbness/tingling in the feet which predated the chemotherapy.  No signs of an allergic reaction.  Objective:  Vital signs in last 24 hours:  Blood pressure 128/64, pulse (!) 17, temperature 98 F (36.7 C), temperature source Oral, resp. rate 17, height _0  (1.803 m), weight 164 lb 3.2 oz (74.5 kg), SpO2 100 %.    HEENT: No thrush or ulcers. GI: Abdomen soft and nontender.  No hepatomegaly. Vascular: No leg edema. Neuro: Alert and oriented. Skin: No rash.   Lab Results:  Lab Results  Component Value Date   WBC 4.0 03/04/2019   HGB 11.9 (L) 03/04/2019   HCT 37.0 (L) 03/04/2019   MCV 90.9 03/04/2019   PLT 181 03/04/2019   NEUTROABS 2.7 03/04/2019    Imaging:  No results found.  Medications: I have reviewed the patient's current medications.  Assessment/Plan: 1. Sigmoid colon cancer, stage II (T3N0), status post a sigmoidectomy 12/26/2017 ? Tumor involving the sigmoid colon and proximal rectum ? MSI-stable, no loss of mismatch repair protein expression ? Staging CTs of the chest, abdomen, and pelvis on 11/20/2017- focal wall thickening at the distal sigmoid colon, no evidence of metastatic disease, indeterminate 1.9 cm sub-solid left upper lobe nodule 2. Coronary artery disease 3. Atrial fibrillation 4. BPH 5. Left arm/hand intention tremor 6. Report of balance  difficulty 7. COPD 8. Indeterminate left upper lobe nodule on the staging chest CT 11/20/2017  Increase in part solid nodule in the left upper lobe, stable groundglass nodule in the superior segment of the left lower lobe  CT biopsy- adenocarcinoma consistent with lung primary, MSS, tumor mutation burden 13, BRAF [id number], PDL-1 TPS 5%. No EGFR, ALK, ERBB2, ROS alteratation  SBRT 08/14/2018- 08/23/2018, 5 fractions  CT chest 10/23/2018 -2.6 x 1.7 cm left upper lobe nodule-enlarged, 13 mm left suprahilar node-possibly new, 9 mm AP window node previously 6 mm  CT chest 01/23/2019- new mediastinal and progressive hilar adenopathy,  PET scan 02/03/2019-hypermetabolic mediastinal and left hilar lymph nodes, hypermetabolic left upper lobe mass, no evidence of distant metastatic disease  MRI brain 02/12/2019- no metastatic disease  Radiation beginning 02/24/2019  Cycle 1 weekly Taxol/carboplatin 02/24/2019  Cycle 2 weekly Taxol/carboplatin 03/04/2019 9. Renal insufficiency   Disposition: [name] appears stable.  He continues radiation.  He tolerated cycle 1 weekly Taxol/carboplatin well.  Plan to proceed with cycle 2 today as scheduled.  We reviewed the CBC from today.  Counts adequate to proceed with treatment.  He will return for lab, cycle 3 weekly Taxol/carboplatin in 1 week.  We will see him prior to cycle 4 in 2 weeks.  He will contact the office in the interim with any problems.  I contacted his daughter by phone and reviewed the above.  Ned Card ANP/GNP-BC   03/04/2019  10:30 AM

## 2019-03-04 NOTE — Patient Instructions (Signed)
[facility] Discharge Instructions for Patients Receiving Chemotherapy:  **For nausea take Compazine 5 mg every 6 hours as needed**Take a dose at bedtime tonight and then as needed. Call if not effective. Drink plenty of fluids and avoid greasy or spicy foods for few days after treatment.  Today you received the following chemotherapy agents: Paclitaxel (Taxol) and Carboplatin (Paraplatin)  To help prevent nausea and vomiting after your treatment, we encourage you to take your nausea medication as directed.   If you develop nausea and vomiting that is not controlled by your nausea medication, call the clinic.   BELOW ARE SYMPTOMS THAT SHOULD BE REPORTED IMMEDIATELY:  *FEVER GREATER THAN 100.5 F  *CHILLS WITH OR WITHOUT FEVER  NAUSEA AND VOMITING THAT IS NOT CONTROLLED WITH YOUR NAUSEA MEDICATION  *UNUSUAL SHORTNESS OF BREATH  *UNUSUAL BRUISING OR BLEEDING  TENDERNESS IN MOUTH AND THROAT WITH OR WITHOUT PRESENCE OF ULCERS  *URINARY PROBLEMS  *BOWEL PROBLEMS  UNUSUAL RASH Items with * indicate a potential emergency and should be followed up as soon as possible.  Feel free to call the clinic should you have any questions or concerns. The clinic phone number is [phone].  Please show the [facility] at check-in to the Emergency Department and triage nurse.

## 2019-03-05 ENCOUNTER — Ambulatory Visit
Admission: RE | Admit: 2019-03-05 | Discharge: 2019-03-05 | Disposition: A | Discharge status: Home / Self Care | Payer: Medicare Other | Source: Ambulatory Visit | Attending: Radiation Oncology | Admitting: Radiation Oncology

## 2019-03-05 ENCOUNTER — Telehealth: Payer: Self-pay | Admitting: Oncology

## 2019-03-05 ENCOUNTER — Other Ambulatory Visit: Payer: Self-pay

## 2019-03-05 DIAGNOSIS — C3412 Malignant neoplasm of upper lobe, left bronchus or lung: Secondary | ICD-10-CM | POA: Diagnosis not present

## 2019-03-05 DIAGNOSIS — Z510 Encounter for antineoplastic radiation therapy: Secondary | ICD-10-CM | POA: Diagnosis not present

## 2019-03-05 NOTE — Telephone Encounter (Signed)
Called and left msg

## 2019-03-06 ENCOUNTER — Ambulatory Visit
Admission: RE | Admit: 2019-03-06 | Discharge: 2019-03-06 | Disposition: A | Discharge status: Home / Self Care | Payer: Medicare Other | Source: Ambulatory Visit | Attending: Radiation Oncology | Admitting: Radiation Oncology

## 2019-03-06 ENCOUNTER — Other Ambulatory Visit: Payer: Self-pay

## 2019-03-06 DIAGNOSIS — Z510 Encounter for antineoplastic radiation therapy: Secondary | ICD-10-CM | POA: Diagnosis not present

## 2019-03-06 DIAGNOSIS — C3412 Malignant neoplasm of upper lobe, left bronchus or lung: Secondary | ICD-10-CM | POA: Diagnosis not present

## 2019-03-07 ENCOUNTER — Ambulatory Visit
Admission: RE | Admit: 2019-03-07 | Discharge: 2019-03-07 | Disposition: A | Discharge status: Home / Self Care | Payer: Medicare Other | Source: Ambulatory Visit | Attending: Radiation Oncology | Admitting: Radiation Oncology

## 2019-03-07 ENCOUNTER — Other Ambulatory Visit: Payer: Self-pay | Admitting: Radiation Oncology

## 2019-03-07 ENCOUNTER — Other Ambulatory Visit: Payer: Self-pay

## 2019-03-07 DIAGNOSIS — Z510 Encounter for antineoplastic radiation therapy: Secondary | ICD-10-CM | POA: Diagnosis not present

## 2019-03-07 DIAGNOSIS — C3412 Malignant neoplasm of upper lobe, left bronchus or lung: Secondary | ICD-10-CM | POA: Diagnosis not present

## 2019-03-07 MED ORDER — SUCRALFATE 1 G PO TABS: 1.0000 g | ORAL_TABLET | Freq: Four times a day (QID) | ORAL | 2 refills | Status: DC

## 2019-03-08 ENCOUNTER — Other Ambulatory Visit: Payer: Self-pay | Admitting: Oncology

## 2019-03-10 ENCOUNTER — Inpatient Hospital Stay (HOSPITAL_BASED_OUTPATIENT_CLINIC_OR_DEPARTMENT_OTHER): Payer: Medicare Other | Admitting: Medical

## 2019-03-10 ENCOUNTER — Inpatient Hospital Stay: Payer: Medicare Other

## 2019-03-10 ENCOUNTER — Other Ambulatory Visit: Payer: Self-pay

## 2019-03-10 ENCOUNTER — Ambulatory Visit
Admission: RE | Admit: 2019-03-10 | Discharge: 2019-03-10 | Disposition: A | Discharge status: Home / Self Care | Payer: Medicare Other | Source: Ambulatory Visit | Attending: Radiation Oncology | Admitting: Radiation Oncology

## 2019-03-10 VITALS — BP 142/50 | HR 57 | Temp 98.0°F | Resp 16

## 2019-03-10 DIAGNOSIS — C3412 Malignant neoplasm of upper lobe, left bronchus or lung: Secondary | ICD-10-CM

## 2019-03-10 DIAGNOSIS — I255 Ischemic cardiomyopathy: Secondary | ICD-10-CM

## 2019-03-10 DIAGNOSIS — K208 Other esophagitis without bleeding: Secondary | ICD-10-CM

## 2019-03-10 DIAGNOSIS — Z510 Encounter for antineoplastic radiation therapy: Secondary | ICD-10-CM | POA: Diagnosis not present

## 2019-03-10 DIAGNOSIS — Z5111 Encounter for antineoplastic chemotherapy: Secondary | ICD-10-CM | POA: Diagnosis not present

## 2019-03-10 DIAGNOSIS — T451X5A Adverse effect of antineoplastic and immunosuppressive drugs, initial encounter: Secondary | ICD-10-CM

## 2019-03-10 DIAGNOSIS — I2510 Atherosclerotic heart disease of native coronary artery without angina pectoris: Secondary | ICD-10-CM | POA: Diagnosis not present

## 2019-03-10 DIAGNOSIS — D701 Agranulocytosis secondary to cancer chemotherapy: Secondary | ICD-10-CM | POA: Diagnosis not present

## 2019-03-10 DIAGNOSIS — Z85038 Personal history of other malignant neoplasm of large intestine: Secondary | ICD-10-CM | POA: Diagnosis not present

## 2019-03-10 DIAGNOSIS — I4891 Unspecified atrial fibrillation: Secondary | ICD-10-CM | POA: Diagnosis not present

## 2019-03-10 DIAGNOSIS — K5900 Constipation, unspecified: Secondary | ICD-10-CM | POA: Diagnosis not present

## 2019-03-10 LAB — CMP (CANCER CENTER ONLY)
ALT: 15 U/L (ref 0–44)
AST: 15 U/L (ref 15–41)
Albumin: 3.3 g/dL — ABNORMAL LOW (ref 3.5–5.0)
Alkaline Phosphatase: 85 U/L (ref 38–126)
Anion gap: 8 (ref 5–15)
BUN: 22 mg/dL (ref 8–23)
CO2: 25 mmol/L (ref 22–32)
Calcium: 8.6 mg/dL — ABNORMAL LOW (ref 8.9–10.3)
Chloride: 106 mmol/L (ref 98–111)
Creatinine: 1.16 mg/dL (ref 0.61–1.24)
GFR, Est AFR Am: >60 mL/min (ref >60)
GFR, Estimated: 56 mL/min — ABNORMAL LOW (ref >60)
Glucose, Bld: 161 mg/dL — ABNORMAL HIGH (ref 70–99)
Potassium: 4.4 mmol/L (ref 3.5–5.1)
Sodium: 139 mmol/L (ref 135–145)
Total Bilirubin: 1.6 mg/dL — ABNORMAL HIGH (ref 0.3–1.2)
Total Protein: 6.2 g/dL — ABNORMAL LOW (ref 6.5–8.1)

## 2019-03-10 LAB — CBC WITH DIFFERENTIAL (CANCER CENTER ONLY)
Abs Immature Granulocytes: 0.01 10*3/uL (ref 0.00–0.07)
Basophils Absolute: 0 10*3/uL (ref 0.0–0.1)
Basophils Relative: 1 %
Eosinophils Absolute: 0.1 10*3/uL (ref 0.0–0.5)
Eosinophils Relative: 3 %
HCT: 35.9 % — ABNORMAL LOW (ref 39.0–52.0)
Hemoglobin: 11.6 g/dL — ABNORMAL LOW (ref 13.0–17.0)
Immature Granulocytes: 1 %
Lymphocytes Relative: 38 %
Lymphs Abs: 0.7 10*3/uL (ref 0.7–4.0)
MCH: 29.1 pg (ref 26.0–34.0)
MCHC: 32.3 g/dL (ref 30.0–36.0)
MCV: 90 fL (ref 80.0–100.0)
Monocytes Absolute: 0.2 10*3/uL (ref 0.1–1.0)
Monocytes Relative: 9 %
Neutro Abs: 0.9 10*3/uL — ABNORMAL LOW (ref 1.7–7.7)
Neutrophils Relative %: 48 %
Platelet Count: 152 10*3/uL (ref 150–400)
RBC: 3.99 MIL/uL — ABNORMAL LOW (ref 4.22–5.81)
RDW: 13.2 % (ref 11.5–15.5)
WBC Count: 1.9 10*3/uL — ABNORMAL LOW (ref 4.0–10.5)
nRBC: 0 % (ref 0.0–0.2)

## 2019-03-10 MED ORDER — OMEPRAZOLE 40 MG PO CPDR: 40.0000 mg | DELAYED_RELEASE_CAPSULE | Freq: Every day | ORAL | 2 refills | Status: DC

## 2019-03-10 MED ORDER — ALUM & MAG HYDROXIDE-SIMETH 200-200-20 MG/5ML PO SUSP
ORAL | Status: AC
  Filled 2019-03-10: qty 30

## 2019-03-10 MED ORDER — SODIUM CHLORIDE 0.9 % IV SOLN
INTRAVENOUS | Status: DC
  Administered 2019-03-10: 10:00:00 via INTRAVENOUS
  Filled 2019-03-10 (×2): qty 250

## 2019-03-10 MED ORDER — LIDOCAINE VISCOUS HCL 2 % MT SOLN
15.0000 mL | Freq: Once | OROMUCOSAL | Status: AC
  Administered 2019-03-10: 10:00:00 15 mL via ORAL
  Filled 2019-03-10: qty 15

## 2019-03-10 MED ORDER — HYDROCODONE-ACETAMINOPHEN 7.5-325 MG/15ML PO SOLN: 5.0000 mL | Freq: Four times a day (QID) | ORAL | 0 refills | Status: DC | PRN

## 2019-03-10 MED ORDER — ALUM & MAG HYDROXIDE-SIMETH 200-200-20 MG/5ML PO SUSP
30.0000 mL | Freq: Once | ORAL | Status: AC
  Administered 2019-03-10: 10:00:00 30 mL via ORAL

## 2019-03-10 NOTE — Progress Notes (Signed)
Patient presented to treatment with multiple complaints. Verbalizes decreased energy with increased difficulty performing daily tasks; however, states he remains independent. Complains of "heartburn and indigestion that keeps getting worse," posterior neck pain, intermittent blurry vision when driving or in bright sunlight, and urge to have a bowel movement with little or no stool. Dr. Benay Spice and Sandi Mealy, PA-C notified. Sandi Mealy saw patient in treatment room. Will hold chemotherapy treatment today (ANC 0.9, total bilirubin 1.6) and administer 1L NS IV over 2 hours. Patient aware of plan of care and agrees to proceed.

## 2019-03-10 NOTE — Progress Notes (Signed)
[facility] Progress Note   TAEJON IRANI [id number] 1930-04-20 85 y.o.  [name] is managed by Dr. Dominica Severin B. Sherrill  Actively treated with chemotherapy/immunotherapy/hormonal therapy: yes  Current therapy: Carboplatin and paclitaxel with concurrent radiation therapy.  Last treated: 03/04/2019 (cycle 2, day 1)  Next scheduled appointment with provider: 03/18/2019  Assessment: Plan:    Malignant neoplasm of bronchus of left upper lobe (HCC)  Radiation-induced esophagitis - Plan: alum & mag hydroxide-simeth (MAALOX/MYLANTA) 200-200-20 MG/5ML suspension 30 mL, lidocaine (XYLOCAINE) 2 % viscous mouth solution 15 mL, omeprazole (PRILOSEC) 40 MG capsule, HYDROcodone-acetaminophen (HYCET) 7.5-325 mg/15 ml solution  Chemotherapy-induced neutropenia (HCC)   Malignant neoplasm of the left upper lobe bronchus with neutropenia: The patient is status post cycle 2, day 1 of carboplatin and paclitaxel which was dosed on 03/04/2019.  He continues on concurrent radiation therapy.  His labs today returned showing a WBC of 1.9 and an ANC of 0.9.  We will hold his chemotherapy today.  He will return on 03/18/2019 for consideration of ongoing chemotherapy.  A message has been sent to Dr. Lisbeth Renshaw regarding the patient's blood counts and esophagitis asking there opinion regarding treating the patient with radiation therapy today.  Radiation-induced esophagitis: The patient was given a GI cocktail today.  He was also given a prescription for Prilosec 40 mg once daily and Hycet, 7.5-325 mg / 15 mL with directions to take 5 mL every 6 hours as needed for pain.  He will continue taking Carafate as prescribed.  Please see After Visit Summary for patient specific instructions.  Future Appointments  Date Time Provider Lee  03/10/2019  1:00 PM [facility] LINAC 1 [facility] None  03/11/2019  1:00 PM [facility] LINAC 1 [facility] None  03/12/2019  1:00 PM [facility] LINAC 1  [facility] None  03/13/2019  1:00 PM [facility] LINAC 1 [facility] None  03/14/2019  8:00 AM [facility] LINAC 1 [facility] None  03/18/2019  7:45 AM [facility] LAB 4 [facility] None  03/18/2019  8:15 AM Benay Spice, Izola Price, MD [facility] None  03/18/2019  9:00 AM [facility] INFUSION [facility] None  03/18/2019  1:00 PM [facility] LINAC 1 [facility] None  03/19/2019  1:00 PM [facility] LINAC 1 [facility] None  03/20/2019  1:00 PM [facility] LINAC 1 [facility] None  03/21/2019  1:00 PM [facility] LINAC 1 [facility] None  03/24/2019  8:30 AM [facility] LAB 2 [facility] None  03/24/2019  9:00 AM [facility] INFUSION [facility] None  03/24/2019  1:00 PM [facility] LINAC 1 [facility] None  03/25/2019  1:00 PM [facility] LINAC 1 [facility] None  03/26/2019  1:00 PM [facility] LINAC 1 [facility] None  03/27/2019  1:00 PM [facility] LINAC 1 [facility] None  03/28/2019  1:00 PM [facility] LINAC 1 [facility] None  03/31/2019  1:00 PM [facility] LINAC 1 [facility] None  04/01/2019  1:00 PM [facility] LINAC 1 [facility] None  04/02/2019  1:00 PM [facility] LINAC 1 [facility] None  04/03/2019  1:00 PM [facility] LINAC 1 [facility] None  04/04/2019  1:00 PM [facility] LINAC 1 [facility] None  04/07/2019  1:00 PM [facility] LINAC 1 [facility] None  05/26/2019  3:40 PM McAlhany, Annita Brod, MD [facility]    No orders of the defined types were placed in this encounter.      Subjective:   Patient ID:  KAIROS PANETTA is a 85 y.o. (DOB Aug 10, 1929) male.  Chief Complaint: No chief complaint on file.   HPI ARSALAN BRISBIN is an 85 year old male with a diagnosis of a non-small  cell lung cancer of the bronchus of the left upper lobe.  The patient is managed by Dr. Dominica Severin B. Sherrill.  Who presented today for consideration of cycle 3 of carboplatin and paclitaxel with concurrent radiation therapy.  He has been using Carafate as prescribed by Dr. Lisbeth Renshaw.  He states that despite  this he continues to have burning in his chest with radiation to his upper mid back.  Dr. Lisbeth Renshaw had suggested to the patient that he take Prilosec.  Mr. Guild has not yet done this.  His labs today returned showing a WBC of 1.9 and an ANC of 0.9.  The patient is scheduled to receive radiation therapy today.  A message has been sent to Dr. Lisbeth Renshaw asking if the patient's radiation for this week should be deferred.  Medications: I have reviewed the patient's current medications.  Allergies:  Allergies  Allergen Reactions   Gadolinium Derivatives Hives, Itching and Other (See Comments)    Dr. Jeralyn Ruths s/w Mr. Steinberger. We observed him for 15 minutes. He did not need to take Benadryl. The symptoms began to subside before he left.     Past Medical History:  Diagnosis Date   A-fib [facility])    Arthritis    BPH (benign prostatic hyperplasia)    Cancer (HCC)    colon cancer    Chest pain, unspecified    Chronic kidney disease    Coronary atherosclerosis of unspecified type of vessel, native or graft    Dizziness and giddiness    Hyperlipidemia    Hypertension    Lung cancer ([place])    bx's done 08/09/2017   Myocardial infarction ([place]) 12 years ago   x2   Non-ST elevation (NSTEMI) myocardial infarction ([place]) 08/2017   dx afib,replaced stent   Peripheral neuropathy    PVD (peripheral vascular disease) (HCC)    Shortness of breath    exertion   Vitamin B12 deficiency     Past Surgical History:  Procedure Laterality Date   COLON SURGERY     partial colectomy Dr. Marcello Moores 12-14-17   CORONARY ANGIOPLASTY     4 stents   CORONARY STENT INTERVENTION N/A 08/09/2017   Procedure: CORONARY STENT INTERVENTION;  Surgeon: Nelva Bush, MD;  Location: [place] CV LAB;  Service: Cardiovascular;  Laterality: N/A;   FLEXIBLE SIGMOIDOSCOPY N/A 11/12/2017   Procedure: FLEXIBLE SIGMOIDOSCOPY;  Surgeon: Irene Shipper, MD;  Location: [facility];  Service: Endoscopy;  Laterality: N/A;    HERNIA REPAIR     right inguinal   INGUINAL HERNIA REPAIR  06/14/2012   Procedure: HERNIA REPAIR INGUINAL ADULT;  Surgeon: Odis Hollingshead, MD;  Location: [place];  Service: General;  Laterality: Right;   INSERTION OF MESH  06/14/2012   Procedure: INSERTION OF MESH;  Surgeon: Odis Hollingshead, MD;  Location: [place];  Service: General;  Laterality: Right;   LAPAROSCOPIC PARTIAL COLECTOMY N/A 12/26/2017   Procedure: LAPAROSCOPIC PARTIAL COLECTOMY ERAS PATHWAY;  Surgeon: Leighton Ruff, MD;  Location: [facility];  Service: General;  Laterality: N/A;   LEFT HEART CATH AND CORONARY ANGIOGRAPHY N/A 08/09/2017   Procedure: LEFT HEART CATH AND CORONARY ANGIOGRAPHY;  Surgeon: Nelva Bush, MD;  Location: [place] CV LAB;  Service: Cardiovascular;  Laterality: N/A;   PROCTOSCOPY N/A 12/26/2017   Procedure: PROCTOSCOPY;  Surgeon: Leighton Ruff, MD;  Location: [facility];  Service: General;  Laterality: N/A;   SINUS SURGERY WITH INSTATRAK  1985    Family History  Problem Relation Age of Onset  Heart attack Father 27   Arthritis Sister    Hypothyroidism Daughter    Colon cancer Neg Hx    Esophageal cancer Neg Hx    Rectal cancer Neg Hx    Stomach cancer Neg Hx     Social History   Socioeconomic History   Marital status: Divorced    Spouse name: Not on file   Number of children: 3   Years of education: Not on file   Highest education level: Not on file  Occupational History   Occupation: Magazine features editor    Comment: [place] resource strain: Not on file   Food insecurity    Worry: Not on file    Inability: Not on file   Transportation needs    Medical: No    Non-medical: No  Tobacco Use   Smoking status: Former Smoker    Quit date: 07/10/1981    Years since quitting: 37.6   Smokeless tobacco: Never Used  Substance and Sexual Activity   Alcohol use: No   Drug use: No   Sexual activity: Not on file  Lifestyle    Physical activity    Days per week: Not on file    Minutes per session: Not on file   Stress: Not on file  Relationships   Social connections    Talks on phone: Not on file    Gets together: Not on file    Attends religious service: Not on file    Active member of club or organization: Not on file    Attends meetings of clubs or organizations: Not on file    Relationship status: Not on file   Intimate partner violence    Fear of current or ex partner: Not on file    Emotionally abused: Not on file    Physically abused: Not on file    Forced sexual activity: Not on file  Other Topics Concern   Not on file  Social History Narrative   Regular exercise          Past Medical History, Surgical history, Social history, and Family history were reviewed and updated as appropriate.   Please see review of systems for further details on the patient's review from today.   Review of Systems:  Review of Systems  Constitutional: Negative for activity change, appetite change, chills, diaphoresis, fatigue, fever and unexpected weight change.  HENT: Positive for trouble swallowing.   Respiratory: Negative for cough, choking and shortness of breath.   Cardiovascular: Negative for chest pain.  Gastrointestinal: Negative for nausea and vomiting.    Objective:   Physical Exam:  There were no vitals taken for this visit. ECOG: 1  Physical Exam Constitutional:      General: He is not in acute distress.    Appearance: He is not diaphoretic.  HENT:     Head: Normocephalic and atraumatic.  Cardiovascular:     Rate and Rhythm: Normal rate and regular rhythm.     Heart sounds: Normal heart sounds. No murmur. No friction rub. No gallop.   Pulmonary:     Effort: Pulmonary effort is normal. No respiratory distress.     Breath sounds: Normal breath sounds. No wheezing or rales.  Skin:    General: Skin is warm and dry.     Findings: No erythema or rash.  Neurological:     Mental Status:  He is alert.     Lab Review:     Component Value  Date/Time   NA 139 03/10/2019 0906   NA 140 12/05/2018 1006   K 4.4 03/10/2019 0906   CL 106 03/10/2019 0906   CO2 25 03/10/2019 0906   GLUCOSE 161 (H) 03/10/2019 0906   BUN 22 03/10/2019 0906   BUN 30 (H) 12/05/2018 1006   CREATININE 1.16 03/10/2019 0906   CREATININE 1.36 (H) 06/14/2016 1133   CALCIUM 8.6 (L) 03/10/2019 0906   PROT 6.2 (L) 03/10/2019 0906   ALBUMIN 3.3 (L) 03/10/2019 0906   AST 15 03/10/2019 0906   ALT 15 03/10/2019 0906   ALKPHOS 85 03/10/2019 0906   BILITOT 1.6 (H) 03/10/2019 0906   GFRNONAA 56 (L) 03/10/2019 0906   GFRAA >60 03/10/2019 0906       Component Value Date/Time   WBC 1.9 (L) 03/10/2019 0906   WBC 8.4 07/13/2018 0303   RBC 3.99 (L) 03/10/2019 0906   HGB 11.6 (L) 03/10/2019 0906   HGB 13.2 12/05/2018 1006   HCT 35.9 (L) 03/10/2019 0906   HCT 41.0 12/05/2018 1006   PLT 152 03/10/2019 0906   PLT 201 12/05/2018 1006   MCV 90.0 03/10/2019 0906   MCV 91 12/05/2018 1006   MCH 29.1 03/10/2019 0906   MCHC 32.3 03/10/2019 0906   RDW 13.2 03/10/2019 0906   RDW 13.2 12/05/2018 1006   LYMPHSABS 0.7 03/10/2019 0906   MONOABS 0.2 03/10/2019 0906   EOSABS 0.1 03/10/2019 0906   BASOSABS 0.0 03/10/2019 0906   -------------------------------  Imaging from last 24 hours (if applicable):  Radiology interpretation: Mr Jeri Cos GX Contrast  Result Date: 02/12/2019 CLINICAL DATA:  85 year old male with progressed left lung cancer. Restaging. EXAM: MRI HEAD WITHOUT AND WITH CONTRAST TECHNIQUE: Multiplanar, multiecho pulse sequences of the brain and surrounding structures were obtained without and with intravenous contrast. CONTRAST:  7 milliliters Gadavist COMPARISON:  Brain MRI 02/14/2017. FINDINGS: Brain: No abnormal enhancement identified. No midline shift, mass effect, or evidence of intracranial mass lesion. No dural thickening. Stable cerebral volume. No restricted diffusion to suggest acute  infarction. No ventriculomegaly, or acute intracranial hemorrhage. Cervicomedullary junction and pituitary are within normal limits. Stable gray and white matter signal throughout the brain. Small chronic infarcts in the right superior cerebellar artery territory. No chronic blood products. Vascular: Major intracranial vascular flow voids are stable since 2018, including loss of the left ICA siphon flow void at the skull base (series 5, image 6) with evidence of reconstitution at the left ICA terminus. Generalized intracranial artery tortuosity. The major dural venous sinuses are enhancing and appear to be patent. Skull and upper cervical spine: Negative visible cervical spine and spinal cord. Visualized bone marrow signal is within normal limits. Sinuses/Orbits: Negative orbits. Chronic paranasal sinus surgery. Residual sinus mucosal thickening and enhancement has not significantly changed. Other: Mastoids remain clear. Visible internal auditory structures appear normal. Scalp and face soft tissues appear negative. IMPRESSION: 1.  No metastatic disease or acute intracranial abnormality. 2. Stable MRI appearance of the brain since 2018. Chronic left ICA occlusion. Electronically Signed   By: Genevie Ann M.D.   On: 02/12/2019 16:25        This patient was seen with Dr. Benay Spice with my treatment plan reviewed with him. He expressed agreement with my medical management of this patient.  This was a shared visit with Sandi Mealy.  Mr. Hollister interviewed and examined.  He has neutropenia secondary to chemotherapy and radiation.  Chemotherapy will be held this week.  He knows to call for any fever.  Mr. Knobel has radiation esophagitis.  No thrush at the oropharynx.  We prescribed an antacid and narcotic analgesic.  He will let us know if his pain has not improved over the next few days.  He reports he is able to tolerate a diet.   Julieanne Manson, MD

## 2019-03-10 NOTE — Patient Instructions (Signed)

## 2019-03-11 ENCOUNTER — Ambulatory Visit
Admission: RE | Admit: 2019-03-11 | Discharge: 2019-03-11 | Disposition: A | Discharge status: Home / Self Care | Payer: Medicare Other | Source: Ambulatory Visit | Attending: Radiation Oncology | Admitting: Radiation Oncology

## 2019-03-11 ENCOUNTER — Telehealth: Payer: Self-pay | Admitting: Medical

## 2019-03-11 ENCOUNTER — Other Ambulatory Visit: Payer: Self-pay

## 2019-03-11 ENCOUNTER — Telehealth: Payer: Self-pay | Admitting: Radiation Oncology

## 2019-03-11 DIAGNOSIS — Z510 Encounter for antineoplastic radiation therapy: Secondary | ICD-10-CM | POA: Diagnosis not present

## 2019-03-11 DIAGNOSIS — C3412 Malignant neoplasm of upper lobe, left bronchus or lung: Secondary | ICD-10-CM | POA: Diagnosis not present

## 2019-03-11 NOTE — Telephone Encounter (Signed)
I called the patient's daughter to let her know we could see him for his PUT visit on Thursday so he can leave earlier on Friday after treatment for a weekend trip. He is having esophagitis, and we reviewed the administration of carafate and pain medication as well. We can try lidocaine if this doesn't work and also suggested mylanta as well.

## 2019-03-11 NOTE — Progress Notes (Signed)
  Radiation Oncology         [phone] ________________________________  Name: [name] MRN: [id number]  Date: 02/14/2019  DOB: Mar 29, 1930  RESPIRATORY MOTION MANAGEMENT SIMULATION  NARRATIVE:  In order to account for effect of respiratory motion on target structures and other organs in the planning and delivery of radiotherapy, this patient underwent respiratory motion management simulation.  To accomplish this, when the patient was brought to the CT simulation planning suite, 4D respiratoy motion management CT images were obtained.  The CT images were loaded into the planning software.  Then, using a variety of tools including Cine, MIP, and standard views, the target volume and planning target volumes (PTV) were delineated.  Avoidance structures were contoured.  Treatment planning then occurred.  Dose volume histograms were generated and reviewed for each of the requested structure.  The resulting plan was carefully reviewed and approved today.   ------------------------------------------------  Jodelle Gross, MD, PhD

## 2019-03-11 NOTE — Telephone Encounter (Signed)
No los per 8/31.

## 2019-03-11 NOTE — Progress Notes (Signed)
  Radiation Oncology         [phone] ________________________________  Name: [name] MRN: [id number]  Date: 02/14/2019  DOB: July 29, 1929  SIMULATION AND TREATMENT PLANNING NOTE  DIAGNOSIS:     ICD-10-CM   1. Malignant neoplasm of bronchus of left upper lobe ([place])  C34.12      Site:  Chest/ mediastinum  NARRATIVE:  The patient was brought to the [place].  Identity was confirmed.  All relevant records and images related to the planned course of therapy were reviewed.   Written consent to proceed with treatment was confirmed which was freely given after reviewing the details related to the planned course of therapy had been reviewed with the patient.  Then, the patient was set-up in a stable reproducible  supine position for radiation therapy.  CT images were obtained.  Surface markings were placed.    Medically necessary complex treatment device(s) for immobilization:  Vac-lock bag.   The CT images were loaded into the planning software.  Then the target and avoidance structures were contoured.  Treatment planning then occurred.  The radiation prescription was entered and confirmed.  A total of 4 complex treatment devices were fabricated which relate to the designed radiation treatment fields. Additional reduced fields will be used as necessary to improve the dose homogeneity of the plan. Each of these customized fields/ complex treatment devices will be used on a daily basis during the radiation course. I have requested : 3D Simulation  I have requested a DVH of the following structures: target volume, spinal cord, lungs, heart.   The patient will undergo daily image guidance to ensure accurate localization of the target, and adequate minimize dose to the normal surrounding structures in close proximity to the target.  PLAN:  The patient will receive 60 Gy in 30 fractions initially.    Special treatment procedure The patient will also receive concurrent  chemotherapy during the treatment. The patient may therefore experience increased toxicity or side effects and the patient will be monitored for such problems. This may require extra lab work as necessary. This therefore constitutes a special treatment procedure.   ________________________________   Jodelle Gross, MD, PhD

## 2019-03-12 ENCOUNTER — Ambulatory Visit
Admission: RE | Admit: 2019-03-12 | Discharge: 2019-03-12 | Disposition: A | Discharge status: Home / Self Care | Payer: Medicare Other | Source: Ambulatory Visit | Attending: Radiation Oncology | Admitting: Radiation Oncology

## 2019-03-12 ENCOUNTER — Other Ambulatory Visit: Payer: Self-pay

## 2019-03-12 ENCOUNTER — Telehealth: Payer: Self-pay | Admitting: *Deleted

## 2019-03-12 DIAGNOSIS — Z510 Encounter for antineoplastic radiation therapy: Secondary | ICD-10-CM | POA: Diagnosis not present

## 2019-03-12 DIAGNOSIS — C3412 Malignant neoplasm of upper lobe, left bronchus or lung: Secondary | ICD-10-CM | POA: Diagnosis not present

## 2019-03-12 NOTE — Telephone Encounter (Signed)
Notified daughter of appointment with Dr. Benay Spice on 03/18/19 at 0815 and that MD will have her on speaker phone in room during the visit. She expressed displeasure at this and insists on being allowed into office with her father due to being [facility] and difficulty remembering things. If not, she wants MD to do a zoom meeting with her while Dr. Benay Spice is in the exam room with patient. Forwarded her request to Dr. Tammi Klippel.

## 2019-03-13 ENCOUNTER — Other Ambulatory Visit: Payer: Self-pay

## 2019-03-13 ENCOUNTER — Ambulatory Visit
Admission: RE | Admit: 2019-03-13 | Discharge: 2019-03-13 | Disposition: A | Discharge status: Home / Self Care | Payer: Medicare Other | Source: Ambulatory Visit | Attending: Radiation Oncology | Admitting: Radiation Oncology

## 2019-03-13 DIAGNOSIS — Z510 Encounter for antineoplastic radiation therapy: Secondary | ICD-10-CM | POA: Diagnosis not present

## 2019-03-13 DIAGNOSIS — C3412 Malignant neoplasm of upper lobe, left bronchus or lung: Secondary | ICD-10-CM | POA: Diagnosis not present

## 2019-03-14 ENCOUNTER — Ambulatory Visit
Admission: RE | Admit: 2019-03-14 | Discharge: 2019-03-14 | Disposition: A | Discharge status: Home / Self Care | Payer: Medicare Other | Source: Ambulatory Visit | Attending: Radiation Oncology | Admitting: Radiation Oncology

## 2019-03-14 ENCOUNTER — Other Ambulatory Visit: Payer: Self-pay

## 2019-03-14 DIAGNOSIS — C3412 Malignant neoplasm of upper lobe, left bronchus or lung: Secondary | ICD-10-CM | POA: Diagnosis not present

## 2019-03-14 DIAGNOSIS — Z510 Encounter for antineoplastic radiation therapy: Secondary | ICD-10-CM | POA: Diagnosis not present

## 2019-03-18 ENCOUNTER — Inpatient Hospital Stay: Payer: Medicare Other

## 2019-03-18 ENCOUNTER — Ambulatory Visit
Admission: RE | Admit: 2019-03-18 | Discharge: 2019-03-18 | Disposition: A | Discharge status: Home / Self Care | Payer: Medicare Other | Source: Ambulatory Visit | Attending: Radiation Oncology | Admitting: Radiation Oncology

## 2019-03-18 ENCOUNTER — Inpatient Hospital Stay (HOSPITAL_BASED_OUTPATIENT_CLINIC_OR_DEPARTMENT_OTHER): Payer: Medicare Other | Admitting: Oncology

## 2019-03-18 ENCOUNTER — Other Ambulatory Visit: Payer: Self-pay

## 2019-03-18 ENCOUNTER — Inpatient Hospital Stay: Payer: Medicare Other | Attending: Oncology

## 2019-03-18 ENCOUNTER — Other Ambulatory Visit: Payer: Medicare Other

## 2019-03-18 VITALS — BP 120/53 | HR 85 | Temp 98.3°F | Resp 17 | Ht 71.0 in | Wt 159.0 lb

## 2019-03-18 DIAGNOSIS — C3412 Malignant neoplasm of upper lobe, left bronchus or lung: Secondary | ICD-10-CM | POA: Diagnosis not present

## 2019-03-18 DIAGNOSIS — N289 Disorder of kidney and ureter, unspecified: Secondary | ICD-10-CM | POA: Insufficient documentation

## 2019-03-18 DIAGNOSIS — J449 Chronic obstructive pulmonary disease, unspecified: Secondary | ICD-10-CM | POA: Diagnosis not present

## 2019-03-18 DIAGNOSIS — Z9221 Personal history of antineoplastic chemotherapy: Secondary | ICD-10-CM | POA: Diagnosis not present

## 2019-03-18 DIAGNOSIS — Z923 Personal history of irradiation: Secondary | ICD-10-CM | POA: Insufficient documentation

## 2019-03-18 DIAGNOSIS — M542 Cervicalgia: Secondary | ICD-10-CM | POA: Insufficient documentation

## 2019-03-18 DIAGNOSIS — I4891 Unspecified atrial fibrillation: Secondary | ICD-10-CM | POA: Diagnosis not present

## 2019-03-18 DIAGNOSIS — I255 Ischemic cardiomyopathy: Secondary | ICD-10-CM | POA: Diagnosis not present

## 2019-03-18 DIAGNOSIS — R1310 Dysphagia, unspecified: Secondary | ICD-10-CM | POA: Diagnosis not present

## 2019-03-18 DIAGNOSIS — Z5111 Encounter for antineoplastic chemotherapy: Secondary | ICD-10-CM | POA: Insufficient documentation

## 2019-03-18 DIAGNOSIS — Z510 Encounter for antineoplastic radiation therapy: Secondary | ICD-10-CM | POA: Diagnosis not present

## 2019-03-18 DIAGNOSIS — N400 Enlarged prostate without lower urinary tract symptoms: Secondary | ICD-10-CM | POA: Diagnosis not present

## 2019-03-18 DIAGNOSIS — K5900 Constipation, unspecified: Secondary | ICD-10-CM | POA: Diagnosis not present

## 2019-03-18 LAB — CMP (CANCER CENTER ONLY)
ALT: 18 U/L (ref 0–44)
AST: 20 U/L (ref 15–41)
Albumin: 3.4 g/dL — ABNORMAL LOW (ref 3.5–5.0)
Alkaline Phosphatase: 94 U/L (ref 38–126)
Anion gap: 9 (ref 5–15)
BUN: 30 mg/dL — ABNORMAL HIGH (ref 8–23)
CO2: 26 mmol/L (ref 22–32)
Calcium: 8.7 mg/dL — ABNORMAL LOW (ref 8.9–10.3)
Chloride: 104 mmol/L (ref 98–111)
Creatinine: 1.27 mg/dL — ABNORMAL HIGH (ref 0.61–1.24)
GFR, Est AFR Am: 58 mL/min — ABNORMAL LOW (ref >60)
GFR, Estimated: 50 mL/min — ABNORMAL LOW (ref >60)
Glucose, Bld: 128 mg/dL — ABNORMAL HIGH (ref 70–99)
Potassium: 3.7 mmol/L (ref 3.5–5.1)
Sodium: 139 mmol/L (ref 135–145)
Total Bilirubin: 1 mg/dL (ref 0.3–1.2)
Total Protein: 6.1 g/dL — ABNORMAL LOW (ref 6.5–8.1)

## 2019-03-18 LAB — CBC WITH DIFFERENTIAL (CANCER CENTER ONLY)
Abs Immature Granulocytes: 0.04 10*3/uL (ref 0.00–0.07)
Basophils Absolute: 0 10*3/uL (ref 0.0–0.1)
Basophils Relative: 1 %
Eosinophils Absolute: 0.1 10*3/uL (ref 0.0–0.5)
Eosinophils Relative: 1 %
HCT: 34.8 % — ABNORMAL LOW (ref 39.0–52.0)
Hemoglobin: 11.3 g/dL — ABNORMAL LOW (ref 13.0–17.0)
Immature Granulocytes: 1 %
Lymphocytes Relative: 16 %
Lymphs Abs: 0.6 10*3/uL — ABNORMAL LOW (ref 0.7–4.0)
MCH: 29.9 pg (ref 26.0–34.0)
MCHC: 32.5 g/dL (ref 30.0–36.0)
MCV: 92.1 fL (ref 80.0–100.0)
Monocytes Absolute: 0.7 10*3/uL (ref 0.1–1.0)
Monocytes Relative: 19 %
Neutro Abs: 2.2 10*3/uL (ref 1.7–7.7)
Neutrophils Relative %: 62 %
Platelet Count: 152 10*3/uL (ref 150–400)
RBC: 3.78 MIL/uL — ABNORMAL LOW (ref 4.22–5.81)
RDW: 14 % (ref 11.5–15.5)
WBC Count: 3.5 10*3/uL — ABNORMAL LOW (ref 4.0–10.5)
nRBC: 0 % (ref 0.0–0.2)

## 2019-03-18 MED ORDER — DEXAMETHASONE SODIUM PHOSPHATE 10 MG/ML IJ SOLN
INTRAMUSCULAR | Status: AC
  Filled 2019-03-18: qty 1

## 2019-03-18 MED ORDER — SODIUM CHLORIDE 0.9 % IV SOLN
35.0000 mg/m2 | Freq: Once | INTRAVENOUS | Status: AC
  Administered 2019-03-18: 66 mg via INTRAVENOUS
  Filled 2019-03-18: qty 11

## 2019-03-18 MED ORDER — DIPHENHYDRAMINE HCL 50 MG/ML IJ SOLN
INTRAMUSCULAR | Status: AC
  Filled 2019-03-18: qty 1

## 2019-03-18 MED ORDER — SODIUM CHLORIDE 0.9 % IV SOLN
Freq: Once | INTRAVENOUS | Status: AC
  Administered 2019-03-18: 10:00:00 via INTRAVENOUS
  Filled 2019-03-18: qty 250

## 2019-03-18 MED ORDER — DIPHENHYDRAMINE HCL 50 MG/ML IJ SOLN
25.0000 mg | Freq: Once | INTRAMUSCULAR | Status: AC
  Administered 2019-03-18: 25 mg via INTRAVENOUS

## 2019-03-18 MED ORDER — PALONOSETRON HCL INJECTION 0.25 MG/5ML
0.2500 mg | Freq: Once | INTRAVENOUS | Status: AC
  Administered 2019-03-18: 0.25 mg via INTRAVENOUS

## 2019-03-18 MED ORDER — DEXAMETHASONE SODIUM PHOSPHATE 10 MG/ML IJ SOLN
10.0000 mg | Freq: Once | INTRAMUSCULAR | Status: AC
  Administered 2019-03-18: 10 mg via INTRAVENOUS

## 2019-03-18 MED ORDER — SODIUM CHLORIDE 0.9 % IV SOLN
100.0000 mg | Freq: Once | INTRAVENOUS | Status: AC
  Administered 2019-03-18: 12:00:00 100 mg via INTRAVENOUS
  Filled 2019-03-18: qty 10

## 2019-03-18 MED ORDER — FAMOTIDINE IN NACL 20-0.9 MG/50ML-% IV SOLN
INTRAVENOUS | Status: AC
  Filled 2019-03-18: qty 50

## 2019-03-18 MED ORDER — FAMOTIDINE IN NACL 20-0.9 MG/50ML-% IV SOLN
20.0000 mg | Freq: Once | INTRAVENOUS | Status: AC
  Administered 2019-03-18: 20 mg via INTRAVENOUS

## 2019-03-18 MED ORDER — PALONOSETRON HCL INJECTION 0.25 MG/5ML
INTRAVENOUS | Status: AC
  Filled 2019-03-18: qty 5

## 2019-03-18 NOTE — Progress Notes (Signed)
[place] OFFICE PROGRESS NOTE   Diagnosis: Non-small cell lung cancer  INTERVAL HISTORY:   [name] returns for scheduled visit.  Week 3 chemotherapy was held secondary to dehydration and odynophagia.  [name] continues to have pain with eating and in the upper back/lower neck.  The pain in the neck is constant and partially relieved when he bends forward.  Hydrocodone liquid relieves the pain.  He is unable to swallow the Carafate pills. No change in baseline peripheral neuropathy symptoms.  Objective:  Vital signs in last 24 hours:  Blood pressure (!) 120/53, pulse 85, temperature 98.3 F (36.8 C), temperature source Temporal, resp. rate 17, height _0  (1.803 m), weight 159 lb (72.1 kg), SpO2 100 %.    HEENT: No thrush or ulcers Vascular: Trace lower leg edema bilaterally Musculoskeletal: No pain with motion of the neck, no tenderness at the neck or upper back, no rash    Lab Results:  Lab Results  Component Value Date   WBC 3.5 (L) 03/18/2019   HGB 11.3 (L) 03/18/2019   HCT 34.8 (L) 03/18/2019   MCV 92.1 03/18/2019   PLT 152 03/18/2019   NEUTROABS 2.2 03/18/2019    CMP  Lab Results  Component Value Date   NA 139 03/18/2019   K 3.7 03/18/2019   CL 104 03/18/2019   CO2 26 03/18/2019   GLUCOSE 128 (H) 03/18/2019   BUN 30 (H) 03/18/2019   CREATININE 1.27 (H) 03/18/2019   CALCIUM 8.7 (L) 03/18/2019   PROT 6.1 (L) 03/18/2019   ALBUMIN 3.4 (L) 03/18/2019   AST 20 03/18/2019   ALT 18 03/18/2019   ALKPHOS 94 03/18/2019   BILITOT 1.0 03/18/2019   GFRNONAA 50 (L) 03/18/2019   GFRAA 58 (L) 03/18/2019     Medications: I have reviewed the patient's current medications.   Assessment/Plan: 1. Sigmoid colon cancer, stage II (T3N0), status post a sigmoidectomy 12/26/2017 ? Tumor involving the sigmoid colon and proximal rectum ? MSI-stable, no loss of mismatch repair protein expression ? Staging CTs of the chest, abdomen, and pelvis on  11/20/2017- focal wall thickening at the distal sigmoid colon, no evidence of metastatic disease, indeterminate 1.9 cm sub-solid left upper lobe nodule 2. Coronary artery disease 3. Atrial fibrillation 4. BPH 5. Left arm/hand intention tremor 6. Report of balance difficulty 7. COPD 8. Indeterminate left upper lobe nodule on the staging chest CT 11/20/2017  Increase in part solid nodule in the left upper lobe, stable groundglass nodule in the superior segment of the left lower lobe  CT biopsy- adenocarcinoma consistent with lung primary, MSS, tumor mutation burden 13, BRAF [id number], PDL-1 TPS 5%. No EGFR, ALK, ERBB2, ROS alteratation  SBRT 08/14/2018- 08/23/2018, 5 fractions  CT chest 10/23/2018 -2.6 x 1.7 cm left upper lobe nodule-enlarged, 13 mm left suprahilar node-possibly new, 9 mm AP window node previously 6 mm  CT chest 01/23/2019- new mediastinal and progressive hilar adenopathy,  PET scan 02/03/2019-hypermetabolic mediastinal and left hilar lymph nodes, hypermetabolic left upper lobe mass, no evidence of distant metastatic disease  MRI brain 02/12/2019- no metastatic disease  Radiation beginning 02/24/2019  Cycle 1 weekly Taxol/carboplatin 02/24/2019  Cycle 2 weekly Taxol/carboplatin 03/04/2019  Cycle 3 weekly Taxol/carboplatin 03/18/2019 (Taxol and carboplatin dose reduced secondary to neutropenia following cycle 2 9. Renal insufficiency 10.Odynophagia secondary to radiation esophagitis    Disposition: [name] is completing concurrent chemotherapy radiation for treatment of non-small cell lung cancer.  He has lost weight over the past few  weeks and has developed significant Odynophagia. I discussed the treatment plan with [name].  His daughter was present for today's visit by video.  I encouraged him to push fluids and liquid nutrition supplements.  He will complete another cycle of Taxol/carboplatin today with dose reductions secondary to neutropenia.  The white count has  recovered.  He will continue hydrocodone elixir for pain.  He will contact us if this does not help the pain.  He will begin Prilosec.  We will place lisinopril on hold secondary to weight loss and lower blood pressure readings.  [name] will be seen for office visit in 1 week.  We will plan the next cycle of chemotherapy for 2 weeks.  Betsy Coder, MD  03/18/2019  9:25 AM

## 2019-03-18 NOTE — Patient Instructions (Signed)
   [facility] Discharge Instructions for Patients Receiving Chemotherapy  Today you received the following chemotherapy agents Taxol and Carboplatin   To help prevent nausea and vomiting after your treatment, we encourage you to take your nausea medication as directed.    If you develop nausea and vomiting that is not controlled by your nausea medication, call the clinic.   BELOW ARE SYMPTOMS THAT SHOULD BE REPORTED IMMEDIATELY:  *FEVER GREATER THAN 100.5 F  *CHILLS WITH OR WITHOUT FEVER  NAUSEA AND VOMITING THAT IS NOT CONTROLLED WITH YOUR NAUSEA MEDICATION  *UNUSUAL SHORTNESS OF BREATH  *UNUSUAL BRUISING OR BLEEDING  TENDERNESS IN MOUTH AND THROAT WITH OR WITHOUT PRESENCE OF ULCERS  *URINARY PROBLEMS  *BOWEL PROBLEMS  UNUSUAL RASH Items with * indicate a potential emergency and should be followed up as soon as possible.  Feel free to call the clinic should you have any questions or concerns. The clinic phone number is [phone].  Please show the CHEMO ALERT CARD at check-in to the Emergency Department and triage nurse.   

## 2019-03-19 ENCOUNTER — Ambulatory Visit
Admission: RE | Admit: 2019-03-19 | Discharge: 2019-03-19 | Disposition: A | Discharge status: Home / Self Care | Payer: Medicare Other | Source: Ambulatory Visit | Attending: Radiation Oncology | Admitting: Radiation Oncology

## 2019-03-19 ENCOUNTER — Telehealth: Payer: Self-pay | Admitting: Oncology

## 2019-03-19 ENCOUNTER — Other Ambulatory Visit: Payer: Self-pay

## 2019-03-19 DIAGNOSIS — C3412 Malignant neoplasm of upper lobe, left bronchus or lung: Secondary | ICD-10-CM | POA: Diagnosis not present

## 2019-03-19 DIAGNOSIS — Z510 Encounter for antineoplastic radiation therapy: Secondary | ICD-10-CM | POA: Diagnosis not present

## 2019-03-19 NOTE — Telephone Encounter (Signed)
Called and left detailed msg about change in appts an added appts. Mailed printout

## 2019-03-20 ENCOUNTER — Ambulatory Visit
Admission: RE | Admit: 2019-03-20 | Discharge: 2019-03-20 | Disposition: A | Discharge status: Home / Self Care | Payer: Medicare Other | Source: Ambulatory Visit | Attending: Radiation Oncology | Admitting: Radiation Oncology

## 2019-03-20 ENCOUNTER — Other Ambulatory Visit: Payer: Self-pay

## 2019-03-20 DIAGNOSIS — C3412 Malignant neoplasm of upper lobe, left bronchus or lung: Secondary | ICD-10-CM | POA: Diagnosis not present

## 2019-03-20 DIAGNOSIS — Z510 Encounter for antineoplastic radiation therapy: Secondary | ICD-10-CM | POA: Diagnosis not present

## 2019-03-21 ENCOUNTER — Other Ambulatory Visit: Payer: Self-pay

## 2019-03-21 ENCOUNTER — Ambulatory Visit
Admission: RE | Admit: 2019-03-21 | Discharge: 2019-03-21 | Disposition: A | Discharge status: Home / Self Care | Payer: Medicare Other | Source: Ambulatory Visit | Attending: Radiation Oncology | Admitting: Radiation Oncology

## 2019-03-21 DIAGNOSIS — C3412 Malignant neoplasm of upper lobe, left bronchus or lung: Secondary | ICD-10-CM | POA: Diagnosis not present

## 2019-03-21 DIAGNOSIS — Z510 Encounter for antineoplastic radiation therapy: Secondary | ICD-10-CM | POA: Diagnosis not present

## 2019-03-22 ENCOUNTER — Other Ambulatory Visit: Payer: Self-pay | Admitting: Medical

## 2019-03-22 DIAGNOSIS — T66XXXA Radiation sickness, unspecified, initial encounter: Secondary | ICD-10-CM

## 2019-03-24 ENCOUNTER — Other Ambulatory Visit: Payer: Medicare Other

## 2019-03-24 ENCOUNTER — Ambulatory Visit: Payer: Medicare Other

## 2019-03-24 ENCOUNTER — Other Ambulatory Visit: Payer: Self-pay

## 2019-03-24 ENCOUNTER — Ambulatory Visit
Admission: RE | Admit: 2019-03-24 | Discharge: 2019-03-24 | Disposition: A | Discharge status: Home / Self Care | Payer: Medicare Other | Source: Ambulatory Visit | Attending: Radiation Oncology | Admitting: Radiation Oncology

## 2019-03-24 ENCOUNTER — Inpatient Hospital Stay (HOSPITAL_BASED_OUTPATIENT_CLINIC_OR_DEPARTMENT_OTHER): Payer: Medicare Other | Admitting: Nurse Practitioner

## 2019-03-24 ENCOUNTER — Encounter: Payer: Self-pay | Admitting: Nurse Practitioner

## 2019-03-24 DIAGNOSIS — N289 Disorder of kidney and ureter, unspecified: Secondary | ICD-10-CM | POA: Diagnosis not present

## 2019-03-24 DIAGNOSIS — K208 Other esophagitis without bleeding: Secondary | ICD-10-CM

## 2019-03-24 DIAGNOSIS — Z510 Encounter for antineoplastic radiation therapy: Secondary | ICD-10-CM | POA: Diagnosis not present

## 2019-03-24 DIAGNOSIS — C3412 Malignant neoplasm of upper lobe, left bronchus or lung: Secondary | ICD-10-CM | POA: Diagnosis not present

## 2019-03-24 DIAGNOSIS — Z5111 Encounter for antineoplastic chemotherapy: Secondary | ICD-10-CM | POA: Diagnosis not present

## 2019-03-24 DIAGNOSIS — K5900 Constipation, unspecified: Secondary | ICD-10-CM | POA: Diagnosis not present

## 2019-03-24 DIAGNOSIS — I255 Ischemic cardiomyopathy: Secondary | ICD-10-CM

## 2019-03-24 DIAGNOSIS — M542 Cervicalgia: Secondary | ICD-10-CM | POA: Diagnosis not present

## 2019-03-24 DIAGNOSIS — R1310 Dysphagia, unspecified: Secondary | ICD-10-CM | POA: Diagnosis not present

## 2019-03-24 MED ORDER — HYDROCODONE-ACETAMINOPHEN 7.5-325 MG/15ML PO SOLN: 5.0000 mL | Freq: Four times a day (QID) | ORAL | 0 refills | Status: DC | PRN

## 2019-03-24 NOTE — Progress Notes (Signed)
[facility] OFFICE PROGRESS NOTE   Diagnosis: Non-small cell lung cancer  INTERVAL HISTORY:   [name] returns as scheduled.  He completed a cycle of weekly Taxol/carboplatin on 03/18/2019.  He denies nausea/vomiting.  No mouth sores.  Recent constipation.  No change in baseline numbness/tingling in the feet which predated chemotherapy.  When he woke up this morning the left leg felt weak.  No back pain.  The left leg does not feel weak at present.  We walked him in the office and he felt "steady".  He notes some difficulty initiating the urine stream.  He is tolerating soft solids.  Odynophagia is unchanged.  He denies pain in the low back.  He had similar left leg weakness about a year ago.  Objective:  Vital signs in last 24 hours:  Pulse 69, temperature 98.7 F (37.1 C), temperature source Oral, resp. rate 18, height 5' 11"  (1.803 m), weight 156 lb 14.4 oz (71.2 kg), SpO2 97 %.    HEENT: No thrush or ulcers. Resp: Lungs clear bilaterally. Cardio: Regular rate and rhythm. GI: Abdomen soft and nontender.  No hepatomegaly. Vascular: No leg edema. Neuro: Motor strength in the lower extremities is 5/5.  Gait is slow, steady.   Lab Results:  Lab Results  Component Value Date   WBC 3.5 (L) 03/18/2019   HGB 11.3 (L) 03/18/2019   HCT 34.8 (L) 03/18/2019   MCV 92.1 03/18/2019   PLT 152 03/18/2019   NEUTROABS 2.2 03/18/2019    Imaging:  No results found.  Medications: I have reviewed the patient's current medications.  Assessment/Plan: 1. Sigmoid colon cancer, stage II (T3N0), status post a sigmoidectomy 12/26/2017 ? Tumor involving the sigmoid colon and proximal rectum ? MSI-stable, no loss of mismatch repair protein expression ? Staging CTs of the chest, abdomen, and pelvis on 11/20/2017-focal wall thickening at the distal sigmoid colon, no evidence of metastatic disease, indeterminate 1.9 cm sub-solid left upper lobe nodule 2. Coronary artery disease 3.  Atrial fibrillation 4. BPH 5. Left arm/hand intention tremor 6. Report of balance difficulty 7. COPD 8. Indeterminate left upper lobe nodule on the staging chest CT 11/20/2017  Increase in part solid nodule in the left upper lobe, stable groundglass nodule in the superior segment of the left lower lobe  CT biopsy- adenocarcinoma consistent with lung primary, MSS, tumor mutation burden 13, BRAF [id number], PDL-1 TPS 5%. No EGFR, ALK, ERBB2, ROS alteratation  SBRT 08/14/2018-08/23/2018, 5 fractions  CT chest 10/23/2018 -2.6 x 1.7 cm left upper lobe nodule-enlarged, 13 mm left suprahilar node-possibly new, 9 mm AP window node previously 6 mm  CT chest 01/23/2019-new mediastinal and progressive hilar adenopathy,  PET scan 02/03/2019-hypermetabolic mediastinal and left hilar lymph nodes, hypermetabolic left upper lobe mass, no evidence of distant metastatic disease  MRI brain 02/12/2019-no metastatic disease  Radiation beginning 02/24/2019  Cycle 1 weekly Taxol/carboplatin 02/24/2019  Cycle 2 weekly Taxol/carboplatin 03/04/2019  Cycle 3 weekly Taxol/carboplatin 03/18/2019 (Taxol and carboplatin dose reduced secondary to neutropenia following cycle 2) 9. 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  He continues radiation.  He has completed 3 cycles of weekly Taxol/carboplatin.  He is scheduled to complete cycle 4 in 1 week.  He has persistent odynophagia.  He is tolerating soft solids.  He will work on increasing fluid intake.  The left leg felt weak earlier today.  On exam leg strength is intact.  He will let us know if symptoms recur.  He will return for lab, follow-up, chemotherapy in 1 week.  He will contact the office in the interim as outlined above with any other problems.  Patient seen with Dr. Benay Spice.  25 minutes were spent face-to-face at today's visit with the majority of that time involved in counseling/coordination of  care.    Ned Card ANP/GNP-BC   03/24/2019  1:00 PM  This was a shared visit with Ned Card.  [name] was interviewed and examined.  His overall status appears improved compared to when I saw him last week.  He is tolerating a diet and pain is controlled with hydrocodone.  He had transient left leg weakness earlier today.  He will call for persistent leg weakness.  We recommended a bowel regimen.  His daughter was present for today's visit.   Julieanne Manson, MD

## 2019-03-25 ENCOUNTER — Other Ambulatory Visit: Payer: Self-pay

## 2019-03-25 ENCOUNTER — Telehealth: Payer: Self-pay

## 2019-03-25 ENCOUNTER — Inpatient Hospital Stay: Payer: Medicare Other

## 2019-03-25 ENCOUNTER — Ambulatory Visit
Admission: RE | Admit: 2019-03-25 | Discharge: 2019-03-25 | Disposition: A | Discharge status: Home / Self Care | Payer: Medicare Other | Source: Ambulatory Visit | Attending: Radiation Oncology | Admitting: Radiation Oncology

## 2019-03-25 DIAGNOSIS — C3412 Malignant neoplasm of upper lobe, left bronchus or lung: Secondary | ICD-10-CM | POA: Diagnosis not present

## 2019-03-25 DIAGNOSIS — Z510 Encounter for antineoplastic radiation therapy: Secondary | ICD-10-CM | POA: Diagnosis not present

## 2019-03-25 NOTE — Progress Notes (Signed)
Nutrition  Unable to connect with Daughter this am to complete nutrition visit.   Kaizer Dissinger B. Zenia Resides, [place], [place] Registered Dietitian [phone] (pager)

## 2019-03-25 NOTE — Telephone Encounter (Signed)
Nutrition  Received message for RD to call Daughter Ferne Reus for nutrition appointment.  Called daughter 9:50am and unable to speak with RD at that time.  Ask that RD call back in about 1 hour.    RD just called daughter back and no answer.  Left message with RD call back number.   [name] B. Zenia Resides, [place], [place] Registered Dietitian [phone] (pager)

## 2019-03-26 ENCOUNTER — Other Ambulatory Visit: Payer: Self-pay | Admitting: *Deleted

## 2019-03-26 ENCOUNTER — Telehealth: Payer: Self-pay

## 2019-03-26 ENCOUNTER — Other Ambulatory Visit: Payer: Self-pay

## 2019-03-26 ENCOUNTER — Telehealth: Payer: Self-pay | Admitting: *Deleted

## 2019-03-26 ENCOUNTER — Ambulatory Visit
Admission: RE | Admit: 2019-03-26 | Discharge: 2019-03-26 | Disposition: A | Discharge status: Home / Self Care | Payer: Medicare Other | Source: Ambulatory Visit | Attending: Radiation Oncology | Admitting: Radiation Oncology

## 2019-03-26 DIAGNOSIS — C3412 Malignant neoplasm of upper lobe, left bronchus or lung: Secondary | ICD-10-CM | POA: Diagnosis not present

## 2019-03-26 DIAGNOSIS — Z510 Encounter for antineoplastic radiation therapy: Secondary | ICD-10-CM | POA: Diagnosis not present

## 2019-03-26 NOTE — Telephone Encounter (Signed)
Daughter reports his stability is not good. Reports his left leg feels as if a muscle has been pulled. Having difficulty w/ambulation and is now using a cane. She is asking if he needs another scan or xray? Does MD think he has Parkinson's? MD notified of her concern.

## 2019-03-26 NOTE — Telephone Encounter (Signed)
Nutrition Assessment   Reason for Assessment:  Referral for painful swallowing, weight loss   ASSESSMENT:  85 year old male with non small cell lung cancer.  Patient with CAD, afib, COPD.  Patient receiving chemotherapy and radiation therapy.   Spoke with daughter, [name] this am via phone.  Daughter reports that she is caregiver for father.  Has experience with cancer patients as husband had throat cancer when he was under 82.  She is well versed in nutrition, exercise.  Husband does crossfit and runs marathons.  Reports that she cooks and is able to prepare meals for father.  Reports that his biggest issue is painful swallowing and eating smaller volumes.  Daughter has been preparing chicken soup that patient is eating, preparing shakes with protein powder, yogurt, avocados.      Nutrition Focused Physical Exam: deferred   Medications: vit b 12, probiotic, prilosec, hycet   Labs: glucose 128, BUN 30, creatinine 1.27   Anthropometrics:   Height: 71 inches Weight: 156 lb 14.4 oz (9/14) UBW: 170 lb on Jan 2020 BMI: 21  8% weight loss in the last  7 months    Estimated Energy Needs  Kcals: 2100-2480 Protein: 105-124g Fluid: 2.1 L   NUTRITION DIAGNOSIS: Inadequate oral intake related to cancer and cancer related treatment side effects as evidenced by 8% weight loss in the last 8 months, poor po intake    INTERVENTION: Encouraged pureeing, chopping, grinding foods for easy of swallowing.  Daughter is familiar due to husband's past cancer.   Encouraged eating/drinking q 2 hours Encouraged being proactive with giving pain medications Daughter has contact information   MONITORING, EVALUATION, GOAL: Patient will consume adequate calories and protein to maintain weight   Next Visit: Daughter to contact me   Ahmiyah Coil B. Zenia Resides, [place], [place] Registered Dietitian [phone] (pager)

## 2019-03-27 ENCOUNTER — Other Ambulatory Visit: Payer: Self-pay

## 2019-03-27 ENCOUNTER — Ambulatory Visit
Admission: RE | Admit: 2019-03-27 | Discharge: 2019-03-27 | Disposition: A | Discharge status: Home / Self Care | Payer: Medicare Other | Source: Ambulatory Visit | Attending: Radiation Oncology | Admitting: Radiation Oncology

## 2019-03-27 DIAGNOSIS — Z510 Encounter for antineoplastic radiation therapy: Secondary | ICD-10-CM | POA: Diagnosis not present

## 2019-03-27 DIAGNOSIS — C3412 Malignant neoplasm of upper lobe, left bronchus or lung: Secondary | ICD-10-CM | POA: Diagnosis not present

## 2019-03-28 ENCOUNTER — Ambulatory Visit
Admission: RE | Admit: 2019-03-28 | Discharge: 2019-03-28 | Disposition: A | Discharge status: Home / Self Care | Payer: Medicare Other | Source: Ambulatory Visit | Attending: Radiation Oncology | Admitting: Radiation Oncology

## 2019-03-28 ENCOUNTER — Other Ambulatory Visit: Payer: Self-pay

## 2019-03-28 DIAGNOSIS — Z510 Encounter for antineoplastic radiation therapy: Secondary | ICD-10-CM | POA: Diagnosis not present

## 2019-03-28 DIAGNOSIS — C3412 Malignant neoplasm of upper lobe, left bronchus or lung: Secondary | ICD-10-CM | POA: Diagnosis not present

## 2019-03-29 ENCOUNTER — Other Ambulatory Visit: Payer: Self-pay | Admitting: Oncology

## 2019-03-31 ENCOUNTER — Encounter: Payer: Self-pay | Admitting: Nurse Practitioner

## 2019-03-31 ENCOUNTER — Ambulatory Visit
Admission: RE | Admit: 2019-03-31 | Discharge: 2019-03-31 | Disposition: A | Discharge status: Home / Self Care | Payer: Medicare Other | Source: Ambulatory Visit | Attending: Radiation Oncology | Admitting: Radiation Oncology

## 2019-03-31 ENCOUNTER — Inpatient Hospital Stay: Payer: Medicare Other

## 2019-03-31 ENCOUNTER — Inpatient Hospital Stay (HOSPITAL_BASED_OUTPATIENT_CLINIC_OR_DEPARTMENT_OTHER): Payer: Medicare Other | Admitting: Nurse Practitioner

## 2019-03-31 ENCOUNTER — Other Ambulatory Visit: Payer: Self-pay

## 2019-03-31 VITALS — BP 144/64 | HR 66 | Temp 98.2°F | Resp 17 | Ht 71.0 in | Wt 157.6 lb

## 2019-03-31 DIAGNOSIS — Z5111 Encounter for antineoplastic chemotherapy: Secondary | ICD-10-CM | POA: Diagnosis not present

## 2019-03-31 DIAGNOSIS — C3412 Malignant neoplasm of upper lobe, left bronchus or lung: Secondary | ICD-10-CM

## 2019-03-31 DIAGNOSIS — Z510 Encounter for antineoplastic radiation therapy: Secondary | ICD-10-CM | POA: Diagnosis not present

## 2019-03-31 DIAGNOSIS — I255 Ischemic cardiomyopathy: Secondary | ICD-10-CM

## 2019-03-31 DIAGNOSIS — M542 Cervicalgia: Secondary | ICD-10-CM | POA: Diagnosis not present

## 2019-03-31 DIAGNOSIS — K5900 Constipation, unspecified: Secondary | ICD-10-CM | POA: Diagnosis not present

## 2019-03-31 DIAGNOSIS — R1310 Dysphagia, unspecified: Secondary | ICD-10-CM | POA: Diagnosis not present

## 2019-03-31 DIAGNOSIS — N289 Disorder of kidney and ureter, unspecified: Secondary | ICD-10-CM | POA: Diagnosis not present

## 2019-03-31 LAB — CBC WITH DIFFERENTIAL (CANCER CENTER ONLY)
Abs Immature Granulocytes: 0.03 10*3/uL (ref 0.00–0.07)
Basophils Absolute: 0 10*3/uL (ref 0.0–0.1)
Basophils Relative: 1 %
Eosinophils Absolute: 0 10*3/uL (ref 0.0–0.5)
Eosinophils Relative: 0 %
HCT: 36 % — ABNORMAL LOW (ref 39.0–52.0)
Hemoglobin: 11.6 g/dL — ABNORMAL LOW (ref 13.0–17.0)
Immature Granulocytes: 1 %
Lymphocytes Relative: 5 %
Lymphs Abs: 0.3 10*3/uL — ABNORMAL LOW (ref 0.7–4.0)
MCH: 29.5 pg (ref 26.0–34.0)
MCHC: 32.2 g/dL (ref 30.0–36.0)
MCV: 91.6 fL (ref 80.0–100.0)
Monocytes Absolute: 0.5 10*3/uL (ref 0.1–1.0)
Monocytes Relative: 7 %
Neutro Abs: 5.2 10*3/uL (ref 1.7–7.7)
Neutrophils Relative %: 86 %
Platelet Count: 137 10*3/uL — ABNORMAL LOW (ref 150–400)
RBC: 3.93 MIL/uL — ABNORMAL LOW (ref 4.22–5.81)
RDW: 14.6 % (ref 11.5–15.5)
WBC Count: 6.1 10*3/uL (ref 4.0–10.5)
nRBC: 0 % (ref 0.0–0.2)

## 2019-03-31 LAB — CMP (CANCER CENTER ONLY)
ALT: 16 U/L (ref 0–44)
AST: 18 U/L (ref 15–41)
Albumin: 3.6 g/dL (ref 3.5–5.0)
Alkaline Phosphatase: 126 U/L (ref 38–126)
Anion gap: 10 (ref 5–15)
BUN: 20 mg/dL (ref 8–23)
CO2: 26 mmol/L (ref 22–32)
Calcium: 9 mg/dL (ref 8.9–10.3)
Chloride: 102 mmol/L (ref 98–111)
Creatinine: 1.12 mg/dL (ref 0.61–1.24)
GFR, Est AFR Am: >60 mL/min (ref >60)
GFR, Estimated: 59 mL/min — ABNORMAL LOW (ref >60)
Glucose, Bld: 127 mg/dL — ABNORMAL HIGH (ref 70–99)
Potassium: 3.8 mmol/L (ref 3.5–5.1)
Sodium: 138 mmol/L (ref 135–145)
Total Bilirubin: 1.3 mg/dL — ABNORMAL HIGH (ref 0.3–1.2)
Total Protein: 6.5 g/dL (ref 6.5–8.1)

## 2019-03-31 MED ORDER — PALONOSETRON HCL INJECTION 0.25 MG/5ML
0.2500 mg | Freq: Once | INTRAVENOUS | Status: AC
  Administered 2019-03-31: 15:00:00 0.25 mg via INTRAVENOUS

## 2019-03-31 MED ORDER — DEXAMETHASONE SODIUM PHOSPHATE 10 MG/ML IJ SOLN
INTRAMUSCULAR | Status: AC
  Filled 2019-03-31: qty 1

## 2019-03-31 MED ORDER — SODIUM CHLORIDE 0.9 % IV SOLN
35.0000 mg/m2 | Freq: Once | INTRAVENOUS | Status: AC
  Administered 2019-03-31: 66 mg via INTRAVENOUS
  Filled 2019-03-31: qty 11

## 2019-03-31 MED ORDER — SODIUM CHLORIDE 0.9 % IV SOLN
Freq: Once | INTRAVENOUS | Status: AC
  Administered 2019-03-31: 15:00:00 via INTRAVENOUS
  Filled 2019-03-31: qty 250

## 2019-03-31 MED ORDER — DEXAMETHASONE SODIUM PHOSPHATE 10 MG/ML IJ SOLN
10.0000 mg | Freq: Once | INTRAMUSCULAR | Status: AC
  Administered 2019-03-31: 10 mg via INTRAVENOUS

## 2019-03-31 MED ORDER — PALONOSETRON HCL INJECTION 0.25 MG/5ML
INTRAVENOUS | Status: AC
  Filled 2019-03-31: qty 5

## 2019-03-31 MED ORDER — SODIUM CHLORIDE 0.9 % IV SOLN
108.6000 mg | Freq: Once | INTRAVENOUS | Status: AC
  Administered 2019-03-31: 110 mg via INTRAVENOUS
  Filled 2019-03-31: qty 11

## 2019-03-31 MED ORDER — FAMOTIDINE IN NACL 20-0.9 MG/50ML-% IV SOLN
20.0000 mg | Freq: Once | INTRAVENOUS | Status: AC
  Administered 2019-03-31: 15:00:00 20 mg via INTRAVENOUS

## 2019-03-31 MED ORDER — FAMOTIDINE IN NACL 20-0.9 MG/50ML-% IV SOLN
INTRAVENOUS | Status: AC
  Filled 2019-03-31: qty 50

## 2019-03-31 MED ORDER — DIPHENHYDRAMINE HCL 50 MG/ML IJ SOLN
INTRAMUSCULAR | Status: AC
  Filled 2019-03-31: qty 1

## 2019-03-31 MED ORDER — DIPHENHYDRAMINE HCL 50 MG/ML IJ SOLN
25.0000 mg | Freq: Once | INTRAMUSCULAR | Status: AC
  Administered 2019-03-31: 15:00:00 25 mg via INTRAVENOUS

## 2019-03-31 NOTE — Patient Instructions (Signed)
   [facility] Discharge Instructions for Patients Receiving Chemotherapy  Today you received the following chemotherapy agents Taxol and Carboplatin   To help prevent nausea and vomiting after your treatment, we encourage you to take your nausea medication as directed.    If you develop nausea and vomiting that is not controlled by your nausea medication, call the clinic.   BELOW ARE SYMPTOMS THAT SHOULD BE REPORTED IMMEDIATELY:  *FEVER GREATER THAN 100.5 F  *CHILLS WITH OR WITHOUT FEVER  NAUSEA AND VOMITING THAT IS NOT CONTROLLED WITH YOUR NAUSEA MEDICATION  *UNUSUAL SHORTNESS OF BREATH  *UNUSUAL BRUISING OR BLEEDING  TENDERNESS IN MOUTH AND THROAT WITH OR WITHOUT PRESENCE OF ULCERS  *URINARY PROBLEMS  *BOWEL PROBLEMS  UNUSUAL RASH Items with * indicate a potential emergency and should be followed up as soon as possible.  Feel free to call the clinic should you have any questions or concerns. The clinic phone number is [phone].  Please show the CHEMO ALERT CARD at check-in to the Emergency Department and triage nurse.   

## 2019-03-31 NOTE — Progress Notes (Signed)
Met with patient/accompanying person at registration to introduce myself as Arboriculturist and to offer available resources.  Discussed one-time $57 Engineer, drilling to assist with personal expenses while going through treatment.  Gave her my card if interested in applying and for any additional financial questions or concerns. She verbalized understanding and was very Patent attorney.

## 2019-03-31 NOTE — Progress Notes (Addendum)
[place] OFFICE PROGRESS NOTE   Diagnosis: Non-small cell lung cancer  INTERVAL HISTORY:   [name] returns as scheduled.  He notes intermittent left leg weakness.  His daughter thinks weakness is some improved today.  Hands and feet felt "warm" earlier this morning.  He thinks his swallowing is "a little better".  He typically takes his pain medication at bedtime.  He continues to have constipation.  He recently took a dose of MiraLAX.  Objective:  Vital signs in last 24 hours:  Blood pressure (!) 144/64, pulse 66, temperature 98.2 F (36.8 C), temperature source Oral, resp. rate 17, height '5\' 11"'  (1.803 m), weight 157 lb 9.6 oz (71.5 kg), SpO2 99 %.    HEENT: No thrush or ulcers.  Mucous membranes appear moist. GI: Abdomen soft and nontender.  No hepatomegaly. Vascular: No leg edema. Neuro: Upper extremity motor strength intact.  Lower extremity motor strength intact.   Lab Results:  Lab Results  Component Value Date   WBC 6.1 03/31/2019   HGB 11.6 (L) 03/31/2019   HCT 36.0 (L) 03/31/2019   MCV 91.6 03/31/2019   PLT 137 (L) 03/31/2019   NEUTROABS 5.2 03/31/2019    Imaging:  No results found.  Medications: I have reviewed the patient's current medications.  Assessment/Plan: 1. Sigmoid colon cancer, stage II (T3N0), status post a sigmoidectomy 12/26/2017 ? Tumor involving the sigmoid colon and proximal rectum ? MSI-stable, no loss of mismatch repair protein expression ? Staging CTs of the chest, abdomen, and pelvis on 11/20/2017-focal wall thickening at the distal sigmoid colon, no evidence of metastatic disease, indeterminate 1.9 cm sub-solid left upper lobe nodule 2. Coronary artery disease 3. Atrial fibrillation 4. BPH 5. Left arm/hand intention tremor 6. Report of balance difficulty 7. COPD 8. Indeterminate left upper lobe nodule on the staging chest CT 11/20/2017  Increase in part solid nodule in the left upper lobe, stable groundglass  nodule in the superior segment of the left lower lobe  CT biopsy- adenocarcinoma consistent with lung primary, MSS, tumor mutation burden 13, BRAF G469A, PDL-1 TPS 5%. No EGFR, ALK, ERBB2, ROS alteratation  SBRT 08/14/2018-08/23/2018, 5 fractions  CT chest 10/23/2018 -2.6 x 1.7 cm left upper lobe nodule-enlarged, 13 mm left suprahilar node-possibly new, 9 mm AP window node previously 6 mm  CT chest 01/23/2019-new mediastinal and progressive hilar adenopathy,  PET scan 02/03/2019-hypermetabolic mediastinal and left hilar lymph nodes, hypermetabolic left upper lobe mass, no evidence of distant metastatic disease  MRI brain 02/12/2019-no metastatic disease  Radiation beginning 02/24/2019  Cycle 1 weekly Taxol/carboplatin 02/24/2019  Cycle 2 weekly Taxol/carboplatin 03/04/2019  Cycle 3 weekly Taxol/carboplatin 03/18/2019 (Taxol and carboplatin dose reduced secondary to neutropenia following cycle 2)  Cycle 4 weekly Taxol/carboplatin 03/31/2019 9. Renal insufficiency 10.Odynophagia secondary to radiation esophagitis  Disposition: [name] appears unchanged.  He continues radiation.  He will complete the fourth and final cycle of weekly Taxol/carboplatin today as scheduled.  We reviewed the CBC from today.  Counts are adequate to proceed with treatment.  He continues to have symptoms of radiation esophagitis.  He has pain medication to take as needed.  He will try to increase fluid intake.  For constipation he will continue MiraLAX on a daily schedule, adjust as needed.  Leg strength is intact on exam today.  He will contact the office with persistent leg weakness.  He will return for a CBC in 1 week.  He will return for a CBC and follow-up visit in 2 weeks.  He will contact the office in the interim as outlined above or with any other problems.  Daughter present for today's visit.  Patient seen with Dr. Benay Spice.    Ned Card ANP/GNP-BC   03/31/2019  2:12 PM  This was a shared  visit with Ned Card.  [name] was interviewed and examined.  He appears stable.  He will complete a final treatment with Taxol/carboplatin today.  Julieanne Manson, MD

## 2019-04-01 ENCOUNTER — Encounter (HOSPITAL_COMMUNITY): Payer: Self-pay | Admitting: Emergency Medicine

## 2019-04-01 ENCOUNTER — Other Ambulatory Visit: Payer: Self-pay

## 2019-04-01 ENCOUNTER — Ambulatory Visit
Admission: RE | Admit: 2019-04-01 | Discharge: 2019-04-01 | Disposition: A | Discharge status: Home / Self Care | Payer: Medicare Other | Source: Ambulatory Visit | Attending: Radiation Oncology | Admitting: Radiation Oncology

## 2019-04-01 ENCOUNTER — Emergency Department (HOSPITAL_COMMUNITY)
Admission: EM | Admit: 2019-04-01 | Discharge: 2019-04-01 | Disposition: A | Discharge status: Home / Self Care | Payer: Medicare Other | Attending: Emergency Medicine | Admitting: Emergency Medicine

## 2019-04-01 ENCOUNTER — Emergency Department (HOSPITAL_COMMUNITY): Payer: Medicare Other

## 2019-04-01 DIAGNOSIS — Z79899 Other long term (current) drug therapy: Secondary | ICD-10-CM | POA: Diagnosis not present

## 2019-04-01 DIAGNOSIS — Z85038 Personal history of other malignant neoplasm of large intestine: Secondary | ICD-10-CM | POA: Diagnosis not present

## 2019-04-01 DIAGNOSIS — K5900 Constipation, unspecified: Secondary | ICD-10-CM | POA: Insufficient documentation

## 2019-04-01 DIAGNOSIS — Z87891 Personal history of nicotine dependence: Secondary | ICD-10-CM | POA: Insufficient documentation

## 2019-04-01 DIAGNOSIS — N2889 Other specified disorders of kidney and ureter: Secondary | ICD-10-CM | POA: Diagnosis not present

## 2019-04-01 DIAGNOSIS — Z7982 Long term (current) use of aspirin: Secondary | ICD-10-CM | POA: Insufficient documentation

## 2019-04-01 DIAGNOSIS — I129 Hypertensive chronic kidney disease with stage 1 through stage 4 chronic kidney disease, or unspecified chronic kidney disease: Secondary | ICD-10-CM | POA: Insufficient documentation

## 2019-04-01 DIAGNOSIS — Z7901 Long term (current) use of anticoagulants: Secondary | ICD-10-CM | POA: Insufficient documentation

## 2019-04-01 DIAGNOSIS — N189 Chronic kidney disease, unspecified: Secondary | ICD-10-CM | POA: Insufficient documentation

## 2019-04-01 DIAGNOSIS — Z510 Encounter for antineoplastic radiation therapy: Secondary | ICD-10-CM | POA: Diagnosis not present

## 2019-04-01 DIAGNOSIS — R339 Retention of urine, unspecified: Secondary | ICD-10-CM | POA: Diagnosis not present

## 2019-04-01 DIAGNOSIS — C3412 Malignant neoplasm of upper lobe, left bronchus or lung: Secondary | ICD-10-CM | POA: Diagnosis not present

## 2019-04-01 LAB — URINALYSIS, ROUTINE W REFLEX MICROSCOPIC
Bilirubin Urine: NEGATIVE
Glucose, UA: 50 mg/dL — AB
Ketones, ur: NEGATIVE mg/dL
Leukocytes,Ua: NEGATIVE
Nitrite: NEGATIVE
Protein, ur: 30 mg/dL — AB
RBC / HPF: >50 RBC/hpf — ABNORMAL HIGH (ref 0–5)
Specific Gravity, Urine: 1.009 (ref 1.005–1.030)
pH: 7 (ref 5.0–8.0)

## 2019-04-01 MED ORDER — IOHEXOL 300 MG/ML  SOLN
100.0000 mL | Freq: Once | INTRAMUSCULAR | Status: AC | PRN
  Administered 2019-04-01: 100 mL via INTRAVENOUS

## 2019-04-01 NOTE — ED Notes (Signed)
Patient verbalizes understanding of discharge instructions. Opportunity for questioning and answers were provided. Armband removed by staff, pt discharged from ED.  

## 2019-04-01 NOTE — Discharge Instructions (Addendum)
Thank you for allowing me to care for you today in the Emergency Department.   Keep the Foley catheter in place until you are seen by [facility].  Additional information on home care for a Foley catheter is attached in addition to the education you received in the ER today.  Call [facility] to schedule a follow-up appointment.  Take MiraLAX once daily.  Stop taking this medication if you start having diarrhea.  Return to the emergency department if you start passing lots of large blood clots in your urine, if you develop high fevers, vomiting, if the catheter stops draining urine, or if you develop other new, concerning symptoms.

## 2019-04-01 NOTE — ED Triage Notes (Signed)
Pt states he thinks he has a urinary blockage. Pt reports the last time he voided was this morning.

## 2019-04-01 NOTE — ED Provider Notes (Signed)
Medical screening examination/treatment/procedure(s) were conducted as a shared visit with non-physician practitioner(s) and myself.  I personally evaluated the patient during the encounter.      Patient is an 85 year old male who presented to the emergency department with constipation, bladder distention and urinary retention.  Bladder distention, urinary retention likely secondary to BPH.  Improved after placement of Foley catheter.  Some blood in the urine but likely secondary to catheter placement.  Culture pending.  Will discharge with catheter in place and follow-up with urology.  CT of the abdomen pelvis obtained given constipation with history of previous colon surgery from colon cancer.  CT shows no acute abnormality.  Recommended continuing MiraLAX at home for constipation.  He has a primary doctor for follow-up.   Banner Huckaba, Delice Bison, DO 04/01/19 [phone]

## 2019-04-01 NOTE — ED Provider Notes (Signed)
[facility] EMERGENCY DEPARTMENT Provider Note   CSN: [id number] Arrival date & time: 04/01/19  0018     History   Chief Complaint Chief Complaint  Patient presents with   Urinary Retention    HPI [name] is a 85 y.o. male with a history of BPH, colon cancer s/p surgery, left upper lobe lung cancer with current radiation and chemotherapy, and STEMI, and CAD who presents the emergency department with a chief complaint of urinary retention.  The patient has been unable to void since approximately 17:30 this afternoon after he returned home from receiving his chemotherapy and radiation treatment.  No history of similar.  He reports worsening pressure in the lower abdomen since he has been unable to void.  He is concerned that this may be related to his chemotherapy and radiation treatments.  No recent medication adjustments or new medications.  He reports that he has been struggling with constipation.  He was recently started on MiraLAX, but did not start the medication until yesterday.  Reports that he had a small, hard bowel movement while he was receiving his chemotherapy treatment.  He has been passing flatus.  He denies fever, chills, hematuria, dysuria, urinary frequency or hesitancy, diarrhea, back pain, nausea, or vomiting.     The history is provided by the patient. No language interpreter was used.    Past Medical History:  Diagnosis Date   A-fib [facility])    Arthritis    BPH (benign prostatic hyperplasia)    Cancer (HCC)    colon cancer    Chest pain, unspecified    Chronic kidney disease    Coronary atherosclerosis of unspecified type of vessel, native or graft    Dizziness and giddiness    Hyperlipidemia    Hypertension    Lung cancer ([place])    bx's done 08/09/2017   Myocardial infarction ([place]) 12 years ago   x2   Non-ST elevation (NSTEMI) myocardial infarction ([place]) 08/2017   dx afib,replaced stent   Peripheral neuropathy      PVD (peripheral vascular disease) ([place])    Shortness of breath    exertion   Vitamin B12 deficiency     Patient Active Problem List   Diagnosis Date Noted   Malignant neoplasm of bronchus of left upper lobe ([place]) 07/31/2018   Left upper lobe pulmonary nodule    Acute respiratory failure with hypoxia ([place]) 07/12/2018   Chest tube in place    Pneumothorax of left lung after biopsy 07/09/2018   Lung nodule 07/09/2018   Vitamin B12 deficiency 12/29/2017   Cancer of sigmoid colon s/p sigmoid colectomy 12/26/2017 12/26/2017   Long term (current) use of anticoagulants [Z79.01] 08/15/2017   Ischemic cardiomyopathy 08/13/2017   Hypertension 08/13/2017   Atrial fibrillation ([place]) 08/08/2017   Hyperlipidemia 11/09/2009   LBBB 11/09/2009   Coronary atherosclerosis 10/05/2008   DIZZINESS 10/05/2008   CHEST PAIN-UNSPECIFIED 10/05/2008    Past Surgical History:  Procedure Laterality Date   COLON SURGERY     partial colectomy Dr. Marcello Moores 12-14-17   CORONARY ANGIOPLASTY     4 stents   CORONARY STENT INTERVENTION N/A 08/09/2017   Procedure: CORONARY STENT INTERVENTION;  Surgeon: Nelva Bush, MD;  Location: [facility];  Service: Cardiovascular;  Laterality: N/A;   FLEXIBLE SIGMOIDOSCOPY N/A 11/12/2017   Procedure: FLEXIBLE SIGMOIDOSCOPY;  Surgeon: Irene Shipper, MD;  Location: [facility];  Service: Endoscopy;  Laterality: N/A;   HERNIA REPAIR     right inguinal  INGUINAL HERNIA REPAIR  06/14/2012   Procedure: HERNIA REPAIR INGUINAL ADULT;  Surgeon: Odis Hollingshead, MD;  Location: [facility];  Service: General;  Laterality: Right;   INSERTION OF MESH  06/14/2012   Procedure: INSERTION OF MESH;  Surgeon: Odis Hollingshead, MD;  Location: [facility];  Service: General;  Laterality: Right;   LAPAROSCOPIC PARTIAL COLECTOMY N/A 12/26/2017   Procedure: LAPAROSCOPIC PARTIAL COLECTOMY ERAS PATHWAY;  Surgeon: Leighton Ruff, MD;  Location: [facility];  Service: General;   Laterality: N/A;   LEFT HEART CATH AND CORONARY ANGIOGRAPHY N/A 08/09/2017   Procedure: LEFT HEART CATH AND CORONARY ANGIOGRAPHY;  Surgeon: Nelva Bush, MD;  Location: [facility];  Service: Cardiovascular;  Laterality: N/A;   PROCTOSCOPY N/A 12/26/2017   Procedure: PROCTOSCOPY;  Surgeon: Leighton Ruff, MD;  Location: [facility];  Service: General;  Laterality: N/A;   [place] Medications    Prior to Admission medications   Medication Sig Start Date End Date Taking? Authorizing Provider  apixaban (ELIQUIS) 5 MG TABS tablet Take 1 tablet (5 mg total) by mouth 2 (two) times daily. 09/25/18   Burnell Blanks, MD  aspirin EC 81 MG tablet Take 1 tablet (81 mg total) by mouth daily. 09/09/18   Burnell Blanks, MD  atorvastatin (LIPITOR) 40 MG tablet TAKE ONE TABLET EACH DAY AT [facility] 09/16/18   Burnell Blanks, MD  HYDROcodone-acetaminophen (HYCET) 7.5-325 mg/15 ml solution Take 5 mLs by mouth every 6 (six) hours as needed for moderate pain. 03/24/19   Owens Shark, NP  metoprolol succinate (TOPROL-XL) 25 MG 24 hr tablet Take 3 tablets (75 mg total) by mouth daily. 11/21/18   Burnell Blanks, MD  omeprazole (PRILOSEC) 40 MG capsule Take 1 capsule (40 mg total) by mouth daily. 03/10/19   Harle Stanford., PA-C  Probiotic Product (PROBIOTIC DAILY PO) Take 1 tablet by mouth daily.    [provider]  prochlorperazine (COMPAZINE) 5 MG tablet Take 1 tablet (5 mg total) by mouth every 6 (six) hours as needed for nausea or vomiting. 02/17/19   Ladell Pier, MD  vitamin B-12 (CYANOCOBALAMIN) 1000 MCG tablet Take 1,000 mcg by mouth daily.    [provider]    Family History Family History  Problem Relation Age of Onset   Heart attack Father 71   Arthritis Sister    Hypothyroidism Daughter    Colon cancer Neg Hx    Esophageal cancer Neg Hx    Rectal cancer Neg Hx    Stomach cancer Neg Hx     Social  History Social History   Tobacco Use   Smoking status: Former Smoker    Quit date: 07/10/1981    Years since quitting: 37.7   Smokeless tobacco: Never Used  Substance Use Topics   Alcohol use: No   Drug use: No     Allergies   Gadolinium derivatives   Review of Systems Review of Systems  Constitutional: Negative for appetite change, chills and fever.  HENT: Negative for congestion and sore throat.   Eyes: Negative for visual disturbance.  Respiratory: Negative for apnea, cough, shortness of breath and wheezing.   Cardiovascular: Negative for chest pain.  Gastrointestinal: Positive for abdominal pain. Negative for diarrhea, nausea and vomiting.  Genitourinary: Positive for difficulty urinating. Negative for dysuria, testicular pain and urgency.  Musculoskeletal: Negative for back pain, myalgias, neck pain and neck stiffness.  Skin:  Negative for rash.  Allergic/Immunologic: Negative for immunocompromised state.  Neurological: Negative for headaches.  Psychiatric/Behavioral: Negative for confusion.   Physical Exam Updated Vital Signs BP (!) 157/86 (BP Location: Right Arm)    Pulse (!) 102    Temp 97.7 F (36.5 C) (Oral)    Resp 19    SpO2 94%   Physical Exam Vitals signs and nursing note reviewed.  Constitutional:      General: He is not in acute distress.    Appearance: He is well-developed. He is not ill-appearing, toxic-appearing or diaphoretic.     Comments: Thin elderly male. NAD.  HENT:     Head: Normocephalic.  Eyes:     Conjunctiva/sclera: Conjunctivae normal.  Neck:     Musculoskeletal: Neck supple.  Cardiovascular:     Rate and Rhythm: Normal rate and regular rhythm.     Heart sounds: No murmur.  Pulmonary:     Effort: Pulmonary effort is normal.  Abdominal:     General: There is no distension.     Palpations: Abdomen is soft. There is no mass.     Tenderness: There is abdominal tenderness. There is no right CVA tenderness, left CVA tenderness,  guarding or rebound.     Hernia: No hernia is present.     Comments: Lower abdomen is hard and distended.  Tender to palpation over the suprapubic region and left lower quadrant.  No rebound or guarding.  Upper abdomen is nondistended and soft.  No tenderness throughout.  Normoactive bowel sounds.  No CVA tenderness bilaterally.  No tenderness over McBurney's point.  Skin:    General: Skin is warm and dry.  Neurological:     Mental Status: He is alert.  Psychiatric:        Behavior: Behavior normal.      ED Treatments / Results  Labs (all labs ordered are listed, but only abnormal results are displayed) Labs Reviewed  URINALYSIS, ROUTINE W REFLEX MICROSCOPIC - Abnormal; Notable for the following components:      Result Value   Glucose, UA 50 (*)    Hgb urine dipstick LARGE (*)    Protein, ur 30 (*)    RBC / HPF >50 (*)    Bacteria, UA RARE (*)    All other components within normal limits  URINE CULTURE    EKG None  Radiology Ct Abdomen Pelvis W Contrast  Result Date: 04/01/2019 CLINICAL DATA:  Constipation and urinary retention EXAM: CT ABDOMEN AND PELVIS WITH CONTRAST TECHNIQUE: Multidetector CT imaging of the abdomen and pelvis was performed using the standard protocol following bolus administration of intravenous contrast. CONTRAST:  141mL OMNIPAQUE IOHEXOL 300 MG/ML  SOLN COMPARISON:  11/20/2017 FINDINGS: Lower chest:  No contributory findings. Hepatobiliary: No focal liver abnormality.No evidence of biliary obstruction or stone. Pancreas: Unremarkable. Spleen: Unremarkable. Adrenals/Urinary Tract: Negative adrenals. Bilateral cortical atrophy and cystic densities. Left renal hilar cysts. No hydronephrosis or stone. Hilar calcifications are atherosclerotic. 1 cm solid/enhancing appearing mass exophytic from the interpolar left kidney. Bladder has been decompressed by Foley catheter. There is history of hematuria. No focal abnormality or clear inflammation. Stomach/Bowel:  Sigmoidectomy with unremarkable anastomosis. No bowel obstruction or evidence of inflammation-including appendicitis. Vascular/Lymphatic: Severe and widespread atherosclerotic calcification. No acute vascular occlusion is seen. Ectatic abdominal aorta measuring up to 2.6 cm. Reproductive:Symmetric prostate enlargement with nonspecific peripheral calcification. Other: No ascites or pneumoperitoneum. Musculoskeletal: Chronic L5 pars defects with L5-S1 grade 1 anterolisthesis and degenerative disease. IMPRESSION: 1. History of constipation with no  abnormal stool retention or bowel obstruction. 2. History of urinary retention. The bladder is decompressed by a Foley catheter above the enlarged prostate. 3. Remarkably severe atherosclerosis. 4. 1 cm left renal mass, suspect small carcinoma. There is been no significant growth since 2018. Electronically Signed   By: Monte Fantasia M.D.   On: 04/01/2019 05:37    Procedures Procedures (including critical care time)  Medications Ordered in ED Medications  iohexol (OMNIPAQUE) 300 MG/ML solution 100 mL (100 mLs Intravenous Contrast Given 04/01/19 0453)     Initial Impression / Assessment and Plan / ED Course  I have reviewed the triage vital signs and the nursing notes.  Pertinent labs & imaging results that were available during my care of the patient were reviewed by me and considered in my medical decision making (see chart for details).        85 year old male with a history of BPH, colon cancer s/p surgery, left upper lobe lung cancer with current radiation and chemotherapy, and STEMI, and CA who presents to the emergency department with urinary retention, onset this evening accompanied by suprapubic abdominal pain and constipation over the last few days.  He has a history of colon cancer s/p surgery that is currently in remission.  He is currently undergoing chemotherapy and radiation for left upper lobe lung cancer.  The patient also has a history of  BPH.  He has several radiation treatments left.  The patient was discussed with Dr. Leonides Schanz, attending physician.  Bladder scan with 781 ccs of urine. Foley catheter placed. Patient has had significant relief in pain since placement.  On exam, lower abdomen is hard and distended with tenderness in the suprapubic region and left lower quadrant.  I do not suspect this is distended bladder given hardness of the abdomen.  Will order CT abdomen pelvis to further assess for SBO given history of abdominal surgery, ileus, recurrence of colon cancer versus BPH.   Labs from earlier today at cancer center have been reviewed.  UA with mild hemoglobinuria and few RBCs.  ED tech reports that the patient did pass 1 small blood clot after Foley catheter was placed, but no additional clots.  Urine has had a mild pink tent, no gross hematuria additional clots of been passed.  Suspect this is secondary to catheter placement in the setting of BPH.  He is on Eliquis, but doubt sustained bleeding.  CT with constipation, but no evidence of obstruction.  These findings were discussed with the patient.    We will discharge the patient with Foley catheter in place and [facility].  Given his history of constipation, he is also been advised to continue MiraLAX.  All questions answered.  Foley catheter care provided in the ER.  The patient has also requested to provide this information to his daughter, [name], who was contacted.  However, I received her voicemail.  Detailed discharge instructions have been given.  He is hemodynamically stable and in no acute distress.  ER return precautions given.  Safe for discharge to home with outpatient follow-up.   Final Clinical Impressions(s) / ED Diagnoses   Final diagnoses:  Urinary retention  Constipation, unspecified constipation type    ED Discharge Orders    None       Tiberius Loftus A, PA-C 04/01/19 0720    Ward, Delice Bison, DO 04/01/19 1275

## 2019-04-02 ENCOUNTER — Telehealth: Payer: Self-pay | Admitting: Oncology

## 2019-04-02 ENCOUNTER — Ambulatory Visit
Admission: RE | Admit: 2019-04-02 | Discharge: 2019-04-02 | Disposition: A | Discharge status: Home / Self Care | Payer: Medicare Other | Source: Ambulatory Visit | Attending: Radiation Oncology | Admitting: Radiation Oncology

## 2019-04-02 ENCOUNTER — Telehealth: Payer: Self-pay | Admitting: *Deleted

## 2019-04-02 DIAGNOSIS — C3412 Malignant neoplasm of upper lobe, left bronchus or lung: Secondary | ICD-10-CM | POA: Diagnosis not present

## 2019-04-02 DIAGNOSIS — Z510 Encounter for antineoplastic radiation therapy: Secondary | ICD-10-CM | POA: Diagnosis not present

## 2019-04-02 LAB — URINE CULTURE: Culture: NO GROWTH

## 2019-04-02 NOTE — Telephone Encounter (Signed)
Called and spoke with patient. Confirmed appts  °

## 2019-04-02 NOTE — Telephone Encounter (Signed)
Spoke w/daughter and patient in lobby. Wanted to be sure Dr. Benay Spice is aware of ER visit and foley cath insertion. He wants it removed as soon as possible, however ER MD thinks he should leave it in until RT is completed. Informed him that per Dr. Benay Spice, he most likely has an enlarged prostate and the stress on his health and pain medication resulted in urinary retention and he agrees he should keep the foley until seen by urology.He reports seeing Dr. Karsten Ro in past and does not want to see him again. A family member has a urologist they think he should see and he will call tomorrow with the name. Concerned about constipation: instructed him to continue the MiraLax every day and the probiotic suggested by his daughter and daily [place]acado is reasonable as well--stressed that consistency in taking this will yield a much better result. He has stopped taking his pain med as well and says "I'm dealing with the pain". Encouraged him not to suffer needlessly and take pain med if needed. He agrees to allow daughter come in the morning and leave his Miralax and probiotic out for him to take every morning.

## 2019-04-03 ENCOUNTER — Ambulatory Visit
Admission: RE | Admit: 2019-04-03 | Discharge: 2019-04-03 | Disposition: A | Discharge status: Home / Self Care | Payer: Medicare Other | Source: Ambulatory Visit | Attending: Radiation Oncology | Admitting: Radiation Oncology

## 2019-04-03 DIAGNOSIS — Z510 Encounter for antineoplastic radiation therapy: Secondary | ICD-10-CM | POA: Diagnosis not present

## 2019-04-03 DIAGNOSIS — C3412 Malignant neoplasm of upper lobe, left bronchus or lung: Secondary | ICD-10-CM | POA: Diagnosis not present

## 2019-04-04 ENCOUNTER — Ambulatory Visit
Admission: RE | Admit: 2019-04-04 | Discharge: 2019-04-04 | Disposition: A | Discharge status: Home / Self Care | Payer: Medicare Other | Source: Ambulatory Visit | Attending: Radiation Oncology | Admitting: Radiation Oncology

## 2019-04-04 ENCOUNTER — Other Ambulatory Visit: Payer: Self-pay

## 2019-04-04 ENCOUNTER — Other Ambulatory Visit: Payer: Self-pay | Admitting: Oncology

## 2019-04-04 DIAGNOSIS — C3412 Malignant neoplasm of upper lobe, left bronchus or lung: Secondary | ICD-10-CM | POA: Diagnosis not present

## 2019-04-04 DIAGNOSIS — Z510 Encounter for antineoplastic radiation therapy: Secondary | ICD-10-CM | POA: Diagnosis not present

## 2019-04-07 ENCOUNTER — Ambulatory Visit
Admission: RE | Admit: 2019-04-07 | Discharge: 2019-04-07 | Disposition: A | Discharge status: Home / Self Care | Payer: Medicare Other | Source: Ambulatory Visit | Attending: Radiation Oncology | Admitting: Radiation Oncology

## 2019-04-07 ENCOUNTER — Inpatient Hospital Stay: Payer: Medicare Other

## 2019-04-07 ENCOUNTER — Telehealth: Payer: Self-pay

## 2019-04-07 ENCOUNTER — Other Ambulatory Visit: Payer: Self-pay

## 2019-04-07 ENCOUNTER — Encounter: Payer: Self-pay | Admitting: Radiation Oncology

## 2019-04-07 DIAGNOSIS — R1310 Dysphagia, unspecified: Secondary | ICD-10-CM | POA: Diagnosis not present

## 2019-04-07 DIAGNOSIS — Z510 Encounter for antineoplastic radiation therapy: Secondary | ICD-10-CM | POA: Diagnosis not present

## 2019-04-07 DIAGNOSIS — C3412 Malignant neoplasm of upper lobe, left bronchus or lung: Secondary | ICD-10-CM

## 2019-04-07 DIAGNOSIS — M542 Cervicalgia: Secondary | ICD-10-CM | POA: Diagnosis not present

## 2019-04-07 DIAGNOSIS — K5900 Constipation, unspecified: Secondary | ICD-10-CM | POA: Diagnosis not present

## 2019-04-07 DIAGNOSIS — N289 Disorder of kidney and ureter, unspecified: Secondary | ICD-10-CM | POA: Diagnosis not present

## 2019-04-07 DIAGNOSIS — Z5111 Encounter for antineoplastic chemotherapy: Secondary | ICD-10-CM | POA: Diagnosis not present

## 2019-04-07 LAB — CBC WITH DIFFERENTIAL (CANCER CENTER ONLY)
Abs Immature Granulocytes: 0.05 10*3/uL (ref 0.00–0.07)
Basophils Absolute: 0 10*3/uL (ref 0.0–0.1)
Basophils Relative: 1 %
Eosinophils Absolute: 0.1 10*3/uL (ref 0.0–0.5)
Eosinophils Relative: 3 %
HCT: 32.6 % — ABNORMAL LOW (ref 39.0–52.0)
Hemoglobin: 10.8 g/dL — ABNORMAL LOW (ref 13.0–17.0)
Immature Granulocytes: 1 %
Lymphocytes Relative: 13 %
Lymphs Abs: 0.5 10*3/uL — ABNORMAL LOW (ref 0.7–4.0)
MCH: 30.3 pg (ref 26.0–34.0)
MCHC: 33.1 g/dL (ref 30.0–36.0)
MCV: 91.6 fL (ref 80.0–100.0)
Monocytes Absolute: 0.4 10*3/uL (ref 0.1–1.0)
Monocytes Relative: 11 %
Neutro Abs: 2.9 10*3/uL (ref 1.7–7.7)
Neutrophils Relative %: 71 %
Platelet Count: 167 10*3/uL (ref 150–400)
RBC: 3.56 MIL/uL — ABNORMAL LOW (ref 4.22–5.81)
RDW: 14.6 % (ref 11.5–15.5)
WBC Count: 4 10*3/uL (ref 4.0–10.5)
nRBC: 0 % (ref 0.0–0.2)

## 2019-04-07 NOTE — Telephone Encounter (Signed)
TC to pt per Ned Card NP to let him know his blood counts look good. Follow-up as scheduled. Pt verbalized understanding. No further problems or concerns at this time.

## 2019-04-08 ENCOUNTER — Other Ambulatory Visit: Payer: Self-pay

## 2019-04-08 ENCOUNTER — Emergency Department (HOSPITAL_COMMUNITY)
Admission: EM | Admit: 2019-04-08 | Discharge: 2019-04-08 | Disposition: A | Discharge status: Home / Self Care | Payer: Medicare Other | Attending: Emergency Medicine | Admitting: Emergency Medicine

## 2019-04-08 ENCOUNTER — Encounter (HOSPITAL_COMMUNITY): Payer: Self-pay | Admitting: Emergency Medicine

## 2019-04-08 DIAGNOSIS — I4891 Unspecified atrial fibrillation: Secondary | ICD-10-CM | POA: Insufficient documentation

## 2019-04-08 DIAGNOSIS — T83011A Breakdown (mechanical) of indwelling urethral catheter, initial encounter: Secondary | ICD-10-CM | POA: Insufficient documentation

## 2019-04-08 DIAGNOSIS — T839XXA Unspecified complication of genitourinary prosthetic device, implant and graft, initial encounter: Secondary | ICD-10-CM

## 2019-04-08 DIAGNOSIS — Z955 Presence of coronary angioplasty implant and graft: Secondary | ICD-10-CM | POA: Diagnosis not present

## 2019-04-08 DIAGNOSIS — Z87891 Personal history of nicotine dependence: Secondary | ICD-10-CM | POA: Diagnosis not present

## 2019-04-08 DIAGNOSIS — Z7982 Long term (current) use of aspirin: Secondary | ICD-10-CM | POA: Diagnosis not present

## 2019-04-08 DIAGNOSIS — Y732 Prosthetic and other implants, materials and accessory gastroenterology and urology devices associated with adverse incidents: Secondary | ICD-10-CM | POA: Diagnosis not present

## 2019-04-08 DIAGNOSIS — N189 Chronic kidney disease, unspecified: Secondary | ICD-10-CM | POA: Diagnosis not present

## 2019-04-08 DIAGNOSIS — Z7901 Long term (current) use of anticoagulants: Secondary | ICD-10-CM | POA: Insufficient documentation

## 2019-04-08 DIAGNOSIS — C189 Malignant neoplasm of colon, unspecified: Secondary | ICD-10-CM | POA: Insufficient documentation

## 2019-04-08 DIAGNOSIS — C3490 Malignant neoplasm of unspecified part of unspecified bronchus or lung: Secondary | ICD-10-CM | POA: Diagnosis not present

## 2019-04-08 DIAGNOSIS — K5903 Drug induced constipation: Secondary | ICD-10-CM | POA: Diagnosis not present

## 2019-04-08 DIAGNOSIS — I129 Hypertensive chronic kidney disease with stage 1 through stage 4 chronic kidney disease, or unspecified chronic kidney disease: Secondary | ICD-10-CM | POA: Insufficient documentation

## 2019-04-08 DIAGNOSIS — Z79899 Other long term (current) drug therapy: Secondary | ICD-10-CM | POA: Insufficient documentation

## 2019-04-08 DIAGNOSIS — T83098A Other mechanical complication of other indwelling urethral catheter, initial encounter: Secondary | ICD-10-CM | POA: Diagnosis not present

## 2019-04-08 LAB — URINALYSIS, ROUTINE W REFLEX MICROSCOPIC
Bilirubin Urine: NEGATIVE
Glucose, UA: NEGATIVE mg/dL
Ketones, ur: NEGATIVE mg/dL
Nitrite: NEGATIVE
Protein, ur: 30 mg/dL — AB
Specific Gravity, Urine: 1.011 (ref 1.005–1.030)
pH: 7 (ref 5.0–8.0)

## 2019-04-08 MED ORDER — GLYCERIN (ADULT) 2 G RE SUPP: 1.0000 | RECTAL | 0 refills | Status: DC | PRN

## 2019-04-08 NOTE — ED Notes (Signed)
Pt states pain is related to his radiation treatments for lung cancer.

## 2019-04-08 NOTE — ED Notes (Signed)
pts son speaking with son at bedside.

## 2019-04-08 NOTE — Discharge Instructions (Signed)
Please read and follow all provided instructions.  Your diagnoses today include:  1. Foley catheter problem, initial encounter ([place])     Tests performed today include:  Urine test - no strong indications of infection  Vital signs. See below for your results today.   Medications prescribed:   Glycerin suppository  Take any prescribed medications only as directed.  Home care instructions:  Follow any educational materials contained in this packet.  BE VERY CAREFUL not to take multiple medicines containing Tylenol (also called acetaminophen). Doing so can lead to an overdose which can damage your liver and cause liver failure and possibly death.   Follow-up instructions: Please follow-up with your urologist as planned.   Return instructions:   Please return to the Emergency Department if you experience worsening symptoms.   Return if you are unable to urinate.  Please return if you have any other emergent concerns.  Additional Information:  Your vital signs today were: BP (!) 121/57    Pulse 60    Temp 98.4 F (36.9 C) (Oral)    Resp 19    SpO2 100%  If your blood pressure (BP) was elevated above 135/85 this visit, please have this repeated by your doctor within one month. --------------

## 2019-04-08 NOTE — ED Triage Notes (Signed)
Pt had foley placed on 9/22 for urinary retention and still is waiting for urology follow up. The last 2 days pt states the foley has been leaking around it and "making a mess". Pt states the bag is still filling but is also leaking around the tube.

## 2019-04-08 NOTE — ED Provider Notes (Signed)
[facility] EMERGENCY DEPARTMENT Provider Note   CSN: [id number] Arrival date & time: 04/08/19  1345     History   Chief Complaint Chief Complaint  Patient presents with  . foley leaking    HPI [name] is a 85 y.o. male.     Patient with history of colon cancer, BPH, chronic kidney disease --presents with complaint of leaking from around his Foley catheter.  Patient states he feels like the catheter is having trouble emptying.  He states that he feels like he has the urge to use the restroom but is trying to hold it because he is afraid that the urine will leak around the catheter onto the floor.  He denies any fevers, nausea or vomiting.  He has not noted any blood in the urine recently.  He is uncertain as to when he has follow-up with urology but thinks he might have an appointment in 2 days scheduled by his family. The onset of this condition was acute. The course is constant. Aggravating factors: none. Alleviating factors: none.       Past Medical History:  Diagnosis Date  . A-fib ([place])   . Arthritis   . BPH (benign prostatic hyperplasia)   . Cancer [facility])    colon cancer   . Chest pain, unspecified   . Chronic kidney disease   . Coronary atherosclerosis of unspecified type of vessel, native or graft   . Dizziness and giddiness   . Hyperlipidemia   . Hypertension   . Lung cancer ([place])    bx's done 08/09/2017  . Myocardial infarction ([place]) 12 years ago   x2  . Non-ST elevation (NSTEMI) myocardial infarction ([place]) 08/2017   dx afib,replaced stent  . Peripheral neuropathy   . PVD (peripheral vascular disease) ([place])   . Shortness of breath    exertion  . Vitamin B12 deficiency     Patient Active Problem List   Diagnosis Date Noted  . Malignant neoplasm of bronchus of left upper lobe ([place]) 07/31/2018  . Left upper lobe pulmonary nodule   . Acute respiratory failure with hypoxia ([place]) 07/12/2018  . Chest tube in place   . Pneumothorax  of left lung after biopsy 07/09/2018  . Lung nodule 07/09/2018  . Vitamin B12 deficiency 12/29/2017  . Cancer of sigmoid colon s/p sigmoid colectomy 12/26/2017 12/26/2017  . Long term (current) use of anticoagulants [Z79.01] 08/15/2017  . Ischemic cardiomyopathy 08/13/2017  . Hypertension 08/13/2017  . Atrial fibrillation ([place]) 08/08/2017  . Hyperlipidemia 11/09/2009  . LBBB 11/09/2009  . Coronary atherosclerosis 10/05/2008  . DIZZINESS 10/05/2008  . CHEST PAIN-UNSPECIFIED 10/05/2008    Past Surgical History:  Procedure Laterality Date  . COLON SURGERY     partial colectomy Dr. Marcello Moores 12-14-17  . CORONARY ANGIOPLASTY     4 stents  . CORONARY STENT INTERVENTION N/A 08/09/2017   Procedure: CORONARY STENT INTERVENTION;  Surgeon: Nelva Bush, MD;  Location: [place] CV LAB;  Service: Cardiovascular;  Laterality: N/A;  . FLEXIBLE SIGMOIDOSCOPY N/A 11/12/2017   Procedure: FLEXIBLE SIGMOIDOSCOPY;  Surgeon: Irene Shipper, MD;  Location: [facility] ENDOSCOPY;  Service: Endoscopy;  Laterality: N/A;  . HERNIA REPAIR     right inguinal  . INGUINAL HERNIA REPAIR  06/14/2012   Procedure: HERNIA REPAIR INGUINAL ADULT;  Surgeon: Odis Hollingshead, MD;  Location: [place];  Service: General;  Laterality: Right;  . INSERTION OF MESH  06/14/2012   Procedure: INSERTION OF MESH;  Surgeon: Odis Hollingshead,  MD;  Location: [place];  Service: General;  Laterality: Right;  . LAPAROSCOPIC PARTIAL COLECTOMY N/A 12/26/2017   Procedure: LAPAROSCOPIC PARTIAL COLECTOMY ERAS PATHWAY;  Surgeon: Leighton Ruff, MD;  Location: [facility] ORS;  Service: General;  Laterality: N/A;  . LEFT HEART CATH AND CORONARY ANGIOGRAPHY N/A 08/09/2017   Procedure: LEFT HEART CATH AND CORONARY ANGIOGRAPHY;  Surgeon: Nelva Bush, MD;  Location: [place] CV LAB;  Service: Cardiovascular;  Laterality: N/A;  . PROCTOSCOPY N/A 12/26/2017   Procedure: PROCTOSCOPY;  Surgeon: Leighton Ruff, MD;  Location: [facility] OR[facility];  Service: General;  Laterality: N/A;   . [place] Medications    Prior to Admission medications   Medication Sig Start Date End Date Taking? Authorizing Provider  apixaban (ELIQUIS) 5 MG TABS tablet Take 1 tablet (5 mg total) by mouth 2 (two) times daily. 09/25/18   Burnell Blanks, MD  aspirin EC 81 MG tablet Take 1 tablet (81 mg total) by mouth daily. 09/09/18   Burnell Blanks, MD  atorvastatin (LIPITOR) 40 MG tablet TAKE ONE TABLET EACH DAY AT [facility] 09/16/18   Burnell Blanks, MD  HYDROcodone-acetaminophen (HYCET) 7.5-325 mg/15 ml solution Take 5 mLs by mouth every 6 (six) hours as needed for moderate pain. 03/24/19   Owens Shark, NP  metoprolol succinate (TOPROL-XL) 25 MG 24 hr tablet Take 3 tablets (75 mg total) by mouth daily. 11/21/18   Burnell Blanks, MD  omeprazole (PRILOSEC) 40 MG capsule Take 1 capsule (40 mg total) by mouth daily. 03/10/19   Harle Stanford., PA-C  Probiotic Product (PROBIOTIC DAILY PO) Take 1 tablet by mouth daily.    [provider]  prochlorperazine (COMPAZINE) 5 MG tablet Take 1 tablet (5 mg total) by mouth every 6 (six) hours as needed for nausea or vomiting. 02/17/19   Ladell Pier, MD  vitamin B-12 (CYANOCOBALAMIN) 1000 MCG tablet Take 1,000 mcg by mouth daily.    [provider]    Family History Family History  Problem Relation Age of Onset  . Heart attack Father 17  . Arthritis Sister   . Hypothyroidism Daughter   . Colon cancer Neg Hx   . Esophageal cancer Neg Hx   . Rectal cancer Neg Hx   . Stomach cancer Neg Hx     Social History Social History   Tobacco Use  . Smoking status: Former Smoker    Quit date: 07/10/1981    Years since quitting: 37.7  . Smokeless tobacco: Never Used  Substance Use Topics  . Alcohol use: No  . Drug use: No     Allergies   Gadolinium derivatives   Review of Systems Review of Systems  Constitutional: Negative for fever.  HENT: Negative for rhinorrhea and  sore throat.   Eyes: Negative for redness.  Respiratory: Negative for cough.   Cardiovascular: Negative for chest pain.  Gastrointestinal: Negative for abdominal pain, diarrhea, nausea and vomiting.  Genitourinary: Negative for dysuria, frequency and hematuria.  Musculoskeletal: Negative for myalgias.  Skin: Negative for rash.  Neurological: Negative for headaches.     Physical Exam Updated Vital Signs BP (!) 133/50 (BP Location: Right Arm)   Pulse 62   Temp 98.4 F (36.9 C) (Oral)   Resp 16   SpO2 99%   Physical Exam Vitals signs and nursing note reviewed.  Constitutional:      Appearance: He is well-developed.  HENT:  Head: Normocephalic and atraumatic.  Eyes:     Conjunctiva/sclera: Conjunctivae normal.  Neck:     Musculoskeletal: Normal range of motion and neck supple.  Pulmonary:     Effort: No respiratory distress.  Genitourinary:    Comments: Foley catheter in place.  Urethral meatus appears normal.  There is dark yellow urine in the tubing and bag without any significant sediment.  No active leakage. Skin:    General: Skin is warm and dry.  Neurological:     Mental Status: He is alert.      ED Treatments / Results  Labs (all labs ordered are listed, but only abnormal results are displayed) Labs Reviewed  URINALYSIS, ROUTINE W REFLEX MICROSCOPIC - Abnormal; Notable for the following components:      Result Value   Hgb urine dipstick MODERATE (*)    Protein, ur 30 (*)    Leukocytes,Ua SMALL (*)    Bacteria, UA RARE (*)    All other components within normal limits    EKG None  Radiology No results found.  Procedures Procedures (including critical care time)  Medications Ordered in ED Medications - No data to display   Initial Impression / Assessment and Plan / ED Course  I have reviewed the triage vital signs and the nursing notes.  Pertinent labs & imaging results that were available during my care of the patient were reviewed by me and  considered in my medical decision making (see chart for details).        Patient seen and examined.  Patient has had the catheter in for approximately 1 week.  He is unclear about when he has follow-up with urologist.  Will discontinue Foley catheter and give the patient a chance to urinate normally.  If he is unable, will need to replace Foley catheter.  I encouraged him to follow-up with urology regardless of outcome here today.   Vital signs reviewed and are as follows: BP (!) 157/94   Pulse 67   Temp 98.4 F (36.9 C) (Oral)   Resp 16   SpO2 99%   8:27 PM bedside ultrasound used to estimate bladder volume.  This was about 100 cc.  Patient then had the urge to urinate and was able to void about 75 cc into a container.  Sent for UA.  9:09 PM UA reviewed.  I spent some time talking with the patient about various issues including constipation.  This is likely related to occasional use of narcotic pain medications, dehydration due to poor oral intake due to radiation esophagitis.  Patient is taking MiraLAX daily and has increased the fiber in his diet as he is able.  Will add glycerin suppositories.  These were prescribed.  I spoke with the patient's son.  He is agreeable to discharge to home.  I have given [facility] referral [facility] and encouraged follow-up when able to discuss BPH and urinary retention.  Discussed return to the emergency department with recurrent urinary retention or other concerns.  Ready for discharge home.  Final Clinical Impressions(s) / ED Diagnoses   Final diagnoses:  Foley catheter problem, initial encounter [facility])   Patient presents the emergency department after having a Foley catheter in place due to urinary retention for the past 7 days.  Patient had some leakage around the catheter.  Catheter was removed tonight.  Bladder scan and voiding as above.  UA without any unexpected findings.  Patient looks well.  He is agreeable to discharged home without  the  catheter at this point.  I feel this is appropriate.  Patient understands to return if he develops recurrent urinary retention.  Encourage PCP/urology follow-up given history of BPH and urinary retention.   ED Discharge Orders         Ordered    glycerin adult 2 g suppository  As needed     04/08/19 2056           Carlisle Cater, Hershal Coria 04/08/19 2111    Daleen Bo, MD 04/08/19 2351

## 2019-04-14 ENCOUNTER — Telehealth: Payer: Self-pay | Admitting: Oncology

## 2019-04-14 ENCOUNTER — Inpatient Hospital Stay: Payer: Medicare Other | Attending: Oncology | Admitting: Oncology

## 2019-04-14 ENCOUNTER — Inpatient Hospital Stay: Payer: Medicare Other

## 2019-04-14 ENCOUNTER — Other Ambulatory Visit: Payer: Self-pay

## 2019-04-14 DIAGNOSIS — R339 Retention of urine, unspecified: Secondary | ICD-10-CM | POA: Diagnosis not present

## 2019-04-14 DIAGNOSIS — I4891 Unspecified atrial fibrillation: Secondary | ICD-10-CM | POA: Insufficient documentation

## 2019-04-14 DIAGNOSIS — R531 Weakness: Secondary | ICD-10-CM | POA: Insufficient documentation

## 2019-04-14 DIAGNOSIS — R262 Difficulty in walking, not elsewhere classified: Secondary | ICD-10-CM | POA: Insufficient documentation

## 2019-04-14 DIAGNOSIS — N289 Disorder of kidney and ureter, unspecified: Secondary | ICD-10-CM | POA: Diagnosis not present

## 2019-04-14 DIAGNOSIS — I2510 Atherosclerotic heart disease of native coronary artery without angina pectoris: Secondary | ICD-10-CM | POA: Insufficient documentation

## 2019-04-14 DIAGNOSIS — C3412 Malignant neoplasm of upper lobe, left bronchus or lung: Secondary | ICD-10-CM

## 2019-04-14 DIAGNOSIS — J449 Chronic obstructive pulmonary disease, unspecified: Secondary | ICD-10-CM | POA: Insufficient documentation

## 2019-04-14 DIAGNOSIS — K208 Other esophagitis without bleeding: Secondary | ICD-10-CM | POA: Diagnosis not present

## 2019-04-14 DIAGNOSIS — Z85038 Personal history of other malignant neoplasm of large intestine: Secondary | ICD-10-CM | POA: Diagnosis present

## 2019-04-14 DIAGNOSIS — T66XXXA Radiation sickness, unspecified, initial encounter: Secondary | ICD-10-CM | POA: Diagnosis not present

## 2019-04-14 DIAGNOSIS — I255 Ischemic cardiomyopathy: Secondary | ICD-10-CM | POA: Diagnosis not present

## 2019-04-14 LAB — CBC WITH DIFFERENTIAL (CANCER CENTER ONLY)
Abs Immature Granulocytes: 0.03 10*3/uL (ref 0.00–0.07)
Basophils Absolute: 0 10*3/uL (ref 0.0–0.1)
Basophils Relative: 1 %
Eosinophils Absolute: 0.1 10*3/uL (ref 0.0–0.5)
Eosinophils Relative: 2 %
HCT: 32.1 % — ABNORMAL LOW (ref 39.0–52.0)
Hemoglobin: 10.4 g/dL — ABNORMAL LOW (ref 13.0–17.0)
Immature Granulocytes: 1 %
Lymphocytes Relative: 10 %
Lymphs Abs: 0.4 10*3/uL — ABNORMAL LOW (ref 0.7–4.0)
MCH: 29.8 pg (ref 26.0–34.0)
MCHC: 32.4 g/dL (ref 30.0–36.0)
MCV: 92 fL (ref 80.0–100.0)
Monocytes Absolute: 0.6 10*3/uL (ref 0.1–1.0)
Monocytes Relative: 13 %
Neutro Abs: 3.1 10*3/uL (ref 1.7–7.7)
Neutrophils Relative %: 73 %
Platelet Count: 151 10*3/uL (ref 150–400)
RBC: 3.49 MIL/uL — ABNORMAL LOW (ref 4.22–5.81)
RDW: 15.2 % (ref 11.5–15.5)
WBC Count: 4.2 10*3/uL (ref 4.0–10.5)
nRBC: 0 % (ref 0.0–0.2)

## 2019-04-14 MED ORDER — HYDROCODONE-ACETAMINOPHEN 7.5-325 MG/15ML PO SOLN: 5.0000 mL | Freq: Four times a day (QID) | ORAL | 0 refills | Status: DC | PRN

## 2019-04-14 NOTE — Telephone Encounter (Signed)
Scheduled per 10/05 los.

## 2019-04-14 NOTE — Progress Notes (Signed)
[facility] OFFICE PROGRESS NOTE   Diagnosis: Non-small cell lung cancer  INTERVAL HISTORY:   Mr. [place] returns as scheduled.  He completed radiation on 04/07/2019.  Continues to have odynophagia.  He is tolerating liquids.  He is eating chicken soup and an  almond milk supplement.  He continues to feel "weak ".  He has difficulty ambulating secondary to weakness. He was seen in the emergency room on 04/08/2019 for removal of the Foley catheter.  He is able to urinate. Objective:  Vital signs in last 24 hours:  Blood pressure 123/62, pulse 78, temperature 97.8 F (36.6 C), temperature source Temporal, resp. rate 17, height _0  (1.803 m), weight 151 lb 6.4 oz (68.7 kg), SpO2 98 %.    HEENT: No thrush or ulcers Resp: Lungs clear bilaterally Cardio: Regular rate and rhythm Vascular: No leg edema  Neurologic: Alert and oriented, the leg strength appears intact bilaterally, he ambulated to the examination table without difficulty  Lab Results:  Lab Results  Component Value Date   WBC 4.2 04/14/2019   HGB 10.4 (L) 04/14/2019   HCT 32.1 (L) 04/14/2019   MCV 92.0 04/14/2019   PLT 151 04/14/2019   NEUTROABS 3.1 04/14/2019    CMP  Lab Results  Component Value Date   NA 138 03/31/2019   K 3.8 03/31/2019   CL 102 03/31/2019   CO2 26 03/31/2019   GLUCOSE 127 (H) 03/31/2019   BUN 20 03/31/2019   CREATININE 1.12 03/31/2019   CALCIUM 9.0 03/31/2019   PROT 6.5 03/31/2019   ALBUMIN 3.6 03/31/2019   AST 18 03/31/2019   ALT 16 03/31/2019   ALKPHOS 126 03/31/2019   BILITOT 1.3 (H) 03/31/2019   GFRNONAA 59 (L) 03/31/2019   GFRAA >60 03/31/2019    Lab Results  Component Value Date   CEA1 1.64 11/12/2018    Lab Results  Component Value Date   INR 3.9 (A) 09/24/2018    Imaging:  No results found.  Medications: I have reviewed the patient's current medications.   Assessment/Plan: 1. Sigmoid colon cancer, stage II (T3N0), status post a sigmoidectomy  12/26/2017 ? Tumor involving the sigmoid colon and proximal rectum ? MSI-stable, no loss of mismatch repair protein expression ? Staging CTs of the chest, abdomen, and pelvis on 11/20/2017-focal wall thickening at the distal sigmoid colon, no evidence of metastatic disease, indeterminate 1.9 cm sub-solid left upper lobe nodule 2. Coronary artery disease 3. Atrial fibrillation 4. BPH 5. Left arm/hand intention tremor 6. Report of balance difficulty 7. COPD 8. Indeterminate left upper lobe nodule on the staging chest CT 11/20/2017  Increase in part solid nodule in the left upper lobe, stable groundglass nodule in the superior segment of the left lower lobe  CT biopsy- adenocarcinoma consistent with lung primary, MSS, tumor mutation burden 13, BRAF G469A, PDL-1 TPS 5%. No EGFR, ALK, ERBB2, ROS alteratation  SBRT 08/14/2018-08/23/2018, 5 fractions  CT chest 10/23/2018 -2.6 x 1.7 cm left upper lobe nodule-enlarged, 13 mm left suprahilar node-possibly new, 9 mm AP window node previously 6 mm  CT chest 01/23/2019-new mediastinal and progressive hilar adenopathy,  PET scan 02/03/2019-hypermetabolic mediastinal and left hilar lymph nodes, hypermetabolic left upper lobe mass, no evidence of distant metastatic disease  MRI brain 02/12/2019-no metastatic disease  Radiation beginning 02/24/2019  Cycle 1 weekly Taxol/carboplatin 02/24/2019  Cycle 2 weekly Taxol/carboplatin 03/04/2019  Cycle 3 weekly Taxol/carboplatin 03/18/2019 (Taxol and carboplatin dose reduced secondary to neutropenia following cycle 2)  Cycle 4 weekly Taxol/carboplatin  03/31/2019  Radiation completed 04/07/2019 9. Renal insufficiency 10.Odynophagia secondary to radiation esophagitis    Disposition: [name] completed the course of chemotherapy and radiation.  He continues to feel weak and he has persistent odynophagia.  His symptoms should improve over the next few weeks.  He will continue to increase his diet as tolerated.   He is scheduled for follow-up in urology tomorrow for evaluation and management of urinary retention.  He will return for an office visit in approximately 3 weeks.  I refilled his prescription for hydrocodone.  Betsy Coder, MD  04/14/2019  10:43 AM

## 2019-04-15 DIAGNOSIS — N401 Enlarged prostate with lower urinary tract symptoms: Secondary | ICD-10-CM | POA: Diagnosis not present

## 2019-04-15 DIAGNOSIS — R339 Retention of urine, unspecified: Secondary | ICD-10-CM | POA: Diagnosis not present

## 2019-04-15 DIAGNOSIS — R1319 Other dysphagia: Secondary | ICD-10-CM | POA: Diagnosis not present

## 2019-04-15 DIAGNOSIS — D6869 Other thrombophilia: Secondary | ICD-10-CM | POA: Diagnosis not present

## 2019-04-15 DIAGNOSIS — K5900 Constipation, unspecified: Secondary | ICD-10-CM | POA: Diagnosis not present

## 2019-04-15 DIAGNOSIS — E46 Unspecified protein-calorie malnutrition: Secondary | ICD-10-CM | POA: Diagnosis not present

## 2019-04-15 DIAGNOSIS — Z23 Encounter for immunization: Secondary | ICD-10-CM | POA: Diagnosis not present

## 2019-04-15 DIAGNOSIS — C771 Secondary and unspecified malignant neoplasm of intrathoracic lymph nodes: Secondary | ICD-10-CM | POA: Diagnosis not present

## 2019-04-15 DIAGNOSIS — I480 Paroxysmal atrial fibrillation: Secondary | ICD-10-CM | POA: Diagnosis not present

## 2019-04-15 DIAGNOSIS — R3912 Poor urinary stream: Secondary | ICD-10-CM | POA: Diagnosis not present

## 2019-04-15 DIAGNOSIS — C3412 Malignant neoplasm of upper lobe, left bronchus or lung: Secondary | ICD-10-CM | POA: Diagnosis not present

## 2019-05-05 ENCOUNTER — Inpatient Hospital Stay (HOSPITAL_BASED_OUTPATIENT_CLINIC_OR_DEPARTMENT_OTHER): Payer: Medicare Other | Admitting: Oncology

## 2019-05-05 ENCOUNTER — Inpatient Hospital Stay: Payer: Medicare Other

## 2019-05-05 ENCOUNTER — Telehealth: Payer: Self-pay | Admitting: *Deleted

## 2019-05-05 ENCOUNTER — Other Ambulatory Visit: Payer: Self-pay

## 2019-05-05 ENCOUNTER — Telehealth: Payer: Self-pay | Admitting: Oncology

## 2019-05-05 VITALS — BP 108/89 | HR 61 | Temp 98.2°F | Resp 18 | Ht 71.0 in | Wt 155.1 lb

## 2019-05-05 DIAGNOSIS — R300 Dysuria: Secondary | ICD-10-CM | POA: Diagnosis not present

## 2019-05-05 DIAGNOSIS — I255 Ischemic cardiomyopathy: Secondary | ICD-10-CM

## 2019-05-05 DIAGNOSIS — J449 Chronic obstructive pulmonary disease, unspecified: Secondary | ICD-10-CM | POA: Diagnosis not present

## 2019-05-05 DIAGNOSIS — N289 Disorder of kidney and ureter, unspecified: Secondary | ICD-10-CM | POA: Diagnosis not present

## 2019-05-05 DIAGNOSIS — C3412 Malignant neoplasm of upper lobe, left bronchus or lung: Secondary | ICD-10-CM

## 2019-05-05 DIAGNOSIS — R339 Retention of urine, unspecified: Secondary | ICD-10-CM | POA: Diagnosis not present

## 2019-05-05 DIAGNOSIS — I4891 Unspecified atrial fibrillation: Secondary | ICD-10-CM | POA: Diagnosis not present

## 2019-05-05 DIAGNOSIS — I2510 Atherosclerotic heart disease of native coronary artery without angina pectoris: Secondary | ICD-10-CM | POA: Diagnosis not present

## 2019-05-05 LAB — URINALYSIS, COMPLETE (UACMP) WITH MICROSCOPIC
Bilirubin Urine: NEGATIVE
Glucose, UA: NEGATIVE mg/dL
Ketones, ur: NEGATIVE mg/dL
Nitrite: NEGATIVE
Protein, ur: NEGATIVE mg/dL
Specific Gravity, Urine: 1.019 (ref 1.005–1.030)
WBC, UA: >50 WBC/hpf — ABNORMAL HIGH (ref 0–5)
pH: 5 (ref 5.0–8.0)

## 2019-05-05 MED ORDER — CIPROFLOXACIN HCL 500 MG PO TABS: 500.0000 mg | ORAL_TABLET | Freq: Two times a day (BID) | ORAL | 0 refills | Status: DC

## 2019-05-05 NOTE — Telephone Encounter (Signed)
-----   Message from Ladell Pier, MD sent at 05/05/2019  1:35 PM EDT ----- Please call patient, Urine consistent with URI, start cipro 500mg  bid for 5 days, f/u culture

## 2019-05-05 NOTE — Telephone Encounter (Signed)
Gave patient avs report and appointments for November.  °

## 2019-05-05 NOTE — Progress Notes (Signed)
[place] OFFICE PROGRESS NOTE   Diagnosis: Lung cancer  INTERVAL HISTORY:   [name] returns as scheduled.  He is here today with his daughter.  He complains of persistent odynophagia.  He is eating liquids and some solids.  His daughter reports his oral intake has improved at his home.  He is getting out of the house.  He complains of still feeling weak with difficulty ambulating.  He reports feeling better after food "moves past "his stomach.  He continues to have intermittent pain at the lower neck, but he is no longer taking pain medication.  He was recently started on finasteride and tamsulosin for BPH symptoms.  He recently had a small amount of hematuria and passed a small clot.  Objective:  Vital signs in last 24 hours:  Blood pressure 108/89, pulse 61, temperature 98.2 F (36.8 C), temperature source Temporal, resp. rate 18, height _0  (1.803 m), weight 155 lb 1.6 oz (70.4 kg), SpO2 99 %.    Resp: Lungs clear bilaterally Cardio: Regular rate and rhythm GI: No hepatomegaly, no mass, nontender Vascular: No leg edema Neuro: He ambulated to the examination table without difficulty, alert and oriented Skin: Resolving radiation hyperpigmentation at the left upper back   Lab Results:  Lab Results  Component Value Date   WBC 4.2 04/14/2019   HGB 10.4 (L) 04/14/2019   HCT 32.1 (L) 04/14/2019   MCV 92.0 04/14/2019   PLT 151 04/14/2019   NEUTROABS 3.1 04/14/2019    CMP  Lab Results  Component Value Date   NA 138 03/31/2019   K 3.8 03/31/2019   CL 102 03/31/2019   CO2 26 03/31/2019   GLUCOSE 127 (H) 03/31/2019   BUN 20 03/31/2019   CREATININE 1.12 03/31/2019   CALCIUM 9.0 03/31/2019   PROT 6.5 03/31/2019   ALBUMIN 3.6 03/31/2019   AST 18 03/31/2019   ALT 16 03/31/2019   ALKPHOS 126 03/31/2019   BILITOT 1.3 (H) 03/31/2019   GFRNONAA 59 (L) 03/31/2019   GFRAA >60 03/31/2019    Lab Results  Component Value Date   CEA1 1.64 11/12/2018      Medications: I have reviewed the patient's current medications.   Assessment/Plan: 1. Sigmoid colon cancer, stage II (T3N0), status post a sigmoidectomy 12/26/2017 ? Tumor involving the sigmoid colon and proximal rectum ? MSI-stable, no loss of mismatch repair protein expression ? Staging CTs of the chest, abdomen, and pelvis on 11/20/2017-focal wall thickening at the distal sigmoid colon, no evidence of metastatic disease, indeterminate 1.9 cm sub-solid left upper lobe nodule 2. Coronary artery disease 3. Atrial fibrillation 4. BPH 5. Left arm/hand intention tremor 6. Report of balance difficulty 7. COPD 8. Indeterminate left upper lobe nodule on the staging chest CT 11/20/2017  Increase in part solid nodule in the left upper lobe, stable groundglass nodule in the superior segment of the left lower lobe  CT biopsy- adenocarcinoma consistent with lung primary, MSS, tumor mutation burden 13, BRAF G469A, PDL-1 TPS 5%. No EGFR, ALK, ERBB2, ROS alteratation  SBRT 08/14/2018-08/23/2018, 5 fractions  CT chest 10/23/2018 -2.6 x 1.7 cm left upper lobe nodule-enlarged, 13 mm left suprahilar node-possibly new, 9 mm AP window node previously 6 mm  CT chest 01/23/2019-new mediastinal and progressive hilar adenopathy,  PET scan 02/03/2019-hypermetabolic mediastinal and left hilar lymph nodes, hypermetabolic left upper lobe mass, no evidence of distant metastatic disease  MRI brain 02/12/2019-no metastatic disease  Radiation beginning 02/24/2019  Cycle 1 weekly Taxol/carboplatin 02/24/2019  Cycle 2 weekly Taxol/carboplatin 03/04/2019  Cycle 3 weekly Taxol/carboplatin 03/18/2019 (Taxol and carboplatin dose reduced secondary to neutropenia following cycle 2)  Cycle 4 weekly Taxol/carboplatin 03/31/2019  Radiation completed 04/07/2019 9. Renal insufficiency 10.Odynophagia secondary to radiation esophagitis     Disposition: Mr. Myhand continues to recover from the course of chemotherapy and  radiation.  He has gained weight over the past several weeks.  Hopefully the odynophagia will improve over the next few weeks.  We will make a GI referral to consider an endoscopy if the odynophagia does not improve.  He declined antiacid therapy.  We checked a urinalysis today.  He appears to have a urinary tract infection.  We submitted a culture.  He will begin a course of ciprofloxacin.  Mr. Rodman will return for an office visit in 1 month.  Betsy Coder, MD  05/05/2019  3:59 PM

## 2019-05-05 NOTE — Telephone Encounter (Signed)
Notified that UA is consistent with UTI and script for Cipro will be sent to [facility]. He understands and agrees.

## 2019-05-06 LAB — URINE CULTURE: Culture: <10000 — AB

## 2019-05-07 ENCOUNTER — Telehealth: Payer: Self-pay | Admitting: *Deleted

## 2019-05-07 NOTE — Telephone Encounter (Signed)
Notified of message below

## 2019-05-07 NOTE — Telephone Encounter (Signed)
-----   Message from Ladell Pier, MD sent at 05/06/2019  5:03 PM EDT ----- Please call patient, urine culture is negative, complete course of ciprofloxacin as prescribed, follow-up with Dr. Junious Silk to evaluate the hematuria

## 2019-05-19 ENCOUNTER — Telehealth: Payer: Self-pay | Admitting: Radiation Oncology

## 2019-05-19 NOTE — Telephone Encounter (Signed)
Radiation Oncology         [phone] ________________________________  Name: [name] MRN: [id number]  Date of Service: 05/19/2019  DOB: Mar 07, 1930  Post Treatment Telephone Note  Diagnosis:  Progressive Stage IA3, cT1cN0M0, NSCLC, adenocarcinoma of the LUL of the lung  Interval Since Last Radiation:  6 weeks   02/24/2019-04/07/2019:  The mediastinum was treated to 60 Gy in 30 fractions  08/14/2018-08/23/2018 SBRT Treatment. The tumor in the [place] treated with a course of stereotactic body radiation treatment. The patient received 60Gy in42fractions at 12Gyper fraction.   Narrative:  [name] is a 85 y.o. male originally seen at the request of Dr. Servando Snare for a newly diagnosed NSCLC of the RUL with a history of pT3N0 adenocarcinoma of the sigmoid colon followed by Dr. Benay Spice. The patient was found to have an adenocarcinoma of the sigmoid colon in the spring of 2019. His work up with CT chest on 11/20/17 revealed a nodule measuring 1.9 cm in the RUL, and he went on to proceed with robotic sigmoidectomy on 12/26/17 with Dr. Marcello Moores and this revealed a T3N0 cancer. He was counseled on surveillance, and he had repeat CT of the chest on 06/03/18 that revealed the lesion in the LUL measuring 2.1 x 1.4 cm and there was a solid component in the nodule that was larger at 1.3 cm, compared to 5 mm on prior scan. There was also a stable ground glass nodule also seen in the LLL measuring 1.4 cm. He was counseled on a PET scan and this was performed on 06/26/18 and revealed hypermetabolic change in the stomach with an SUV of 14.2, and hypermetabolism in the LUL lesion with an SUV of 4.5. He did undergo a CT guided biopsy on 07/09/18 that revealed an adenocarcinoma consistent with lung primary. He had a complicated course following his biopsy requiring a chest tube and hospitalization. His chest tube was dislodged accidentally by the patient and had to be replaced. He was not a surgical  candidate and received SBRT. Unfortunately surveillance imaging has shown an increase in the size of his thoracic nodes. April 2020's scan revealed a left suprahilar nodes at 13 mm, and 9 mm AP window node previously 6 mm. It was felt that these could be reactive. He had repeat imaging at a shorter interval to further evaluate this and on 01/23/2019 which showed an increase in his adenopathy, the prevascular node was 17 mm, left paratracheal node 18 mm, and left hilar node 16 mm. A PET scan yesterday revealed hypermetabolism in those nodes and in the LUL previously treated site but no metastatic disease. He completed chemoRT on 04/07/2019. He did have significant esophagitis. He was given carafate but did not use this as often as he felt it didn't help him much.  He is contacted by phone today to discuss his course. He reports he still has some indigestion/reflux symptoms but would like to avoid taking any further medication for this..  Impression/Plan: 1. Progressive Stage IA3, cT1cN0M0, NSCLC, adenocarcinoma of the LUL of the lung. The patient has been doing well since completion of radiotherapy. He is slowly noticing improvement in his esophagitis. We discussed that we would be happy to continue to follow him as needed, but he will also continue to follow up with Dr. Learta Codding in medical oncology. I did ask him to reconsider about using a PPI for his reflux symptoms but he is reluctant currently.     Carola Rhine, PAC

## 2019-05-20 ENCOUNTER — Telehealth: Payer: Self-pay | Admitting: *Deleted

## 2019-05-20 ENCOUNTER — Telehealth: Payer: Self-pay | Admitting: Medical

## 2019-05-20 DIAGNOSIS — C3412 Malignant neoplasm of upper lobe, left bronchus or lung: Secondary | ICD-10-CM

## 2019-05-20 NOTE — Telephone Encounter (Addendum)
Reports over past 4-5 days having upper chest congestion with dry cough. Chest feels tight. Mild shortness of breath with exertion. No fever. No increase in fatigue, weakness or muscle aches. Asking to be seen tomorrow and have CXR. Last radiation tx for lung cancer was 04/07/19. Per Dr. Benay Spice: OK for CXR and to be seen in [facility] tomorrow afterwards.  Called patient back and informed him he will be called w/appointment for tomorrow. Go to [facility] for CXR 1 hour prior. Scheduler will be calling him soon.

## 2019-05-20 NOTE — Telephone Encounter (Signed)
Scheduled appt per 11/10 sch message - pt is aware of appt date and time

## 2019-05-21 ENCOUNTER — Other Ambulatory Visit: Payer: Self-pay

## 2019-05-21 ENCOUNTER — Ambulatory Visit (HOSPITAL_COMMUNITY)
Admission: RE | Admit: 2019-05-21 | Discharge: 2019-05-21 | Disposition: A | Discharge status: Home / Self Care | Payer: Medicare Other | Source: Ambulatory Visit | Attending: Medical | Admitting: Medical

## 2019-05-21 ENCOUNTER — Inpatient Hospital Stay: Payer: Medicare Other

## 2019-05-21 ENCOUNTER — Other Ambulatory Visit: Payer: Self-pay | Admitting: Emergency Medicine

## 2019-05-21 ENCOUNTER — Inpatient Hospital Stay: Payer: Medicare Other | Attending: Oncology | Admitting: Medical

## 2019-05-21 VITALS — BP 109/79 | HR 60 | Temp 98.1°F | Resp 18 | Ht 71.0 in

## 2019-05-21 DIAGNOSIS — M791 Myalgia, unspecified site: Secondary | ICD-10-CM

## 2019-05-21 DIAGNOSIS — R059 Cough, unspecified: Secondary | ICD-10-CM

## 2019-05-21 DIAGNOSIS — I4891 Unspecified atrial fibrillation: Secondary | ICD-10-CM | POA: Insufficient documentation

## 2019-05-21 DIAGNOSIS — N400 Enlarged prostate without lower urinary tract symptoms: Secondary | ICD-10-CM | POA: Diagnosis not present

## 2019-05-21 DIAGNOSIS — I950 Idiopathic hypotension: Secondary | ICD-10-CM | POA: Insufficient documentation

## 2019-05-21 DIAGNOSIS — G252 Other specified forms of tremor: Secondary | ICD-10-CM | POA: Insufficient documentation

## 2019-05-21 DIAGNOSIS — J449 Chronic obstructive pulmonary disease, unspecified: Secondary | ICD-10-CM | POA: Insufficient documentation

## 2019-05-21 DIAGNOSIS — R0600 Dyspnea, unspecified: Secondary | ICD-10-CM | POA: Diagnosis not present

## 2019-05-21 DIAGNOSIS — R0609 Other forms of dyspnea: Secondary | ICD-10-CM

## 2019-05-21 DIAGNOSIS — C3412 Malignant neoplasm of upper lobe, left bronchus or lung: Secondary | ICD-10-CM

## 2019-05-21 DIAGNOSIS — R5383 Other fatigue: Secondary | ICD-10-CM

## 2019-05-21 DIAGNOSIS — Y842 Radiological procedure and radiotherapy as the cause of abnormal reaction of the patient, or of later complication, without mention of misadventure at the time of the procedure: Secondary | ICD-10-CM | POA: Diagnosis not present

## 2019-05-21 DIAGNOSIS — R05 Cough: Secondary | ICD-10-CM

## 2019-05-21 DIAGNOSIS — K2100 Gastro-esophageal reflux disease with esophagitis, without bleeding: Secondary | ICD-10-CM

## 2019-05-21 DIAGNOSIS — I2510 Atherosclerotic heart disease of native coronary artery without angina pectoris: Secondary | ICD-10-CM | POA: Diagnosis not present

## 2019-05-21 DIAGNOSIS — K208 Other esophagitis without bleeding: Secondary | ICD-10-CM | POA: Diagnosis not present

## 2019-05-21 DIAGNOSIS — R530 Neoplastic (malignant) related fatigue: Secondary | ICD-10-CM | POA: Diagnosis not present

## 2019-05-21 LAB — CBC WITH DIFFERENTIAL (CANCER CENTER ONLY)
Abs Immature Granulocytes: 0.05 10*3/uL (ref 0.00–0.07)
Basophils Absolute: 0 10*3/uL (ref 0.0–0.1)
Basophils Relative: 1 %
Eosinophils Absolute: 0.1 10*3/uL (ref 0.0–0.5)
Eosinophils Relative: 2 %
HCT: 28.8 % — ABNORMAL LOW (ref 39.0–52.0)
Hemoglobin: 9.1 g/dL — ABNORMAL LOW (ref 13.0–17.0)
Immature Granulocytes: 1 %
Lymphocytes Relative: 11 %
Lymphs Abs: 0.6 10*3/uL — ABNORMAL LOW (ref 0.7–4.0)
MCH: 29.4 pg (ref 26.0–34.0)
MCHC: 31.6 g/dL (ref 30.0–36.0)
MCV: 92.9 fL (ref 80.0–100.0)
Monocytes Absolute: 0.7 10*3/uL (ref 0.1–1.0)
Monocytes Relative: 14 %
Neutro Abs: 3.9 10*3/uL (ref 1.7–7.7)
Neutrophils Relative %: 71 %
Platelet Count: 244 10*3/uL (ref 150–400)
RBC: 3.1 MIL/uL — ABNORMAL LOW (ref 4.22–5.81)
RDW: 14.6 % (ref 11.5–15.5)
WBC Count: 5.4 10*3/uL (ref 4.0–10.5)
nRBC: 0 % (ref 0.0–0.2)

## 2019-05-21 LAB — CMP (CANCER CENTER ONLY)
ALT: 15 U/L (ref 0–44)
AST: 16 U/L (ref 15–41)
Albumin: 2.5 g/dL — ABNORMAL LOW (ref 3.5–5.0)
Alkaline Phosphatase: 85 U/L (ref 38–126)
Anion gap: 8 (ref 5–15)
BUN: 24 mg/dL — ABNORMAL HIGH (ref 8–23)
CO2: 26 mmol/L (ref 22–32)
Calcium: 8.5 mg/dL — ABNORMAL LOW (ref 8.9–10.3)
Chloride: 102 mmol/L (ref 98–111)
Creatinine: 0.96 mg/dL (ref 0.61–1.24)
GFR, Est AFR Am: >60 mL/min (ref >60)
GFR, Estimated: >60 mL/min (ref >60)
Glucose, Bld: 102 mg/dL — ABNORMAL HIGH (ref 70–99)
Potassium: 4.3 mmol/L (ref 3.5–5.1)
Sodium: 136 mmol/L (ref 135–145)
Total Bilirubin: 0.8 mg/dL (ref 0.3–1.2)
Total Protein: 6.2 g/dL — ABNORMAL LOW (ref 6.5–8.1)

## 2019-05-21 MED ORDER — PREDNISONE 10 MG PO TABS: ORAL_TABLET | ORAL | 0 refills | Status: DC

## 2019-05-21 MED ORDER — SODIUM CHLORIDE 0.9 % IV SOLN
Freq: Once | INTRAVENOUS | Status: AC
  Administered 2019-05-21: 11:00:00 via INTRAVENOUS
  Filled 2019-05-21: qty 250

## 2019-05-21 MED ORDER — OMEPRAZOLE 40 MG PO CPDR: 40.0000 mg | DELAYED_RELEASE_CAPSULE | Freq: Every day | ORAL | 5 refills | Status: DC

## 2019-05-21 MED ORDER — DOXYCYCLINE HYCLATE 100 MG PO TABS: 100.0000 mg | ORAL_TABLET | Freq: Two times a day (BID) | ORAL | 0 refills | Status: DC

## 2019-05-21 NOTE — Patient Instructions (Addendum)

## 2019-05-21 NOTE — Progress Notes (Signed)
Pt transported to radiology dept for CXR and returned to [facility] 26 with belongings to discuss results.  Pt reports feeling better at end of infusion.  VSS.  Son called by PA Lucianne Lei and made aware of pt's tx plan for today, picked pt up at end of infusion.

## 2019-05-21 NOTE — Progress Notes (Signed)
[facility] Progress Note   [name] [id number] [birth year] 85 y.o.  [name] is managed by Dr. Dominica Severin B. Sherrill  Actively treated with chemotherapy/immunotherapy/hormonal therapy: Status post chemoradiation with week 4 of Taxol and carboplatin completed on 03/31/2019 and radiation completed on 06/07/2019.  Last treated: Status post chemoradiation with week 4 of Taxol and carboplatin completed on 03/31/2019 and radiation completed on 06/07/2019.  Next scheduled appointment with provider: 06/02/2019  Assessment: Plan:    Malignant neoplasm of bronchus of left upper lobe (HCC)  Dyspnea on exertion - Plan: CBC with Differential ([place]), CMP ([place] only)  Neoplastic malignant related fatigue  Myalgia - Plan: CBC with Differential ([place] Only), CMP ([place] only)  Cough  Idiopathic hypotension - Plan: 0.9 %  sodium chloride infusion  Other fatigue - Plan: CBC with Differential ([place] Only)  Gastroesophageal reflux disease with esophagitis without hemorrhage   Malignant neoplasm of the bronchus of the left upper lobe: [name] is followed by Dr. Benay Spice and is status post chemoradiation with week 4 of Taxol and carboplatin completed on 03/31/2019 and radiation completed on 06/07/2019.  He is scheduled to be seen in follow-up on 06/02/2019.   Dyspnea on exertion, fatigue, myalgia, and nonproductive cough: Patient was referred for chest x-ray.  Patient's x-ray returned showing:  Findings concerning for progression of the known left upper lobe mass with spread of tumor to the adjacent lung parenchyma versus development of superimposed pneumonia. Clinical correlation is recommended. CT may provide better evaluation.  This was reviewed with radiation oncology and with Dr. Benay Spice.  Based on input, the patient was given a prescription for doxycycline 100 mg p.o. twice daily x7 days and was also given a 29-day  steroid taper.  This was discussed with the patient and with his son.   Hypotension suspected secondary to dehydration: The patient was blood pressure was noted to be 98/48.  His labs returned showing a BUN slightly elevated 24.  He was given 1 L of normal saline IV today with improvement in his blood pressure.  The patient was instructed to push fluids.  This was also discussed with the patient's son.   GERD with suspected esophagitis secondary to radiation therapy: The patient was given a prescription for Prilosec 40 mg once daily.  He has Carafate at home but reports that it makes him nauseated.  Please see After Visit Summary for patient specific instructions.  Future Appointments  Date Time Provider [place]  05/26/2019  3:40 PM Burnell Blanks, MD [facility]  06/02/2019  8:00 AM Benay Spice [facility] Price, [facility] None    Orders Placed This Encounter  Procedures   CBC with Differential ([place])   CMP ([place] only)       Subjective:   Patient ID:  [name] is a 85 y.o. (DOB 1930-02-25) male.  Chief Complaint:  Chief Complaint  Patient presents with   Chest Congestion    HPI [name]  Is a 85 y.o. male with a diagnosis of malignant neoplasm of the bronchus of the left upper lobe.  He is managed by Dr. Benay Spice and is status post chemoradiation with week 4 of Taxol and carboplatin completed on 03/31/2019 and radiation completed on 06/07/2019.  The call was received by Dr. Gearldine Shown nurse yesterday stating that the patient had 4 to 5 days of a nonproductive cough with chest congestion.  It was reported the patient's chest felt  tight.  He was having mild shortness of breath with exertion.  She reported fatigue, weakness, and myalgias.  He had no fever.  He was sent for chest x-ray this morning which returned showing:  Findings concerning for progression of the known left upper lobe mass with spread of tumor to the  adjacent lung parenchyma versus development of superimposed pneumonia. Clinical correlation is recommended. CT may provide better evaluation.  This was reviewed with radiation oncology and with Dr. Benay Spice.  The both believe that the findings on the patient's chest x-ray could represent a radiation pneumonitis given that this generally occurs 4 to 6 weeks after radiation is completed.  Given that he could have an underlying pneumonia it was decided that he should also be treated with an antibiotic.  Based on input, the patient was given a prescription for doxycycline 100 mg p.o. twice daily x7 days and was also given a 29-day steroid taper.    [name] reports having ongoing GERD and esophageal pain with most p.o. intake.  He had a prescription at home for Carafate but reports that it makes him nauseated so he has not been taking it at all.  Medications: I have reviewed the patient's current medications.  Allergies:  Allergies  Allergen Reactions   Gadolinium Derivatives Hives, Itching and Other (See Comments)    Dr. Jeralyn Ruths s/w [name]. We observed him for 15 minutes. He did not need to take Benadryl. The symptoms began to subside before he left.     Past Medical History:  Diagnosis Date   A-fib [facility])    Arthritis    BPH (benign prostatic hyperplasia)    Cancer (HCC)    colon cancer    Chest pain, unspecified    Chronic kidney disease    Coronary atherosclerosis of unspecified type of vessel, native or graft    Dizziness and giddiness    Hyperlipidemia    Hypertension    Lung cancer ([place])    bx's done 08/09/2017   Myocardial infarction ([place]) 12 years ago   x2   Non-ST elevation (NSTEMI) myocardial infarction ([place]) 08/2017   dx afib,replaced stent   Peripheral neuropathy    PVD (peripheral vascular disease) (HCC)    Shortness of breath    exertion   Vitamin B12 deficiency     Past Surgical History:  Procedure Laterality Date   COLON SURGERY      partial colectomy Dr. Marcello Moores 12-14-17   CORONARY ANGIOPLASTY     4 stents   CORONARY STENT INTERVENTION N/A 08/09/2017   Procedure: CORONARY STENT INTERVENTION;  Surgeon: Nelva Bush, MD;  Location: [place] [facility];  Service: Cardiovascular;  Laterality: N/A;   FLEXIBLE SIGMOIDOSCOPY N/A 11/12/2017   Procedure: FLEXIBLE SIGMOIDOSCOPY;  Surgeon: Irene Shipper, MD;  Location: [facility];  Service: Endoscopy;  Laterality: N/A;   HERNIA REPAIR     right inguinal   INGUINAL HERNIA REPAIR  06/14/2012   Procedure: HERNIA REPAIR INGUINAL ADULT;  Surgeon: Odis Hollingshead, MD;  Location: [place];  Service: General;  Laterality: Right;   INSERTION OF MESH  06/14/2012   Procedure: INSERTION OF MESH;  Surgeon: Odis Hollingshead, MD;  Location: [place];  Service: General;  Laterality: Right;   LAPAROSCOPIC PARTIAL COLECTOMY N/A 12/26/2017   Procedure: LAPAROSCOPIC PARTIAL COLECTOMY ERAS PATHWAY;  Surgeon: Leighton Ruff, MD;  Location: [facility];  Service: General;  Laterality: N/A;   LEFT HEART CATH AND CORONARY ANGIOGRAPHY N/A 08/09/2017   Procedure: LEFT HEART  CATH AND CORONARY ANGIOGRAPHY;  Surgeon: Nelva Bush, MD;  Location: [place] [facility];  Service: Cardiovascular;  Laterality: N/A;   PROCTOSCOPY N/A 12/26/2017   Procedure: PROCTOSCOPY;  Surgeon: Leighton Ruff, MD;  Location: [facility];  Service: General;  Laterality: N/A;   SINUS SURGERY WITH INSTATRAK  1985    Family History  Problem Relation Age of Onset   Heart attack Father 76   Arthritis Sister    Hypothyroidism Daughter    Colon cancer Neg Hx    Esophageal cancer Neg Hx    Rectal cancer Neg Hx    Stomach cancer Neg Hx     Social History   Socioeconomic History   Marital status: Divorced    Spouse name: Not on file   Number of children: 3   Years of education: Not on file   Highest education level: Not on file  Occupational History   Occupation: Magazine features editor    Comment: [place] resource strain: Not on file   Food insecurity    Worry: Not on file    Inability: Not on file   Transportation needs    Medical: No    Non-medical: No  Tobacco Use   Smoking status: Former Smoker    Quit date: 07/10/1981    Years since quitting: 37.8   Smokeless tobacco: Never Used  Substance and Sexual Activity   Alcohol use: No   Drug use: No   Sexual activity: Not on file  Lifestyle   Physical activity    Days per week: Not on file    Minutes per session: Not on file   Stress: Not on file  Relationships   Social connections    Talks on phone: Not on file    Gets together: Not on file    Attends religious service: Not on file    Active member of club or organization: Not on file    Attends meetings of clubs or organizations: Not on file    Relationship status: Not on file   Intimate partner violence    Fear of current or ex partner: Not on file    Emotionally abused: Not on file    Physically abused: Not on file    Forced sexual activity: Not on file  Other Topics Concern   Not on file  Social History Narrative   Regular exercise          Past Medical History, Surgical history, Social history, and Family history were reviewed and updated as appropriate.   Please see review of systems for further details on the patient's review from today.   Review of Systems:  Review of Systems  Constitutional: Positive for fatigue. Negative for chills, diaphoresis and fever.  HENT: Positive for trouble swallowing (Esophagitis). Negative for congestion, postnasal drip, rhinorrhea and sore throat.   Respiratory: Positive for cough and shortness of breath. Negative for wheezing.   Cardiovascular: Negative for chest pain and palpitations.  Gastrointestinal: Negative for constipation, diarrhea, nausea and vomiting.  Musculoskeletal: Positive for myalgias.  Neurological: Positive for weakness. Negative for headaches.    Objective:    Physical Exam:  BP 109/79    Pulse 60    Temp 98.1 F (36.7 C) (Temporal)    Resp 18    Ht 5\' 11"  (1.803 m)    SpO2 100%    BMI 21.63 kg/m  ECOG: 1  Physical Exam Constitutional:      General: He  is not in acute distress.    Appearance: He is not diaphoretic.  HENT:     Head: Normocephalic and atraumatic.  Eyes:     General: No scleral icterus.       Right eye: No discharge.        Left eye: No discharge.     Conjunctiva/sclera: Conjunctivae normal.  Cardiovascular:     Rate and Rhythm: Normal rate and regular rhythm.     Heart sounds: Normal heart sounds. No murmur. No friction rub. No gallop.   Pulmonary:     Effort: Pulmonary effort is normal. No respiratory distress.     Breath sounds: Normal breath sounds. No wheezing or rales.  Skin:    General: Skin is warm and dry.     Findings: No erythema or rash.  Neurological:     Mental Status: He is alert.     Gait: Gait abnormal (The patient has a shuffling gait and is ambulating with a wheelchair.).  Psychiatric:        Mood and Affect: Mood normal.        Behavior: Behavior normal.        Thought Content: Thought content normal.        Judgment: Judgment normal.     Lab Review:     Component Value Date/Time   NA 136 05/21/2019 1124   NA 140 12/05/2018 1006   K 4.3 05/21/2019 1124   CL 102 05/21/2019 1124   CO2 26 05/21/2019 1124   GLUCOSE 102 (H) 05/21/2019 1124   BUN 24 (H) 05/21/2019 1124   BUN 30 (H) 12/05/2018 1006   CREATININE 0.96 05/21/2019 1124   CREATININE 1.36 (H) 06/14/2016 1133   CALCIUM 8.5 (L) 05/21/2019 1124   PROT 6.2 (L) 05/21/2019 1124   ALBUMIN 2.5 (L) 05/21/2019 1124   AST 16 05/21/2019 1124   ALT 15 05/21/2019 1124   ALKPHOS 85 05/21/2019 1124   BILITOT 0.8 05/21/2019 1124   GFRNONAA >60 05/21/2019 1124   GFRAA >60 05/21/2019 1124       Component Value Date/Time   WBC 5.4 05/21/2019 1124   WBC 8.4 07/13/2018 0303   RBC 3.10 (L) 05/21/2019 1124   HGB 9.1 (L) 05/21/2019 1124    HGB 13.2 12/05/2018 1006   HCT 28.8 (L) 05/21/2019 1124   HCT 41.0 12/05/2018 1006   PLT 244 05/21/2019 1124   PLT 201 12/05/2018 1006   MCV 92.9 05/21/2019 1124   MCV 91 12/05/2018 1006   MCH 29.4 05/21/2019 1124   MCHC 31.6 05/21/2019 1124   RDW 14.6 05/21/2019 1124   RDW 13.2 12/05/2018 1006   LYMPHSABS 0.6 (L) 05/21/2019 1124   MONOABS 0.7 05/21/2019 1124   EOSABS 0.1 05/21/2019 1124   BASOSABS 0.0 05/21/2019 1124   -------------------------------  Imaging from last 24 hours (if applicable):  Radiology interpretation: Dg Chest 2 View  Result Date: 05/21/2019 CLINICAL DATA:  85 year old male with history of lung cancer presenting with cough and congestion. EXAM: CHEST - 2 VIEW COMPARISON:  Chest radiograph dated 07/16/2018. FINDINGS: There is a patchy area of nodular interstitial and airspace density in the left upper lobe, new since the radiograph of 07/16/2018. This may represent progression of the known left upper lobe mass with spread of tumor to the adjacent lung parenchyma versus superimposed pneumonia. Correlation with clinical symptoms of infection recommended. CT may provide better evaluation. The right lung remains clear. No pleural effusion or pneumothorax. The cardiac silhouette is within normal limits.  Coronary vascular stent and calcification noted. Atherosclerotic calcification of the aorta. No acute osseous pathology. IMPRESSION: Findings concerning for progression of the known left upper lobe mass with spread of tumor to the adjacent lung parenchyma versus development of superimposed pneumonia. Clinical correlation is recommended. CT may provide better evaluation. Electronically Signed   By: Anner Crete M.D.   On: 05/21/2019 11:00        This case was discussed with Dr. Benay Spice. He expressed agreement with my management of this patient.

## 2019-05-24 NOTE — Progress Notes (Signed)
Chief Complaint  Patient presents with  . Follow-up    CAD    History of Present Illness: 85 yo male with history of atrial fibrillation, ischemic cardiomyopathy, CAD, HTN, PVCs and colon cancer who is here today for follow up. First cardiac stents placed in the Circumflex and LAD in 2007. The stents were Taxus drug eluting stents. LV function was normal at that time. I saw him in July 2018 and he had c/o leg weakness, dizziness. He had one fall due to sudden onset of weakness in his left leg. 48 hour cardiac monitor showed sinus bradycardia with PVCs, PACs and a short run of a wide complex tachycardia. Echo 01/25/17 with normal LV systolic function, no significant valve disease. He was admitted to [facility] 08/08/17 with a NSTEMI and was found to have severe disease in the LAD and Circumflex, both treated with drug eluting stents. (2 drug eluting stents placed in the Circumflex and one drug eluting stent placed in the LAD). He was also found to have atrial fibrillation and has been on Eliquis. Echo September 2019 with LVEF=60%, no significant valve disease. He had rectal bleeding and was found to have a mass in the sigmoid colon that turned out to be adenocarcinoma. He had a laparoscopic sigmoid colectomy in June 2019. He is being followed by Dr. Benay Spice in the Oncology office. Admitted to [facility] January 2020 after lung biopsy on left upper lobe nodule resulted in a pneumothorax requiring a chest tube placement. He has had chemotherapy and radiation and has completed his therapy.   He is here today for follow up. His daughter [name] is on speaker phone. The patient denies any chest pain, dyspnea, palpitations, orthopnea, PND, dizziness, near syncope or syncope. Recent dyspnea and found to have pneumonia vs enlarging lung tumor. On antibiotics per Oncology. Mostly concerned today about lower ext edema. Both legs and feet swelling for past few months. Minimal improvement at night. He is confused about his  medications. Still taking Lipitor but Lisinopril stopped in Oncology due to low BP. He is not on a diuretic.    Primary Care Physician: Marton Redwood, MD  Past Medical History:  Diagnosis Date  . A-fib ([place])   . Arthritis   . BPH (benign prostatic hyperplasia)   . Cancer [facility])    colon cancer   . Chest pain, unspecified   . Chronic kidney disease   . Coronary atherosclerosis of unspecified type of vessel, native or graft   . Dizziness and giddiness   . Hyperlipidemia   . Hypertension   . Lung cancer ([place])    bx's done 08/09/2017  . Myocardial infarction ([place]) 12 years ago   x2  . Non-ST elevation (NSTEMI) myocardial infarction ([place]) 08/2017   dx afib,replaced stent  . Peripheral neuropathy   . PVD (peripheral vascular disease) ([place])   . Shortness of breath    exertion  . Vitamin B12 deficiency     Past Surgical History:  Procedure Laterality Date  . COLON SURGERY     partial colectomy Dr. Marcello Moores 12-14-17  . CORONARY ANGIOPLASTY     4 stents  . CORONARY STENT INTERVENTION N/A 08/09/2017   Procedure: CORONARY STENT INTERVENTION;  Surgeon: Nelva Bush, MD;  Location: [place] CV LAB;  Service: Cardiovascular;  Laterality: N/A;  . FLEXIBLE SIGMOIDOSCOPY N/A 11/12/2017   Procedure: FLEXIBLE SIGMOIDOSCOPY;  Surgeon: Irene Shipper, MD;  Location: [facility] END[facility];  Service: Endoscopy;  Laterality: N/A;  . HERNIA REPAIR  right inguinal  . INGUINAL HERNIA REPAIR  06/14/2012   Procedure: HERNIA REPAIR INGUINAL ADULT;  Surgeon: Odis Hollingshead, MD;  Location: [place];  Service: General;  Laterality: Right;  . INSERTION OF MESH  06/14/2012   Procedure: INSERTION OF MESH;  Surgeon: Odis Hollingshead, MD;  Location: [place];  Service: General;  Laterality: Right;  . LAPAROSCOPIC PARTIAL COLECTOMY N/A 12/26/2017   Procedure: LAPAROSCOPIC PARTIAL COLECTOMY ERAS PATHWAY;  Surgeon: Leighton Ruff, MD;  Location: [facility] ORS;  Service: General;  Laterality: N/A;  . LEFT HEART CATH AND CORONARY  ANGIOGRAPHY N/A 08/09/2017   Procedure: LEFT HEART CATH AND CORONARY ANGIOGRAPHY;  Surgeon: Nelva Bush, MD;  Location: [place] CV LAB;  Service: Cardiovascular;  Laterality: N/A;  . PROCTOSCOPY N/A 12/26/2017   Procedure: PROCTOSCOPY;  Surgeon: Leighton Ruff, MD;  Location: [facility];  Service: General;  Laterality: N/A;  . SINUS SURGERY WITH INSTATRAK  1985    Current Outpatient Medications  Medication Sig Dispense Refill  . apixaban (ELIQUIS) 5 MG TABS tablet Take 1 tablet (5 mg total) by mouth 2 (two) times daily. 60 tablet 0  . aspirin EC 81 MG tablet Take 1 tablet (81 mg total) by mouth daily. 90 tablet 3  . atorvastatin (LIPITOR) 20 MG tablet Take 20 mg by mouth daily.    . ciprofloxacin (CIPRO) 500 MG tablet Take 1 tablet (500 mg total) by mouth 2 (two) times daily. 10 tablet 0  . Docusate Sodium (COLACE PO) Take 100 mg by mouth daily as needed.    . doxycycline (VIBRA-TABS) 100 MG tablet Take 1 tablet (100 mg total) by mouth 2 (two) times daily. 14 tablet 0  . finasteride (PROSCAR) 5 MG tablet Take 5 mg by mouth at bedtime.    Marland Kitchen glycerin adult 2 g suppository Place 1 suppository rectally as needed for constipation. 12 suppository 0  . HYDROcodone-acetaminophen (HYCET) 7.5-325 mg/15 ml solution Take 5 mLs by mouth every 6 (six) hours as needed for moderate pain. 240 mL 0  . metoprolol succinate (TOPROL-XL) 25 MG 24 hr tablet Take 3 tablets (75 mg total) by mouth daily. 90 tablet 9  . omeprazole (PRILOSEC) 40 MG capsule Take 1 capsule (40 mg total) by mouth daily. 30 capsule 5  . Polyethylene Glycol 3350 (MIRALAX PO) Take 17 g by mouth daily as needed.    . predniSONE (DELTASONE) 10 MG tablet 5 tab x 5 days, 4 tab x 5 days, 3 tab x 5 days, 2 tab x 5 days, 1 tab x 5 days, 1/2 tab x 4 days, begin on 11/12 with breakfast 77 tablet 0  . Probiotic Product (PROBIOTIC DAILY PO) Take 1 tablet by mouth daily.    . prochlorperazine (COMPAZINE) 5 MG tablet Take 1 tablet (5 mg total) by mouth  every 6 (six) hours as needed for nausea or vomiting. 30 tablet 0  . tamsulosin (FLOMAX) 0.4 MG CAPS capsule Take 0.4 mg by mouth at bedtime.    . vitamin B-12 (CYANOCOBALAMIN) 1000 MCG tablet Take 1,000 mcg by mouth daily.    . furosemide (LASIX) 20 MG tablet Take 1 tablet (20 mg total) by mouth daily. 90 tablet 3   No current facility-administered medications for this visit.     Allergies  Allergen Reactions  . Gadolinium Derivatives Hives, Itching and Other (See Comments)    Dr. Jeralyn Ruths s/w Mr. Quezada. We observed him for 15 minutes. He did not need to take Benadryl. The symptoms began to subside before  he left.     Social History   Socioeconomic History  . Marital status: Divorced    Spouse name: Not on file  . Number of children: 3  . Years of education: Not on file  . Highest education level: Not on file  Occupational History  . Occupation: Magazine features editor    Comment: [employer]  Social Needs  . Financial resource strain: Not on file  . Food insecurity    Worry: Not on file    Inability: Not on file  . Transportation needs    Medical: No    Non-medical: No  Tobacco Use  . Smoking status: Former Smoker    Quit date: 07/10/1981    Years since quitting: 37.9  . Smokeless tobacco: Never Used  Substance and Sexual Activity  . Alcohol use: No  . Drug use: No  . Sexual activity: Not on file  Lifestyle  . Physical activity    Days per week: Not on file    Minutes per session: Not on file  . Stress: Not on file  Relationships  . Social Herbalist on phone: Not on file    Gets together: Not on file    Attends religious service: Not on file    Active member of club or organization: Not on file    Attends meetings of clubs or organizations: Not on file    Relationship status: Not on file  . Intimate partner violence    Fear of current or ex partner: Not on file    Emotionally abused: Not on file    Physically abused: Not on file    Forced  sexual activity: Not on file  Other Topics Concern  . Not on file  Social History Narrative   Regular exercise          Family History  Problem Relation Age of Onset  . Heart attack Father 11  . Arthritis Sister   . Hypothyroidism Daughter   . Colon cancer Neg Hx   . Esophageal cancer Neg Hx   . Rectal cancer Neg Hx   . Stomach cancer Neg Hx     Review of Systems:  As stated in the HPI and otherwise negative.   BP 126/60   Pulse 69   Ht 5\' 11"  (1.803 m)   Wt 158 lb (71.7 kg)   SpO2 97%   BMI 22.04 kg/m   Physical Examination:  General: Well developed, well nourished, NAD  HEENT: OP clear, mucus membranes moist  SKIN: warm, dry. No rashes. Neuro: No focal deficits  Musculoskeletal: Muscle strength 5/5 all ext  Psychiatric: Mood and affect normal  Neck: No JVD, no carotid bruits, no thyromegaly, no lymphadenopathy.  Lungs:Clear bilaterally, no wheezes, rhonci, crackles Cardiovascular: Regular rate and rhythm. No murmurs, gallops or rubs. Abdomen:Soft. Bowel sounds present. Non-tender.  Extremities: No lower extremity edema. Pulses are 2 + in the bilateral DP/PT.  Echo September 2019: - Left ventricle: The cavity size was normal. Wall thickness was   normal. Systolic function was normal. The estimated ejection   fraction was in the range of 60% to 65%. Wall motion was normal;   there were no regional wall motion abnormalities. Features are   consistent with a pseudonormal left ventricular filling pattern,   with concomitant abnormal relaxation and increased filling   pressure (grade 2 diastolic dysfunction). - Aortic valve: There was trivial regurgitation.  Cardiac cath 08/09/17: 1. Severe 2-vessel coronary artery disease, including 80%  in-stent restenosis of the mid LAD with disease extending beyond the distal stent edge, as well as 70% ostial/proximal LCx disease followed by multifocal mid and distal LCx in-stent restenosis of up to 99%. 2. Moderate,  non-obstructive RCA disease. 3. Normal left ventricular filling pressure. 4. Successful PCI to mid LAD with placement of Resolute Onyx 2.75 x 34 mm drug eluting stent (post-dilated to 3.1 mm) with 0% residual stenosis and TIMI-3 flow. 5. Successful PCI to LCx with placement of Resolute Onyx drug-eluting stents extending from the ostium to the proximal segment of old stent (2.5 x 18 mm) and covering the distal 1/3 of old stent extending into the distal LCx (2.0 x 30 mm) with 0% residual stenosis and TIMI-3 flow.  EKG:  EKG is ordered today. The ekg ordered today demonstrates NSR, rate 69 bpm. PVCs. Non-specific ST and T wave abn.   Recent Labs: 07/14/2018: Magnesium 1.8 05/21/2019: ALT 15; BUN 24; Creatinine 0.96; Hemoglobin 9.1; Platelet Count 244; Potassium 4.3; Sodium 136   Lipid Panel    Component Value Date/Time   CHOL 160 08/09/2017 0359   TRIG 74 08/09/2017 0359   HDL 41 08/09/2017 0359   CHOLHDL 3.9 08/09/2017 0359   VLDL 15 08/09/2017 0359   LDLCALC 104 (H) 08/09/2017 0359     Wt Readings from Last 3 Encounters:  05/26/19 158 lb (71.7 kg)  05/05/19 155 lb 1.6 oz (70.4 kg)  04/14/19 151 lb 6.4 oz (68.7 kg)     Other studies Reviewed: Additional studies/ records that were reviewed today include: . Review of the above records demonstrates:    Assessment and Plan:   1. CAD without angina: No chest pain. Continue ASA, statin and beta blocker.    2. Atrial fibrillation, paroxysmal: Sinus today. Continue beta blocker and Eliquis.   3. HTN: BP controlled. NO changes  4. Ischemic cardiomyopathy: LVEF=60% by echo September 2019. Will continue beta blocker. Ace-inh stopped by Dr. Benay Spice in September 2020 due to hypotension.    5. Colon cancer/Lung cancer: He is s/p sigmoid colectomy. He has just completed chemotherapy and radiation.   6. Acute on chronic diastolic CHF:  Will start Lasix 20 mg daily. Follow up by [employer] in one week and office follow up 6 weeks.    Current medicines are reviewed at length with the patient today.  The patient does not have concerns regarding medicines.  The following changes have been made:  no change  Labs/ tests ordered today include:   Orders Placed This Encounter  Procedures  . EKG 12-Lead    Disposition:   FU with me in 6  weeks Signed, Lauree Chandler, MD 05/26/2019 4:57 PM    [facility] [address], [place], [place]  [address] Phone: [phone]; Fax: [phone]

## 2019-05-26 ENCOUNTER — Ambulatory Visit (INDEPENDENT_AMBULATORY_CARE_PROVIDER_SITE_OTHER): Payer: Medicare Other | Admitting: Cardiovascular Disease

## 2019-05-26 ENCOUNTER — Encounter: Payer: Self-pay | Admitting: Cardiovascular Disease

## 2019-05-26 ENCOUNTER — Other Ambulatory Visit: Payer: Self-pay

## 2019-05-26 VITALS — BP 126/60 | HR 69 | Ht 71.0 in | Wt 158.0 lb

## 2019-05-26 DIAGNOSIS — I5033 Acute on chronic diastolic (congestive) heart failure: Secondary | ICD-10-CM | POA: Diagnosis not present

## 2019-05-26 DIAGNOSIS — I480 Paroxysmal atrial fibrillation: Secondary | ICD-10-CM | POA: Diagnosis not present

## 2019-05-26 DIAGNOSIS — I2510 Atherosclerotic heart disease of native coronary artery without angina pectoris: Secondary | ICD-10-CM | POA: Diagnosis not present

## 2019-05-26 DIAGNOSIS — I255 Ischemic cardiomyopathy: Secondary | ICD-10-CM | POA: Diagnosis not present

## 2019-05-26 MED ORDER — FUROSEMIDE 20 MG PO TABS: 20.0000 mg | ORAL_TABLET | Freq: Every day | ORAL | 3 refills | Status: DC

## 2019-05-26 NOTE — Patient Instructions (Addendum)
Medication Instructions:  1) START LASIX 20 mg daily   Follow-Up: You have an appointment with Dr. Angelena Form on 07/10/2019 at 2:00PM.

## 2019-05-28 ENCOUNTER — Telehealth: Payer: Self-pay | Admitting: Radiation Oncology

## 2019-05-28 DIAGNOSIS — N401 Enlarged prostate with lower urinary tract symptoms: Secondary | ICD-10-CM | POA: Diagnosis not present

## 2019-05-28 DIAGNOSIS — R3915 Urgency of urination: Secondary | ICD-10-CM | POA: Diagnosis not present

## 2019-05-28 NOTE — Progress Notes (Signed)
  Radiation Oncology         [phone] ________________________________  Name: [name] MRN: [id number]  Date: 04/07/2019  DOB: [birth year]  End of Treatment Note  Diagnosis:  Lung cancer     Indication for treatment::  curative       Radiation treatment dates:   02/24/19 - 04/07/19  Site/dose:   The patient was treated to the disease within the mediastinum to a dose of 60 Gy using a 4 field, 3-D conformal technique.    Narrative: The patient tolerated radiation treatment.   The patient did experience esophagitis during the course of treatment which required management.   Plan: The patient has completed radiation treatment. The patient will return to radiation oncology clinic for routine followup in one month. I advised the patient to call or return sooner if they have any questions or concerns related to their recovery or treatment. ________________________________  Jodelle Gross, M.D., Ph.D.

## 2019-05-28 NOTE — Telephone Encounter (Signed)
I called to check on the patient to see how he was doing. About a week ago he was seen in the supportive care clinic and was diagnosed with probable radiation pneumonitis. He was started on antibiotics and 5 1/2 week steroid taper beginning with 50 mg prednisone daily for 5 days. He is still having some congestion and reports no fevers. He describes persistent fatigue and some difficulty with walking and balance. He denies any headaches or visual changes and his blood pressure as acceptable this week with Dr. Camillia Herter office. He had a normal brain MRI in August 2020 as well. He will see Dr. Benay Spice next Monday as well but will call sooner if he has any concerns or progressive symptoms.

## 2019-06-02 ENCOUNTER — Other Ambulatory Visit: Payer: Self-pay

## 2019-06-02 ENCOUNTER — Inpatient Hospital Stay (HOSPITAL_BASED_OUTPATIENT_CLINIC_OR_DEPARTMENT_OTHER): Payer: Medicare Other | Admitting: Oncology

## 2019-06-02 VITALS — BP 129/60 | HR 55 | Temp 97.9°F | Resp 18 | Ht 71.0 in | Wt 160.3 lb

## 2019-06-02 DIAGNOSIS — C3412 Malignant neoplasm of upper lobe, left bronchus or lung: Secondary | ICD-10-CM | POA: Diagnosis not present

## 2019-06-02 DIAGNOSIS — I255 Ischemic cardiomyopathy: Secondary | ICD-10-CM

## 2019-06-02 NOTE — Progress Notes (Signed)
[name] OFFICE PROGRESS NOTE   Diagnosis: Non-small cell lung cancer  INTERVAL HISTORY:   [name] returns for scheduled visit.  He saw Sandi Mealy on 05/21/2019 with exertional dyspnea and a nonproductive cough.  A chest x-ray revealed new airspace opacity in the left upper lobe.  He was treated with doxycycline and a prednisone taper.  Radiation oncology was consulted.  The prednisone was given for possible radiation pneumonitis. He reports partial improvement in the cough.  No fever or dyspnea.  No dysphagia or odynophagia.  He complains of generalized weakness and balance difficulty.  He is eating and getting out of the house.  Objective:  Vital signs in last 24 hours:  Blood pressure 129/60, pulse (!) 55, temperature 97.9 F (36.6 C), temperature source Temporal, resp. rate 18, height '5\' 11"'  (1.803 m), weight 160 lb 4.8 oz (72.7 kg), SpO2 100 %.    HEENT: Mild thrush over the tongue and bilateral buccal mucosa Lymphatics: No cervical or supraclavicular nodes Resp: Inspiratory rales at the left lower posterior chest, no respiratory distress Cardio: Regular rate and rhythm GI: No hepatomegaly, nontender Vascular: 1+ pitting edema below the knee bilaterally     Lab Results:  Lab Results  Component Value Date   WBC 5.4 05/21/2019   HGB 9.1 (L) 05/21/2019   HCT 28.8 (L) 05/21/2019   MCV 92.9 05/21/2019   PLT 244 05/21/2019   NEUTROABS 3.9 05/21/2019    CMP  Lab Results  Component Value Date   NA 136 05/21/2019   K 4.3 05/21/2019   CL 102 05/21/2019   CO2 26 05/21/2019   GLUCOSE 102 (H) 05/21/2019   BUN 24 (H) 05/21/2019   CREATININE 0.96 05/21/2019   CALCIUM 8.5 (L) 05/21/2019   PROT 6.2 (L) 05/21/2019   ALBUMIN 2.5 (L) 05/21/2019   AST 16 05/21/2019   ALT 15 05/21/2019   ALKPHOS 85 05/21/2019   BILITOT 0.8 05/21/2019   GFRNONAA >60 05/21/2019   GFRAA >60 05/21/2019    Lab Results  Component Value Date   CEA1 1.64 11/12/2018      Medications: I have reviewed the patient's current medications.   Assessment/Plan: 1. Sigmoid colon cancer, stage II (T3N0), status post a sigmoidectomy 12/26/2017 ? Tumor involving the sigmoid colon and proximal rectum ? MSI-stable, no loss of mismatch repair protein expression ? Staging CTs of the chest, abdomen, and pelvis on 11/20/2017-focal wall thickening at the distal sigmoid colon, no evidence of metastatic disease, indeterminate 1.9 cm sub-solid left upper lobe nodule 2. Coronary artery disease 3. Atrial fibrillation 4. BPH 5. Left arm/hand intention tremor 6. Report of balance difficulty 7. COPD 8. Indeterminate left upper lobe nodule on the staging chest CT 11/20/2017  Increase in part solid nodule in the left upper lobe, stable groundglass nodule in the superior segment of the left lower lobe  CT biopsy- adenocarcinoma consistent with lung primary, MSS, tumor mutation burden 13, BRAF [id number], PDL-1 TPS 5%. No EGFR, ALK, ERBB2, ROS alteratation  SBRT 08/14/2018-08/23/2018, 5 fractions  CT chest 10/23/2018 -2.6 x 1.7 cm left upper lobe nodule-enlarged, 13 mm left suprahilar node-possibly new, 9 mm AP window node previously 6 mm  CT chest 01/23/2019-new mediastinal and progressive hilar adenopathy,  PET scan 02/03/2019-hypermetabolic mediastinal and left hilar lymph nodes, hypermetabolic left upper lobe mass, no evidence of distant metastatic disease  MRI brain 02/12/2019-no metastatic disease  Radiation beginning 02/24/2019  Cycle 1 weekly Taxol/carboplatin 02/24/2019  Cycle 2 weekly Taxol/carboplatin 03/04/2019  Cycle 3  weekly Taxol/carboplatin 03/18/2019 (Taxol and carboplatin dose reduced secondary to neutropenia following cycle 2)  Cycle 4 weekly Taxol/carboplatin 03/31/2019  Radiation completed 04/07/2019 9. Renal insufficiency 10.Odynophagia secondary to radiation esophagitis 11.  Cough/dyspnea 05/21/2019-treated with doxycycline, and prednisone for radiation pneumonitis       Disposition: [name] is completing a prednisone taper for treatment of possible radiation pneumonitis.  His overall clinical status appears to be improving.  He has gained weight.  He will continue follow-up with cardiology for management of the lower extremity edema.  [name] will continue to increase his activity level as tolerated.  He will be careful ambulating with his balance difficulty.  He will return for an office visit and restaging chest CT in approximately 1 month.  25 minutes were spent with the patient today.  The majority of the time was used for counseling and coronation of care.  Betsy Coder, MD  06/02/2019  8:44 AM

## 2019-06-24 ENCOUNTER — Other Ambulatory Visit: Payer: Self-pay | Admitting: Nurse Practitioner

## 2019-06-24 DIAGNOSIS — C3412 Malignant neoplasm of upper lobe, left bronchus or lung: Secondary | ICD-10-CM

## 2019-06-25 ENCOUNTER — Inpatient Hospital Stay: Payer: Medicare Other | Attending: Oncology

## 2019-06-25 ENCOUNTER — Telehealth: Payer: Self-pay | Admitting: Oncology

## 2019-06-25 ENCOUNTER — Other Ambulatory Visit: Payer: Self-pay

## 2019-06-25 DIAGNOSIS — I4891 Unspecified atrial fibrillation: Secondary | ICD-10-CM | POA: Insufficient documentation

## 2019-06-25 DIAGNOSIS — Z85038 Personal history of other malignant neoplasm of large intestine: Secondary | ICD-10-CM | POA: Diagnosis not present

## 2019-06-25 DIAGNOSIS — Z85118 Personal history of other malignant neoplasm of bronchus and lung: Secondary | ICD-10-CM | POA: Diagnosis present

## 2019-06-25 DIAGNOSIS — N400 Enlarged prostate without lower urinary tract symptoms: Secondary | ICD-10-CM | POA: Insufficient documentation

## 2019-06-25 DIAGNOSIS — J449 Chronic obstructive pulmonary disease, unspecified: Secondary | ICD-10-CM | POA: Diagnosis not present

## 2019-06-25 DIAGNOSIS — I2510 Atherosclerotic heart disease of native coronary artery without angina pectoris: Secondary | ICD-10-CM | POA: Insufficient documentation

## 2019-06-25 DIAGNOSIS — C3412 Malignant neoplasm of upper lobe, left bronchus or lung: Secondary | ICD-10-CM

## 2019-06-25 DIAGNOSIS — R609 Edema, unspecified: Secondary | ICD-10-CM | POA: Insufficient documentation

## 2019-06-25 LAB — BASIC METABOLIC PANEL - CANCER CENTER ONLY
Anion gap: 9 (ref 5–15)
BUN: 21 mg/dL (ref 8–23)
CO2: 28 mmol/L (ref 22–32)
Calcium: 8.5 mg/dL — ABNORMAL LOW (ref 8.9–10.3)
Chloride: 101 mmol/L (ref 98–111)
Creatinine: 1.07 mg/dL (ref 0.61–1.24)
GFR, Est AFR Am: >60 mL/min (ref >60)
GFR, Estimated: >60 mL/min (ref >60)
Glucose, Bld: 111 mg/dL — ABNORMAL HIGH (ref 70–99)
Potassium: 3.8 mmol/L (ref 3.5–5.1)
Sodium: 138 mmol/L (ref 135–145)

## 2019-06-25 NOTE — Telephone Encounter (Signed)
Scheduled appt per 12/14 and 12/15 sch message - pt aware of new appt dates and time

## 2019-06-26 ENCOUNTER — Ambulatory Visit (HOSPITAL_COMMUNITY)
Admission: RE | Admit: 2019-06-26 | Discharge: 2019-06-26 | Disposition: A | Discharge status: Home / Self Care | Payer: Medicare Other | Source: Ambulatory Visit | Attending: Nurse Practitioner | Admitting: Nurse Practitioner

## 2019-06-26 ENCOUNTER — Encounter (HOSPITAL_COMMUNITY): Payer: Self-pay

## 2019-06-26 DIAGNOSIS — C3412 Malignant neoplasm of upper lobe, left bronchus or lung: Secondary | ICD-10-CM | POA: Diagnosis present

## 2019-06-26 MED ORDER — SODIUM CHLORIDE (PF) 0.9 % IJ SOLN
INTRAMUSCULAR | Status: AC
  Filled 2019-06-26: qty 50

## 2019-06-26 MED ORDER — IOHEXOL 300 MG/ML  SOLN
100.0000 mL | Freq: Once | INTRAMUSCULAR | Status: AC | PRN
  Administered 2019-06-26: 08:00:00 100 mL via INTRAVENOUS

## 2019-06-27 ENCOUNTER — Telehealth: Payer: Self-pay | Admitting: *Deleted

## 2019-06-27 ENCOUNTER — Telehealth: Payer: Self-pay | Admitting: Nurse Practitioner

## 2019-06-27 ENCOUNTER — Other Ambulatory Visit: Payer: Self-pay

## 2019-06-27 ENCOUNTER — Inpatient Hospital Stay (HOSPITAL_BASED_OUTPATIENT_CLINIC_OR_DEPARTMENT_OTHER): Payer: Medicare Other | Admitting: Oncology

## 2019-06-27 VITALS — BP 136/73 | HR 85 | Temp 97.9°F | Resp 28 | Wt 158.0 lb

## 2019-06-27 DIAGNOSIS — C3412 Malignant neoplasm of upper lobe, left bronchus or lung: Secondary | ICD-10-CM | POA: Diagnosis not present

## 2019-06-27 DIAGNOSIS — I255 Ischemic cardiomyopathy: Secondary | ICD-10-CM

## 2019-06-27 DIAGNOSIS — Z85118 Personal history of other malignant neoplasm of bronchus and lung: Secondary | ICD-10-CM | POA: Diagnosis not present

## 2019-06-27 NOTE — Telephone Encounter (Signed)
Scheduled appt per 12/18 los.  Printed calendar and avs.

## 2019-06-27 NOTE — Telephone Encounter (Signed)
Called and spoke with [name]. Scheduled him with Cecilie Kicks on 07/02/19. He has upcoming with Dr. Angelena Form on 12/31 but feels that may be too long to wait. He saw Dr. Brigitte Pulse yesterday and Dr. Benay Spice today.  His legs are worse today than they were 2 days ago with redness. Pt appreciative for the call/appointment.

## 2019-06-27 NOTE — Telephone Encounter (Signed)
error 

## 2019-06-27 NOTE — Progress Notes (Signed)
[facility] OFFICE PROGRESS NOTE    Diagnosis: Non-small cell lung cancer   INTERVAL HISTORY:   [name] returns prior to a scheduled visit.  He continues to have an intermittent cough.  No fever.  The cough is nonproductive.  The neck pain and odynophagia have resolved.  He reports a good appetite.  He complains of increased leg swelling and bilateral leg/foot erythema.  This has been worse for the past several days.  He complains of pain at the lower back and lower abdomen when he coughs.  He had bilateral low anterior lateral chest pain over the past few weeks. He feels the cough may be related to house dust.  He did not experience a cough when he was outside for several hours. Objective:  Vital signs in last 24 hours:  Blood pressure 136/73, pulse 85, temperature 97.9 F (36.6 C), temperature source Temporal, resp. rate (!) 28, weight 158 lb (71.7 kg), SpO2 99 %.    HEENT: Neck without mass Lymphatics: No cervical or supraclavicular nodes Resp: Lungs clear bilaterally, no respiratory distress Cardio: Regular rate and rhythm GI: No mass, nontender, no hepatosplenomegaly Vascular: 2+ pitting edema below the knee bilaterally with confluent erythema at the lower legs and feet      Lab Results:  Lab Results  Component Value Date   WBC 5.4 05/21/2019   HGB 9.1 (L) 05/21/2019   HCT 28.8 (L) 05/21/2019   MCV 92.9 05/21/2019   PLT 244 05/21/2019   NEUTROABS 3.9 05/21/2019    CMP  Lab Results  Component Value Date   NA 138 06/25/2019   K 3.8 06/25/2019   CL 101 06/25/2019   CO2 28 06/25/2019   GLUCOSE 111 (H) 06/25/2019   BUN 21 06/25/2019   CREATININE 1.07 06/25/2019   CALCIUM 8.5 (L) 06/25/2019   PROT 6.2 (L) 05/21/2019   ALBUMIN 2.5 (L) 05/21/2019   AST 16 05/21/2019   ALT 15 05/21/2019   ALKPHOS 85 05/21/2019   BILITOT 0.8 05/21/2019   GFRNONAA >60 06/25/2019   GFRAA >60 06/25/2019    Lab Results  Component Value Date   CEA1 1.64  11/12/2018     Imaging:  CT Chest W Contrast  Result Date: 06/26/2019 CLINICAL DATA:  Lung cancer, colon cancer, back pain EXAM: CT CHEST, ABDOMEN, AND PELVIS WITH CONTRAST TECHNIQUE: Multidetector CT imaging of the chest, abdomen and pelvis was performed following the standard protocol during bolus administration of intravenous contrast. CONTRAST:  164m OMNIPAQUE IOHEXOL 300 MG/ML SOLN, additional oral enteric contrast COMPARISON:  CT abdomen pelvis, 04/01/2019, PET-CT, 02/03/2019, CT chest, 01/23/2019, CT abdomen pelvis, 11/14/2016 FINDINGS: CT CHEST FINDINGS Cardiovascular: No significant vascular findings. Normal heart size. No pericardial effusion. Mediastinum/Nodes: Interval decrease in size of enlarged left hilar, AP window, and superior mediastinal lymph nodes, an index AP window lymph node measuring 1.8 x 1.4 cm, previously 2.2 x 1.6 cm (series 2, image 22). Thyroid gland, trachea, and esophagus demonstrate no significant findings. Lungs/Pleura: Emphysema. New small left pleural effusion. There is extensive new and increased dense consolidative opacity of the perihilar left lung and left upper lobe. Musculoskeletal: No chest wall mass or suspicious bone lesions identified. CT ABDOMEN PELVIS FINDINGS Hepatobiliary: No solid liver abnormality is seen. Hepatic steatosis. No gallstones, gallbladder wall thickening, or biliary dilatation. Pancreas: Unremarkable. No pancreatic ductal dilatation or surrounding inflammatory changes. Spleen: Normal in size without significant abnormality. Adrenals/Urinary Tract: Adrenal glands are unremarkable. Unchanged high attenuation partially exophytic mass of the midportion of  the left kidney, measuring 1.0 cm (series 4, image 107). Bladder is unremarkable. Stomach/Bowel: Stomach is within normal limits. Appendix appears normal. Redemonstrated postoperative findings of sigmoid colon resection and anastomosis. Vascular/Lymphatic: Aortic atherosclerosis. No enlarged  abdominal or pelvic lymph nodes. Reproductive: Prostatomegaly. Other: No abdominal wall hernia or abnormality. No abdominopelvic ascites. Musculoskeletal: There is a new, subtle sclerotic inferior endplate deformity of the L1 vertebral body (series 5, image 109). Chronic bilateral pars defects of L5 with minimal anterolisthesis of L5 on S1. IMPRESSION: 1. Extensive new and increased dense consolidative opacity of the perihilar left lung and left upper lobe, consistent with developing post treatment and radiation change. 2. Interval decrease in size of enlarged left hilar and superior mediastinal lymph nodes, consistent with treatment response. 3. New small left pleural effusion. 4. Unchanged postoperative findings of sigmoid colon resection. 5. No evidence of metastatic disease in the abdomen or pelvis. 6. New, subtle sclerotic inferior endplate deformity of the L1 vertebral body. No specific CT appearance to suggest osseous metastatic disease although vertebral body fractures in the setting of known malignancy are inherently suspicious. Attention on follow-up. 7. Unchanged high attenuation partially exophytic mass of the midportion of the left kidney, measuring 1.0 cm (series 4, image 107). This remains suspicious for an indolent renal cell carcinoma and has not significantly changed in appearance on examinations dating back to 2018. 8. Hepatic steatosis. 9. Prostatomegaly. 10. Coronary artery disease. Aortic Atherosclerosis (ICD10-I70.0) and Emphysema (ICD10-J43.9). Electronically Signed   By: Areeb Candle M.D.   On: 06/26/2019 09:25   CT Abdomen Pelvis W Contrast  Result Date: 06/26/2019 CLINICAL DATA:  Lung cancer, colon cancer, back pain EXAM: CT CHEST, ABDOMEN, AND PELVIS WITH CONTRAST TECHNIQUE: Multidetector CT imaging of the chest, abdomen and pelvis was performed following the standard protocol during bolus administration of intravenous contrast. CONTRAST:  177m OMNIPAQUE IOHEXOL 300 MG/ML SOLN,  additional oral enteric contrast COMPARISON:  CT abdomen pelvis, 04/01/2019, PET-CT, 02/03/2019, CT chest, 01/23/2019, CT abdomen pelvis, 11/14/2016 FINDINGS: CT CHEST FINDINGS Cardiovascular: No significant vascular findings. Normal heart size. No pericardial effusion. Mediastinum/Nodes: Interval decrease in size of enlarged left hilar, AP window, and superior mediastinal lymph nodes, an index AP window lymph node measuring 1.8 x 1.4 cm, previously 2.2 x 1.6 cm (series 2, image 22). Thyroid gland, trachea, and esophagus demonstrate no significant findings. Lungs/Pleura: Emphysema. New small left pleural effusion. There is extensive new and increased dense consolidative opacity of the perihilar left lung and left upper lobe. Musculoskeletal: No chest wall mass or suspicious bone lesions identified. CT ABDOMEN PELVIS FINDINGS Hepatobiliary: No solid liver abnormality is seen. Hepatic steatosis. No gallstones, gallbladder wall thickening, or biliary dilatation. Pancreas: Unremarkable. No pancreatic ductal dilatation or surrounding inflammatory changes. Spleen: Normal in size without significant abnormality. Adrenals/Urinary Tract: Adrenal glands are unremarkable. Unchanged high attenuation partially exophytic mass of the midportion of the left kidney, measuring 1.0 cm (series 4, image 107). Bladder is unremarkable. Stomach/Bowel: Stomach is within normal limits. Appendix appears normal. Redemonstrated postoperative findings of sigmoid colon resection and anastomosis. Vascular/Lymphatic: Aortic atherosclerosis. No enlarged abdominal or pelvic lymph nodes. Reproductive: Prostatomegaly. Other: No abdominal wall hernia or abnormality. No abdominopelvic ascites. Musculoskeletal: There is a new, subtle sclerotic inferior endplate deformity of the L1 vertebral body (series 5, image 109). Chronic bilateral pars defects of L5 with minimal anterolisthesis of L5 on S1. IMPRESSION: 1. Extensive new and increased dense  consolidative opacity of the perihilar left lung and left upper lobe, consistent with developing post treatment  and radiation change. 2. Interval decrease in size of enlarged left hilar and superior mediastinal lymph nodes, consistent with treatment response. 3. New small left pleural effusion. 4. Unchanged postoperative findings of sigmoid colon resection. 5. No evidence of metastatic disease in the abdomen or pelvis. 6. New, subtle sclerotic inferior endplate deformity of the L1 vertebral body. No specific CT appearance to suggest osseous metastatic disease although vertebral body fractures in the setting of known malignancy are inherently suspicious. Attention on follow-up. 7. Unchanged high attenuation partially exophytic mass of the midportion of the left kidney, measuring 1.0 cm (series 4, image 107). This remains suspicious for an indolent renal cell carcinoma and has not significantly changed in appearance on examinations dating back to 2018. 8. Hepatic steatosis. 9. Prostatomegaly. 10. Coronary artery disease. Aortic Atherosclerosis (ICD10-I70.0) and Emphysema (ICD10-J43.9). Electronically Signed   By: Siddh Candle M.D.   On: 06/26/2019 09:25    Medications: I have reviewed the patient's current medications.   Assessment/Plan: 1. Sigmoid colon cancer, stage II (T3N0), status post a sigmoidectomy 12/26/2017 ? Tumor involving the sigmoid colon and proximal rectum ? MSI-stable, no loss of mismatch repair protein expression ? Staging CTs of the chest, abdomen, and pelvis on 11/20/2017-focal wall thickening at the distal sigmoid colon, no evidence of metastatic disease, indeterminate 1.9 cm sub-solid left upper lobe nodule 2. Coronary artery disease 3. Atrial fibrillation 4. BPH 5. Left arm/hand intention tremor 6. Report of balance difficulty 7. COPD 8. Indeterminate left upper lobe nodule on the staging chest CT 11/20/2017  Increase in part solid nodule in the left upper lobe, stable  groundglass nodule in the superior segment of the left lower lobe  CT biopsy- adenocarcinoma consistent with lung primary, MSS, tumor mutation burden 13, BRAF [id number], PDL-1 TPS 5%. No EGFR, ALK, ERBB2, ROS alteratation  SBRT 08/14/2018-08/23/2018, 5 fractions  CT chest 10/23/2018 -2.6 x 1.7 cm left upper lobe nodule-enlarged, 13 mm left suprahilar node-possibly new, 9 mm AP window node previously 6 mm  CT chest 01/23/2019-new mediastinal and progressive hilar adenopathy,  PET scan 02/03/2019-hypermetabolic mediastinal and left hilar lymph nodes, hypermetabolic left upper lobe mass, no evidence of distant metastatic disease  MRI brain 02/12/2019-no metastatic disease  Radiation beginning 02/24/2019  Cycle 1 weekly Taxol/carboplatin 02/24/2019  Cycle 2 weekly Taxol/carboplatin 03/04/2019  Cycle 3 weekly Taxol/carboplatin 03/18/2019 (Taxol and carboplatin dose reduced secondary to neutropenia following cycle 2)  Cycle 4 weekly Taxol/carboplatin 03/31/2019  Radiation completed 04/07/2019  CT 06/26/2019-new with increased consolidative change in the left upper lobe and left perihilar region, increase in size of hilar and superior mediastinal nodes, new small left pleural effusion, no evidence of metastatic disease in the abdomen or pelvis, subtle inferior endplate deformity at L1 9. History of renal insufficiency 10.Odynophagia secondary to radiation esophagitis-resolved 11.  Cough/dyspnea 05/21/2019-treated with doxycycline, and prednisone for radiation pneumonitis  Disposition: [name] completed chemotherapy and radiation for treatment of locally advanced non-small cell lung cancer.  He is now greater than 2 months out from completing treatment.  Odynophagia has resolved.  His appetite has improved.  [name] complains of a persistent cough.  I suspect the cough is related to radiation change in the left chest and underlying emphysema.  I have a low suspicion for pneumonia.  He reports  having a negative Covid test this week at Dr. Raul Del office.  The back pain is likely related to a benign musculoskeletal condition.  The pain is lower than the subtle endplate fracture noted on CT.  The pain is intermittent and does not appear to be severe.  He has significant edema in the lower leg bilaterally.  The erythema is likely due to chronic stasis.  I have a low suspicion for cellulitis.  He will contact Dr. Brigitte Pulse and Dr. Angelena Form to recommend treatment of the edema.  He will elevate the legs and try support stockings.  [name] will return for an office visit and chest x-ray in 1 month.  He will contact us in the interim for new symptoms.  40 minutes were spent with the patient today.  The majority of the time was used for counseling and coordination of care.  Betsy Coder, MD  06/27/2019  4:08 PM

## 2019-06-30 ENCOUNTER — Telehealth: Payer: Self-pay | Admitting: Surgery

## 2019-06-30 NOTE — Telephone Encounter (Signed)
Patient's son Inocente Salles called advised patient do not want to get the cortisone injection just the X-ray of his foot. Sam asked if patient can just come in for the X-ray and leave. The number to contact Sam is [phone]

## 2019-06-30 NOTE — Telephone Encounter (Signed)
Please advise. I do not see anything in patient's chart.

## 2019-07-01 NOTE — Telephone Encounter (Signed)
I have no idea what this message is about.  Can call and advise I do not see where patient has been seen in this office.

## 2019-07-02 ENCOUNTER — Ambulatory Visit (HOSPITAL_COMMUNITY)
Admission: RE | Admit: 2019-07-02 | Discharge: 2019-07-02 | Disposition: A | Discharge status: Home / Self Care | Payer: Medicare Other | Source: Ambulatory Visit | Attending: Surgery | Admitting: Surgery

## 2019-07-02 ENCOUNTER — Ambulatory Visit (INDEPENDENT_AMBULATORY_CARE_PROVIDER_SITE_OTHER): Payer: Medicare Other | Admitting: Cardiology

## 2019-07-02 ENCOUNTER — Other Ambulatory Visit: Payer: Self-pay

## 2019-07-02 ENCOUNTER — Ambulatory Visit: Payer: Self-pay

## 2019-07-02 ENCOUNTER — Encounter: Payer: Self-pay | Admitting: Surgery

## 2019-07-02 ENCOUNTER — Ambulatory Visit (INDEPENDENT_AMBULATORY_CARE_PROVIDER_SITE_OTHER): Payer: Medicare Other | Admitting: Surgery

## 2019-07-02 ENCOUNTER — Encounter: Payer: Self-pay | Admitting: Cardiology

## 2019-07-02 VITALS — BP 110/62 | HR 76 | Ht 71.0 in | Wt 154.0 lb

## 2019-07-02 VITALS — BP 105/72 | HR 139 | Ht 71.0 in | Wt 158.0 lb

## 2019-07-02 DIAGNOSIS — I255 Ischemic cardiomyopathy: Secondary | ICD-10-CM

## 2019-07-02 DIAGNOSIS — R609 Edema, unspecified: Secondary | ICD-10-CM | POA: Diagnosis not present

## 2019-07-02 DIAGNOSIS — Z79899 Other long term (current) drug therapy: Secondary | ICD-10-CM

## 2019-07-02 DIAGNOSIS — R600 Localized edema: Secondary | ICD-10-CM | POA: Insufficient documentation

## 2019-07-02 DIAGNOSIS — I2510 Atherosclerotic heart disease of native coronary artery without angina pectoris: Secondary | ICD-10-CM

## 2019-07-02 DIAGNOSIS — R0602 Shortness of breath: Secondary | ICD-10-CM

## 2019-07-02 DIAGNOSIS — M79661 Pain in right lower leg: Secondary | ICD-10-CM

## 2019-07-02 DIAGNOSIS — M79662 Pain in left lower leg: Secondary | ICD-10-CM

## 2019-07-02 DIAGNOSIS — M79672 Pain in left foot: Secondary | ICD-10-CM | POA: Diagnosis not present

## 2019-07-02 DIAGNOSIS — I480 Paroxysmal atrial fibrillation: Secondary | ICD-10-CM

## 2019-07-02 MED ORDER — FUROSEMIDE 20 MG PO TABS: 20.0000 mg | ORAL_TABLET | Freq: Every day | ORAL | 3 refills | Status: DC

## 2019-07-02 NOTE — Telephone Encounter (Signed)
Pt came in for an appt with Jeneen Rinks today.

## 2019-07-02 NOTE — Progress Notes (Signed)
Cardiology Office Note   Date:  07/03/2019   ID:  [name], DOB Mar 28, 1930, MRN [id number]  PCP:  David Redwood, MD  Cardiologist:  Dr. Angelena Sandoval     No chief complaint on file.     History of Present Illness: [name] is a 85 y.o. male who presents for lower ext edema.  He has a history of atrial fibrillation, ischemic cardiomyopathy, CAD, HTN, PVCs and colon cancer who is here today for follow up. First cardiac stents placed in the Circumflex and LAD in 2007. The stents were Taxus drug eluting stents. LV function was normal at that time. I saw him in July 2018 and he had c/o leg weakness, dizziness. He had one fall due to sudden onset of weakness in his left leg. 48 hour cardiac monitor showed sinus bradycardia with PVCs, PACs and a short run of a wide complex tachycardia. Echo 01/25/17 with normal LV systolic function, no significant valve disease. He was admitted to [facility] 08/08/17 with a NSTEMI and was found to have severe disease in the LAD and Circumflex, both treated with drug eluting stents. (2 drug eluting stents placed in the Circumflex and one drug eluting stent placed in the LAD). He was also found to have atrial fibrillation and has been on Eliquis. Echo September 2019 with LVEF=60%, no significant valve disease. He had rectal bleeding and was found to have a mass in the sigmoid colon that turned out to be adenocarcinoma. He had a laparoscopic sigmoid colectomy in June 2019. He is being followed by Dr. Benay Sandoval in the Oncology office. Admitted to [facility] January 2020 after lung biopsy on left upper lobe nodule resulted in a pneumothorax requiring a chest tube placement. He has had chemotherapy and radiation and has completed his therapy.   Last visit 05/26/19 with Dr. Angelena Sandoval The patient denies any chest pain, dyspnea, palpitations, orthopnea, PND, dizziness, near syncope or syncope. Recent dyspnea and found to have pneumonia vs enlarging lung tumor. On antibiotics per  Oncology. Mostly concerned today about lower ext edema. Both legs and feet swelling for past few months. Minimal improvement at night. He is confused about his medications. Still taking Lipitor but Lisinopril stopped in Oncology due to low BP. He is not on a diuretic.   Today he presents with lower ext edema.  2+ to knees.  He is on lasix 20 mg daily.  At times his lower ext are red,  And he has discomfort.  He had venous dopplers today that were neg for DVT.  He does have elevated salt intake, his son and daughter have been cutting this back, but he has difficulty with this at times.  He has been wearing support stockings and elevating his legs which have helped.  No chest pain.  Mild SOB.     Recent CT of chest with changes consistent with post treatment and radiation changes.  He did have a new small Lt pl. Effusion.   Also noted prostatomegaly.  He has seen urologist and now treated for BPH.    Past Medical History:  Diagnosis Date  . A-fib ([place])   . Arthritis   . BPH (benign prostatic hyperplasia)   . Cancer [facility])    colon cancer   . Chest pain, unspecified   . Chronic kidney disease   . Coronary atherosclerosis of unspecified type of vessel, native or graft   . Dizziness and giddiness   . Hyperlipidemia   . Hypertension   . Lung cancer (  [place])    bx's done 08/09/2017  . Myocardial infarction ([place]) 12 years ago   x2  . Non-ST elevation (NSTEMI) myocardial infarction ([place]) 08/2017   dx afib,replaced stent  . Peripheral neuropathy   . PVD (peripheral vascular disease) ([place])   . Shortness of breath    exertion  . Vitamin B12 deficiency     Past Surgical History:  Procedure Laterality Date  . COLON SURGERY     partial colectomy Dr. Marcello Sandoval 12-14-17  . CORONARY ANGIOPLASTY     4 stents  . CORONARY STENT INTERVENTION N/A 08/09/2017   Procedure: CORONARY STENT INTERVENTION;  Surgeon: David Bush, MD;  Location: [facility] CV LAB;  Service: Cardiovascular;  Laterality: N/A;  .  FLEXIBLE SIGMOIDOSCOPY N/A 11/12/2017   Procedure: FLEXIBLE SIGMOIDOSCOPY;  Surgeon: David Shipper, MD;  Location: [facility] ENDOSCOPY;  Service: Endoscopy;  Laterality: N/A;  . HERNIA REPAIR     right inguinal  . INGUINAL HERNIA REPAIR  06/14/2012   Procedure: HERNIA REPAIR INGUINAL ADULT;  Surgeon: David Hollingshead, MD;  Location: [facility];  Service: General;  Laterality: Right;  . INSERTION OF MESH  06/14/2012   Procedure: INSERTION OF MESH;  Surgeon: David Hollingshead, MD;  Location: [facility];  Service: General;  Laterality: Right;  . LAPAROSCOPIC PARTIAL COLECTOMY N/A 12/26/2017   Procedure: LAPAROSCOPIC PARTIAL COLECTOMY ERAS PATHWAY;  Surgeon: David Ruff, MD;  Location: [facility] ORS;  Service: General;  Laterality: N/A;  . LEFT HEART CATH AND CORONARY ANGIOGRAPHY N/A 08/09/2017   Procedure: LEFT HEART CATH AND CORONARY ANGIOGRAPHY;  Surgeon: David Bush, MD;  Location: [facility] CV LAB;  Service: Cardiovascular;  Laterality: N/A;  . PROCTOSCOPY N/A 12/26/2017   Procedure: PROCTOSCOPY;  Surgeon: David Ruff, MD;  Location: [facility];  Service: General;  Laterality: N/A;  . SINUS SURGERY WITH INSTATRAK  1985     Current Outpatient Medications  Medication Sig Dispense Refill  . albuterol (VENTOLIN HFA) 108 (90 Base) MCG/ACT inhaler Inhale 2 puffs into the lungs every 6 (six) hours as needed.    [name] apixaban (ELIQUIS) 5 MG TABS tablet Take 1 tablet (5 mg total) by mouth 2 (two) times daily. 60 tablet 0  . aspirin EC 81 MG tablet Take 1 tablet (81 mg total) by mouth daily. 90 tablet 3  . atorvastatin (LIPITOR) 20 MG tablet Take 20 mg by mouth daily.    . calcitonin, salmon, (MIACALCIN/FORTICAL) 200 UNIT/ACT nasal spray 1 spray daily.    [name] Sodium (COLACE PO) Take 100 mg by mouth daily as needed.    . finasteride (PROSCAR) 5 MG tablet Take 5 mg by mouth at bedtime.    . furosemide (LASIX) 20 MG tablet Take 1 tablet (20 mg total) by mouth daily. Take 20 mg once a day except for on Wednesdays take  20 mg twice a day 180 tablet 3  . metoprolol succinate (TOPROL-XL) 25 MG 24 hr tablet Take 3 tablets (75 mg total) by mouth daily. 90 tablet 9  . Polyethylene Glycol 3350 (MIRALAX PO) Take 17 g by mouth daily as needed.    . potassium chloride (KLOR-CON) 10 MEQ tablet Take 10 mEq by mouth daily.    . Probiotic Product (PROBIOTIC DAILY PO) Take 1 tablet by mouth daily.    . tamsulosin (FLOMAX) 0.4 MG CAPS capsule Take 0.4 mg by mouth at bedtime.    . vitamin B-12 (CYANOCOBALAMIN) 1000 MCG tablet Take 1,000 mcg by mouth daily.     No current  facility-administered medications for this visit.    Allergies:   Gadolinium derivatives    Social History:  The patient  reports that he quit smoking about 38 years ago. He has never used smokeless tobacco. He reports that he does not drink alcohol or use drugs.   Family History:  The patient's family history includes Arthritis in his sister; Heart attack (age of onset: 87) in his father; Hypothyroidism in his daughter.    ROS:  General:no colds or fevers, no weight changes Skin:no rashes or ulcers HEENT:no blurred vision, no congestion CV:see HPI PUL:see HPI GI:no diarrhea constipation or melena, no indigestion GU:no hematuria, no dysuria MS:no joint pain, no claudication Neuro:no syncope, no lightheadedness Endo:no diabetes, no thyroid disease  Wt Readings from Last 3 Encounters:  07/02/19 154 lb (69.9 kg)  07/02/19 158 lb (71.7 kg)  06/27/19 158 lb (71.7 kg)     PHYSICAL EXAM: VS:  BP 110/62   Pulse 76   Ht 5\' 11"  (1.803 m)   Wt 154 lb (69.9 kg)   SpO2 92%   BMI 21.48 kg/m  , BMI Body mass index is 21.48 kg/m. General:Pleasant affect, NAD Skin:Warm and dry, brisk capillary refill HEENT:normocephalic, sclera clear, mucus membranes moist Neck:supple, no JVD, no bruits  Heart:S1S2 RRR without murmur, gallup, rub or click Lungs:clear without rales, rhonchi, or wheezes OIN:OMVE, non tender, + BS, do not palpate liver spleen or  masses Ext:no lower ext edema, 2+ pedal pulses, 2+ radial pulses Neuro:alert and oriented, MAE, follows commands, + facial symmetry    EKG:  EKG is  NOT ordered today.    Recent Labs: 07/14/2018: Magnesium 1.8 05/21/2019: ALT 15 07/02/2019: BUN 27; Creatinine, Ser 1.08; Hemoglobin 11.0; NT-Pro BNP 3,310; Platelets 343; Potassium 4.6; Sodium 139    Lipid Panel    Component Value Date/Time   CHOL 160 08/09/2017 0359   TRIG 74 08/09/2017 0359   HDL 41 08/09/2017 0359   CHOLHDL 3.9 08/09/2017 0359   VLDL 15 08/09/2017 0359   LDLCALC 104 (H) 08/09/2017 0359       Other studies Reviewed: Additional studies/ records that were reviewed today include: .  ECHO 03/20/18 Study Conclusions  - Left ventricle: The cavity size was normal. Wall thickness was   normal. Systolic function was normal. The estimated ejection   fraction was in the range of 60% to 65%. Wall motion was normal;   there were no regional wall motion abnormalities. Features are   consistent with a pseudonormal left ventricular filling pattern,   with concomitant abnormal relaxation and increased filling   pressure (grade 2 diastolic dysfunction). - Aortic valve: There was trivial regurgitation.  ASSESSMENT AND PLAN:  1.  Lower ext edema and mild SOB - agree with decrease salt in diet and support stockings.  Will check echo with hx of cardiomyopathy in 2019 with EF 35-40%, this had improved to normal on last echo later in 2019.  Will check CBC for anemia with Hgb of 9.1 in 11/20 which could add to edema.  He has had hypotension at times so will diuresis slowly- tomorrow 20 mg BID and again on Friday other days his regular 20 mg.  Would also add and extra 20 mg weekly.  Check BMP with medication adjustment and will recheck in 1-2 weeks.  He has follow up with Dr. Angelena Sandoval next week.   2.  CAD with prior stent, no angina continue ASA, statin and BB  3.  ICM with improvement will recheck Echo  4.  PAF stable on  anticoagulation, Eliquis - sinus today by exam.   5.  Colon cancer/lung cancer, has completed chemo and radiation, s/p sigmoid colectomy.      Current medicines are reviewed with the patient today.  The patient Has no concerns regarding medicines.  The following changes have been made:  See above Labs/ tests ordered today include:see above  Disposition:   FU:  see above  Signed, Cecilie Kicks, NP  07/03/2019 9:55 PM    [facility] [address], [place], [place], [place] Phone: [phone]; Fax: [phone]

## 2019-07-02 NOTE — Patient Instructions (Signed)
Medication Instructions:  Tomorrow take 1 tablet of your Lasix in the morning and 1 tablet in the evening, and repeat again on Saturday, then take 20 mg once a day except for on Wednesdays take 20 mg twice a day   *If you need a refill on your cardiac medications before your next appointment, please call your pharmacy*  Lab Work: BMET, CBC & PRO BNP- Today   If you have labs (blood work) drawn today and your tests are completely normal, you will receive your results only by: Marland Kitchen MyChart Message (if you have MyChart) OR . A paper copy in the mail If you have any lab test that is abnormal or we need to change your treatment, we will call you to review the results.  Testing/Procedures: Your physician has requested that you have an echocardiogram. Echocardiography is a painless test that uses sound waves to create images of your heart. It provides your doctor with information about the size and shape of your heart and how well your heart's chambers and valves are working. This procedure takes approximately one hour. There are no restrictions for this procedure.    Follow-Up: You are scheduled to see Dr. Angelena Form on 07/10/2019 @ 2:00 pm   Other Instructions None

## 2019-07-02 NOTE — Progress Notes (Signed)
Office Visit Note   Patient: [name]           Date of Birth: 1929/10/17           MRN: [id number] Visit Date: 07/02/2019              Requested by: Marton Redwood, MD [address] [place],  [place] [address] PCP: Marton Redwood, MD   Assessment & Plan: Visit Diagnoses:  1. Left foot pain   2. Bilateral leg edema     Plan: Advised patient that I am more concerned with his bilateral lower extremity edema and calf pain.  I ordered a stat bilateral lower extremity venous Doppler.  I will await callback report.  Study should be done before his cardiology appointment at 3:00 today.  I do not see a painful callus on the bottom of his left foot.  Patient's daughter had left a message earlier stating that he is seen a podiatrist.  Patient states that he is not aware of this appointment.  I did make him an appointment to see Dr. Meridee Score here in our office in 4 weeks and we will see if he can give some input on foot care.  Patient does have some arthritic changes in the left foot.  Follow-Up Instructions: Return in about 4 weeks (around 07/30/2019) for left foot pain.   Orders:  Orders Placed This Encounter  Procedures  . XR Foot Complete Left  . VAS [place] LOWER EXTREMITY VENOUS (DVT)   No orders of the defined types were placed in this encounter.     Procedures: No procedures performed   Clinical Data: No additional findings.   Subjective: Chief Complaint  Patient presents with  . Left Foot - Pain    HPI 85 year old white male comes in today with complaints of bilateral lower extremity edema, calf pain and left foot pain.  Patient's medical history reviewed.  He is followed by oncology for lung cancer and has had radiation treatments.  Patient states that he has had off and on pain left foot for several years.  Localizes most of his discomfort to the fifth toe MTP joint.  Patient's daughter left a message with our office stating that he sees a podiatrist.  Patient stated  that he is not aware of an appointment.  He has had progressively worsening bilateral lower extremity edema along with calf pain.  He is also been having an ongoing nonproductive cough.  States that he has an appointment to see his cardiologist this afternoon.  Was recently seen by oncologist.  He has not had venous Doppler of the lower extremities. Review of Systems Patient complains of chronic cough and lower extremity swelling.  Objective: Vital Signs: BP 105/72   Pulse (!) 139   Ht 5\' 11"  (1.803 m)   Wt 158 lb (71.7 kg)   BMI 22.04 kg/m   Physical Exam HENT:     Head: Normocephalic.  Pulmonary:     Effort: No respiratory distress.  Musculoskeletal:     Comments: Gait is steady.  Ambulates with a cane.  Patient has significant lower extremity swelling with pitting edema.  Bilateral calves are tender.  No signs of cellulitis.  Left foot he is tender around the fifth toe MTP joint.  Does not have a hardened callus.  Neurological:     Mental Status: He is alert and oriented to person, place, and time.     Ortho Exam  Specialty Comments:  No specialty comments available.  Imaging: No results found.   PMFS History: Patient Active Problem List   Diagnosis Date Noted  . Malignant neoplasm of bronchus of left upper lobe ([place]) 07/31/2018  . Left upper lobe pulmonary nodule   . Acute respiratory failure with hypoxia ([place]) 07/12/2018  . Chest tube in place   . Pneumothorax of left lung after biopsy 07/09/2018  . Lung nodule 07/09/2018  . Vitamin B12 deficiency 12/29/2017  . Cancer of sigmoid colon s/p sigmoid colectomy 12/26/2017 12/26/2017  . Long term (current) use of anticoagulants [Z79.01] 08/15/2017  . Ischemic cardiomyopathy 08/13/2017  . Hypertension 08/13/2017  . Atrial fibrillation ([place]) 08/08/2017  . Hyperlipidemia 11/09/2009  . LBBB 11/09/2009  . Coronary atherosclerosis 10/05/2008  . DIZZINESS 10/05/2008  . CHEST PAIN-UNSPECIFIED 10/05/2008   Past Medical  History:  Diagnosis Date  . A-fib ([place])   . Arthritis   . BPH (benign prostatic hyperplasia)   . Cancer [facility])    colon cancer   . Chest pain, unspecified   . Chronic kidney disease   . Coronary atherosclerosis of unspecified type of vessel, native or graft   . Dizziness and giddiness   . Hyperlipidemia   . Hypertension   . Lung cancer ([place])    bx's done 08/09/2017  . Myocardial infarction ([place]) 12 years ago   x2  . Non-ST elevation (NSTEMI) myocardial infarction ([place]) 08/2017   dx afib,replaced stent  . Peripheral neuropathy   . PVD (peripheral vascular disease) ([place])   . Shortness of breath    exertion  . Vitamin B12 deficiency     Family History  Problem Relation Age of Onset  . Heart attack Father 29  . Arthritis Sister   . Hypothyroidism Daughter   . Colon cancer Neg Hx   . Esophageal cancer Neg Hx   . Rectal cancer Neg Hx   . Stomach cancer Neg Hx     Past Surgical History:  Procedure Laterality Date  . COLON SURGERY     partial colectomy Dr. Marcello Moores 12-14-17  . CORONARY ANGIOPLASTY     4 stents  . CORONARY STENT INTERVENTION N/A 08/09/2017   Procedure: CORONARY STENT INTERVENTION;  Surgeon: Nelva Bush, MD;  Location: [place] [facility];  Service: Cardiovascular;  Laterality: N/A;  . FLEXIBLE SIGMOIDOSCOPY N/A 11/12/2017   Procedure: FLEXIBLE SIGMOIDOSCOPY;  Surgeon: Irene Shipper, MD;  [facility];  Service: Endoscopy;  Laterality: N/A;  . HERNIA REPAIR     right inguinal  . INGUINAL HERNIA REPAIR  06/14/2012   Procedure: HERNIA REPAIR INGUINAL ADULT;  Surgeon: Odis Hollingshead, MD;  Location: [place];  Service: General;  Laterality: Right;  . INSERTION OF MESH  06/14/2012   Procedure: INSERTION OF MESH;  Surgeon: Odis Hollingshead, MD;  Location: [place];  Service: General;  Laterality: Right;  . LAPAROSCOPIC PARTIAL COLECTOMY N/A 12/26/2017   Procedure: LAPAROSCOPIC PARTIAL COLECTOMY ERAS PATHWAY;  Surgeon: Leighton Ruff, MD;  [facility];   Service: General;  Laterality: N/A;  . LEFT HEART CATH AND CORONARY ANGIOGRAPHY N/A 08/09/2017   Procedure: LEFT HEART CATH AND CORONARY ANGIOGRAPHY;  Surgeon: Nelva Bush, MD;  Location: [place] [facility];  Service: Cardiovascular;  Laterality: N/A;  . PROCTOSCOPY N/A 12/26/2017   Procedure: PROCTOSCOPY;  Surgeon: Leighton Ruff, MD;  [facility];  Service: General;  Laterality: N/A;  . [place]   Social History   Occupational History  . Occupation: Magazine features editor    Comment: [employer]  Tobacco Use  . Smoking status: Former Smoker    Quit date: 07/10/1981    Years since quitting: 38.0  . Smokeless tobacco: Never Used  Substance and Sexual Activity  . Alcohol use: No  . Drug use: No  . Sexual activity: Not on file

## 2019-07-03 ENCOUNTER — Telehealth: Payer: Self-pay

## 2019-07-03 LAB — BASIC METABOLIC PANEL
BUN/Creatinine Ratio: 25 — ABNORMAL HIGH (ref 10–24)
BUN: 27 mg/dL (ref 8–27)
CO2: 23 mmol/L (ref 20–29)
Calcium: 8.8 mg/dL (ref 8.6–10.2)
Chloride: 103 mmol/L (ref 96–106)
Creatinine, Ser: 1.08 mg/dL (ref 0.76–1.27)
GFR calc Af Amer: 71 mL/min/{1.73_m2} (ref >59)
GFR calc non Af Amer: 61 mL/min/{1.73_m2} (ref >59)
Glucose: 110 mg/dL — ABNORMAL HIGH (ref 65–99)
Potassium: 4.6 mmol/L (ref 3.5–5.2)
Sodium: 139 mmol/L (ref 134–144)

## 2019-07-03 LAB — CBC
Hematocrit: 34.3 % — ABNORMAL LOW (ref 37.5–51.0)
Hemoglobin: 11 g/dL — ABNORMAL LOW (ref 13.0–17.7)
MCH: 28.9 pg (ref 26.6–33.0)
MCHC: 32.1 g/dL (ref 31.5–35.7)
MCV: 90 fL (ref 79–97)
Platelets: 343 10*3/uL (ref 150–450)
RBC: 3.8 x10E6/uL — ABNORMAL LOW (ref 4.14–5.80)
RDW: 13.7 % (ref 11.6–15.4)
WBC: 8.4 10*3/uL (ref 3.4–10.8)

## 2019-07-03 LAB — PRO B NATRIURETIC PEPTIDE: NT-Pro BNP: 3310 pg/mL — ABNORMAL HIGH (ref 0–486)

## 2019-07-03 NOTE — Telephone Encounter (Signed)
-----   Message from Isaiah Serge, NP sent at 07/03/2019  8:47 AM EST ----- Labs are stable, and his hgb is improved from 9.1 in November to 11.0 now.

## 2019-07-03 NOTE — Telephone Encounter (Signed)
Advised labs stable, improved Hgb, no changes in plan.  Confirmed next appt on 12/31

## 2019-07-08 ENCOUNTER — Ambulatory Visit (HOSPITAL_COMMUNITY): Payer: Medicare Other

## 2019-07-08 ENCOUNTER — Other Ambulatory Visit: Payer: Medicare Other

## 2019-07-09 ENCOUNTER — Ambulatory Visit (INDEPENDENT_AMBULATORY_CARE_PROVIDER_SITE_OTHER): Payer: Medicare Other | Admitting: Podiatry

## 2019-07-09 ENCOUNTER — Ambulatory Visit: Payer: Medicare Other | Admitting: Podiatry

## 2019-07-09 ENCOUNTER — Encounter

## 2019-07-09 ENCOUNTER — Other Ambulatory Visit: Payer: Self-pay

## 2019-07-09 ENCOUNTER — Encounter: Payer: Self-pay | Admitting: Podiatry

## 2019-07-09 DIAGNOSIS — M79675 Pain in left toe(s): Secondary | ICD-10-CM

## 2019-07-09 DIAGNOSIS — B351 Tinea unguium: Secondary | ICD-10-CM

## 2019-07-09 DIAGNOSIS — L84 Corns and callosities: Secondary | ICD-10-CM | POA: Diagnosis not present

## 2019-07-09 DIAGNOSIS — M79674 Pain in right toe(s): Secondary | ICD-10-CM

## 2019-07-09 DIAGNOSIS — M216X2 Other acquired deformities of left foot: Secondary | ICD-10-CM | POA: Insufficient documentation

## 2019-07-09 DIAGNOSIS — I255 Ischemic cardiomyopathy: Secondary | ICD-10-CM | POA: Diagnosis not present

## 2019-07-09 NOTE — Progress Notes (Signed)
Complaint:  Visit Type: Patient presents  to my office for  preventative foot care services. Complaint: Patient states" my nails have grown long and thick and become painful to walk and wear shoes" Patient also has painful area under the outside ball of left foot. He says he had a cyst come out from the painful area left foot.   The patient presents for preventative foot care services.  Podiatric Exam: Vascular: dorsalis pedis and posterior tibial pulses are weakly  palpable bilateral. Capillary return is immediate. Temperature gradient is WNL. Skin turgor WNL  Sensorium: Normal Semmes Weinstein monofilament test. Normal tactile sensation bilaterally. Nail Exam: Pt has thick disfigured discolored nails with subungual debris noted bilateral entire nail hallux through fifth toenails Ulcer Exam: There is no evidence of ulcer or pre-ulcerative changes or infection. Orthopedic Exam: Muscle tone and strength are WNL. No limitations in general ROM. No crepitus or effusions noted. Foot type and digits show no abnormalities. Plantar flexed fifth metatarsal left foot. Skin: No Porokeratosis. No infection or ulcers.  Blister/pre-ulcerous skin lesion sub 5th met  left foot.  Diagnosis:  Onychomycosis, , Pain in right toe, pain in left toes  Treatment & Plan Procedures and Treatment: Consent by patient was obtained for treatment procedures.   Debridement of mycotic and hypertrophic toenails, 1 through 5 bilateral and clearing of subungual debris. No ulceration, no infection noted.  Padding was applied to his insole in left shoes to off-weight bear this area during gait.   Discussed this patient's feet.  Told him that he has a fungus but that I do not treat fungus with medication by mouth on older patients since the medicine can cause liver damage.  Also told this patient that the painful skin area was caused by a plantarflexed fifth metatarsal head left foot.  Told him that padding and off weightbearing at this  painful site is my first treatment for this condition.  Patient to return to the office in 3 months for continued nail care and continued evaluation of his painful skin lesion.  Told him that surgical removal of the bone is a consideration. Return Visit-Office Procedure: Patient instructed to return to the office for a follow up visit 3 months for continued evaluation and treatment.    Gardiner Barefoot DPM

## 2019-07-10 ENCOUNTER — Ambulatory Visit (INDEPENDENT_AMBULATORY_CARE_PROVIDER_SITE_OTHER): Payer: Medicare Other | Admitting: Cardiovascular Disease

## 2019-07-10 ENCOUNTER — Encounter: Payer: Self-pay | Admitting: Cardiovascular Disease

## 2019-07-10 VITALS — BP 120/70 | HR 68 | Ht 71.0 in | Wt 145.4 lb

## 2019-07-10 DIAGNOSIS — I480 Paroxysmal atrial fibrillation: Secondary | ICD-10-CM | POA: Diagnosis not present

## 2019-07-10 DIAGNOSIS — I255 Ischemic cardiomyopathy: Secondary | ICD-10-CM

## 2019-07-10 DIAGNOSIS — I2510 Atherosclerotic heart disease of native coronary artery without angina pectoris: Secondary | ICD-10-CM | POA: Diagnosis not present

## 2019-07-10 DIAGNOSIS — I5032 Chronic diastolic (congestive) heart failure: Secondary | ICD-10-CM

## 2019-07-10 DIAGNOSIS — I10 Essential (primary) hypertension: Secondary | ICD-10-CM | POA: Diagnosis not present

## 2019-07-10 MED ORDER — FUROSEMIDE 20 MG PO TABS: 20.0000 mg | ORAL_TABLET | Freq: Two times a day (BID) | ORAL | 3 refills | Status: AC

## 2019-07-10 NOTE — Progress Notes (Signed)
Chief Complaint  Patient presents with  . Follow-up    CAD    History of Present Illness: 85 yo male with history of atrial fibrillation, ischemic cardiomyopathy, CAD, HTN, PVCs and colon cancer who is here today for follow up. First cardiac stents placed in the Circumflex and LAD in 2007. The stents were Taxus drug eluting stents. LV function was normal at that time. I saw him in July 2018 and he had c/o leg weakness, dizziness. He had one fall due to sudden onset of weakness in his left leg. 48 hour cardiac monitor showed sinus bradycardia with PVCs, PACs and a short run of a wide complex tachycardia. Echo 01/25/17 with normal LV systolic function, no significant valve disease. He was admitted to [facility] 08/08/17 with a NSTEMI and was found to have severe disease in the LAD and Circumflex, both treated with drug eluting stents. (2 drug eluting stents placed in the Circumflex and one drug eluting stent placed in the LAD). He was also found to have atrial fibrillation and has been on Eliquis. Echo September 2019 with LVEF=60%, no significant valve disease. He had rectal bleeding and was found to have a mass in the sigmoid colon that turned out to be adenocarcinoma. He had a laparoscopic sigmoid colectomy in June 2019. He is being followed by [name] in the Oncology office. Admitted to [facility] January 2020 after lung biopsy on left upper lobe nodule resulted in a pneumothorax requiring a chest tube placement. 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 He has had chemotherapy and radiation and has completed his therapy. He was seen in our office 07/02/19 by [name] with c/o worsened LE edema. He was taking Lasix 20 mg daily. Venous dopplers 07/02/19 with no evidence of DVT. Lasix was increased to 20 mg po BID for several days.   He is here today for follow up. The patient denies any chest pain, dyspnea, palpitations,  orthopnea, PND, dizziness, near syncope or syncope. His lower extremity edema is  much improved with lasix, compression stockings and leg elevation. He is down 8 lbs since his visit one week ago.    Primary Care Physician: [name]  Past Medical History:  Diagnosis Date  . A-fib ([place])   . Arthritis   . BPH (benign prostatic hyperplasia)   . Cancer [facility])    colon cancer   . Chest pain, unspecified   . Chronic kidney disease   . Coronary atherosclerosis of unspecified type of vessel, native or graft   . Dizziness and giddiness   . Hyperlipidemia   . Hypertension   . Lung cancer ([place])    bx's done 08/09/2017  . Myocardial infarction ([place]) 12 years ago   x2  . Non-ST elevation (NSTEMI) myocardial infarction ([place]) 08/2017   dx afib,replaced stent  . Peripheral neuropathy   . PVD (peripheral vascular disease) ([place])   . Shortness of breath    exertion  . Vitamin B12 deficiency     Past Surgical History:  Procedure Laterality Date  . COLON SURGERY     partial colectomy Dr. Marcello Sandoval 12-14-17  . CORONARY ANGIOPLASTY     4 stents  . CORONARY STENT INTERVENTION N/A 08/09/2017   Procedure: CORONARY STENT INTERVENTION;  Surgeon: [name];  Location: [place] [facility];  Service: Cardiovascular;  Laterality: N/A;  . FLEXIBLE SIGMOIDOSCOPY N/A 11/12/2017   Procedure: FLEXIBLE SIGMOIDOSCOPY;  Surgeon: [name];  [facility];  Service: Endoscopy;  Laterality: N/A;  . HERNIA REPAIR     right inguinal  . INGUINAL HERNIA REPAIR  06/14/2012   Procedure: HERNIA REPAIR INGUINAL ADULT;  Surgeon: [name];  Location: [place];  Service: General;  Laterality: Right;  . INSERTION OF MESH  06/14/2012   Procedure: INSERTION OF MESH;  Surgeon: [name];  Location: [place];  Service: General;  Laterality: Right;  . LAPAROSCOPIC PARTIAL COLECTOMY N/A 12/26/2017   Procedure: LAPAROSCOPIC PARTIAL COLECTOMY ERAS PATHWAY;  Surgeon: [name];  [facility];  Service: General;  Laterality: N/A;  . LEFT HEART CATH AND  CORONARY ANGIOGRAPHY N/A 08/09/2017   Procedure: LEFT HEART CATH AND CORONARY ANGIOGRAPHY;  Surgeon: [name];  Location: [place] [facility];  Service: Cardiovascular;  Laterality: N/A;  . PROCTOSCOPY N/A 12/26/2017   Procedure: PROCTOSCOPY;  Surgeon: [name];  [facility];  Service: General;  Laterality: N/A;  . SINUS SURGERY WITH INSTATRAK  1985    Current Outpatient Medications  Medication Sig Dispense Refill  . albuterol (VENTOLIN HFA) 108 (90 Base) MCG/ACT inhaler Inhale 2 puffs into the lungs every 6 (six) hours as needed.    Marland Kitchen apixaban (ELIQUIS) 5 MG TABS tablet Take 1 tablet (5 mg total) by mouth 2 (two) times daily. 60 tablet 0  . aspirin EC 81 MG tablet Take 1 tablet (81 mg total) by mouth daily. 90 tablet 3  . atorvastatin (LIPITOR) 20 MG tablet Take 20 mg by mouth daily.    . calcitonin, salmon, (MIACALCIN/FORTICAL) 200 UNIT/ACT nasal spray 1 spray daily.    . finasteride (PROSCAR) 5 MG tablet Take 5 mg by mouth at bedtime.    . furosemide (LASIX) 20 MG tablet Take 1 tablet (20 mg total) by mouth 2 (two) times daily. 180 tablet 3  . metoprolol succinate (TOPROL-XL) 25 MG 24 hr tablet Take 3 tablets (75 mg total) by mouth daily. 90 tablet 9  . potassium chloride (KLOR-CON) 10 MEQ tablet Take 10 mEq by mouth daily.    . Probiotic Product (PROBIOTIC DAILY PO) Take 1 tablet by mouth daily.    . tamsulosin (FLOMAX) 0.4 MG CAPS capsule Take 0.4 mg by mouth at bedtime.    . vitamin B-12 (CYANOCOBALAMIN) 1000 MCG tablet Take 1,000 mcg by mouth daily.     No current facility-administered medications for this visit.    Allergies  Allergen Reactions  . Gadolinium Derivatives Hives, Itching and Other (See Comments)    [name] s/w [name]. We observed him for 15 minutes. He did not need to take Benadryl. The symptoms began to subside before he left.     Social History   Socioeconomic History  . Marital status: Divorced    Spouse name: Not on file  .  Number of children: 3  . Years of education: Not on file  . Highest education level: Not on file  Occupational History  . Occupation: Magazine features editor    Comment: [employer]  Tobacco Use  . Smoking status: Former Smoker    Quit date: 07/10/1981    Years since quitting: 38.0  . Smokeless tobacco: Never Used  Substance and Sexual Activity  . Alcohol use: No  . Drug use: No  . Sexual activity: Not on file  Other Topics Concern  . Not on file  Social History Narrative   Regular exercise         Social Determinants of Health   Financial Resource Strain:   .  Difficulty of Paying Living Expenses: Not on file  Food Insecurity:   . Worried About Charity fundraiser in the Last Year: Not on file  . Ran Out of Food in the Last Year: Not on file  Transportation Needs: No Transportation Needs  . Lack of Transportation (Medical): No  . Lack of Transportation (Non-Medical): No  Physical Activity:   . Days of Exercise per Week: Not on file  . Minutes of Exercise per Session: Not on file  Stress:   . Feeling of Stress : Not on file  Social Connections:   . Frequency of Communication with Friends and Family: Not on file  . Frequency of Social Gatherings with Friends and Family: Not on file  . Attends Religious Services: Not on file  . Active Member of Clubs or Organizations: Not on file  . Attends Archivist Meetings: Not on file  . Marital Status: Not on file  Intimate Partner Violence:   . Fear of Current or Ex-Partner: Not on file  . Emotionally Abused: Not on file  . Physically Abused: Not on file  . Sexually Abused: Not on file    Family History  Problem Relation Age of Onset  . Heart attack Father 33  . Arthritis Sister   . Hypothyroidism Daughter   . Colon cancer Neg Hx   . Esophageal cancer Neg Hx   . Rectal cancer Neg Hx   . Stomach cancer Neg Hx     Review of Systems:  As stated in the HPI and otherwise negative.   BP 120/70   Pulse 68    Ht 5\' 11"  (1.803 m)   Wt 145 lb 6.4 oz (66 kg)   SpO2 94%   BMI 20.28 kg/m   Physical Examination:  General: Well developed, well nourished, NAD  HEENT: OP clear, mucus membranes moist  SKIN: warm, dry. No rashes. Neuro: No focal deficits  Musculoskeletal: Muscle strength 5/5 all ext  Psychiatric: Mood and affect normal  Neck: No JVD, no carotid bruits, no thyromegaly, no lymphadenopathy.  Lungs:Clear bilaterally, no wheezes, rhonci, crackles Cardiovascular: Regular rate and rhythm. No murmurs, gallops or rubs. Abdomen:Soft. Bowel sounds present. Non-tender.  Extremities: Trace bilateral lower extremity edema. Pulses are 2 + in the bilateral DP/PT.  Echo September 2019: - Left ventricle: The cavity size was normal. Wall thickness was   normal. Systolic function was normal. The estimated ejection   fraction was in the range of 60% to 65%. Wall motion was normal;   there were no regional wall motion abnormalities. Features are   consistent with a pseudonormal left ventricular filling pattern,   with concomitant abnormal relaxation and increased filling   pressure (grade 2 diastolic dysfunction). - Aortic valve: There was trivial regurgitation.  Cardiac cath 08/09/17: 1. Severe 2-vessel coronary artery disease, including 80% in-stent restenosis of the mid LAD with disease extending beyond the distal stent edge, as well as 70% ostial/proximal LCx disease followed by multifocal mid and distal LCx in-stent restenosis of up to 99%. 2. Moderate, non-obstructive RCA disease. 3. Normal left ventricular filling pressure. 4. Successful PCI to mid LAD with placement of Resolute Onyx 2.75 x 34 mm drug eluting stent (post-dilated to 3.1 mm) with 0% residual stenosis and TIMI-3 flow. 5. Successful PCI to LCx with placement of Resolute Onyx drug-eluting stents extending from the ostium to the proximal segment of old stent (2.5 x 18 mm) and covering the distal 1/3 of old stent extending into the  distal  LCx (2.0 x 30 mm) with 0% residual stenosis and TIMI-3 flow.  EKG:  EKG is not ordered today. The ekg ordered today demonstrates   Recent Labs: 07/14/2018: Magnesium 1.8 05/21/2019: ALT 15 07/02/2019: BUN 27; Creatinine, Ser 1.08; Hemoglobin 11.0; NT-Pro BNP 3,310; Platelets 343; Potassium 4.6; Sodium 139   Lipid Panel    Component Value Date/Time   CHOL 160 08/09/2017 0359   TRIG 74 08/09/2017 0359   HDL 41 08/09/2017 0359   CHOLHDL 3.9 08/09/2017 0359   VLDL 15 08/09/2017 0359   LDLCALC 104 (H) 08/09/2017 0359     Wt Readings from Last 3 Encounters:  07/10/19 145 lb 6.4 oz (66 kg)  07/02/19 154 lb (69.9 kg)  07/02/19 158 lb (71.7 kg)     Other studies Reviewed: Additional studies/ records that were reviewed today include: . Review of the above records demonstrates:    Assessment and Plan:   1. CAD without angina: He has no chest pain . Will continue ASA, beta blocker and statin.     2. Atrial fibrillation, paroxysmal: He is in sinus today. Continue beta blocker and Eliquis.    3. HTN: BP controlled. No changes  4. Ischemic cardiomyopathy: LVEF=60% by echo September 2019. Will continue beta blocker. Ace-inh stopped by [name] in September 2020 due to hypotension.    5. Colon cancer/Lung cancer: He is s/p sigmoid colectomy. Now done with chemo and radiation. He is being followed by [name]   6. Chronic diastolic CHF:  Weight is down. Trace LE edema. Continue Lasix 20 mg every day except for 40 mg on M,W,F. He will follow daily weights and use extra lasix as needed.   Current medicines are reviewed at length with the patient today.  The patient does not have concerns regarding medicines.  The following changes have been made:  no change  Labs/ tests ordered today include:   No orders of the defined types were placed in this encounter.   Disposition:   FU with me in 3 months.   Signed, Lauree Chandler, Sandoval 07/10/2019 2:51 PM    [facility] [address], [place], [place]  [address] Phone: [phone]; Fax: [phone]

## 2019-07-10 NOTE — Patient Instructions (Signed)
Medication Instructions:  Your physician has recommended you make the following change in your medication:   INCREASE: furosemide (lasix) to 20 mg twice a day  *If you need a refill on your cardiac medications before your next appointment, please call your pharmacy*  Lab Work: None ordered  If you have labs (blood work) drawn today and your tests are completely normal, you will receive your results only by: Marland Kitchen MyChart Message (if you have MyChart) OR . A paper copy in the mail If you have any lab test that is abnormal or we need to change your treatment, we will call you to review the results.  Testing/Procedures: None ordered  Follow-Up: Follow up with Dr. Angelena Form on 10/13/19 at 10:00 AM  Other Instructions

## 2019-07-14 ENCOUNTER — Ambulatory Visit: Payer: Medicare Other | Admitting: Oncology

## 2019-07-15 ENCOUNTER — Other Ambulatory Visit: Payer: Self-pay

## 2019-07-15 ENCOUNTER — Ambulatory Visit (HOSPITAL_COMMUNITY)
Admission: RE | Admit: 2019-07-15 | Discharge: 2019-07-15 | Disposition: A | Discharge status: Home / Self Care | Payer: Medicare Other | Source: Ambulatory Visit | Attending: Cardiology | Admitting: Cardiology

## 2019-07-15 ENCOUNTER — Other Ambulatory Visit (HOSPITAL_COMMUNITY): Payer: Medicare Other

## 2019-07-15 DIAGNOSIS — I10 Essential (primary) hypertension: Secondary | ICD-10-CM | POA: Diagnosis not present

## 2019-07-15 DIAGNOSIS — I255 Ischemic cardiomyopathy: Secondary | ICD-10-CM | POA: Insufficient documentation

## 2019-07-15 DIAGNOSIS — R0602 Shortness of breath: Secondary | ICD-10-CM | POA: Diagnosis not present

## 2019-07-15 DIAGNOSIS — I358 Other nonrheumatic aortic valve disorders: Secondary | ICD-10-CM | POA: Insufficient documentation

## 2019-07-15 DIAGNOSIS — I4891 Unspecified atrial fibrillation: Secondary | ICD-10-CM | POA: Diagnosis not present

## 2019-07-15 DIAGNOSIS — E785 Hyperlipidemia, unspecified: Secondary | ICD-10-CM | POA: Diagnosis not present

## 2019-07-15 DIAGNOSIS — I447 Left bundle-branch block, unspecified: Secondary | ICD-10-CM | POA: Insufficient documentation

## 2019-07-15 DIAGNOSIS — R609 Edema, unspecified: Secondary | ICD-10-CM

## 2019-07-15 NOTE — Progress Notes (Signed)
  Echocardiogram 2D Echocardiogram has been performed.  [name] 07/15/2019, 2:49 PM

## 2019-07-28 ENCOUNTER — Other Ambulatory Visit: Payer: Self-pay

## 2019-07-28 ENCOUNTER — Inpatient Hospital Stay: Payer: Medicare Other | Attending: Oncology

## 2019-07-28 DIAGNOSIS — C3412 Malignant neoplasm of upper lobe, left bronchus or lung: Secondary | ICD-10-CM

## 2019-07-28 DIAGNOSIS — Z85118 Personal history of other malignant neoplasm of bronchus and lung: Secondary | ICD-10-CM | POA: Diagnosis not present

## 2019-07-28 DIAGNOSIS — Z85038 Personal history of other malignant neoplasm of large intestine: Secondary | ICD-10-CM | POA: Diagnosis not present

## 2019-07-28 DIAGNOSIS — J449 Chronic obstructive pulmonary disease, unspecified: Secondary | ICD-10-CM | POA: Diagnosis not present

## 2019-07-28 LAB — CMP (CANCER CENTER ONLY)
ALT: 11 U/L (ref 0–44)
AST: 15 U/L (ref 15–41)
Albumin: 2.8 g/dL — ABNORMAL LOW (ref 3.5–5.0)
Alkaline Phosphatase: 108 U/L (ref 38–126)
Anion gap: 7 (ref 5–15)
BUN: 24 mg/dL — ABNORMAL HIGH (ref 8–23)
CO2: 28 mmol/L (ref 22–32)
Calcium: 8.5 mg/dL — ABNORMAL LOW (ref 8.9–10.3)
Chloride: 106 mmol/L (ref 98–111)
Creatinine: 1.08 mg/dL (ref 0.61–1.24)
GFR, Est AFR Am: >60 mL/min (ref >60)
GFR, Estimated: >60 mL/min (ref >60)
Glucose, Bld: 143 mg/dL — ABNORMAL HIGH (ref 70–99)
Potassium: 4.1 mmol/L (ref 3.5–5.1)
Sodium: 141 mmol/L (ref 135–145)
Total Bilirubin: 0.6 mg/dL (ref 0.3–1.2)
Total Protein: 6.4 g/dL — ABNORMAL LOW (ref 6.5–8.1)

## 2019-07-28 LAB — CBC WITH DIFFERENTIAL (CANCER CENTER ONLY)
Abs Immature Granulocytes: 0.03 10*3/uL (ref 0.00–0.07)
Basophils Absolute: 0 10*3/uL (ref 0.0–0.1)
Basophils Relative: 0 %
Eosinophils Absolute: 0.1 10*3/uL (ref 0.0–0.5)
Eosinophils Relative: 2 %
HCT: 35.2 % — ABNORMAL LOW (ref 39.0–52.0)
Hemoglobin: 11 g/dL — ABNORMAL LOW (ref 13.0–17.0)
Immature Granulocytes: 1 %
Lymphocytes Relative: 14 %
Lymphs Abs: 0.8 10*3/uL (ref 0.7–4.0)
MCH: 28.6 pg (ref 26.0–34.0)
MCHC: 31.3 g/dL (ref 30.0–36.0)
MCV: 91.7 fL (ref 80.0–100.0)
Monocytes Absolute: 0.5 10*3/uL (ref 0.1–1.0)
Monocytes Relative: 9 %
Neutro Abs: 3.9 10*3/uL (ref 1.7–7.7)
Neutrophils Relative %: 74 %
Platelet Count: 199 10*3/uL (ref 150–400)
RBC: 3.84 MIL/uL — ABNORMAL LOW (ref 4.22–5.81)
RDW: 14.6 % (ref 11.5–15.5)
WBC Count: 5.3 10*3/uL (ref 4.0–10.5)
nRBC: 0 % (ref 0.0–0.2)

## 2019-07-31 ENCOUNTER — Ambulatory Visit (INDEPENDENT_AMBULATORY_CARE_PROVIDER_SITE_OTHER): Payer: Medicare Other | Admitting: Orthopedic Surgery

## 2019-07-31 ENCOUNTER — Ambulatory Visit (HOSPITAL_COMMUNITY)
Admission: RE | Admit: 2019-07-31 | Discharge: 2019-07-31 | Disposition: A | Discharge status: Home / Self Care | Payer: Medicare Other | Source: Ambulatory Visit | Attending: Oncology | Admitting: Oncology

## 2019-07-31 ENCOUNTER — Inpatient Hospital Stay (HOSPITAL_BASED_OUTPATIENT_CLINIC_OR_DEPARTMENT_OTHER): Payer: Medicare Other | Admitting: Oncology

## 2019-07-31 ENCOUNTER — Encounter: Payer: Self-pay | Admitting: Orthopedic Surgery

## 2019-07-31 ENCOUNTER — Other Ambulatory Visit: Payer: Self-pay

## 2019-07-31 VITALS — Ht 71.0 in | Wt 145.0 lb

## 2019-07-31 VITALS — BP 115/82 | HR 73 | Temp 98.5°F | Resp 16 | Ht 71.0 in | Wt 145.9 lb

## 2019-07-31 DIAGNOSIS — M6702 Short Achilles tendon (acquired), left ankle: Secondary | ICD-10-CM | POA: Diagnosis not present

## 2019-07-31 DIAGNOSIS — Z85038 Personal history of other malignant neoplasm of large intestine: Secondary | ICD-10-CM | POA: Diagnosis not present

## 2019-07-31 DIAGNOSIS — R05 Cough: Secondary | ICD-10-CM | POA: Diagnosis not present

## 2019-07-31 DIAGNOSIS — Z85118 Personal history of other malignant neoplasm of bronchus and lung: Secondary | ICD-10-CM | POA: Diagnosis not present

## 2019-07-31 DIAGNOSIS — L97521 Non-pressure chronic ulcer of other part of left foot limited to breakdown of skin: Secondary | ICD-10-CM | POA: Diagnosis not present

## 2019-07-31 DIAGNOSIS — J449 Chronic obstructive pulmonary disease, unspecified: Secondary | ICD-10-CM | POA: Diagnosis not present

## 2019-07-31 DIAGNOSIS — M6701 Short Achilles tendon (acquired), right ankle: Secondary | ICD-10-CM | POA: Diagnosis not present

## 2019-07-31 DIAGNOSIS — C3412 Malignant neoplasm of upper lobe, left bronchus or lung: Secondary | ICD-10-CM

## 2019-07-31 DIAGNOSIS — I872 Venous insufficiency (chronic) (peripheral): Secondary | ICD-10-CM

## 2019-07-31 NOTE — Progress Notes (Signed)
Office Visit Note   Patient: [name]           Date of Birth: 08/30/1929           MRN: [id number] Visit Date: 07/31/2019              Requested by: Marton Redwood, MD [address] [place],  [place] [address] PCP: Marton Redwood, MD  Chief Complaint  Patient presents with   Right Foot - Pain   Left Foot - Pain      HPI: Patient is a 86 year old gentleman who is seen for evaluation for painful ulcer beneath the fifth metatarsal head left foot.  Patient states he also has painful area beneath the right fifth metatarsal head but no open ulcer.  Patient states he is recently had chemotherapy and is concerned that the problems he is having with both legs is secondary to chemotherapy.  Patient also complains of discoloration with swelling in both lower extremities.  Assessment & Plan: Visit Diagnoses:  1. Achilles tendon contracture, bilateral   2. Non-pressure chronic ulcer of other part of left foot limited to breakdown of skin ([place])     Plan: Patient was given a prescription for knee-high compression stocking 15 to 20 mmHg compression for size medium.  Patient was given instruction and demonstrated Achilles stretching to do 5 times a day a minute at a time for both lower extremities.  Follow-Up Instructions: Return in about 4 weeks (around 08/28/2019).   Ortho Exam  Patient is alert, oriented, no adenopathy, well-dressed, normal affect, normal respiratory effort. Examination patient has brawny skin color changes in both legs with venous insufficiency.  He has pitting edema up to the tibial tubercle.  Patient states he is worn compression stockings in the past but does have difficulty getting them on.  Patient has a Wagner grade 1 ulcer beneath the fifth metatarsal head of the left foot.  After informed consent a 10 blade knife was used to debride skin and soft tissue back to healthy viable tissue.  The ulcer is 2 cm in diameter 1 mm deep no exposed bone or tendon no  cellulitis.  Patient has callus beneath the fifth metatarsal head of the right foot but no open ulcer.  Patient has significant Achilles contracture with his knee extended he lacks about 20 degrees to neutral dorsiflexion on both lower extremities.  Patient does have a felt pad in his shoe proximal to the fifth metatarsal head of the left foot and he states that this has helped relieve pressure from the fifth metatarsal head.  Imaging: No results found. No images are attached to the encounter.  Labs: Lab Results  Component Value Date   HGBA1C 6.0 02/14/2011   HGBA1C 5.9 11/09/2009   HGBA1C 6.0 12/14/2008   REPTSTATUS 05/06/2019 FINAL 05/05/2019   CULT (A) 05/05/2019    <10,000 COLONIES/mL INSIGNIFICANT GROWTH Performed at [facility], [place] [address], [place], [place] [address]      Lab Results  Component Value Date   ALBUMIN 2.8 (L) 07/28/2019   ALBUMIN 2.5 (L) 05/21/2019   ALBUMIN 3.6 03/31/2019    Lab Results  Component Value Date   MG 1.8 07/14/2018   MG 2.2 08/12/2017   No results found for: VD25OH  No results found for: PREALBUMIN CBC EXTENDED Latest Ref Rng & Units 07/28/2019 07/02/2019 05/21/2019  WBC 4.0 - 10.5 K/uL 5.3 8.4 5.4  RBC 4.22 - 5.81 MIL/uL 3.84(L) 3.80(L) 3.10(L)  HGB 13.0 - 17.0  g/dL 11.0(L) 11.0(L) 9.1(L)  HCT 39.0 - 52.0 % 35.2(L) 34.3(L) 28.8(L)  PLT 150 - 400 K/uL 199 343 244  NEUTROABS 1.7 - 7.7 K/uL 3.9 - 3.9  LYMPHSABS 0.7 - 4.0 K/uL 0.8 - 0.6(L)     Body mass index is 20.22 kg/m.  Orders:  No orders of the defined types were placed in this encounter.  No orders of the defined types were placed in this encounter.    Procedures: No procedures performed  Clinical Data: No additional findings.  ROS:  All other systems negative, except as noted in the HPI. Review of Systems  Objective: Vital Signs: Ht 5\' 11"  (1.803 m)    Wt 145 lb (65.8 kg)    BMI 20.22 kg/m   Specialty Comments:  No specialty comments  available.  PMFS History: Patient Active Problem List   Diagnosis Date Noted   Pain due to onychomycosis of toenails of both feet 07/09/2019   Plantar flexed metatarsal bone of left foot 07/09/2019   Pre-ulcerative calluses 07/09/2019   Malignant neoplasm of bronchus of left upper lobe ([place]) 07/31/2018   Left upper lobe pulmonary nodule    Acute respiratory failure with hypoxia ([place]) 07/12/2018   Chest tube in place    Pneumothorax of left lung after biopsy 07/09/2018   Lung nodule 07/09/2018   Vitamin B12 deficiency 12/29/2017   Cancer of sigmoid colon s/p sigmoid colectomy 12/26/2017 12/26/2017   Long term (current) use of anticoagulants [Z79.01] 08/15/2017   Ischemic cardiomyopathy 08/13/2017   Hypertension 08/13/2017   Atrial fibrillation ([place]) 08/08/2017   Hyperlipidemia 11/09/2009   LBBB 11/09/2009   Coronary atherosclerosis 10/05/2008   DIZZINESS 10/05/2008   CHEST PAIN-UNSPECIFIED 10/05/2008   Past Medical History:  Diagnosis Date   A-fib ([place])    Arthritis    BPH (benign prostatic hyperplasia)    Cancer ([place])    colon cancer    Chest pain, unspecified    Chronic kidney disease    Coronary atherosclerosis of unspecified type of vessel, native or graft    Dizziness and giddiness    Hyperlipidemia    Hypertension    Lung cancer ([place])    bx's done 08/09/2017   Myocardial infarction ([place]) 12 years ago   x2   Non-ST elevation (NSTEMI) myocardial infarction ([place]) 08/2017   dx afib,replaced stent   Peripheral neuropathy    PVD (peripheral vascular disease) ([facility])    Shortness of breath    exertion   Vitamin B12 deficiency     Family History  Problem Relation Age of Onset   Heart attack Father 12   Arthritis Sister    Hypothyroidism Daughter    Colon cancer Neg Hx    Esophageal cancer Neg Hx    Rectal cancer Neg Hx    Stomach cancer Neg Hx     Past Surgical History:  Procedure Laterality Date   COLON SURGERY      partial colectomy Dr. Marcello Moores 12-14-17   CORONARY ANGIOPLASTY     4 stents   CORONARY STENT INTERVENTION N/A 08/09/2017   Procedure: CORONARY STENT INTERVENTION;  Surgeon: Nelva Bush, MD;  Location: [facility];  Service: Cardiovascular;  Laterality: N/A;   FLEXIBLE SIGMOIDOSCOPY N/A 11/12/2017   Procedure: FLEXIBLE SIGMOIDOSCOPY;  Surgeon: Irene Shipper, MD;  Location[facility];  Service: Endoscopy;  Laterality: N/A;   HERNIA REPAIR     right inguinal   INGUINAL HERNIA REPAIR  06/14/2012   Procedure: HERNIA REPAIR INGUINAL ADULT;  Surgeon:  Odis Hollingshead, MD;  Location: [facility];  Service: General;  Laterality: Right;   INSERTION OF MESH  06/14/2012   Procedure: INSERTION OF MESH;  Surgeon: Odis Hollingshead, MD;  Location: [facility];  Service: General;  Laterality: Right;   LAPAROSCOPIC PARTIAL COLECTOMY N/A 12/26/2017   Procedure: LAPAROSCOPIC PARTIAL COLECTOMY ERAS PATHWAY;  Surgeon: Leighton Ruff, MD;  [facility];  Service: General;  Laterality: N/A;   LEFT HEART CATH AND CORONARY ANGIOGRAPHY N/A 08/09/2017   Procedure: LEFT HEART CATH AND CORONARY ANGIOGRAPHY;  Surgeon: Nelva Bush, MD;  Location: [facility];  Service: Cardiovascular;  Laterality: N/A;   PROCTOSCOPY N/A 12/26/2017   Procedure: PROCTOSCOPY;  Surgeon: Leighton Ruff, MD;  Location[facility];  Service: General;  Laterality: N/A;   [place]   Social History   Occupational History   Occupation: Chairman    Comment: [employer]  Tobacco Use   Smoking status: Former Smoker    Quit date: 07/10/1981    Years since quitting: 38.0   Smokeless tobacco: Never Used  Substance and Sexual Activity   Alcohol use: No   Drug use: No   Sexual activity: Not on file

## 2019-07-31 NOTE — Progress Notes (Signed)
[facility] OFFICE PROGRESS NOTE   Diagnosis: Non-small cell lung cancer  INTERVAL HISTORY:   [name] returns for a scheduled visit.  He continues to have "congestion" with an intermittent cough.  No fever or dyspnea.  No dysphagia.  Good appetite.  He continues to go about his activities at home.  Leg swelling has improved after an increase in the furosemide dose.  He complains of dryness and superficial desquamation of the lower legs and feet.  He has pain at the soles with ambulation.  Objective:  Vital signs in last 24 hours:  Blood pressure 115/82, pulse 73, temperature 98.5 F (36.9 C), temperature source Temporal, resp. rate 16, height '5\' 11"'$  (1.803 m), weight 145 lb 14.4 oz (66.2 kg), SpO2 98 %.    HEENT: Neck without mass Lymphatics: No cervical, supraclavicular, or axillary nodes Resp: Lungs clear bilaterally, no respiratory stress Cardio: Regular rate and rhythm GI: No hepatosplenomegaly Vascular: No leg edema  Skin: Mild hyperpigmentation and dry flaking in the anterior chest, dryness, superficial desquamation of the lower legs bilaterally, skin thickening and dryness of the soles    Lab Results:  Lab Results  Component Value Date   WBC 5.3 07/28/2019   HGB 11.0 (L) 07/28/2019   HCT 35.2 (L) 07/28/2019   MCV 91.7 07/28/2019   PLT 199 07/28/2019   NEUTROABS 3.9 07/28/2019    CMP  Lab Results  Component Value Date   NA 141 07/28/2019   K 4.1 07/28/2019   CL 106 07/28/2019   CO2 28 07/28/2019   GLUCOSE 143 (H) 07/28/2019   BUN 24 (H) 07/28/2019   CREATININE 1.08 07/28/2019   CALCIUM 8.5 (L) 07/28/2019   PROT 6.4 (L) 07/28/2019   ALBUMIN 2.8 (L) 07/28/2019   AST 15 07/28/2019   ALT 11 07/28/2019   ALKPHOS 108 07/28/2019   BILITOT 0.6 07/28/2019   GFRNONAA >60 07/28/2019   GFRAA >60 07/28/2019    Lab Results  Component Value Date   CEA1 1.64 11/12/2018     Medications: I have reviewed the patient's current  medications.   Assessment/Plan:  1. Sigmoid colon cancer, stage II (T3N0), status post a sigmoidectomy 12/26/2017 ? Tumor involving the sigmoid colon and proximal rectum ? MSI-stable, no loss of mismatch repair protein expression ? Staging CTs of the chest, abdomen, and pelvis on 11/20/2017-focal wall thickening at the distal sigmoid colon, no evidence of metastatic disease, indeterminate 1.9 cm sub-solid left upper lobe nodule 2. Coronary artery disease 3. Atrial fibrillation 4. BPH 5. Left arm/hand intention tremor 6. Report of balance difficulty 7. COPD 8. Indeterminate left upper lobe nodule on the staging chest CT 11/20/2017  Increase in part solid nodule in the left upper lobe, stable groundglass nodule in the superior segment of the left lower lobe  CT biopsy- adenocarcinoma consistent with lung primary, MSS, tumor mutation burden 13, BRAF [id number], PDL-1 TPS 5%. No EGFR, ALK, ERBB2, ROS alteratation  SBRT 08/14/2018-08/23/2018, 5 fractions  CT chest 10/23/2018 -2.6 x 1.7 cm left upper lobe nodule-enlarged, 13 mm left suprahilar node-possibly new, 9 mm AP window node previously 6 mm  CT chest 01/23/2019-new mediastinal and progressive hilar adenopathy,  PET scan 02/03/2019-hypermetabolic mediastinal and left hilar lymph nodes, hypermetabolic left upper lobe mass, no evidence of distant metastatic disease  MRI brain 02/12/2019-no metastatic disease  Radiation beginning 02/24/2019  Cycle 1 weekly Taxol/carboplatin 02/24/2019  Cycle 2 weekly Taxol/carboplatin 03/04/2019  Cycle 3 weekly Taxol/carboplatin 03/18/2019 (Taxol and carboplatin dose reduced secondary to  neutropenia following cycle 2)  Cycle 4 weekly Taxol/carboplatin 03/31/2019  Radiation completed 04/07/2019  CT 06/26/2019-new with increased consolidative change in the left upper lobe and left perihilar region, increase in size of hilar and superior mediastinal nodes, new small left pleural effusion, no evidence of metastatic  disease in the abdomen or pelvis, subtle inferior endplate deformity at L1 9. History of renal insufficiency 10.Odynophagia secondary to radiation esophagitis-resolved 11.  Cough/dyspnea 05/21/2019-treated with doxycycline, and prednisone for radiation pneumonitis   Disposition: Mr. Girgenti has recovered from most of the toxicity of the chemotherapy/radiation.  The chronic "congestion" and intermittent cough may be related to underlying COPD.  I have a low suspicion for pneumonia or radiation pneumonitis.  He will have a chest x-ray today. The leg edema has improved significantly compared to when I saw him last month.  He will continue the current medical regimen as prescribed by cardiology.  Mr. Reister will return for an office visit in 3 months.  I encouraged him to obtain a COVID-19 vaccine.  He plans to follow-up with dermatology and orthopedics to evaluate the lower extremity skin changes and foot pain.  Betsy Coder, MD  07/31/2019  2:08 PM

## 2019-08-01 ENCOUNTER — Telehealth: Payer: Self-pay | Admitting: *Deleted

## 2019-08-01 NOTE — Telephone Encounter (Signed)
Patient notified of CXR results and to call for fever, increased shortness of breath or cough

## 2019-08-01 NOTE — Telephone Encounter (Signed)
-----   Message from Ladell Pier, MD sent at 08/01/2019  8:21 AM EST ----- Please call patient, chest x-ray shows radiation changes in the left upper lung, call for fever, increased cough, or shortness of breath  Follow-up as scheduled

## 2019-08-04 ENCOUNTER — Telehealth: Payer: Self-pay | Admitting: Oncology

## 2019-08-04 NOTE — Telephone Encounter (Signed)
Scheduled per los. Called and spoke with patient. Confirmed appt 

## 2019-08-28 ENCOUNTER — Ambulatory Visit: Payer: Medicare Other | Admitting: Orthopedic Surgery

## 2019-09-03 ENCOUNTER — Ambulatory Visit: Payer: Medicare Other | Admitting: Family

## 2019-09-04 ENCOUNTER — Other Ambulatory Visit: Payer: Self-pay

## 2019-09-04 ENCOUNTER — Ambulatory Visit (INDEPENDENT_AMBULATORY_CARE_PROVIDER_SITE_OTHER): Payer: Medicare Other | Admitting: Orthopedic Surgery

## 2019-09-04 VITALS — Ht 71.0 in | Wt 145.0 lb

## 2019-09-04 DIAGNOSIS — M6702 Short Achilles tendon (acquired), left ankle: Secondary | ICD-10-CM | POA: Diagnosis not present

## 2019-09-11 ENCOUNTER — Encounter: Payer: Self-pay | Admitting: Orthopedic Surgery

## 2019-09-11 NOTE — Progress Notes (Signed)
Office Visit Note   Patient: [name]           Date of Birth: 12-15-1929           MRN: [id number] Visit Date: 09/04/2019              Requested by: Marton Redwood, MD [address] [place],  [place] [address] PCP: Marton Redwood, MD  Chief Complaint  Patient presents with  . Right Foot - Follow-up  . Left Foot - Follow-up      HPI: Patient is an 86 year old gentleman who complains of a ulcer beneath the left foot fifth metatarsal head.  Patient states he was doing some exercises and noticed the ulcer.  Assessment & Plan: Visit Diagnoses: No diagnosis found.  Plan: Patient was given instruction and demonstrated Achilles stretching to do 5 times a day a minute at a time recommended a stiff soled sneaker and orthotic with arch support.  Follow-Up Instructions: Return if symptoms worsen or fail to improve.   Ortho Exam  Patient is alert, oriented, no adenopathy, well-dressed, normal affect, normal respiratory effort. Examination patient has good pulses.  He has dorsiflexion about 10 degrees short of neutral with his knee extended he has a good dorsalis pedis pulse there is an ulcer beneath the fifth metatarsal head this does not probe to bone or tendon.  There is no cellulitis no drainage the ulcer is about 5 mm in diameter and 0.1 mm deep.  Imaging: No results found. No images are attached to the encounter.  Labs: Lab Results  Component Value Date   HGBA1C 6.0 02/14/2011   HGBA1C 5.9 11/09/2009   HGBA1C 6.0 12/14/2008   REPTSTATUS 05/06/2019 FINAL 05/05/2019   CULT (A) 05/05/2019    <10,000 COLONIES/mL INSIGNIFICANT GROWTH Performed at [facility], [place] [address], [place], [place] [address]      Lab Results  Component Value Date   ALBUMIN 2.8 (L) 07/28/2019   ALBUMIN 2.5 (L) 05/21/2019   ALBUMIN 3.6 03/31/2019    Lab Results  Component Value Date   MG 1.8 07/14/2018   MG 2.2 08/12/2017   No results found for: VD25OH  No results found for:  PREALBUMIN CBC EXTENDED Latest Ref Rng & Units 07/28/2019 07/02/2019 05/21/2019  WBC 4.0 - 10.5 K/uL 5.3 8.4 5.4  RBC 4.22 - 5.81 MIL/uL 3.84(L) 3.80(L) 3.10(L)  HGB 13.0 - 17.0 g/dL 11.0(L) 11.0(L) 9.1(L)  HCT 39.0 - 52.0 % 35.2(L) 34.3(L) 28.8(L)  PLT 150 - 400 K/uL 199 343 244  NEUTROABS 1.7 - 7.7 K/uL 3.9 - 3.9  LYMPHSABS 0.7 - 4.0 K/uL 0.8 - 0.6(L)     Body mass index is 20.22 kg/m.  Orders:  No orders of the defined types were placed in this encounter.  No orders of the defined types were placed in this encounter.    Procedures: No procedures performed  Clinical Data: No additional findings.  ROS:  All other systems negative, except as noted in the HPI. Review of Systems  Objective: Vital Signs: Ht 5\' 11"  (1.803 m)   Wt 145 lb (65.8 kg)   BMI 20.22 kg/m   Specialty Comments:  No specialty comments available.  PMFS History: Patient Active Problem List   Diagnosis Date Noted  . Pain due to onychomycosis of toenails of both feet 07/09/2019  . Plantar flexed metatarsal bone of left foot 07/09/2019  . Pre-ulcerative calluses 07/09/2019  . Malignant neoplasm of bronchus of left upper lobe ([place]) 07/31/2018  .  Left upper lobe pulmonary nodule   . Acute respiratory failure with hypoxia ([place]) 07/12/2018  . Chest tube in place   . Pneumothorax of left lung after biopsy 07/09/2018  . Lung nodule 07/09/2018  . Vitamin B12 deficiency 12/29/2017  . Cancer of sigmoid colon s/p sigmoid colectomy 12/26/2017 12/26/2017  . Long term (current) use of anticoagulants [Z79.01] 08/15/2017  . Ischemic cardiomyopathy 08/13/2017  . Hypertension 08/13/2017  . Atrial fibrillation ([place]) 08/08/2017  . Hyperlipidemia 11/09/2009  . LBBB 11/09/2009  . Coronary atherosclerosis 10/05/2008  . DIZZINESS 10/05/2008  . CHEST PAIN-UNSPECIFIED 10/05/2008   Past Medical History:  Diagnosis Date  . A-fib ([place])   . Arthritis   . BPH (benign prostatic hyperplasia)   . Cancer [facility])     colon cancer   . Chest pain, unspecified   . Chronic kidney disease   . Coronary atherosclerosis of unspecified type of vessel, native or graft   . Dizziness and giddiness   . Hyperlipidemia   . Hypertension   . Lung cancer ([place])    bx's done 08/09/2017  . Myocardial infarction ([place]) 12 years ago   x2  . Non-ST elevation (NSTEMI) myocardial infarction ([place]) 08/2017   dx afib,replaced stent  . Peripheral neuropathy   . PVD (peripheral vascular disease) ([place])   . Shortness of breath    exertion  . Vitamin B12 deficiency     Family History  Problem Relation Age of Onset  . Heart attack Father 75  . Arthritis Sister   . Hypothyroidism Daughter   . Colon cancer Neg Hx   . Esophageal cancer Neg Hx   . Rectal cancer Neg Hx   . Stomach cancer Neg Hx     Past Surgical History:  Procedure Laterality Date  . COLON SURGERY     partial colectomy Dr. Marcello Moores 12-14-17  . CORONARY ANGIOPLASTY     4 stents  . CORONARY STENT INTERVENTION N/A 08/09/2017   Procedure: CORONARY STENT INTERVENTION;  Surgeon: Nelva Bush, MD;  Location: [facility];  Service: Cardiovascular;  Laterality: N/A;  . FLEXIBLE SIGMOIDOSCOPY N/A 11/12/2017   Procedure: FLEXIBLE SIGMOIDOSCOPY;  Surgeon: Irene Shipper, MD;  [facility];  Service: Endoscopy;  Laterality: N/A;  . HERNIA REPAIR     right inguinal  . INGUINAL HERNIA REPAIR  06/14/2012   Procedure: HERNIA REPAIR INGUINAL ADULT;  Surgeon: Odis Hollingshead, MD;  Location: [facility];  Service: General;  Laterality: Right;  . INSERTION OF MESH  06/14/2012   Procedure: INSERTION OF MESH;  Surgeon: Odis Hollingshead, MD;  Location: [facility];  Service: General;  Laterality: Right;  . LAPAROSCOPIC PARTIAL COLECTOMY N/A 12/26/2017   Procedure: LAPAROSCOPIC PARTIAL COLECTOMY ERAS PATHWAY;  Surgeon: Leighton Ruff, MD;  [facility];  Service: General;  Laterality: N/A;  . LEFT HEART CATH AND CORONARY ANGIOGRAPHY N/A 08/09/2017   Procedure: LEFT HEART CATH  AND CORONARY ANGIOGRAPHY;  Surgeon: Nelva Bush, MD;  Location: [facility];  Service: Cardiovascular;  Laterality: N/A;  . PROCTOSCOPY N/A 12/26/2017   Procedure: PROCTOSCOPY;  Surgeon: Leighton Ruff, MD;  [facility];  Service: General;  Laterality: N/A;  . [place]   Social History   Occupational History  . Occupation: Magazine features editor    Comment: [employer]  Tobacco Use  . Smoking status: Former Smoker    Quit date: 07/10/1981    Years since quitting: 38.1  . Smokeless tobacco: Never Used  Substance and Sexual Activity  . Alcohol use: No  . Drug use: No  . Sexual activity: Not on file

## 2019-09-19 DIAGNOSIS — E538 Deficiency of other specified B group vitamins: Secondary | ICD-10-CM | POA: Diagnosis not present

## 2019-09-19 DIAGNOSIS — Z125 Encounter for screening for malignant neoplasm of prostate: Secondary | ICD-10-CM | POA: Diagnosis not present

## 2019-09-19 DIAGNOSIS — R7301 Impaired fasting glucose: Secondary | ICD-10-CM | POA: Diagnosis not present

## 2019-09-19 DIAGNOSIS — E7849 Other hyperlipidemia: Secondary | ICD-10-CM | POA: Diagnosis not present

## 2019-09-19 DIAGNOSIS — M859 Disorder of bone density and structure, unspecified: Secondary | ICD-10-CM | POA: Diagnosis not present

## 2019-09-23 DIAGNOSIS — C771 Secondary and unspecified malignant neoplasm of intrathoracic lymph nodes: Secondary | ICD-10-CM | POA: Diagnosis not present

## 2019-09-23 DIAGNOSIS — D6869 Other thrombophilia: Secondary | ICD-10-CM | POA: Diagnosis not present

## 2019-09-23 DIAGNOSIS — I252 Old myocardial infarction: Secondary | ICD-10-CM | POA: Diagnosis not present

## 2019-09-23 DIAGNOSIS — I10 Essential (primary) hypertension: Secondary | ICD-10-CM | POA: Diagnosis not present

## 2019-09-23 DIAGNOSIS — Z0000 Encounter for general adult medical examination without abnormal findings: Secondary | ICD-10-CM | POA: Diagnosis not present

## 2019-09-23 DIAGNOSIS — C3412 Malignant neoplasm of upper lobe, left bronchus or lung: Secondary | ICD-10-CM | POA: Diagnosis not present

## 2019-09-23 DIAGNOSIS — E785 Hyperlipidemia, unspecified: Secondary | ICD-10-CM | POA: Diagnosis not present

## 2019-09-23 DIAGNOSIS — R05 Cough: Secondary | ICD-10-CM | POA: Diagnosis not present

## 2019-09-23 DIAGNOSIS — Z1331 Encounter for screening for depression: Secondary | ICD-10-CM | POA: Diagnosis not present

## 2019-09-23 DIAGNOSIS — R7301 Impaired fasting glucose: Secondary | ICD-10-CM | POA: Diagnosis not present

## 2019-09-23 DIAGNOSIS — I255 Ischemic cardiomyopathy: Secondary | ICD-10-CM | POA: Diagnosis not present

## 2019-09-23 DIAGNOSIS — Z9861 Coronary angioplasty status: Secondary | ICD-10-CM | POA: Diagnosis not present

## 2019-09-23 DIAGNOSIS — I480 Paroxysmal atrial fibrillation: Secondary | ICD-10-CM | POA: Diagnosis not present

## 2019-09-24 DIAGNOSIS — I10 Essential (primary) hypertension: Secondary | ICD-10-CM | POA: Diagnosis not present

## 2019-09-24 DIAGNOSIS — R82998 Other abnormal findings in urine: Secondary | ICD-10-CM | POA: Diagnosis not present

## 2019-10-03 DIAGNOSIS — Z1212 Encounter for screening for malignant neoplasm of rectum: Secondary | ICD-10-CM | POA: Diagnosis not present

## 2019-10-13 ENCOUNTER — Ambulatory Visit (INDEPENDENT_AMBULATORY_CARE_PROVIDER_SITE_OTHER): Payer: Medicare Other | Admitting: Cardiovascular Disease

## 2019-10-13 ENCOUNTER — Encounter: Payer: Self-pay | Admitting: Cardiovascular Disease

## 2019-10-13 ENCOUNTER — Other Ambulatory Visit: Payer: Self-pay

## 2019-10-13 VITALS — BP 102/60 | HR 67 | Ht 71.0 in | Wt 147.0 lb

## 2019-10-13 DIAGNOSIS — L853 Xerosis cutis: Secondary | ICD-10-CM | POA: Diagnosis not present

## 2019-10-13 DIAGNOSIS — I10 Essential (primary) hypertension: Secondary | ICD-10-CM | POA: Diagnosis not present

## 2019-10-13 DIAGNOSIS — I2510 Atherosclerotic heart disease of native coronary artery without angina pectoris: Secondary | ICD-10-CM

## 2019-10-13 DIAGNOSIS — I5032 Chronic diastolic (congestive) heart failure: Secondary | ICD-10-CM | POA: Diagnosis not present

## 2019-10-13 DIAGNOSIS — I255 Ischemic cardiomyopathy: Secondary | ICD-10-CM

## 2019-10-13 DIAGNOSIS — L812 Freckles: Secondary | ICD-10-CM | POA: Diagnosis not present

## 2019-10-13 DIAGNOSIS — Z85828 Personal history of other malignant neoplasm of skin: Secondary | ICD-10-CM | POA: Diagnosis not present

## 2019-10-13 DIAGNOSIS — L821 Other seborrheic keratosis: Secondary | ICD-10-CM | POA: Diagnosis not present

## 2019-10-13 DIAGNOSIS — I480 Paroxysmal atrial fibrillation: Secondary | ICD-10-CM | POA: Diagnosis not present

## 2019-10-13 DIAGNOSIS — L218 Other seborrheic dermatitis: Secondary | ICD-10-CM | POA: Diagnosis not present

## 2019-10-13 NOTE — Progress Notes (Signed)
Chief Complaint  Patient presents with  . Follow-up    CAD    History of Present Illness: 86 yo male with history of atrial fibrillation, ischemic cardiomyopathy, CAD, HTN, PVCs and colon cancer who is here today for follow up. First cardiac stents placed in the Circumflex and LAD in 2007. The stents were Taxus drug eluting stents. LV function was normal at that time. I saw him in July 2018 and he had c/o leg weakness, dizziness. He had one fall due to sudden onset of weakness in his left leg. 48 hour cardiac monitor showed sinus bradycardia with PVCs, PACs and a short run of a wide complex tachycardia. Echo 01/25/17 with normal LV systolic function, no significant valve disease. He was admitted to [facility] 08/08/17 with a NSTEMI and was found to have severe disease in the LAD and Circumflex, both treated with drug eluting stents. (2 drug eluting stents placed in the Circumflex and one drug eluting stent placed in the LAD). He was also found to have atrial fibrillation and has been on Eliquis. Echo September 2019 with LVEF=60%, no significant valve disease. He had rectal bleeding and was found to have a mass in the sigmoid colon that turned out to be adenocarcinoma. He had a laparoscopic sigmoid colectomy in June 2019. He is being followed by Dr. Benay Spice in the Oncology office. Admitted to [facility] January 2020 after lung biopsy on left upper lobe nodule resulted in a pneumothorax requiring a chest tube placement. 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 He has had chemotherapy and radiation and has completed his therapy. He was seen in our office 07/02/19 by Cecilie Kicks, NP with c/o worsened LE edema. He was taking Lasix 20 mg daily. Venous dopplers 07/02/19 with no evidence of DVT. Lasix was increased to 20 mg po BID for several days. His swelling improved with diuresis. Echo January 2021 with LVEF=55%. No significant valve disease.   He is here today for follow up. The patient denies any chest pain,  dyspnea, palpitations, lower extremity edema, orthopnea, PND, dizziness, near syncope or syncope. Only complaint is with his balance. He is still very active.    Primary Care Physician: Marton Redwood, MD  Past Medical History:  Diagnosis Date  . A-fib ([place])   . Arthritis   . BPH (benign prostatic hyperplasia)   . Cancer [facility])    colon cancer   . Chest pain, unspecified   . Chronic kidney disease   . Coronary atherosclerosis of unspecified type of vessel, native or graft   . Dizziness and giddiness   . Hyperlipidemia   . Hypertension   . Lung cancer ([place])    bx's done 08/09/2017  . Myocardial infarction ([place]) 12 years ago   x2  . Non-ST elevation (NSTEMI) myocardial infarction ([place]) 08/2017   dx afib,replaced stent  . Peripheral neuropathy   . PVD (peripheral vascular disease) ([place])   . Shortness of breath    exertion  . Vitamin B12 deficiency     Past Surgical History:  Procedure Laterality Date  . COLON SURGERY     partial colectomy Dr. Marcello Moores 12-14-17  . CORONARY ANGIOPLASTY     4 stents  . CORONARY STENT INTERVENTION N/A 08/09/2017   Procedure: CORONARY STENT INTERVENTION;  Surgeon: Nelva Bush, MD;  Location: [place] [facility];  Service: Cardiovascular;  Laterality: N/A;  . FLEXIBLE SIGMOIDOSCOPY N/A 11/12/2017   Procedure: FLEXIBLE SIGMOIDOSCOPY;  Surgeon: Irene Shipper, MD;  Location: [facility];  Service: Endoscopy;  Laterality: N/A;  . HERNIA REPAIR     right inguinal  . INGUINAL HERNIA REPAIR  06/14/2012   Procedure: HERNIA REPAIR INGUINAL ADULT;  Surgeon: Odis Hollingshead, MD;  Location: [place];  Service: General;  Laterality: Right;  . INSERTION OF MESH  06/14/2012   Procedure: INSERTION OF MESH;  Surgeon: Odis Hollingshead, MD;  Location: [place];  Service: General;  Laterality: Right;  . LAPAROSCOPIC PARTIAL COLECTOMY N/A 12/26/2017   Procedure: LAPAROSCOPIC PARTIAL COLECTOMY ERAS PATHWAY;  Surgeon: Leighton Ruff, MD;  Location: [facility];  Service: General;   Laterality: N/A;  . LEFT HEART CATH AND CORONARY ANGIOGRAPHY N/A 08/09/2017   Procedure: LEFT HEART CATH AND CORONARY ANGIOGRAPHY;  Surgeon: Nelva Bush, MD;  Location: [place] [facility];  Service: Cardiovascular;  Laterality: N/A;  . PROCTOSCOPY N/A 12/26/2017   Procedure: PROCTOSCOPY;  Surgeon: Leighton Ruff, MD;  Location: [facility];  Service: General;  Laterality: N/A;  . SINUS SURGERY WITH INSTATRAK  1985    Current Outpatient Medications  Medication Sig Dispense Refill  . albuterol (VENTOLIN HFA) 108 (90 Base) MCG/ACT inhaler Inhale 2 puffs into the lungs every 6 (six) hours as needed.    Marland Kitchen apixaban (ELIQUIS) 5 MG TABS tablet Take 1 tablet (5 mg total) by mouth 2 (two) times daily. 60 tablet 0  . aspirin EC 81 MG tablet Take 1 tablet (81 mg total) by mouth daily. 90 tablet 3  . atorvastatin (LIPITOR) 20 MG tablet Take 20 mg by mouth daily.    . calcitonin, salmon, (MIACALCIN/FORTICAL) 200 UNIT/ACT nasal spray 1 spray daily.    . finasteride (PROSCAR) 5 MG tablet Take 5 mg by mouth at bedtime.    . furosemide (LASIX) 20 MG tablet Take 1 tablet (20 mg total) by mouth 2 (two) times daily. 180 tablet 3  . metoprolol succinate (TOPROL-XL) 25 MG 24 hr tablet Take 3 tablets (75 mg total) by mouth daily. 90 tablet 9  . potassium chloride (KLOR-CON) 10 MEQ tablet Take 10 mEq by mouth daily.    . Probiotic Product (PROBIOTIC DAILY PO) Take 1 tablet by mouth daily.    . tamsulosin (FLOMAX) 0.4 MG CAPS capsule Take 0.4 mg by mouth at bedtime.    . vitamin B-12 (CYANOCOBALAMIN) 1000 MCG tablet Take 1,000 mcg by mouth daily.     No current facility-administered medications for this visit.    Allergies  Allergen Reactions  . Gadolinium Derivatives Hives, Itching and Other (See Comments)    Dr. Jeralyn Ruths s/w Mr. Beckles. We observed him for 15 minutes. He did not need to take Benadryl. The symptoms began to subside before he left.     Social History   Socioeconomic History  . Marital status:  Divorced    Spouse name: Not on file  . Number of children: 3  . Years of education: Not on file  . Highest education level: Not on file  Occupational History  . Occupation: Magazine features editor    Comment: [place]  Tobacco Use  . Smoking status: Former Smoker    Quit date: 07/10/1981    Years since quitting: 38.2  . Smokeless tobacco: Never Used  Substance and Sexual Activity  . Alcohol use: No  . Drug use: No  . Sexual activity: Not on file  Other Topics Concern  . Not on file  Social History Narrative   Regular exercise         Social Determinants of Health  Financial Resource Strain:   . Difficulty of Paying Living Expenses:   Food Insecurity:   . Worried About Charity fundraiser in the Last Year:   . Arboriculturist in the Last Year:   Transportation Needs:   . Film/video editor (Medical):   Marland Kitchen Lack of Transportation (Non-Medical):   Physical Activity:   . Days of Exercise per Week:   . Minutes of Exercise per Session:   Stress:   . Feeling of Stress :   Social Connections:   . Frequency of Communication with Friends and Family:   . Frequency of Social Gatherings with Friends and Family:   . Attends Religious Services:   . Active Member of Clubs or Organizations:   . Attends Archivist Meetings:   Marland Kitchen Marital Status:   Intimate Partner Violence:   . Fear of Current or Ex-Partner:   . Emotionally Abused:   Marland Kitchen Physically Abused:   . Sexually Abused:     Family History  Problem Relation Age of Onset  . Heart attack Father 2  . Arthritis Sister   . Hypothyroidism Daughter   . Colon cancer Neg Hx   . Esophageal cancer Neg Hx   . Rectal cancer Neg Hx   . Stomach cancer Neg Hx     Review of Systems:  As stated in the HPI and otherwise negative.   BP 102/60   Pulse 67   Ht 5\' 11"  (1.803 m)   Wt 147 lb (66.7 kg)   SpO2 97%   BMI 20.50 kg/m   Physical Examination:  General: Well developed, well nourished, NAD  HEENT: OP  clear, mucus membranes moist  SKIN: warm, dry. No rashes. Neuro: No focal deficits  Musculoskeletal: Muscle strength 5/5 all ext  Psychiatric: Mood and affect normal  Neck: No JVD, no carotid bruits, no thyromegaly, no lymphadenopathy.  Lungs:Clear bilaterally, no wheezes, rhonci, crackles Cardiovascular: Regular rate and rhythm. No murmurs, gallops or rubs. Abdomen:Soft. Bowel sounds present. Non-tender.  Extremities: No lower extremity edema. Pulses are 2 + in the bilateral DP/PT.  Echo September 2019: - Left ventricle: The cavity size was normal. Wall thickness was   normal. Systolic function was normal. The estimated ejection   fraction was in the range of 60% to 65%. Wall motion was normal;   there were no regional wall motion abnormalities. Features are   consistent with a pseudonormal left ventricular filling pattern,   with concomitant abnormal relaxation and increased filling   pressure (grade 2 diastolic dysfunction). - Aortic valve: There was trivial regurgitation.  Cardiac cath 08/09/17: 1. Severe 2-vessel coronary artery disease, including 80% in-stent restenosis of the mid LAD with disease extending beyond the distal stent edge, as well as 70% ostial/proximal LCx disease followed by multifocal mid and distal LCx in-stent restenosis of up to 99%. 2. Moderate, non-obstructive RCA disease. 3. Normal left ventricular filling pressure. 4. Successful PCI to mid LAD with placement of Resolute Onyx 2.75 x 34 mm drug eluting stent (post-dilated to 3.1 mm) with 0% residual stenosis and TIMI-3 flow. 5. Successful PCI to LCx with placement of Resolute Onyx drug-eluting stents extending from the ostium to the proximal segment of old stent (2.5 x 18 mm) and covering the distal 1/3 of old stent extending into the distal LCx (2.0 x 30 mm) with 0% residual stenosis and TIMI-3 flow.  EKG:  EKG is not ordered today. The ekg ordered today demonstrates   Recent Labs: 07/02/2019: NT-Pro BNP  3,310 07/28/2019: ALT 11; BUN 24; Creatinine 1.08; Hemoglobin 11.0; Platelet Count 199; Potassium 4.1; Sodium 141   Lipid Panel    Component Value Date/Time   CHOL 160 08/09/2017 0359   TRIG 74 08/09/2017 0359   HDL 41 08/09/2017 0359   CHOLHDL 3.9 08/09/2017 0359   VLDL 15 08/09/2017 0359   LDLCALC 104 (H) 08/09/2017 0359     Wt Readings from Last 3 Encounters:  10/13/19 147 lb (66.7 kg)  09/04/19 145 lb (65.8 kg)  07/31/19 145 lb (65.8 kg)     Other studies Reviewed: Additional studies/ records that were reviewed today include: . Review of the above records demonstrates:    Assessment and Plan:   1. CAD without angina: No chest pain. Continue ASA, statin and beta blocker.     2. Atrial fibrillation, paroxysmal: Sinus today. Continue beta blocker and Eliquis.   3. HTN: BP is controlled. Continue current therapy  4. Ischemic cardiomyopathy: LVEF=60% by echo September 2019. Continue beta blocker. Ace-inh stopped in September 2020 due to hypotension.    5. Colon cancer/Lung cancer: He is s/p sigmoid colectomy. Now done with chemo and radiation. He is being followed by Dr. Benay Spice   6. Chronic diastolic CHF:  Weight stable. LE edema is resolved. Continue Lasix 20 mg every day. He will follow daily weights and use extra lasix as needed.   Current medicines are reviewed at length with the patient today.  The patient does not have concerns regarding medicines.  The following changes have been made:  no change  Labs/ tests ordered today include:   No orders of the defined types were placed in this encounter.   Disposition:   FU with me in 6 months.   Signed, Lauree Chandler, MD 10/13/2019 10:41 AM    [facility] [address], [place], [place]  [address] Phone: [phone]; Fax: [phone]

## 2019-10-13 NOTE — Patient Instructions (Signed)
Medication Instructions:  Your physician recommends that you continue on your current medications as directed. Please refer to the Current Medication list given to you today.  *If you need a refill on your cardiac medications before your next appointment, please call your pharmacy*   Lab Work: None If you have labs (blood work) drawn today and your tests are completely normal, you will receive your results only by: Marland Kitchen MyChart Message (if you have MyChart) OR . A paper copy in the mail If you have any lab test that is abnormal or we need to change your treatment, we will call you to review the results.   Testing/Procedures: None   Follow-Up: At [facility], you and your health needs are our priority.  As part of our continuing mission to provide you with exceptional heart care, we have created designated Provider Care Teams.  These Care Teams include your primary Cardiologist (physician) and Advanced Practice Providers (APPs -  Physician Assistants and Nurse Practitioners) who all work together to provide you with the care you need, when you need it.  We recommend signing up for the patient portal called "MyChart".  Sign up information is provided on this After Visit Summary.  MyChart is used to connect with patients for Virtual Visits (Telemedicine).  Patients are able to view lab/test results, encounter notes, upcoming appointments, etc.  Non-urgent messages can be sent to your provider as well.   To learn more about what you can do with MyChart, go to [email or web address].    Your next appointment:   6 month(s)  The format for your next appointment:   In Person  Provider:   You may see Lauree Chandler, MD or one of the following Advanced Practice Providers on your designated Care Team:    Melina Copa, PA-C  Ermalinda Barrios, PA-C    Other Instructions

## 2019-10-28 ENCOUNTER — Other Ambulatory Visit: Payer: Self-pay

## 2019-10-28 ENCOUNTER — Inpatient Hospital Stay: Payer: Medicare Other | Attending: Oncology | Admitting: Oncology

## 2019-10-28 VITALS — BP 112/77 | HR 70 | Temp 99.1°F | Resp 17 | Ht 71.0 in | Wt 148.3 lb

## 2019-10-28 DIAGNOSIS — N400 Enlarged prostate without lower urinary tract symptoms: Secondary | ICD-10-CM | POA: Diagnosis not present

## 2019-10-28 DIAGNOSIS — G252 Other specified forms of tremor: Secondary | ICD-10-CM | POA: Insufficient documentation

## 2019-10-28 DIAGNOSIS — Z85118 Personal history of other malignant neoplasm of bronchus and lung: Secondary | ICD-10-CM | POA: Insufficient documentation

## 2019-10-28 DIAGNOSIS — I255 Ischemic cardiomyopathy: Secondary | ICD-10-CM | POA: Diagnosis not present

## 2019-10-28 DIAGNOSIS — I4891 Unspecified atrial fibrillation: Secondary | ICD-10-CM | POA: Diagnosis not present

## 2019-10-28 DIAGNOSIS — C3412 Malignant neoplasm of upper lobe, left bronchus or lung: Secondary | ICD-10-CM | POA: Diagnosis not present

## 2019-10-28 DIAGNOSIS — J449 Chronic obstructive pulmonary disease, unspecified: Secondary | ICD-10-CM | POA: Insufficient documentation

## 2019-10-28 DIAGNOSIS — I2510 Atherosclerotic heart disease of native coronary artery without angina pectoris: Secondary | ICD-10-CM | POA: Insufficient documentation

## 2019-10-28 NOTE — Progress Notes (Signed)
[facility] OFFICE PROGRESS NOTE   Diagnosis: Non-small cell lung cancer  INTERVAL HISTORY:   Mr. Yepiz returns for a scheduled visit.  He reports decreased mobility, difficulty with balance, and mild exertional dyspnea.  He continues to have chest "congestion".  No dysphagia.  He is very active in the home.  He gets out of the house and drives.  Objective:  Vital signs in last 24 hours:  Blood pressure 112/77, pulse 70, temperature 99.1 F (37.3 C), temperature source Temporal, resp. rate 17, height '5\' 11"'  (1.803 m), weight 148 lb 4.8 oz (67.3 kg), SpO2 95 %.    HEENT: Neck without mass Lymphatics: No cervical, supraclavicular, or axillary nodes Resp: Distant breath sounds, no respiratory distress Cardio: Regular rate and rhythm GI: No hepatosplenomegaly Vascular: No leg edema   Lab Results:  Lab Results  Component Value Date   WBC 5.3 07/28/2019   HGB 11.0 (L) 07/28/2019   HCT 35.2 (L) 07/28/2019   MCV 91.7 07/28/2019   PLT 199 07/28/2019   NEUTROABS 3.9 07/28/2019    CMP  Lab Results  Component Value Date   NA 141 07/28/2019   K 4.1 07/28/2019   CL 106 07/28/2019   CO2 28 07/28/2019   GLUCOSE 143 (H) 07/28/2019   BUN 24 (H) 07/28/2019   CREATININE 1.08 07/28/2019   CALCIUM 8.5 (L) 07/28/2019   PROT 6.4 (L) 07/28/2019   ALBUMIN 2.8 (L) 07/28/2019   AST 15 07/28/2019   ALT 11 07/28/2019   ALKPHOS 108 07/28/2019   BILITOT 0.6 07/28/2019   GFRNONAA >60 07/28/2019   GFRAA >60 07/28/2019    Lab Results  Component Value Date   CEA1 1.64 11/12/2018     Medications: I have reviewed the patient's current medications.   Assessment/Plan: 1. Sigmoid colon cancer, stage II (T3N0), status post a sigmoidectomy 12/26/2017 ? Tumor involving the sigmoid colon and proximal rectum ? MSI-stable, no loss of mismatch repair protein expression ? Staging CTs of the chest, abdomen, and pelvis on 11/20/2017-focal wall thickening at the distal sigmoid  colon, no evidence of metastatic disease, indeterminate 1.9 cm sub-solid left upper lobe nodule 2. Coronary artery disease 3. Atrial fibrillation 4. BPH 5. Left arm/hand intention tremor 6. Report of balance difficulty 7. COPD 8. Indeterminate left upper lobe nodule on the staging chest CT 11/20/2017  Increase in part solid nodule in the left upper lobe, stable groundglass nodule in the superior segment of the left lower lobe  CT biopsy- adenocarcinoma consistent with lung primary, MSS, tumor mutation burden 13, BRAF [id number], PDL-1 TPS 5%. No EGFR, ALK, ERBB2, ROS alteratation  SBRT 08/14/2018-08/23/2018, 5 fractions  CT chest 10/23/2018 -2.6 x 1.7 cm left upper lobe nodule-enlarged, 13 mm left suprahilar node-possibly new, 9 mm AP window node previously 6 mm  CT chest 01/23/2019-new mediastinal and progressive hilar adenopathy,  PET scan 02/03/2019-hypermetabolic mediastinal and left hilar lymph nodes, hypermetabolic left upper lobe mass, no evidence of distant metastatic disease  MRI brain 02/12/2019-no metastatic disease  Radiation beginning 02/24/2019  Cycle 1 weekly Taxol/carboplatin 02/24/2019  Cycle 2 weekly Taxol/carboplatin 03/04/2019  Cycle 3 weekly Taxol/carboplatin 03/18/2019 (Taxol and carboplatin dose reduced secondary to neutropenia following cycle 2)  Cycle 4 weekly Taxol/carboplatin 03/31/2019  Radiation completed 04/07/2019  CT 06/26/2019-new with increased consolidative change in the left upper lobe and left perihilar region, increase in size of hilar and superior mediastinal nodes, new small left pleural effusion, no evidence of metastatic disease in the abdomen or pelvis, subtle  inferior endplate deformity at L1 9. History of renal insufficiency 10.Odynophagia secondary to radiation esophagitis-resolved 11.  Cough/dyspnea 05/21/2019-treated with doxycycline, and prednisone for radiation pneumonitis, improved     Disposition: Mr. Lange appears stable.  There is no  clinical evidence for progression of the non-small cell lung cancer.  I suspect the persistent cough and exertional dyspnea are related to COPD.  He will be scheduled for a restaging chest CT and office visit in 2 months.  I encouraged Mr. Bogacki to obtain the COVID-19 vaccine.  I will asked Dr. Angelena Form to confirm the recommendation for both aspirin and apixaban.  Betsy Coder, MD  10/28/2019  1:02 PM

## 2019-10-29 ENCOUNTER — Telehealth: Payer: Self-pay | Admitting: Oncology

## 2019-10-29 NOTE — Telephone Encounter (Signed)
Scheduled per los. Called, not able to leave msg. Mailed printout  

## 2019-11-25 ENCOUNTER — Emergency Department (HOSPITAL_COMMUNITY)
Admission: EM | Admit: 2019-11-25 | Discharge: 2019-12-09 | Disposition: E | Payer: Medicare Other | Attending: Emergency Medicine | Admitting: Emergency Medicine

## 2019-11-25 DIAGNOSIS — R4020 Unspecified coma: Secondary | ICD-10-CM | POA: Diagnosis not present

## 2019-11-25 DIAGNOSIS — R404 Transient alteration of awareness: Secondary | ICD-10-CM | POA: Diagnosis not present

## 2019-11-25 DIAGNOSIS — I4901 Ventricular fibrillation: Secondary | ICD-10-CM | POA: Diagnosis not present

## 2019-11-25 DIAGNOSIS — I469 Cardiac arrest, cause unspecified: Secondary | ICD-10-CM | POA: Diagnosis not present

## 2019-11-25 DIAGNOSIS — I499 Cardiac arrhythmia, unspecified: Secondary | ICD-10-CM | POA: Diagnosis not present

## 2019-11-25 DIAGNOSIS — R0689 Other abnormalities of breathing: Secondary | ICD-10-CM | POA: Diagnosis not present

## 2019-11-25 MED ORDER — EPINEPHRINE 1 MG/10ML IJ SOSY
PREFILLED_SYRINGE | INTRAMUSCULAR | Status: DC | PRN
  Administered 2019-11-25 (×2): 1 via INTRAVENOUS

## 2019-11-25 MED FILL — Medication: Qty: 1 | Status: AC

## 2019-12-09 NOTE — ED Triage Notes (Signed)
Pt arrives CPR in progress after witnessed arrest in the driveway. Was ambulating alone and felt weak and collapsed, caught by family. On EMS arrival pt bradycardic, and agonal. Pacing initiated and then be became apneic. Pt intubtated. Bystander CPR started. Approx 40 mins CPR, 450 amiodarone, defib x4, and 6 epi given PTA.

## 2019-12-09 NOTE — ED Provider Notes (Signed)
[facility] EMERGENCY DEPARTMENT Provider Note   CSN: [id number] Arrival date & time: 23-Dec-2019  1312     History No chief complaint on file.   [name] is a 86 y.o. male.  Patient is a an 86 year old male with extensive past medical history including lung cancer, coronary artery disease with stent, atrial fibrillation on anticoagulation.  He is brought by EMS after being found in cardiac arrest.  From what I am told the patient walked out of his home to the car of a friend.  He apparently was walking more slowly than normal, then collapsed and became unresponsive on the ground.  I am told CPR was initiated by bystanders, then EMS was called.  EMS arrived to find the patient severely bradycardic and agonal.  He was intubated in the field by EMS who administered appropriate cardiac medications including epinephrine.  He was also cardioverted 4 times for what I was told was V. fib.  He was also given amiodarone during transport.  CPR has been in progress for nearly 25 minutes upon arrival to the ED.  The history is provided by the patient.       Past Medical History:  Diagnosis Date  . A-fib ([place])   . Arthritis   . BPH (benign prostatic hyperplasia)   . Cancer [facility])    colon cancer   . Chest pain, unspecified   . Chronic kidney disease   . Coronary atherosclerosis of unspecified type of vessel, native or graft   . Dizziness and giddiness   . Hyperlipidemia   . Hypertension   . Lung cancer ([place])    bx's done 08/09/2017  . Myocardial infarction ([place]) 12 years ago   x2  . Non-ST elevation (NSTEMI) myocardial infarction ([place]) 08/2017   dx afib,replaced stent  . Peripheral neuropathy   . PVD (peripheral vascular disease) ([place])   . Shortness of breath    exertion  . Vitamin B12 deficiency     Patient Active Problem List   Diagnosis Date Noted  . Pain due to onychomycosis of toenails of both feet 07/09/2019  . Plantar flexed metatarsal bone of left foot  07/09/2019  . Pre-ulcerative calluses 07/09/2019  . Malignant neoplasm of bronchus of left upper lobe ([place]) 07/31/2018  . Left upper lobe pulmonary nodule   . Acute respiratory failure with hypoxia ([place]) 07/12/2018  . Chest tube in place   . Pneumothorax of left lung after biopsy 07/09/2018  . Lung nodule 07/09/2018  . Vitamin B12 deficiency 12/29/2017  . Cancer of sigmoid colon s/p sigmoid colectomy 12/26/2017 12/26/2017  . Long term (current) use of anticoagulants [Z79.01] 08/15/2017  . Ischemic cardiomyopathy 08/13/2017  . Hypertension 08/13/2017  . Atrial fibrillation ([place]) 08/08/2017  . Hyperlipidemia 11/09/2009  . LBBB 11/09/2009  . Coronary atherosclerosis 10/05/2008  . DIZZINESS 10/05/2008  . CHEST PAIN-UNSPECIFIED 10/05/2008    Past Surgical History:  Procedure Laterality Date  . COLON SURGERY     partial colectomy Dr. Marcello Moores 12-14-17  . CORONARY ANGIOPLASTY     4 stents  . CORONARY STENT INTERVENTION N/A 08/09/2017   Procedure: CORONARY STENT INTERVENTION;  Surgeon: Nelva Bush, MD;  Location: [facility] CV LAB;  Service: Cardiovascular;  Laterality: N/A;  . FLEXIBLE SIGMOIDOSCOPY N/A 11/12/2017   Procedure: FLEXIBLE SIGMOIDOSCOPY;  Surgeon: Irene Shipper, MD;  Location: [facility];  Service: Endoscopy;  Laterality: N/A;  . HERNIA REPAIR     right inguinal  . INGUINAL HERNIA REPAIR  06/14/2012  Procedure: HERNIA REPAIR INGUINAL ADULT;  Surgeon: Odis Hollingshead, MD;  Location: [facility];  Service: General;  Laterality: Right;  . INSERTION OF MESH  06/14/2012   Procedure: INSERTION OF MESH;  Surgeon: Odis Hollingshead, MD;  Location: [facility];  Service: General;  Laterality: Right;  . LAPAROSCOPIC PARTIAL COLECTOMY N/A 12/26/2017   Procedure: LAPAROSCOPIC PARTIAL COLECTOMY ERAS PATHWAY;  Surgeon: Leighton Ruff, MD;  Location: [facility];  Service: General;  Laterality: N/A;  . LEFT HEART CATH AND CORONARY ANGIOGRAPHY N/A 08/09/2017   Procedure: LEFT HEART CATH AND CORONARY  ANGIOGRAPHY;  Surgeon: Nelva Bush, MD;  Location: [facility] CV [facility];  Service: Cardiovascular;  Laterality: N/A;  . PROCTOSCOPY N/A 12/26/2017   Procedure: PROCTOSCOPY;  Surgeon: Leighton Ruff, MD;  Location: [facility];  Service: General;  Laterality: N/A;  . SINUS SURGERY WITH INSTATRAK  1985       Family History  Problem Relation Age of Onset  . Heart attack Father 58  . Arthritis Sister   . Hypothyroidism Daughter   . Colon cancer Neg Hx   . Esophageal cancer Neg Hx   . Rectal cancer Neg Hx   . Stomach cancer Neg Hx     Social History   Tobacco Use  . Smoking status: Former Smoker    Quit date: 07/10/1981    Years since quitting: 38.4  . Smokeless tobacco: Never Used  Substance Use Topics  . Alcohol use: No  . Drug use: No    Home Medications Prior to Admission medications   Medication Sig Start Date End Date Taking? Authorizing Provider  apixaban (ELIQUIS) 5 MG TABS tablet Take 1 tablet (5 mg total) by mouth 2 (two) times daily. 09/25/18   Burnell Blanks, MD  aspirin EC 81 MG tablet Take 1 tablet (81 mg total) by mouth daily. 09/09/18   Burnell Blanks, MD  atorvastatin (LIPITOR) 20 MG tablet Take 20 mg by mouth daily.    [provider]  calcitonin, salmon, (MIACALCIN/FORTICAL) 200 UNIT/ACT nasal spray 1 spray daily. 06/27/19   [provider]  finasteride (PROSCAR) 5 MG tablet Take 5 mg by mouth at bedtime. 04/16/19   [provider]  furosemide (LASIX) 20 MG tablet Take 1 tablet (20 mg total) by mouth 2 (two) times daily. 07/10/19   Burnell Blanks, MD  metoprolol succinate (TOPROL-XL) 25 MG 24 hr tablet Take 3 tablets (75 mg total) by mouth daily. 11/21/18   Burnell Blanks, MD  potassium chloride (KLOR-CON) 10 MEQ tablet Take 10 mEq by mouth daily. 06/27/19   [provider]  Probiotic Product (PROBIOTIC DAILY PO) Take 1 tablet by mouth daily.    [provider]  tamsulosin (FLOMAX) 0.4 MG  CAPS capsule Take 0.4 mg by mouth at bedtime. 04/16/19   [provider]  vitamin B-12 (CYANOCOBALAMIN) 1000 MCG tablet Take 1,000 mcg by mouth daily.    [provider]    Allergies    Gadolinium derivatives  Review of Systems   Review of Systems  Unable to perform ROS: Acuity of condition    Physical Exam Updated Vital Signs Pulse (!) 0   Resp (!) 0   Ht 5\' 11"  (1.803 m)   Wt 65.7 kg   BMI 20.20 kg/m   Physical Exam Vitals and nursing note reviewed.  Constitutional:      Appearance: He is well-developed.     Comments: Patient arrives here critically ill.  Endotracheal tube is in place and  CPR is in progress.  HENT:     Head: Normocephalic and atraumatic.  Cardiovascular:     Comments: No heart sounds are audible and no pulses are palpable in the carotid or femoral arteries. Pulmonary:     Comments: Patient receiving ventilations by Ambu bag.  He is intubated. Musculoskeletal:        General: Normal range of motion.     Cervical back: Normal range of motion and neck supple.  Skin:    General: Skin is warm and dry.  Neurological:     Comments: GCS 3.     ED Results / Procedures / Treatments   Labs (all labs ordered are listed, but only abnormal results are displayed) Labs Reviewed  I-STAT CHEM 8, ED    EKG None  Radiology No results found.  Procedures Procedures (including critical care time)  Medications Ordered in ED Medications  EPINEPHrine (ADRENALIN) 1 MG/10ML injection (1 Syringe Intravenous Given 12-08-19 1317)    ED Course  I have reviewed the triage vital signs and the nursing notes.  Pertinent labs & imaging results that were available during my care of the patient were reviewed by me and considered in my medical decision making (see chart for details).    MDM Rules/Calculators/A&P  Patient arrives here in cardiac arrest with CPR in progress using the [facility] device.  He was intubated in the field and tube placement  appears adequate.  Breath sounds are audible over both lung fields.  CPR was continued and appropriate ACLS medications administered.  After several doses of epinephrine and chest compressions, patient continued in PEA with an agonal rhythm in the 20s.  He is showing no signs of life.  In total, patient received nearly 40 minutes of CPR with no change in his condition.  At this point, patient has been pronounced expired.  I personally informed the patient's son and daughter who were present in the emergency department.  I also sent written notification to the patient's primary doctor, Dr. Brigitte Pulse.  CRITICAL CARE Performed by: Veryl Speak Total critical care time: 35 minutes Critical care time was exclusive of separately billable procedures and treating other patients. Critical care was necessary to treat or prevent imminent or life-threatening deterioration. Critical care was time spent personally by me on the following activities: development of treatment plan with patient and/or surrogate as well as nursing, discussions with consultants, evaluation of patient's response to treatment, examination of patient, obtaining history from patient or surrogate, ordering and performing treatments and interventions, ordering and review of laboratory studies, ordering and review of radiographic studies, pulse oximetry and re-evaluation of patient's condition.  Cardiopulmonary Resuscitation (CPR) Procedure Note Directed/Performed by: Veryl Speak I personally directed ancillary staff and/or performed CPR in an effort to regain return of spontaneous circulation and to maintain cardiac, neuro and systemic perfusion.    Final Clinical Impression(s) / ED Diagnoses Final diagnoses:  None    Rx / DC Orders ED Discharge Orders    None       Veryl Speak, MD 12-08-19 1503

## 2019-12-09 DEATH — deceased

## 2019-12-29 ENCOUNTER — Other Ambulatory Visit: Payer: Medicare Other

## 2019-12-29 ENCOUNTER — Ambulatory Visit: Payer: Medicare Other | Admitting: Nurse Practitioner

## 2020-01-08 DEATH — deceased

## 2020-08-22 IMAGING — PT NM PET TUM IMG INITIAL (PI) SKULL BASE T - THIGH
1 of 8 series · 2 of 25 positions shown · non-contrast
Comparison: None.

CLINICAL DATA: Initial treatment strategy for sigmoid colon
carcinoma..

EXAM:
NUCLEAR MEDICINE PET SKULL BASE TO THIGH
TECHNIQUE: 9.6 mCi F-18 FDG was injected intravenously. Full-ring PET imaging
was performed from the skull base to thigh after the radiotracer. CT
data was obtained and used for attenuation correction and anatomic
localization.
Fasting blood glucose: 99 mg/dl

[Series 4: ct sk_thigh 5.0 b31f · axial · 5.0mm · 0.98mm/px · z∈[-1589,-657]mm · 2 of 234 slices shown]
[im 1/234  brain]
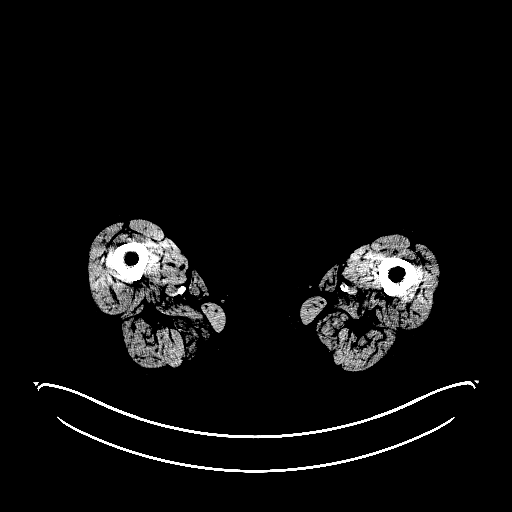
[im 234/234  brain]
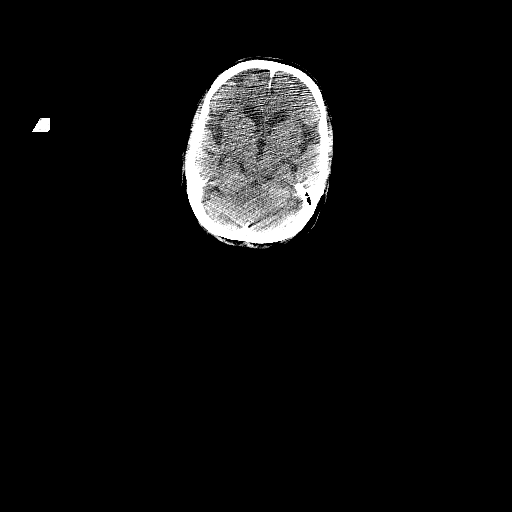

[2 of 25 positions shown; findings below may reference images not displayed]

FINDINGS: Mediastinal blood pool activity: SUV max

NECK: No hypermetabolic lymph nodes in the neck.

Incidental CT findings: none

CHEST: Within the LEFT upper lobe solid and sub solid nodule is
again noted measuring up to 20 mm including the sub solid portion.
This central solid portion measures 11 mm and has associated
metabolic activity (SUV max equal 4.5).

No additional hypermetabolic pulmonary nodules.

No hypermetabolic mediastinal lymph nodes.

Incidental CT findings: Coronary artery calcification and aortic
atherosclerotic calcification.

ABDOMEN/PELVIS: There is intense circumferential metabolic activity
involving the gastric fundus, gastric body and extending to the
gastric antrum. Activity is intense with SUV max equal 14.2. There
is no mass lesion identified on CT portion of exam. There is mild
activity through the distal esophagus from the level the carina to
the GE junction.

No abnormal activity in the liver. No hypermetabolic abdominopelvic
lymph nodes. Symmetric activity in the adrenal glands noted.

Prostate is enlarged.

Anastomosis in the sigmoid colon noted. No evidence obstruction no
metabolic at nodularity. No hypermetabolic pelvic lymph nodes.

Incidental CT findings: none

SKELETON: No focal hypermetabolic activity to suggest skeletal
metastasis.

Incidental CT findings: none
IMPRESSION: 1. Hypermetabolic LEFT upper lobe pulmonary nodule. The solid
portion of this nodule is increased over time. Findings concerning
for BRONCHOGENIC CARCINOMA.
2. Intense radiotracer activity within the gastric fundus and body
which is greater than normal physiologic activity. Differential
would include gastritis versus lymphoma or primary gastric
malignancy. As there is no mass lesion identified, malignancy is
less favored. Recommend upper endoscopy and potentially tissue
sampling.
3. Diffuse activity associated the distal esophagus may relate to
abnormal hypermetabolic stomach.
4. No evidence of local colorectal carcinoma at the sigmoid
anastomosis.
5. No evidence of metastatic disease in the abdomen pelvis.

## 2023-05-30 NOTE — Telephone Encounter (Signed)
Telephone call  

## 2023-05-31 NOTE — Telephone Encounter (Signed)
Telephone call
